# Patient Record
Sex: Female | Born: 1950 | Race: Black or African American | Hispanic: No | Marital: Single | Smoking: Never smoker
Health system: Southern US, Community
[De-identification: ages and names within clinical notes are randomized; demographics above are authoritative.]

## PROBLEM LIST (undated history)

## (undated) DIAGNOSIS — R0989 Other specified symptoms and signs involving the circulatory and respiratory systems: Secondary | ICD-10-CM

## (undated) DIAGNOSIS — E559 Vitamin D deficiency, unspecified: Secondary | ICD-10-CM

## (undated) DIAGNOSIS — I4819 Other persistent atrial fibrillation: Secondary | ICD-10-CM

## (undated) DIAGNOSIS — E0590 Thyrotoxicosis, unspecified without thyrotoxic crisis or storm: Secondary | ICD-10-CM

## (undated) DIAGNOSIS — E079 Disorder of thyroid, unspecified: Secondary | ICD-10-CM

## (undated) DIAGNOSIS — I4891 Unspecified atrial fibrillation: Secondary | ICD-10-CM

## (undated) DIAGNOSIS — I509 Heart failure, unspecified: Secondary | ICD-10-CM

## (undated) DIAGNOSIS — D649 Anemia, unspecified: Secondary | ICD-10-CM

## (undated) DIAGNOSIS — N900 Mild vulvar dysplasia: Secondary | ICD-10-CM

## (undated) HISTORY — DX: Other specified symptoms and signs involving the circulatory and respiratory systems: R09.89

## (undated) HISTORY — DX: Vitamin D deficiency, unspecified: E55.9

## (undated) HISTORY — PX: CATARACT EXTRACTION, BILATERAL: SHX1313

## (undated) HISTORY — PX: TONSILLECTOMY: SUR1361

## (undated) HISTORY — DX: Disorder of thyroid, unspecified: E07.9

## (undated) HISTORY — PX: BREAST SURGERY: SHX581

## (undated) HISTORY — DX: Mild vulvar dysplasia: N90.0

## (undated) HISTORY — PX: BIOPSY THYROID: PRO38

---

## 1995-11-05 HISTORY — PX: ABDOMINAL HYSTERECTOMY: SHX81

## 1999-04-05 DIAGNOSIS — N900 Mild vulvar dysplasia: Secondary | ICD-10-CM

## 1999-04-05 HISTORY — DX: Mild vulvar dysplasia: N90.0

## 1999-04-24 ENCOUNTER — Encounter (INDEPENDENT_AMBULATORY_CARE_PROVIDER_SITE_OTHER): Payer: Self-pay | Admitting: Specialist

## 1999-04-24 ENCOUNTER — Other Ambulatory Visit: Admission: RE | Admit: 1999-04-24 | Discharge: 1999-04-24 | Payer: Self-pay | Admitting: Obstetrics and Gynecology

## 2000-07-31 ENCOUNTER — Other Ambulatory Visit: Admission: RE | Admit: 2000-07-31 | Discharge: 2000-07-31 | Payer: Self-pay | Admitting: Gynecology

## 2001-08-03 ENCOUNTER — Other Ambulatory Visit: Admission: RE | Admit: 2001-08-03 | Discharge: 2001-08-03 | Payer: Self-pay | Admitting: Gynecology

## 2002-08-03 ENCOUNTER — Other Ambulatory Visit: Admission: RE | Admit: 2002-08-03 | Discharge: 2002-08-03 | Payer: Self-pay | Admitting: Gynecology

## 2002-10-06 ENCOUNTER — Ambulatory Visit (HOSPITAL_COMMUNITY): Admission: RE | Admit: 2002-10-06 | Discharge: 2002-10-06 | Payer: Self-pay | Admitting: Internal Medicine

## 2002-10-06 ENCOUNTER — Encounter: Payer: Self-pay | Admitting: Internal Medicine

## 2002-10-22 ENCOUNTER — Encounter: Payer: Self-pay | Admitting: Internal Medicine

## 2002-12-23 ENCOUNTER — Encounter: Payer: Self-pay | Admitting: Endocrinology

## 2002-12-23 ENCOUNTER — Ambulatory Visit (HOSPITAL_COMMUNITY): Admission: RE | Admit: 2002-12-23 | Discharge: 2002-12-23 | Payer: Self-pay | Admitting: Endocrinology

## 2003-03-01 ENCOUNTER — Encounter: Payer: Self-pay | Admitting: Emergency Medicine

## 2003-03-01 ENCOUNTER — Emergency Department (HOSPITAL_COMMUNITY): Admission: EM | Admit: 2003-03-01 | Discharge: 2003-03-01 | Payer: Self-pay | Admitting: Emergency Medicine

## 2003-03-05 ENCOUNTER — Emergency Department (HOSPITAL_COMMUNITY): Admission: EM | Admit: 2003-03-05 | Discharge: 2003-03-05 | Payer: Self-pay | Admitting: Emergency Medicine

## 2003-08-18 ENCOUNTER — Other Ambulatory Visit: Admission: RE | Admit: 2003-08-18 | Discharge: 2003-08-18 | Payer: Self-pay | Admitting: Gynecology

## 2004-04-26 ENCOUNTER — Ambulatory Visit (HOSPITAL_COMMUNITY): Admission: RE | Admit: 2004-04-26 | Discharge: 2004-04-26 | Payer: Self-pay | Admitting: Endocrinology

## 2004-05-10 ENCOUNTER — Ambulatory Visit (HOSPITAL_COMMUNITY): Admission: RE | Admit: 2004-05-10 | Discharge: 2004-05-10 | Payer: Self-pay | Admitting: Endocrinology

## 2004-05-10 ENCOUNTER — Encounter (INDEPENDENT_AMBULATORY_CARE_PROVIDER_SITE_OTHER): Payer: Self-pay | Admitting: *Deleted

## 2004-08-30 ENCOUNTER — Other Ambulatory Visit: Admission: RE | Admit: 2004-08-30 | Discharge: 2004-08-30 | Payer: Self-pay | Admitting: Gynecology

## 2005-05-24 ENCOUNTER — Ambulatory Visit: Payer: Self-pay | Admitting: Endocrinology

## 2005-05-28 ENCOUNTER — Ambulatory Visit (HOSPITAL_COMMUNITY): Admission: RE | Admit: 2005-05-28 | Discharge: 2005-05-28 | Payer: Self-pay | Admitting: Endocrinology

## 2005-05-28 ENCOUNTER — Ambulatory Visit: Payer: Self-pay | Admitting: Internal Medicine

## 2005-06-06 ENCOUNTER — Ambulatory Visit: Payer: Self-pay | Admitting: Internal Medicine

## 2005-06-13 ENCOUNTER — Ambulatory Visit: Payer: Self-pay | Admitting: Internal Medicine

## 2005-06-27 ENCOUNTER — Ambulatory Visit: Payer: Self-pay | Admitting: Internal Medicine

## 2005-09-03 ENCOUNTER — Other Ambulatory Visit: Admission: RE | Admit: 2005-09-03 | Discharge: 2005-09-03 | Payer: Self-pay | Admitting: Gynecology

## 2006-09-29 ENCOUNTER — Other Ambulatory Visit: Admission: RE | Admit: 2006-09-29 | Discharge: 2006-09-29 | Payer: Self-pay | Admitting: Gynecology

## 2007-06-16 ENCOUNTER — Telehealth (INDEPENDENT_AMBULATORY_CARE_PROVIDER_SITE_OTHER): Payer: Self-pay | Admitting: *Deleted

## 2007-06-16 ENCOUNTER — Emergency Department (HOSPITAL_COMMUNITY): Admission: EM | Admit: 2007-06-16 | Discharge: 2007-06-16 | Payer: Self-pay | Admitting: Emergency Medicine

## 2007-06-17 ENCOUNTER — Ambulatory Visit: Payer: Self-pay | Admitting: Internal Medicine

## 2007-06-17 ENCOUNTER — Telehealth (INDEPENDENT_AMBULATORY_CARE_PROVIDER_SITE_OTHER): Payer: Self-pay | Admitting: *Deleted

## 2007-06-17 DIAGNOSIS — D1801 Hemangioma of skin and subcutaneous tissue: Secondary | ICD-10-CM

## 2007-06-17 DIAGNOSIS — R5381 Other malaise: Secondary | ICD-10-CM

## 2007-06-17 DIAGNOSIS — R5383 Other fatigue: Secondary | ICD-10-CM

## 2007-06-24 ENCOUNTER — Ambulatory Visit: Payer: Self-pay | Admitting: Internal Medicine

## 2007-07-01 LAB — CONVERTED CEMR LAB
Basophils Relative: 0.3 % (ref 0.0–1.0)
Eosinophils Absolute: 0.1 10*3/uL (ref 0.0–0.6)
Eosinophils Relative: 1.6 % (ref 0.0–5.0)
Free T4: 0.8 ng/dL (ref 0.6–1.6)
Hemoglobin: 11.5 g/dL — ABNORMAL LOW (ref 12.0–15.0)
Lymphocytes Relative: 26.4 % (ref 12.0–46.0)
MCV: 87.7 fL (ref 78.0–100.0)
Monocytes Absolute: 0.6 10*3/uL (ref 0.2–0.7)
Monocytes Relative: 8.9 % (ref 3.0–11.0)
Neutro Abs: 4.2 10*3/uL (ref 1.4–7.7)
Platelets: 183 10*3/uL (ref 150–400)
TSH: 1.7 microintl units/mL (ref 0.35–5.50)

## 2007-07-02 ENCOUNTER — Encounter (INDEPENDENT_AMBULATORY_CARE_PROVIDER_SITE_OTHER): Payer: Self-pay | Admitting: *Deleted

## 2007-09-15 ENCOUNTER — Encounter: Payer: Self-pay | Admitting: Internal Medicine

## 2007-11-02 ENCOUNTER — Other Ambulatory Visit: Admission: RE | Admit: 2007-11-02 | Discharge: 2007-11-02 | Payer: Self-pay | Admitting: Gynecology

## 2008-02-10 ENCOUNTER — Emergency Department (HOSPITAL_COMMUNITY): Admission: EM | Admit: 2008-02-10 | Discharge: 2008-02-10 | Payer: Self-pay | Admitting: Emergency Medicine

## 2008-03-18 ENCOUNTER — Emergency Department (HOSPITAL_COMMUNITY): Admission: EM | Admit: 2008-03-18 | Discharge: 2008-03-18 | Payer: Self-pay | Admitting: Emergency Medicine

## 2008-03-18 ENCOUNTER — Telehealth (INDEPENDENT_AMBULATORY_CARE_PROVIDER_SITE_OTHER): Payer: Self-pay | Admitting: *Deleted

## 2008-03-21 ENCOUNTER — Telehealth (INDEPENDENT_AMBULATORY_CARE_PROVIDER_SITE_OTHER): Payer: Self-pay | Admitting: *Deleted

## 2008-03-22 ENCOUNTER — Telehealth (INDEPENDENT_AMBULATORY_CARE_PROVIDER_SITE_OTHER): Payer: Self-pay | Admitting: *Deleted

## 2008-03-23 DIAGNOSIS — R209 Unspecified disturbances of skin sensation: Secondary | ICD-10-CM

## 2008-04-29 ENCOUNTER — Telehealth: Payer: Self-pay | Admitting: Internal Medicine

## 2008-05-09 ENCOUNTER — Encounter (INDEPENDENT_AMBULATORY_CARE_PROVIDER_SITE_OTHER): Payer: Self-pay | Admitting: *Deleted

## 2008-08-22 ENCOUNTER — Telehealth (INDEPENDENT_AMBULATORY_CARE_PROVIDER_SITE_OTHER): Payer: Self-pay | Admitting: *Deleted

## 2008-09-19 ENCOUNTER — Encounter: Payer: Self-pay | Admitting: Internal Medicine

## 2008-10-05 ENCOUNTER — Encounter: Payer: Self-pay | Admitting: Internal Medicine

## 2009-04-25 ENCOUNTER — Ambulatory Visit: Payer: Self-pay | Admitting: Internal Medicine

## 2009-04-26 ENCOUNTER — Encounter (INDEPENDENT_AMBULATORY_CARE_PROVIDER_SITE_OTHER): Payer: Self-pay | Admitting: *Deleted

## 2009-05-01 ENCOUNTER — Ambulatory Visit: Payer: Self-pay | Admitting: Internal Medicine

## 2009-05-05 ENCOUNTER — Encounter: Admission: RE | Admit: 2009-05-05 | Discharge: 2009-05-05 | Payer: Self-pay | Admitting: Internal Medicine

## 2009-05-11 LAB — CONVERTED CEMR LAB
ALT: 18 units/L (ref 0–35)
AST: 20 units/L (ref 0–37)
Albumin: 4 g/dL (ref 3.5–5.2)
Alkaline Phosphatase: 50 units/L (ref 39–117)
BUN: 9 mg/dL (ref 6–23)
Basophils Relative: 0.1 % (ref 0.0–3.0)
CO2: 31 meq/L (ref 19–32)
Chloride: 107 meq/L (ref 96–112)
Eosinophils Relative: 1.8 % (ref 0.0–5.0)
GFR calc non Af Amer: 82.32 mL/min (ref >60)
Glucose, Bld: 90 mg/dL (ref 70–99)
HDL: 48.2 mg/dL (ref >39.00)
MCV: 88.8 fL (ref 78.0–100.0)
Monocytes Absolute: 0.5 10*3/uL (ref 0.1–1.0)
Monocytes Relative: 8 % (ref 3.0–12.0)
Neutrophils Relative %: 62 % (ref 43.0–77.0)
Platelets: 171 10*3/uL (ref 150.0–400.0)
Potassium: 3.9 meq/L (ref 3.5–5.1)
RBC: 4.23 M/uL (ref 3.87–5.11)
Sodium: 145 meq/L (ref 135–145)
TSH: 1.98 microintl units/mL (ref 0.35–5.50)
Total CHOL/HDL Ratio: 4
Total Protein: 8.2 g/dL (ref 6.0–8.3)
Triglycerides: 73 mg/dL (ref 0.0–149.0)
WBC: 5.8 10*3/uL (ref 4.5–10.5)

## 2009-05-18 ENCOUNTER — Telehealth (INDEPENDENT_AMBULATORY_CARE_PROVIDER_SITE_OTHER): Payer: Self-pay | Admitting: *Deleted

## 2009-05-30 ENCOUNTER — Encounter (INDEPENDENT_AMBULATORY_CARE_PROVIDER_SITE_OTHER): Payer: Self-pay | Admitting: *Deleted

## 2009-08-24 ENCOUNTER — Ambulatory Visit: Payer: Self-pay | Admitting: Endocrinology

## 2009-08-24 DIAGNOSIS — E039 Hypothyroidism, unspecified: Secondary | ICD-10-CM | POA: Insufficient documentation

## 2009-08-24 DIAGNOSIS — E042 Nontoxic multinodular goiter: Secondary | ICD-10-CM | POA: Insufficient documentation

## 2009-10-12 ENCOUNTER — Encounter: Payer: Self-pay | Admitting: Internal Medicine

## 2009-10-12 ENCOUNTER — Other Ambulatory Visit: Admission: RE | Admit: 2009-10-12 | Discharge: 2009-10-12 | Payer: Self-pay | Admitting: Gynecology

## 2009-10-12 ENCOUNTER — Ambulatory Visit: Payer: Self-pay | Admitting: Gynecology

## 2009-10-16 ENCOUNTER — Encounter: Payer: Self-pay | Admitting: Internal Medicine

## 2009-10-26 ENCOUNTER — Telehealth (INDEPENDENT_AMBULATORY_CARE_PROVIDER_SITE_OTHER): Payer: Self-pay | Admitting: *Deleted

## 2010-03-29 ENCOUNTER — Encounter: Payer: Self-pay | Admitting: Internal Medicine

## 2010-10-15 ENCOUNTER — Ambulatory Visit: Payer: Self-pay | Admitting: Gynecology

## 2010-10-15 ENCOUNTER — Other Ambulatory Visit
Admission: RE | Admit: 2010-10-15 | Discharge: 2010-10-15 | Payer: Self-pay | Source: Home / Self Care | Admitting: Gynecology

## 2010-10-16 ENCOUNTER — Ambulatory Visit: Payer: Self-pay | Admitting: Gynecology

## 2010-10-16 ENCOUNTER — Encounter: Payer: Self-pay | Admitting: Internal Medicine

## 2010-12-13 ENCOUNTER — Encounter: Payer: Self-pay | Admitting: Internal Medicine

## 2011-01-08 ENCOUNTER — Other Ambulatory Visit (INDEPENDENT_AMBULATORY_CARE_PROVIDER_SITE_OTHER): Payer: BC Managed Care – PPO | Admitting: Gynecology

## 2011-01-08 DIAGNOSIS — E559 Vitamin D deficiency, unspecified: Secondary | ICD-10-CM

## 2011-02-04 ENCOUNTER — Encounter: Payer: Self-pay | Admitting: Internal Medicine

## 2011-02-06 ENCOUNTER — Encounter: Payer: Self-pay | Admitting: Internal Medicine

## 2011-07-26 ENCOUNTER — Ambulatory Visit (INDEPENDENT_AMBULATORY_CARE_PROVIDER_SITE_OTHER): Payer: BC Managed Care – PPO | Admitting: Family Medicine

## 2011-07-26 ENCOUNTER — Encounter: Payer: Self-pay | Admitting: Family Medicine

## 2011-07-26 VITALS — BP 120/80 | HR 59 | Temp 98.7°F | Wt 269.4 lb

## 2011-07-26 DIAGNOSIS — J209 Acute bronchitis, unspecified: Secondary | ICD-10-CM

## 2011-07-26 DIAGNOSIS — J40 Bronchitis, not specified as acute or chronic: Secondary | ICD-10-CM

## 2011-07-26 DIAGNOSIS — R062 Wheezing: Secondary | ICD-10-CM

## 2011-07-26 MED ORDER — ALBUTEROL SULFATE (5 MG/ML) 0.5% IN NEBU
2.5000 mg | INHALATION_SOLUTION | Freq: Once | RESPIRATORY_TRACT | Status: AC
  Administered 2011-07-26: 2.5 mg via RESPIRATORY_TRACT

## 2011-07-26 MED ORDER — PREDNISONE 10 MG PO TABS: 10.0000 mg | ORAL_TABLET | Freq: Every day | ORAL | Status: AC

## 2011-07-26 MED ORDER — METHYLPREDNISOLONE ACETATE 80 MG/ML IJ SUSP
80.0000 mg | Freq: Once | INTRAMUSCULAR | Status: AC
  Administered 2011-07-26: 80 mg via INTRAMUSCULAR

## 2011-07-26 MED ORDER — GUAIFENESIN-CODEINE 100-10 MG/5ML PO SYRP: ORAL_SOLUTION | ORAL | Status: DC

## 2011-07-26 MED ORDER — AZITHROMYCIN 250 MG PO TABS: ORAL_TABLET | ORAL | Status: AC

## 2011-07-26 NOTE — Patient Instructions (Signed)
Bronchitis Bronchitis is the body's way of reacting to injury and/or infection (inflammation) of the bronchi. Bronchi are the air tubes that extend from the windpipe into the lungs. If the inflammation becomes severe, it may cause shortness of breath.  CAUSES Inflammation may be caused by:  A virus.   Germs (bacteria).   Dust.   Allergens.   Pollutants and many other irritants.  The cells lining the bronchial tree are covered with tiny hairs (cilia). These constantly beat upward, away from the lungs, toward the mouth. This keeps the lungs free of pollutants. When these cells become too irritated and are unable to do their job, mucus begins to develop. This causes the characteristic cough of bronchitis. The cough clears the lungs when the cilia are unable to do their job. Without either of these protective mechanisms, the mucus would settle in the lungs. Then you would develop pneumonia. Smoking is a common cause of bronchitis and can contribute to pneumonia. Stopping this habit is the single most important thing you can do to help yourself. TREATMENT  Your caregiver may prescribe an antibiotic if the cough is caused by bacteria. Also, medicines that open up your airways make it easier to breathe. Your caregiver may also recommend or prescribe an expectorant. It will loosen the mucus to be coughed up. Only take over-the-counter or prescription medicines for pain, discomfort, or fever as directed by your caregiver.   Removing whatever causes the problem (smoking, for example) is critical to preventing the problem from getting worse.   Cough suppressants may be prescribed for relief of cough symptoms.   Inhaled medicines may be prescribed to help with symptoms now and to help prevent problems from returning.   For those with recurrent (chronic) bronchitis, there may be a need for steroid medicines.  SEEK IMMEDIATE MEDICAL CARE IF:  During treatment, you develop more pus-like mucus  (purulent sputum).   You or your child has an oral temperature above 100.4, not controlled by medicine.   Your baby is older than 3 months with a rectal temperature of 102 F (38.9 C) or higher.   Your baby is 3 months old or younger with a rectal temperature of 100.4 F (38 C) or higher.   You become progressively more ill.   You have increased difficulty breathing, wheezing, or shortness of breath.  It is necessary to seek immediate medical care if you are elderly or sick from any other disease. MAKE SURE YOU:  Understand these instructions.   Will watch your condition.   Will get help right away if you are not doing well or get worse.  Document Released: 10/21/2005 Document Re-Released: 01/15/2010 [employer] Patient Information 2011 [employer]. 

## 2011-07-26 NOTE — Progress Notes (Signed)
  Subjective:     Brittney Walker is a 60 y.o. female here for evaluation of a cough. Onset of symptoms was 1 week ago. Symptoms have been gradually worsening since that time. The cough is productive and is aggravated by reclining position. Associated symptoms include: sputum production and wheezing. Patient does not have a history of asthma. Patient does not have a history of environmental allergens. Patient has not traveled recently. Patient does not have a history of smoking. Patient has not had a previous chest x-ray. Patient has not had a PPD done.  The following portions of the patient's history were reviewed and updated as appropriate: allergies, current medications, past family history, past medical history, past social history, past surgical history and problem list.  Review of Systems Pertinent items are noted in HPI.    Objective:    Oxygen saturation 97% on room air BP 120/80  Pulse 59  Temp(Src) 98.7 F (37.1 C) (Oral)  Wt 269 lb 6.4 oz (122.199 kg)  SpO2 97% General appearance: alert, cooperative, appears stated age and no distress Ears: normal TM's and external ear canals both ears Nose: Nares normal. Septum midline. Mucosa normal. No drainage or sinus tenderness. Throat: lips, mucosa, and tongue normal; teeth and gums normal Neck: no adenopathy and thyroid not enlarged, symmetric, no tenderness/mass/nodules Lungs: diminished breath sounds bilaterally and wheezes bilaterally    Assessment:    Acute Bronchitis    Plan:    Antibiotics per medication orders. Antitussives per medication orders. Avoid exposure to tobacco smoke and fumes. Call if shortness of breath worsens, blood in sputum, change in character of cough, development of fever or chills, inability to maintain nutrition and hydration. Avoid exposure to tobacco smoke and fumes. prednisone taper and depo medrol

## 2011-07-31 LAB — COMPREHENSIVE METABOLIC PANEL
AST: 18
CO2: 31
Calcium: 9.4
Creatinine, Ser: 0.82
GFR calc Af Amer: >60
GFR calc non Af Amer: >60
Sodium: 141
Total Protein: 7.6

## 2011-07-31 LAB — DIFFERENTIAL
Eosinophils Relative: 2
Lymphocytes Relative: 26
Lymphs Abs: 1.8
Monocytes Relative: 8
Neutrophils Relative %: 64

## 2011-07-31 LAB — B-NATRIURETIC PEPTIDE (CONVERTED LAB): Pro B Natriuretic peptide (BNP): 30.6

## 2011-07-31 LAB — CBC
MCHC: 33.7
MCV: 86.7
RBC: 4.21
RDW: 13.8

## 2011-07-31 LAB — POCT CARDIAC MARKERS
CKMB, poc: 1.9
Operator id: 261601
Operator id: 261601
Troponin i, poc: <0.05

## 2011-10-18 ENCOUNTER — Ambulatory Visit (INDEPENDENT_AMBULATORY_CARE_PROVIDER_SITE_OTHER): Payer: BC Managed Care – PPO | Admitting: Gynecology

## 2011-10-18 ENCOUNTER — Encounter: Payer: Self-pay | Admitting: Gynecology

## 2011-10-18 VITALS — BP 138/68 | Ht 67.0 in | Wt 286.0 lb

## 2011-10-18 DIAGNOSIS — Z01419 Encounter for gynecological examination (general) (routine) without abnormal findings: Secondary | ICD-10-CM

## 2011-10-18 NOTE — Progress Notes (Signed)
Brittney Walker 1951-09-03 [id number]        60 y.o.  for annual exam.  Doing well status post TAH in the past.  Past medical history,surgical history, medications, allergies, family history and social history were all reviewed and documented in the EPIC chart. ROS:  Was performed and pertinent positives and negatives are included in the history.  Exam: chaperone present Filed Vitals:   10/18/11 1613  BP: 138/68   General appearance  Normal Skin grossly normal Head/Neck normal with no cervical or supraclavicular adenopathy thyroid normal Lungs  clear Cardiac RR, without RMG Abdominal  soft, nontender, without masses, organomegaly or hernia Breasts  examined lying and sitting without masses, retractions, discharge or axillary adenopathy. Pelvic  Ext/BUS/vagina  normal   Adnexa  Without masses or tenderness    Anus and perineum  normal   Rectovaginal  normal sphincter tone without palpated masses or tenderness.    Assessment/Plan:  60 y.o. female for annual exam.   1. GYN care. Patient is status post TAH in the past. She's doing well without menopausal symptoms and we'll continue to observe. 2. Breast health. Patient has appointment for mammogram 18 December will follow up for this. SBE monthly reviewed. 3. Pap smear. Patient has no history of abnormal Pap smears before with multiple normal records in her chart. Her last Pap was December 2011. Current screening guidelines were reviewed. She is status post hysterectomy and the options for discontinuing altogether or screening at a less frequent interval reviewed. We'll readdress this next year. Pap was not done today. 4. Bone health. DEXA May 2009 is normal. We'll plan on repeating at a five-year interval. She is noted to be low on vitamin D and is taking supplementation. I did not check her vitamin D level today but recommended she have this drawn when she sees her primary for a checkup and she agrees to do so. 5. Colonoscopy. Patient  had colonoscopy 2003.  I recommend a repeat this coming year at a 10 year interval and she will call [facility] to arrange and agrees to do so. 6. Health maintenance. The blood work was done today and she plans to make an appointment to see her primary for a general health maintenance and will have her blood work done there. She definitely needs thyroid follow up and she knows the importance of following up with her primary and agrees to do so. Otherwise from a gynecologic standpoint, assuming she continues well she will see me in a year.    Dara Lords MD, 4:46 PM 10/18/2011

## 2011-10-18 NOTE — Patient Instructions (Signed)
Follow up for your mammogram as scheduled. Make an appointment to see her primary physician for routine exam and blood work. Schedule colonoscopy next year is recommended by gastroenterology at [facility].

## 2011-10-21 ENCOUNTER — Encounter: Payer: Self-pay | Admitting: Gynecology

## 2011-10-24 ENCOUNTER — Other Ambulatory Visit: Payer: Self-pay | Admitting: *Deleted

## 2011-10-24 DIAGNOSIS — R921 Mammographic calcification found on diagnostic imaging of breast: Secondary | ICD-10-CM

## 2011-10-28 ENCOUNTER — Encounter: Payer: Self-pay | Admitting: Internal Medicine

## 2011-11-08 ENCOUNTER — Encounter: Payer: BC Managed Care – PPO | Admitting: Internal Medicine

## 2011-12-20 ENCOUNTER — Encounter: Payer: BC Managed Care – PPO | Admitting: Internal Medicine

## 2012-01-16 ENCOUNTER — Other Ambulatory Visit: Payer: Self-pay | Admitting: *Deleted

## 2012-01-16 DIAGNOSIS — R921 Mammographic calcification found on diagnostic imaging of breast: Secondary | ICD-10-CM

## 2012-01-17 ENCOUNTER — Encounter: Payer: BC Managed Care – PPO | Admitting: Internal Medicine

## 2012-02-07 ENCOUNTER — Other Ambulatory Visit: Payer: Self-pay | Admitting: Gynecology

## 2012-02-07 DIAGNOSIS — R921 Mammographic calcification found on diagnostic imaging of breast: Secondary | ICD-10-CM

## 2012-02-14 ENCOUNTER — Ambulatory Visit (INDEPENDENT_AMBULATORY_CARE_PROVIDER_SITE_OTHER): Payer: BC Managed Care – PPO | Admitting: Internal Medicine

## 2012-02-14 DIAGNOSIS — E559 Vitamin D deficiency, unspecified: Secondary | ICD-10-CM | POA: Insufficient documentation

## 2012-02-14 DIAGNOSIS — Z0000 Encounter for general adult medical examination without abnormal findings: Secondary | ICD-10-CM

## 2012-02-14 DIAGNOSIS — E890 Postprocedural hypothyroidism: Secondary | ICD-10-CM

## 2012-02-14 DIAGNOSIS — E042 Nontoxic multinodular goiter: Secondary | ICD-10-CM

## 2012-02-14 NOTE — Progress Notes (Signed)
  Subjective:    Patient ID: Brittney Walker, female    DOB: 26-Apr-1951, 61 y.o.   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 Note nausea, vomiting, diarrhea or blood in the stools. No dysuria or gross hematuria. No anxiety or depression.     Objective:   Physical Exam General:  alert , overweight appearing and well-developed.   Neck:  Unable to palpate the thyroid today.   Lungs:  normal respiratory effort, no intercostal retractions, no accessory muscle use, and normal breath sounds.   Heart:  normal rate, regular rhythm, and no murmur.   Abdomen:  soft, non-tender, no distention, and no masses.   Extremities:  Trace symmetric pretibial edema. + edema at dorsum of L foot, chronic per patient  Psych:  Cognition and judgment appear intact. Alert and cooperative with normal attention span and concentration.      Assessment & Plan:

## 2012-02-14 NOTE — Assessment & Plan Note (Addendum)
prescribed i-131 rx for hyperthyroidism in 2004.  tsh has been normal since then. pt had bx of the largest nodule in 2005, which was benign. Last thyroid u/s 05-2009-- stable

## 2012-02-14 NOTE — Assessment & Plan Note (Addendum)
See "hypothyroidism."

## 2012-02-14 NOTE — Assessment & Plan Note (Addendum)
Td 2006 zostavax discussed per idx next colon is not due with Dr. Arlyce Dice until 2014 female care per Gyn, up to date on MMG, had DEXA @  gyn per pt (2009)  labs  EKG, nsr, poor progression likely d/t pt's habitus, she is asx diet- exercise encouraged

## 2012-02-14 NOTE — Patient Instructions (Signed)
Exercise 30 minutes a day [employer]? [employer]?

## 2012-02-15 ENCOUNTER — Encounter: Payer: Self-pay | Admitting: Internal Medicine

## 2012-02-15 LAB — CBC WITH DIFFERENTIAL/PLATELET
Basophils Relative: 0 % (ref 0–1)
Eosinophils Absolute: 0.2 10*3/uL (ref 0.0–0.7)
Lymphs Abs: 1.7 10*3/uL (ref 0.7–4.0)
MCH: 29.1 pg (ref 26.0–34.0)
Neutrophils Relative %: 62 % (ref 43–77)
Platelets: 217 10*3/uL (ref 150–400)
RBC: 4.36 MIL/uL (ref 3.87–5.11)

## 2012-02-15 LAB — COMPREHENSIVE METABOLIC PANEL
ALT: 16 U/L (ref 0–35)
CO2: 29 mEq/L (ref 19–32)
Calcium: 9.2 mg/dL (ref 8.4–10.5)
Chloride: 104 mEq/L (ref 96–112)
Creat: 0.82 mg/dL (ref 0.50–1.10)

## 2012-02-15 LAB — TSH: TSH: 1.875 u[IU]/mL (ref 0.350–4.500)

## 2012-02-15 LAB — VITAMIN D 25 HYDROXY (VIT D DEFICIENCY, FRACTURES): Vit D, 25-Hydroxy: 62 ng/mL (ref 30–89)

## 2012-02-15 LAB — LIPID PANEL
LDL Cholesterol: 132 mg/dL — ABNORMAL HIGH (ref 0–99)
Triglycerides: 67 mg/dL (ref <150)
VLDL: 13 mg/dL (ref 0–40)

## 2012-02-20 ENCOUNTER — Encounter: Payer: Self-pay | Admitting: Internal Medicine

## 2012-02-24 ENCOUNTER — Telehealth: Payer: Self-pay | Admitting: Internal Medicine

## 2012-02-24 DIAGNOSIS — D229 Melanocytic nevi, unspecified: Secondary | ICD-10-CM

## 2012-02-24 NOTE — Telephone Encounter (Signed)
Discuss LDL result with Pt. .Please advise on referral.

## 2012-02-24 NOTE — Telephone Encounter (Signed)
Pt also has questions about her LDL results and what she needs to do to lower that.

## 2012-02-24 NOTE — Telephone Encounter (Signed)
Pt would like referral to dermatology for some moles on her face.

## 2012-02-25 NOTE — Telephone Encounter (Signed)
I sent her a letter last week about her results, LDL was good. If she likes a referred to dermatology, please range---> Dx moles

## 2012-02-26 NOTE — Telephone Encounter (Signed)
Discussed with pt, set up referral.

## 2012-05-21 ENCOUNTER — Encounter: Payer: Self-pay | Admitting: Gynecology

## 2012-05-21 ENCOUNTER — Ambulatory Visit (INDEPENDENT_AMBULATORY_CARE_PROVIDER_SITE_OTHER): Payer: BC Managed Care – PPO | Admitting: Gynecology

## 2012-05-21 DIAGNOSIS — R102 Pelvic and perineal pain: Secondary | ICD-10-CM

## 2012-05-21 DIAGNOSIS — N949 Unspecified condition associated with female genital organs and menstrual cycle: Secondary | ICD-10-CM

## 2012-05-21 DIAGNOSIS — N390 Urinary tract infection, site not specified: Secondary | ICD-10-CM

## 2012-05-21 DIAGNOSIS — Z1211 Encounter for screening for malignant neoplasm of colon: Secondary | ICD-10-CM

## 2012-05-21 LAB — URINALYSIS W MICROSCOPIC + REFLEX CULTURE
Hgb urine dipstick: NEGATIVE
Leukocytes, UA: NEGATIVE
Nitrite: NEGATIVE
Protein, ur: NEGATIVE mg/dL
pH: 5 (ref 5.0–8.0)

## 2012-05-21 MED ORDER — NITROFURANTOIN MONOHYD MACRO 100 MG PO CAPS: 100.0000 mg | ORAL_CAPSULE | Freq: Two times a day (BID) | ORAL | Status: AC

## 2012-05-21 NOTE — Progress Notes (Signed)
Patient presents complaining of suprapubic lower, discomfort over the last several months. Comes and goes. No nausea vomiting diarrhea constipation. Good appetite with regular bowel movements. No dysuria frequency urgency. No low back pain or referred pain. Status post abdominal hysterectomy in the past for leiomyoma.  No vaginal bleeding, rectal bleeding or other symptoms.  Exam was can assist Spine straight no CVA tenderness. Abdomen with active bowel sounds soft nontender without masses guarding rebound organomegaly. She's pointing to the suprapubic region his first her discomfort. Pelvic external BUS vagina normal. Bimanual without masses or tenderness. Rectovaginal exam is normal. Stool negative for blood.  Assessment and plan. Suprapubic discomfort. Urinalysis suggestive of low-grade UTI with 3-6 WBC 0-2 RBC few bacteria. Will check culture. We'll treat with Macrobid 100 twice a day x7 days. Recommend baseline ultrasound as pelvic exam is somewhat hindered by abdominal girth to clear ovaries. Differential to include GI such as diverticulitis or other GI pathology was also reviewed with her. If pain resolves/ultrasound negative will follow. If pain persists may consider referral to gastroenterology. If ultrasound is normal then we'll address.

## 2012-05-21 NOTE — Patient Instructions (Signed)
Take antibiotics twice daily for 7 days. Follow for GYN ultrasound as scheduled in several weeks.

## 2012-05-23 LAB — URINE CULTURE: Colony Count: >=100000

## 2012-06-03 ENCOUNTER — Other Ambulatory Visit: Payer: BC Managed Care – PPO

## 2012-06-03 ENCOUNTER — Ambulatory Visit: Payer: BC Managed Care – PPO | Admitting: Gynecology

## 2012-06-04 ENCOUNTER — Ambulatory Visit (INDEPENDENT_AMBULATORY_CARE_PROVIDER_SITE_OTHER): Payer: BC Managed Care – PPO

## 2012-06-04 ENCOUNTER — Encounter: Payer: Self-pay | Admitting: Gynecology

## 2012-06-04 ENCOUNTER — Ambulatory Visit (INDEPENDENT_AMBULATORY_CARE_PROVIDER_SITE_OTHER): Payer: BC Managed Care – PPO | Admitting: Gynecology

## 2012-06-04 DIAGNOSIS — R102 Pelvic and perineal pain: Secondary | ICD-10-CM

## 2012-06-04 DIAGNOSIS — N949 Unspecified condition associated with female genital organs and menstrual cycle: Secondary | ICD-10-CM

## 2012-06-04 NOTE — Progress Notes (Signed)
Patient presents for ultrasound. She had a history of suprapubic/pelvic pain. Was treated with antibiotics for UTI and her pain has resolved.  Ultrasound shows vaginal cuff normal with bilateral postmenopausal ovaries. No free fluid.  Assessment and plan: Lower pelvic/suprapubic discomfort now resolved. We'll continue to monitor. Follow up if recurs. Ultrasound is normal. Follow up at the end of year when she is due for her annual exam.

## 2012-06-04 NOTE — Patient Instructions (Signed)
Follow up for annual exam at the end of the year.

## 2012-06-09 ENCOUNTER — Encounter: Payer: Self-pay | Admitting: Gynecology

## 2012-10-20 ENCOUNTER — Ambulatory Visit (INDEPENDENT_AMBULATORY_CARE_PROVIDER_SITE_OTHER): Payer: BC Managed Care – PPO | Admitting: Gynecology

## 2012-10-20 ENCOUNTER — Encounter: Payer: Self-pay | Admitting: Gynecology

## 2012-10-20 VITALS — BP 108/76 | Ht 64.0 in | Wt 275.0 lb

## 2012-10-20 DIAGNOSIS — Z01419 Encounter for gynecological examination (general) (routine) without abnormal findings: Secondary | ICD-10-CM

## 2012-10-20 NOTE — Progress Notes (Signed)
Brittney Walker 1951-08-11 [id number]        61 y.o.  G0P0 for annual exam.  Doing well without complaints.  Past medical history,surgical history, medications, allergies, family history and social history were all reviewed and documented in the EPIC chart. ROS:  Was performed and pertinent positives and negatives are included in the history.  Exam: Brittney Walker assistant Filed Vitals:   10/20/12 1206  BP: 108/76  Height: 5\' 4"  (1.626 m)  Weight: 275 lb (124.739 kg)   General appearance  Normal Skin grossly normal Head/Neck normal with no cervical or supraclavicular adenopathy thyroid normal Lungs  clear Cardiac RR, without RMG Abdominal  soft, nontender, without masses, organomegaly or hernia Breasts  examined lying and sitting without masses, retractions, discharge or axillary adenopathy. Pelvic  Ext/BUS/vagina  normal with mild atrophic changes  Adnexa  Without masses or tenderness    Anus and perineum  normal   Rectovaginal  normal sphincter tone without palpated masses or tenderness.    Assessment/Plan:  61 y.o. G0P0 female for annual exam.   1. Postmenopausal. Doing well without significant symptoms. We'll continue to monitor. 2. History VIN-I 2000. Exam is normal and we'll continue expected management with annual exams. 3. Pap smear 10/2010. No Pap smear done today. No history of significant abnormal Pap smears previously and is status post TAH for leiomyoma.  Will readdress less frequent screening versus stopping altogether on an annual basis. 4. Mammography 02/2012. We'll continue with annual mammography. SBE monthly reviewed. 5. Colonoscopy due this coming year and she knows to schedule. 6. DEXA 03/2008 normal. Plan repeat next year at the 5 year interval. 7. Health maintenance. The blood work done as it was all done through Dr. Leta Walker office. Follow up one year, sooner as needed.    Brittney Lords MD, 12:55 PM 10/20/2012

## 2012-10-20 NOTE — Patient Instructions (Signed)
Followup in one year for annual exam, sooner if any issues 

## 2012-10-21 LAB — URINALYSIS W MICROSCOPIC + REFLEX CULTURE
Bilirubin Urine: NEGATIVE
Casts: NONE SEEN
Hgb urine dipstick: NEGATIVE
Ketones, ur: NEGATIVE mg/dL
Specific Gravity, Urine: 1.017 (ref 1.005–1.030)
Urobilinogen, UA: 0.2 mg/dL (ref 0.0–1.0)

## 2013-08-11 ENCOUNTER — Encounter: Payer: Self-pay | Admitting: Gastroenterology

## 2013-09-20 ENCOUNTER — Ambulatory Visit (AMBULATORY_SURGERY_CENTER): Payer: Self-pay

## 2013-09-20 VITALS — Ht 66.5 in | Wt 260.0 lb

## 2013-09-20 DIAGNOSIS — Z860100 Personal history of colon polyps, unspecified: Secondary | ICD-10-CM

## 2013-09-20 MED ORDER — SUPREP BOWEL PREP KIT 17.5-3.13-1.6 GM/177ML PO SOLN: 1.0000 | Freq: Once | ORAL | Status: DC

## 2013-09-22 ENCOUNTER — Encounter: Payer: Self-pay | Admitting: Gastroenterology

## 2013-10-06 ENCOUNTER — Encounter: Payer: BC Managed Care – PPO | Admitting: Gastroenterology

## 2013-11-30 ENCOUNTER — Telehealth: Payer: Self-pay | Admitting: Gastroenterology

## 2013-11-30 NOTE — Telephone Encounter (Signed)
no

## 2013-12-01 ENCOUNTER — Encounter: Payer: BC Managed Care – PPO | Admitting: Gastroenterology

## 2013-12-27 ENCOUNTER — Encounter: Payer: Self-pay | Admitting: Gastroenterology

## 2013-12-27 NOTE — Telephone Encounter (Signed)
Prep out front for pt/ But its Prep o pik

## 2013-12-29 ENCOUNTER — Encounter: Payer: Self-pay | Admitting: Gastroenterology

## 2013-12-29 ENCOUNTER — Ambulatory Visit (AMBULATORY_SURGERY_CENTER): Payer: BC Managed Care – PPO | Admitting: Gastroenterology

## 2013-12-29 VITALS — BP 117/61 | HR 52 | Temp 97.5°F | Resp 16 | Ht 66.5 in | Wt 260.0 lb

## 2013-12-29 DIAGNOSIS — K648 Other hemorrhoids: Secondary | ICD-10-CM

## 2013-12-29 DIAGNOSIS — Z86010 Personal history of colonic polyps: Secondary | ICD-10-CM

## 2013-12-29 DIAGNOSIS — K5730 Diverticulosis of large intestine without perforation or abscess without bleeding: Secondary | ICD-10-CM

## 2013-12-29 MED ORDER — SODIUM CHLORIDE 0.9 % IV SOLN: 500.0000 mL | INTRAVENOUS | Status: DC

## 2013-12-29 NOTE — Patient Instructions (Signed)
YOU HAD AN ENDOSCOPIC PROCEDURE TODAY AT THE  ENDOSCOPY CENTER: Refer to the procedure report that was given to you for any specific questions about what was found during the examination.  If the procedure report does not answer your questions, please call your gastroenterologist to clarify.  If you requested that your care partner not be given the details of your procedure findings, then the procedure report has been included in a sealed envelope for you to review at your convenience later.  YOU SHOULD EXPECT: Some feelings of bloating in the abdomen. Passage of more gas than usual.  Walking can help get rid of the air that was put into your GI tract during the procedure and reduce the bloating. If you had a lower endoscopy (such as a colonoscopy or flexible sigmoidoscopy) you may notice spotting of blood in your stool or on the toilet paper. If you underwent a bowel prep for your procedure, then you may not have a normal bowel movement for a few days.  DIET: Your first meal following the procedure should be a light meal and then it is ok to progress to your normal diet.  A half-sandwich or bowl of soup is an example of a good first meal.  Heavy or fried foods are harder to digest and may make you feel nauseous or bloated.  Likewise meals heavy in dairy and vegetables can cause extra gas to form and this can also increase the bloating.  Drink plenty of fluids but you should avoid alcoholic beverages for 24 hours.  ACTIVITY: Your care partner should take you home directly after the procedure.  You should plan to take it easy, moving slowly for the rest of the day.  You can resume normal activity the day after the procedure however you should NOT DRIVE or use heavy machinery for 24 hours (because of the sedation medicines used during the test).    SYMPTOMS TO REPORT IMMEDIATELY: A gastroenterologist can be reached at any hour.  During normal business hours, 8:30 AM to 5:00 PM Monday through Friday,  call [phone].  After hours and on weekends, please call the GI answering service at [phone] who will take a message and have the physician on call contact you.   Following lower endoscopy (colonoscopy or flexible sigmoidoscopy):  Excessive amounts of blood in the stool  Significant tenderness or worsening of abdominal pains  Swelling of the abdomen that is new, acute  Fever of 100F or higher  FOLLOW UP: If any biopsies were taken you will be contacted by phone or by letter within the next 1-3 weeks.  Call your gastroenterologist if you have not heard about the biopsies in 3 weeks.  Our staff will call the home number listed on your records the next business day following your procedure to check on you and address any questions or concerns that you may have at that time regarding the information given to you following your procedure. This is a courtesy call and so if there is no answer at the home number and we have not heard from you through the emergency physician on call, we will assume that you have returned to your regular daily activities without incident.  SIGNATURES/CONFIDENTIALITY: You and/or your care partner have signed paperwork which will be entered into your electronic medical record.  These signatures attest to the fact that that the information above on your After Visit Summary has been reviewed and is understood.  Full responsibility of the confidentiality of this   discharge information lies with you and/or your care-partner.  Resume medications. Information given on diverticulosis, hemorrhoids and high fiber diet with discharge instructions sealed.

## 2013-12-29 NOTE — Progress Notes (Signed)
Pt ate 4 oz of steak with grilled onions and 1/2 of a baked potatoe yesterday at 12:00.  She also drank a sip of water at 12:00 today.  Tilman Neat, CRNA and Dr. Erskine Emery was made aware of this.  Dr. Deatra Ina said they would proceed with colonoscopy. Maw

## 2013-12-29 NOTE — Progress Notes (Signed)
Lidocaine-40mg IV prior to Propofol InductionPropofol given over incremental dosages 

## 2013-12-29 NOTE — Op Note (Signed)
[facility]  [address]. [place], [address]   COLONOSCOPY PROCEDURE REPORT  PATIENT: Brittney Walker, Brittney Walker.  MR#: [id number] BIRTHDATE: 15-Dec-1950 , 63  yrs. old GENDER: Female ENDOSCOPIST: Inda Castle, MD REFERRED BY: PROCEDURE DATE:  12/29/2013 PROCEDURE:   Colonoscopy, diagnostic First Screening Colonoscopy - Avg.  risk and is 50 yrs.  old or older - No.  Prior Negative Screening - Now for repeat screening. 10 or more years since last screening  History of Adenoma - Now for follow-up colonoscopy & has been > or = to 3 yrs.  N/A  Polyps Removed Today? No.  Recommend repeat exam, <10 yrs? No. ASA CLASS:   Class II INDICATIONS:Average risk patient for colon cancer. MEDICATIONS: MAC sedation, administered by CRNA and propofol (Diprivan) 200mg  IV  DESCRIPTION OF PROCEDURE:   After the risks benefits and alternatives of the procedure were thoroughly explained, informed consent was obtained.  A digital rectal exam revealed no abnormalities of the rectum.   The LB [id number]  endoscope was introduced through the anus and advanced to the cecum, which was identified by both the appendix and ileocecal valve. No adverse events experienced.   The quality of the prep was excellent using Suprep  The instrument was then slowly withdrawn as the colon was fully examined.      COLON FINDINGS: Mild diverticulosis was noted in the sigmoid colon. Internal hemorrhoids were found.   The colon was otherwise normal. There was no diverticulosis, inflammation, polyps or cancers unless previously stated.  Retroflexed views revealed internal hemorrhoids. The time to cecum=2 minutes 58 seconds.  Withdrawal time=7 minutes 15 seconds.  The scope was withdrawn and the procedure completed. COMPLICATIONS: There were no complications.  ENDOSCOPIC IMPRESSION: 1.   Mild diverticulosis was noted in the sigmoid colon 2.   Internal hemorrhoids 3.   The colon was otherwise  normal  RECOMMENDATIONS: Continue current colorectal screening recommendations for "routine risk" patients with a repeat colonoscopy in 10 years.   eSigned:  Inda Castle, MD 12/29/2013 2:25 PM   cc: Kathlene November, MD and Marylynn Pearson MD   PATIENT NAME:  Brittney Walker, Brittney Walker. MR#: [id number]

## 2013-12-31 ENCOUNTER — Telehealth: Payer: Self-pay | Admitting: *Deleted

## 2013-12-31 NOTE — Telephone Encounter (Signed)
  Follow up Call-  Call back number 12/29/2013  Post procedure Call Back phone  # [phone] hm  Permission to leave phone message Yes      No answer at #given.  Message left on VM.

## 2014-05-21 ENCOUNTER — Emergency Department (HOSPITAL_COMMUNITY)
Admission: EM | Admit: 2014-05-21 | Discharge: 2014-05-21 | Disposition: A | Discharge status: Home / Self Care | Payer: BC Managed Care – PPO | Attending: Emergency Medicine | Admitting: Emergency Medicine

## 2014-05-21 ENCOUNTER — Emergency Department (HOSPITAL_COMMUNITY): Payer: BC Managed Care – PPO

## 2014-05-21 ENCOUNTER — Encounter (HOSPITAL_COMMUNITY): Payer: Self-pay | Admitting: Emergency Medicine

## 2014-05-21 DIAGNOSIS — S0990XA Unspecified injury of head, initial encounter: Secondary | ICD-10-CM | POA: Insufficient documentation

## 2014-05-21 DIAGNOSIS — Z8639 Personal history of other endocrine, nutritional and metabolic disease: Secondary | ICD-10-CM | POA: Insufficient documentation

## 2014-05-21 DIAGNOSIS — S1093XA Contusion of unspecified part of neck, initial encounter: Principal | ICD-10-CM

## 2014-05-21 DIAGNOSIS — R51 Headache: Secondary | ICD-10-CM

## 2014-05-21 DIAGNOSIS — S0003XA Contusion of scalp, initial encounter: Secondary | ICD-10-CM | POA: Insufficient documentation

## 2014-05-21 DIAGNOSIS — IMO0002 Reserved for concepts with insufficient information to code with codable children: Secondary | ICD-10-CM | POA: Insufficient documentation

## 2014-05-21 DIAGNOSIS — S0033XA Contusion of nose, initial encounter: Secondary | ICD-10-CM

## 2014-05-21 DIAGNOSIS — Z862 Personal history of diseases of the blood and blood-forming organs and certain disorders involving the immune mechanism: Secondary | ICD-10-CM | POA: Insufficient documentation

## 2014-05-21 DIAGNOSIS — Y9389 Activity, other specified: Secondary | ICD-10-CM | POA: Insufficient documentation

## 2014-05-21 DIAGNOSIS — Z79899 Other long term (current) drug therapy: Secondary | ICD-10-CM | POA: Insufficient documentation

## 2014-05-21 DIAGNOSIS — S0083XA Contusion of other part of head, initial encounter: Principal | ICD-10-CM | POA: Insufficient documentation

## 2014-05-21 DIAGNOSIS — R519 Headache, unspecified: Secondary | ICD-10-CM

## 2014-05-21 DIAGNOSIS — Y9229 Other specified public building as the place of occurrence of the external cause: Secondary | ICD-10-CM | POA: Insufficient documentation

## 2014-05-21 DIAGNOSIS — Z8742 Personal history of other diseases of the female genital tract: Secondary | ICD-10-CM | POA: Insufficient documentation

## 2014-05-21 MED ORDER — IBUPROFEN 200 MG PO TABS
600.0000 mg | ORAL_TABLET | Freq: Once | ORAL | Status: AC
  Administered 2014-05-21: 600 mg via ORAL
  Filled 2014-05-21: qty 3
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
## 2014-05-21 NOTE — ED Notes (Signed)
Patient transported to X-ray 
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
## 2014-05-22 NOTE — ED Provider Notes (Signed)
Medical screening examination/treatment/procedure(s) were performed by non-physician practitioner and as supervising physician I was immediately available for consultation/collaboration.    Inika Bellanger L Brandonn Capelli, MD 05/22/14 0706 

## 2014-10-13 ENCOUNTER — Encounter: Payer: BC Managed Care – PPO | Admitting: Internal Medicine

## 2014-10-14 ENCOUNTER — Encounter: Payer: Self-pay | Admitting: Internal Medicine

## 2014-10-14 ENCOUNTER — Ambulatory Visit (INDEPENDENT_AMBULATORY_CARE_PROVIDER_SITE_OTHER): Payer: BC Managed Care – PPO | Admitting: Internal Medicine

## 2014-10-14 ENCOUNTER — Ambulatory Visit (INDEPENDENT_AMBULATORY_CARE_PROVIDER_SITE_OTHER): Payer: BC Managed Care – PPO

## 2014-10-14 VITALS — BP 139/79 | HR 82 | Temp 98.2°F | Wt 274.2 lb

## 2014-10-14 DIAGNOSIS — E039 Hypothyroidism, unspecified: Secondary | ICD-10-CM

## 2014-10-14 DIAGNOSIS — E042 Nontoxic multinodular goiter: Secondary | ICD-10-CM

## 2014-10-14 DIAGNOSIS — Z0000 Encounter for general adult medical examination without abnormal findings: Secondary | ICD-10-CM

## 2014-10-14 DIAGNOSIS — Z23 Encounter for immunization: Secondary | ICD-10-CM

## 2014-10-14 NOTE — Patient Instructions (Signed)
Stop by the front desk and schedule labs to be done within few days (fasting)    Please come back to the office  In 1 year for a physical exam. Come back fasting

## 2014-10-14 NOTE — Progress Notes (Signed)
Pre visit review using our clinic review tool, if applicable. No additional management support is needed unless otherwise documented below in the visit note. 

## 2014-10-14 NOTE — Progress Notes (Signed)
Subjective:    Patient ID: Brittney Walker, female    DOB: 1951-07-21, 63 y.o.   MRN: [id number]  DOS:  10/14/2014 Type of visit - description : cpx Interval history: Feels well    ROS Denies chest pain or difficulty breathing No nausea, vomiting, diarrhea or blood in the stools No cough, sputum production or wheezing. No anxiety or depression  Past Medical History  Diagnosis Date  . Thyroid disease     Hyperthyroidism s/p RAI  . Carotid bruit   . 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 Breast surgery      x's 2--fibrocystic dz  . Tonsillectomy    . Abdominal hysterectomy  1997    TAH-LEIOMYOMATA-- no BSO  . Biopsy thyroid       bx of largest nodule 2005, benign    History   Social History  . Marital Status: Single    Spouse Name: N/A    Number of Children: 0  . Years of Education: N/A   Occupational History  . collections     Social History Main Topics  . Smoking status: Never Smoker   . Smokeless tobacco: Never Used  . Alcohol Use: No  . Drug Use: No  . Sexual Activity: No   Other Topics Concern  . Not on file   Social History Narrative   Lives by herself    Family History  Problem Relation Age of Onset  . Seizures Father     Epilepsy  . Hypertension Father   . Heart attack Father     ?  Marland Kitchen Thyroid disease Other     uncle  . Breast cancer Sister   . Colon cancer Neg Hx   . Pancreatic cancer Neg Hx   . Esophageal cancer Neg Hx   . Prostate cancer Neg Hx   . Rectal cancer Neg Hx         Medication List       This list is accurate as of: 10/14/14 11:59 PM.  Always use your most recent med list.               calcium carbonate 1250 MG tablet  Commonly known as:  OS-CAL - dosed in mg of elemental calcium  Take 1 tablet by mouth daily with breakfast.     cholecalciferol 1000 UNITS tablet  Commonly known as:  VITAMIN D  Take 1,000 Units by mouth daily.     multivitamin capsule  Take 1 capsule by mouth daily.     vitamin A 10000 UNIT capsule  Take 10,000 Units by mouth daily.     vitamin B-12 1000 MCG tablet  Commonly known as:  CYANOCOBALAMIN  Take 1,000 mcg by mouth daily.     vitamin C 500 MG tablet  Commonly known as:  ASCORBIC ACID  Take 1,000 mg by mouth daily.     vitamin E 400 UNIT capsule  Take 400 Units by mouth daily.           Objective:   Physical Exam BP 139/79 mmHg  Pulse 82  Temp(Src) 98.2 F (36.8 C) (Oral)  Wt 274 lb 4 oz (124.399 kg)  SpO2 100%  LMP 11/05/1995 General -- alert, well-developed, NAD.  Neck --+ thyromegaly, R sided, no TTP , no LAD HEENT-- Not pale.  Lungs -- normal respiratory effort, no intercostal retractions, no accessory muscle use, and normal breath sounds.  Heart-- normal  rate, regular rhythm, no murmur.  Abdomen-- Not distended, good bowel sounds,soft, non-tender. Extremities-- trace  pretibial edema bilaterally  Neurologic--  alert & oriented X3. Speech normal, gait appropriate for age, strength symmetric and appropriate for age.  Psych-- Cognition and judgment appear intact. Cooperative with normal attention span and concentration. No anxious or depressed appearing.        Assessment & Plan:

## 2014-10-15 ENCOUNTER — Encounter: Payer: Self-pay | Admitting: Internal Medicine

## 2014-10-15 NOTE — Assessment & Plan Note (Signed)
History of hyperthyroidism and thyroid nodule, s/p ablation  No recent evaluation, check labs and ultrasound

## 2014-10-15 NOTE — Assessment & Plan Note (Signed)
See hupothyroidism

## 2014-10-15 NOTE — Assessment & Plan Note (Signed)
Td 2006 Flu shot today Had a colonoscopy 10-2014, negative, next 63 years Female care per Dr.Fontaine, recommend to call and make an appointment Last mammogram on record 2013 negative. Referral for a mammogram.

## 2014-10-18 ENCOUNTER — Other Ambulatory Visit: Payer: BC Managed Care – PPO

## 2014-10-19 ENCOUNTER — Ambulatory Visit (HOSPITAL_BASED_OUTPATIENT_CLINIC_OR_DEPARTMENT_OTHER): Payer: BC Managed Care – PPO

## 2014-10-19 ENCOUNTER — Ambulatory Visit (HOSPITAL_BASED_OUTPATIENT_CLINIC_OR_DEPARTMENT_OTHER)
Admission: RE | Admit: 2014-10-19 | Discharge: 2014-10-19 | Disposition: A | Discharge status: Home / Self Care | Payer: BC Managed Care – PPO | Source: Ambulatory Visit | Attending: Internal Medicine | Admitting: Internal Medicine

## 2014-10-19 DIAGNOSIS — E041 Nontoxic single thyroid nodule: Secondary | ICD-10-CM | POA: Insufficient documentation

## 2014-10-19 DIAGNOSIS — E042 Nontoxic multinodular goiter: Secondary | ICD-10-CM

## 2014-10-20 ENCOUNTER — Other Ambulatory Visit: Payer: BC Managed Care – PPO

## 2014-10-21 ENCOUNTER — Other Ambulatory Visit: Payer: Self-pay | Admitting: Internal Medicine

## 2014-10-21 DIAGNOSIS — E042 Nontoxic multinodular goiter: Secondary | ICD-10-CM

## 2014-10-24 ENCOUNTER — Telehealth: Payer: Self-pay

## 2014-10-24 NOTE — Telephone Encounter (Signed)
-----   Message from Brittney Walker, [place] sent at 10/21/2014 11:23 AM EST ----- Regarding: Thyroid Bx Sent letter on 10/21/2014, informing Pt to contact office regarding thyroid US. GET ACTIVE NUMBER!

## 2014-10-24 NOTE — Telephone Encounter (Signed)
Pt has appt scheduled for Thyroid Bx on 11/15/2014 at 11:30. Active phone number has been placed in chart.

## 2014-10-25 ENCOUNTER — Other Ambulatory Visit: Payer: BC Managed Care – PPO

## 2014-10-27 ENCOUNTER — Other Ambulatory Visit (INDEPENDENT_AMBULATORY_CARE_PROVIDER_SITE_OTHER): Payer: BC Managed Care – PPO

## 2014-10-27 DIAGNOSIS — Z0000 Encounter for general adult medical examination without abnormal findings: Secondary | ICD-10-CM

## 2014-10-27 LAB — LIPID PANEL
CHOLESTEROL: 208 mg/dL — AB (ref 0–200)
HDL: 46.3 mg/dL (ref >39.00)
LDL Cholesterol: 145 mg/dL — ABNORMAL HIGH (ref 0–99)
NonHDL: 161.7
Total CHOL/HDL Ratio: 4
Triglycerides: 86 mg/dL (ref 0.0–149.0)
VLDL: 17.2 mg/dL (ref 0.0–40.0)

## 2014-10-27 LAB — VITAMIN D 25 HYDROXY (VIT D DEFICIENCY, FRACTURES): VITD: 23.35 ng/mL — ABNORMAL LOW (ref 30.00–100.00)

## 2014-10-27 LAB — COMPREHENSIVE METABOLIC PANEL
ALT: 26 U/L (ref 0–35)
AST: 25 U/L (ref 0–37)
Albumin: 3.7 g/dL (ref 3.5–5.2)
Alkaline Phosphatase: 51 U/L (ref 39–117)
BUN: 10 mg/dL (ref 6–23)
CO2: 27 meq/L (ref 19–32)
Calcium: 9.1 mg/dL (ref 8.4–10.5)
Chloride: 107 mEq/L (ref 96–112)
Creatinine, Ser: 0.8 mg/dL (ref 0.4–1.2)
GFR: 90.02 mL/min (ref >60.00)
Glucose, Bld: 111 mg/dL — ABNORMAL HIGH (ref 70–99)
Potassium: 3.9 mEq/L (ref 3.5–5.1)
SODIUM: 140 meq/L (ref 135–145)
TOTAL PROTEIN: 7.4 g/dL (ref 6.0–8.3)
Total Bilirubin: 0.8 mg/dL (ref 0.2–1.2)

## 2014-10-27 LAB — CBC WITH DIFFERENTIAL/PLATELET
Basophils Absolute: 0 10*3/uL (ref 0.0–0.1)
Basophils Relative: 0.4 % (ref 0.0–3.0)
EOS PCT: 2.3 % (ref 0.0–5.0)
Eosinophils Absolute: 0.1 10*3/uL (ref 0.0–0.7)
HEMATOCRIT: 37.8 % (ref 36.0–46.0)
Hemoglobin: 12.5 g/dL (ref 12.0–15.0)
LYMPHS ABS: 1.9 10*3/uL (ref 0.7–4.0)
Lymphocytes Relative: 30.7 % (ref 12.0–46.0)
MCHC: 33 g/dL (ref 30.0–36.0)
MCV: 89.3 fl (ref 78.0–100.0)
MONOS PCT: 7.6 % (ref 3.0–12.0)
Monocytes Absolute: 0.5 10*3/uL (ref 0.1–1.0)
NEUTROS PCT: 59 % (ref 43.0–77.0)
Neutro Abs: 3.6 10*3/uL (ref 1.4–7.7)
PLATELETS: 151 10*3/uL (ref 150.0–400.0)
RBC: 4.24 Mil/uL (ref 3.87–5.11)
RDW: 13.9 % (ref 11.5–15.5)
WBC: 6 10*3/uL (ref 4.0–10.5)

## 2014-10-27 LAB — PSA: PSA: 0 ng/mL — ABNORMAL LOW (ref 0.10–4.00)

## 2014-10-27 LAB — TSH: TSH: 2.11 u[IU]/mL (ref 0.35–4.50)

## 2014-11-01 MED ORDER — VITAMIN D (ERGOCALCIFEROL) 1.25 MG (50000 UNIT) PO CAPS: 50000.0000 [IU] | ORAL_CAPSULE | ORAL | Status: DC

## 2014-11-15 ENCOUNTER — Telehealth: Payer: Self-pay | Admitting: Internal Medicine

## 2014-11-15 ENCOUNTER — Inpatient Hospital Stay: Admission: RE | Admit: 2014-11-15 | Payer: BC Managed Care – PPO | Source: Ambulatory Visit

## 2014-11-15 NOTE — Telephone Encounter (Signed)
Caller name: [place], [place] Relation to pt: self  Call back number: [phone] Pharmacy:  Reason for call:   Pt states she was sent a letter regarding her blood work and in need of clarification.

## 2014-11-16 NOTE — Telephone Encounter (Signed)
Spoke with Pt informed her of lab results. Pt informed me she picked up Ergocalciferol at pharmacy for vit D. Pt questioned me about how to lower glucose and cholesterol levels, informed her to watch sugar intake and fried foods and saturated fat intake. Pt verbalized understanding.

## 2014-11-29 ENCOUNTER — Encounter: Payer: Self-pay | Admitting: Gynecology

## 2014-12-01 ENCOUNTER — Inpatient Hospital Stay: Admission: RE | Admit: 2014-12-01 | Payer: Self-pay | Source: Ambulatory Visit

## 2014-12-07 ENCOUNTER — Encounter: Payer: Self-pay | Admitting: Gynecology

## 2014-12-13 ENCOUNTER — Other Ambulatory Visit (HOSPITAL_COMMUNITY)
Admission: RE | Admit: 2014-12-13 | Discharge: 2014-12-13 | Disposition: A | Discharge status: Home / Self Care | Payer: BLUE CROSS/BLUE SHIELD | Source: Ambulatory Visit | Attending: Interventional Radiology | Admitting: Interventional Radiology

## 2014-12-13 ENCOUNTER — Ambulatory Visit
Admission: RE | Admit: 2014-12-13 | Discharge: 2014-12-13 | Disposition: A | Discharge status: Home / Self Care | Payer: BLUE CROSS/BLUE SHIELD | Source: Ambulatory Visit | Attending: Internal Medicine | Admitting: Internal Medicine

## 2014-12-13 DIAGNOSIS — E042 Nontoxic multinodular goiter: Secondary | ICD-10-CM

## 2014-12-13 DIAGNOSIS — E041 Nontoxic single thyroid nodule: Secondary | ICD-10-CM | POA: Diagnosis not present

## 2014-12-15 ENCOUNTER — Other Ambulatory Visit: Payer: Self-pay

## 2014-12-15 ENCOUNTER — Telehealth: Payer: Self-pay | Admitting: Internal Medicine

## 2014-12-15 DIAGNOSIS — E049 Nontoxic goiter, unspecified: Secondary | ICD-10-CM

## 2014-12-15 NOTE — Telephone Encounter (Signed)
Spoke with Pt she informed me she had thyroid biopsy repeated on 12/13/2014 from December. Does thyroid bx need to be repeated for a third time? Please advise.

## 2014-12-15 NOTE — Telephone Encounter (Signed)
Caller name: Brittney Walker, [place] Relation to pt: self  Call back number: [phone]   Reason for call:  Imaging contacted pt today to schedule another appointment regarding pt thryoid pt states she just had one Tuesday 12/13/14. Pt would like to know if another imaging appointment is necessary. Please advise

## 2014-12-16 ENCOUNTER — Ambulatory Visit: Payer: BLUE CROSS/BLUE SHIELD | Admitting: Internal Medicine

## 2014-12-16 ENCOUNTER — Emergency Department (HOSPITAL_COMMUNITY)
Admission: EM | Admit: 2014-12-16 | Discharge: 2014-12-16 | Disposition: A | Discharge status: Home / Self Care | Payer: BLUE CROSS/BLUE SHIELD | Attending: Emergency Medicine | Admitting: Emergency Medicine

## 2014-12-16 ENCOUNTER — Encounter (HOSPITAL_COMMUNITY): Payer: Self-pay | Admitting: Emergency Medicine

## 2014-12-16 ENCOUNTER — Other Ambulatory Visit: Payer: Self-pay

## 2014-12-16 ENCOUNTER — Telehealth: Payer: Self-pay | Admitting: Internal Medicine

## 2014-12-16 DIAGNOSIS — Z79899 Other long term (current) drug therapy: Secondary | ICD-10-CM | POA: Diagnosis not present

## 2014-12-16 DIAGNOSIS — E559 Vitamin D deficiency, unspecified: Secondary | ICD-10-CM | POA: Diagnosis not present

## 2014-12-16 DIAGNOSIS — Z87448 Personal history of other diseases of urinary system: Secondary | ICD-10-CM | POA: Insufficient documentation

## 2014-12-16 DIAGNOSIS — M79672 Pain in left foot: Secondary | ICD-10-CM | POA: Diagnosis present

## 2014-12-16 DIAGNOSIS — R609 Edema, unspecified: Secondary | ICD-10-CM | POA: Diagnosis not present

## 2014-12-16 MED ORDER — FUROSEMIDE 20 MG PO TABS: 20.0000 mg | ORAL_TABLET | Freq: Every day | ORAL | Status: DC

## 2014-12-16 NOTE — Discharge Instructions (Signed)
Peripheral Edema You have swelling in your legs (peripheral edema). This swelling is due to excess accumulation of salt and water in your body. Edema may be a sign of heart, kidney or liver disease, or a side effect of a medication. It may also be due to problems in the leg veins. Elevating your legs and using special support stockings may be very helpful, if the cause of the swelling is due to poor venous circulation. Avoid long periods of standing, whatever the cause. Treatment of edema depends on identifying the cause. Chips, pretzels, pickles and other salty foods should be avoided. Restricting salt in your diet is almost always needed. Water pills (diuretics) are often used to remove the excess salt and water from your body via urine. These medicines prevent the kidney from reabsorbing sodium. This increases urine flow. Diuretic treatment may also result in lowering of potassium levels in your body. Potassium supplements may be needed if you have to use diuretics daily. Daily weights can help you keep track of your progress in clearing your edema. You should call your caregiver for follow up care as recommended. SEEK IMMEDIATE MEDICAL CARE IF:   You have increased swelling, pain, redness, or heat in your legs.  You develop shortness of breath, especially when lying down.  You develop chest or abdominal pain, weakness, or fainting.  You have a fever. Document Released: 11/28/2004 Document Revised: 01/13/2012 Document Reviewed: 11/08/2009 Exit Patient Information 2015 [place], [place]. This information is not intended to replace advice given to you by your health care provider. Make sure you discuss any questions you have with your health care provider. You have been placed in TED hose to help decrease the edema in your feet/legs  Wear these tonight and tomorrow You can take them off tomorrow night for sleep but put them back on first thing on Sunday morning You have also been give a prescription  for Lasix 2 tablets take one on Saturday morning and one on Sunday morning  Make sure to keep your appointment on Monday with your PCP

## 2014-12-16 NOTE — ED Notes (Signed)
Pt c/o L foot pain for several weeks, pt states this is a flare from old injury.

## 2014-12-16 NOTE — Telephone Encounter (Signed)
Pt had an acute appt at 2:45 for swollen foot and came in late at 3:36. Pt rescheduled on Monday. Do we charge no show?

## 2014-12-16 NOTE — Telephone Encounter (Signed)
Pt has appt today regarding foot swelling.

## 2014-12-16 NOTE — ED Provider Notes (Signed)
CSN: [id number]     Arrival date & time 12/16/14  2240 History  This chart was scribed for non-physician practitioner,Markeria Goetsch Olean Ree, NP working with Ephraim Hamburger, MD by Judithann Sauger, ED Scribe. The patient was seen in room WTR5/WTR5 and the patient's care was started at 10:51 PM    Chief Complaint  Patient presents with  . Foot Pain   The history is provided by the patient. No language interpreter was used.   HPI Comments: Brittney Walker is a 64 y.o. female who presents to the Emergency Department complaining of a gradually worsening severe left foot pain onset several weeks ago. She explains that the pain is a flare up of an old injury. She reports associated redness that started at the ankle down to her toes. She reports that she has been soaking her foot. She states that she fell a couple of years ago and injured her left heel. She denies taking a fluid pill. She states that her BP is normally not this high and she has gained weight. She reports that she works from home at [employer] and she sits for a long period of time. She missed her appointment with her PCP today because she got out of work at home late.  Past Medical History  Diagnosis Date  . Thyroid disease      s/p RAI 2004  . Carotid bruit     w/ neg dopplers   . 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 Vitamin D deficiency    Past Surgical History  Procedure Laterality Date  . Breast surgery      x's 2--fibrocystic dz  . Tonsillectomy    . Abdominal hysterectomy  1997    TAH-LEIOMYOMATA-- no BSO  . Biopsy thyroid       bx of largest nodule 2005, benign   Family History  Problem Relation Age of Onset  . Seizures Father     Epilepsy  . Hypertension Father   . Heart attack Father     ?  Marland Kitchen Thyroid disease Other     uncle  . Breast cancer Sister   . Colon cancer Neg Hx   . Pancreatic cancer Neg Hx   . Esophageal cancer Neg Hx   . Prostate cancer Neg Hx   . Rectal cancer Neg Hx    History   Substance Use Topics  . Smoking status: Never Smoker   . Smokeless tobacco: Never Used  . Alcohol Use: No   OB History    Gravida Para Term Preterm AB TAB SAB Ectopic Multiple Living   0              Review of Systems  Constitutional: Negative for fever.  Respiratory: Negative for shortness of breath.   Cardiovascular: Positive for leg swelling.  Musculoskeletal: Positive for arthralgias (left foot).      Allergies  Review of patient's allergies indicates no known allergies.  Home Medications   Prior to Admission medications   Medication Sig Start Date End Date Taking? Authorizing Provider  calcium carbonate (OS-CAL - DOSED IN MG OF ELEMENTAL CALCIUM) 1250 MG tablet Take 1 tablet by mouth daily with breakfast.    Historical Provider, MD  cholecalciferol (VITAMIN D) 1000 UNITS tablet Take 1,000 Units by mouth daily.    Historical Provider, MD  furosemide (LASIX) 20 MG tablet Take 1 tablet (20 mg total) by mouth daily. 12/16/14   Garald Balding, NP  Multiple Vitamin (MULTIVITAMIN) capsule Take 1  capsule by mouth daily.    Historical Provider, MD  vitamin A 10000 UNIT capsule Take 10,000 Units by mouth daily.    Historical Provider, MD  vitamin B-12 (CYANOCOBALAMIN) 1000 MCG tablet Take 1,000 mcg by mouth daily.    Historical Provider, MD  vitamin C (ASCORBIC ACID) 500 MG tablet Take 1,000 mg by mouth daily.    Historical Provider, MD  Vitamin D, Ergocalciferol, (DRISDOL) 50000 UNITS CAPS capsule Take 1 capsule (50,000 Units total) by mouth every 7 (seven) days. 11/01/14   Colon Branch, MD  vitamin E 400 UNIT capsule Take 400 Units by mouth daily.    Historical Provider, MD   BP 152/63 mmHg  Pulse 70  Temp(Src) 98 F (36.7 C) (Oral)  Resp 18  SpO2 100%  LMP 11/05/1995 Physical Exam  Constitutional: She is oriented to person, place, and time. She appears well-developed and well-nourished. No distress.  HENT:  Head: Normocephalic and atraumatic.  Eyes: Conjunctivae and EOM are  normal.  Neck: Normal range of motion. Neck supple. No tracheal deviation present.  Cardiovascular: Normal rate.   Pulmonary/Chest: Effort normal. No respiratory distress.  Musculoskeletal: Normal range of motion. She exhibits edema and tenderness.       Legs: Neurological: She is alert and oriented to person, place, and time.  Skin: Skin is warm and dry.  Psychiatric: She has a normal mood and affect. Her behavior is normal.  Nursing note and vitals reviewed.   ED Course  Procedures (including critical care time) DIAGNOSTIC STUDIES: Oxygen Saturation is 100% on RA, normal by my interpretation.    COORDINATION OF CARE: 10:57 PM- Pt advised of plan for treatment and pt agrees.    Labs Review Labs Reviewed - No data to display  Imaging Review No results found.   EKG Interpretation None     BP slightly elevated, will give 2 days of lasix and place TED hose  Patient to keep appointment with PCP on Monday  MDM   Final diagnoses:  Peripheral edema   I personally performed the services described in this documentation, which was scribed in my presence. The recorded information has been reviewed and is accurate.  Garald Balding, NP 12/16/14 8916  Ephraim Hamburger, MD 12/19/14 [phone]

## 2014-12-18 NOTE — Telephone Encounter (Signed)
No, thx 

## 2014-12-19 ENCOUNTER — Ambulatory Visit: Payer: BLUE CROSS/BLUE SHIELD | Admitting: Internal Medicine

## 2014-12-19 NOTE — Telephone Encounter (Signed)
Discuss the case with endocrinology, instead of pursuing a   repeat the biopsy will refer to endocrinology, she may need further workup to determine if this is a cold or hot nodule. Please let the patient known. Arrange a endocrinology referral, DX goiter

## 2014-12-19 NOTE — Telephone Encounter (Signed)
Spoke with Pt, informed her of recommendations and informed her that referral to endocrinology has been placed. Pt verbalized understanding. Informed her that due to weather it may take several days. Pt verbalized understanding.

## 2014-12-19 NOTE — Addendum Note (Signed)
Addended by: Wilfrid Lund on: 12/19/2014 03:08 PM   Modules accepted: Orders

## 2014-12-21 ENCOUNTER — Encounter: Payer: Self-pay | Admitting: Internal Medicine

## 2014-12-21 ENCOUNTER — Ambulatory Visit (INDEPENDENT_AMBULATORY_CARE_PROVIDER_SITE_OTHER): Payer: BLUE CROSS/BLUE SHIELD | Admitting: Internal Medicine

## 2014-12-21 VITALS — BP 127/82 | HR 82 | Temp 98.2°F | Ht 64.0 in | Wt 273.0 lb

## 2014-12-21 DIAGNOSIS — M25579 Pain in unspecified ankle and joints of unspecified foot: Secondary | ICD-10-CM | POA: Insufficient documentation

## 2014-12-21 DIAGNOSIS — R739 Hyperglycemia, unspecified: Secondary | ICD-10-CM

## 2014-12-21 DIAGNOSIS — M25572 Pain in left ankle and joints of left foot: Secondary | ICD-10-CM

## 2014-12-21 MED ORDER — COLCHICINE 0.6 MG PO CAPS: ORAL_CAPSULE | ORAL | Status: DC

## 2014-12-21 MED ORDER — PREDNISONE 10 MG PO TABS: 10.0000 mg | ORAL_TABLET | Freq: Every day | ORAL | Status: DC

## 2014-12-21 NOTE — Progress Notes (Signed)
Subjective:    Patient ID: Brittney Walker, female    DOB: 06-12-1951, 64 y.o.   MRN: [id number]  DOS:  12/21/2014 Type of visit - description : acute Interval history: The patient reports that approximately 2 weeks ago developed pain and swelling at the left lower extremity, most of the pain and swelling was concentrated at the base of the great toe but she also noted some swelling proximal from the ankle. Denies any injury. Went to the ER, was prescribed Lasix, she has not taken it yet. Since the ER visit 5 days ago she is already improving spontaneously. She did have some swelling at the right leg as well but that is largely gone On looking back, she had similar episodes before, never diagnosed with gout before  Review of Systems  Denies fever chills No nausea, vomiting, diarrhea  Past Medical History  Diagnosis Date  . Thyroid disease      s/p RAI 2004  . Carotid bruit     w/ neg dopplers   . 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 Vitamin D deficiency     Past Surgical History  Procedure Laterality Date  . Breast surgery      x's 2--fibrocystic dz  . Tonsillectomy    . Abdominal hysterectomy  1997    TAH-LEIOMYOMATA-- no BSO  . Biopsy thyroid       bx of largest nodule 2005, benign    History   Social History  . Marital Status: Single    Spouse Name: N/A  . Number of Children: 0  . Years of Education: N/A   Occupational History  . collections     Social History Main Topics  . Smoking status: Never Smoker   . Smokeless tobacco: Never Used  . Alcohol Use: No  . Drug Use: No  . Sexual Activity: No   Other Topics Concern  . Not on file   Social History Narrative   Lives by herself         Medication List       This list is accurate as of: 12/21/14 11:59 PM.  Always use your most recent med list.               calcium carbonate 1250 (500 CA) MG tablet  Commonly known as:  OS-CAL - dosed in mg of elemental calcium  Take 1  tablet by mouth daily with breakfast.     cholecalciferol 1000 UNITS tablet  Commonly known as:  VITAMIN D  Take 1,000 Units by mouth daily.     Colchicine 0.6 MG Caps  1 tablet twice a day as needed for gout     multivitamin capsule  Take 1 capsule by mouth daily.     predniSONE 10 MG tablet  Commonly known as:  DELTASONE  Take 1 tablet (10 mg total) by mouth daily. 2 tabs a day x 5 days     vitamin A 10000 UNIT capsule  Take 10,000 Units by mouth daily.     vitamin B-12 1000 MCG tablet  Commonly known as:  CYANOCOBALAMIN  Take 1,000 mcg by mouth daily.     vitamin C 500 MG tablet  Commonly known as:  ASCORBIC ACID  Take 1,000 mg by mouth daily.     Vitamin D (Ergocalciferol) 50000 UNITS Caps capsule  Commonly known as:  DRISDOL  Take 1 capsule (50,000 Units total) by mouth every 7 (seven) days.     vitamin E 400  UNIT capsule  Take 400 Units by mouth daily.           Objective:   Physical Exam BP 127/82 mmHg  Pulse 82  Temp(Src) 98.2 F (36.8 C) (Oral)  Ht 5\' 4"  (1.626 m)  Wt 273 lb (123.832 kg)  BMI 46.84 kg/m2  SpO2 95%  LMP 11/05/1995  General:   Well developed, well nourished . NAD.  HEENT:  Normocephalic . Face symmetric, atraumatic Skin: Not pale. Not jaundice Lower extremities: Right leg normal Left leg: Mild to moderate pedal swelling, area slightly warm, at the base of the great toe she is TTP. All toes with good capillary refill. Minimal pretibial edema. Neurologic:  alert & oriented X3.  Speech normal, gait slightly limited by foot pain and unassisted Psych--  Cognition and judgment appear intact.  Cooperative with normal attention span and concentration.  Behavior appropriate. No anxious or depressed appearing.       Assessment & Plan:   Recently, CBG was slt  high, will check an A1c.  Today , I spent more than 25   min with the patient: >50% of the time counseling regards to what is gout, gout diet.

## 2014-12-21 NOTE — Patient Instructions (Signed)
Get your blood work before you leave   Don't take Lasix  Elevated the leg, ice twice a day Take prednisone as prescribed Take colchicine twice a day as needed   Call if not gradually improving  Please repeat the  gout diet    Gout Gout is an inflammatory arthritis caused by a buildup of uric acid crystals in the joints. Uric acid is a chemical that is normally present in the blood. When the level of uric acid in the blood is too high it can form crystals that deposit in your joints and tissues. This causes joint redness, soreness, and swelling (inflammation). Repeat attacks are common. Over time, uric acid crystals can form into masses (tophi) near a joint, destroying bone and causing disfigurement. Gout is treatable and often preventable. CAUSES  The disease begins with elevated levels of uric acid in the blood. Uric acid is produced by your body when it breaks down a naturally found substance called purines. Certain foods you eat, such as meats and fish, contain high amounts of purines. Causes of an elevated uric acid level include:  Being passed down from parent to child (heredity).  Diseases that cause increased uric acid production (such as obesity, psoriasis, and certain cancers).  Excessive alcohol use.  Diet, especially diets rich in meat and seafood.  Medicines, including certain cancer-fighting medicines (chemotherapy), water pills (diuretics), and aspirin.  Chronic kidney disease. The kidneys are no longer able to remove uric acid well.  Problems with metabolism. Conditions strongly associated with gout include:  Obesity.  High blood pressure.  High cholesterol.  Diabetes. Not everyone with elevated uric acid levels gets gout. It is not understood why some people get gout and others do not. Surgery, joint injury, and eating too much of certain foods are some of the factors that can lead to gout attacks. SYMPTOMS   An attack of gout comes on quickly. It causes  intense pain with redness, swelling, and warmth in a joint.  Fever can occur.  Often, only one joint is involved. Certain joints are more commonly involved:  Base of the big toe.  Knee.  Ankle.  Wrist.  Finger. Without treatment, an attack usually goes away in a few days to weeks. Between attacks, you usually will not have symptoms, which is different from many other forms of arthritis. DIAGNOSIS  Your caregiver will suspect gout based on your symptoms and exam. In some cases, tests may be recommended. The tests may include:  Blood tests.  Urine tests.  X-rays.  Joint fluid exam. This exam requires a needle to remove fluid from the joint (arthrocentesis). Using a microscope, gout is confirmed when uric acid crystals are seen in the joint fluid. TREATMENT  There are two phases to gout treatment: treating the sudden onset (acute) attack and preventing attacks (prophylaxis).  Treatment of an Acute Attack.  Medicines are used. These include anti-inflammatory medicines or steroid medicines.  An injection of steroid medicine into the affected joint is sometimes necessary.  The painful joint is rested. Movement can worsen the arthritis.  You may use warm or cold treatments on painful joints, depending which works best for you.  Treatment to Prevent Attacks.  If you suffer from frequent gout attacks, your caregiver may advise preventive medicine. These medicines are started after the acute attack subsides. These medicines either help your kidneys eliminate uric acid from your body or decrease your uric acid production. You may need to stay on these medicines for a very long  time.  The early phase of treatment with preventive medicine can be associated with an increase in acute gout attacks. For this reason, during the first few months of treatment, your caregiver may also advise you to take medicines usually used for acute gout treatment. Be sure you understand your caregiver's  directions. Your caregiver may make several adjustments to your medicine dose before these medicines are effective.  Discuss dietary treatment with your caregiver or dietitian. Alcohol and drinks high in sugar and fructose and foods such as meat, poultry, and seafood can increase uric acid levels. Your caregiver or dietitian can advise you on drinks and foods that should be limited. HOME CARE INSTRUCTIONS   Do not take aspirin to relieve pain. This raises uric acid levels.  Only take over-the-counter or prescription medicines for pain, discomfort, or fever as directed by your caregiver.  Rest the joint as much as possible. When in bed, keep sheets and blankets off painful areas.  Keep the affected joint raised (elevated).  Apply warm or cold treatments to painful joints. Use of warm or cold treatments depends on which works best for you.  Use crutches if the painful joint is in your leg.  Drink enough fluids to keep your urine clear or pale yellow. This helps your body get rid of uric acid. Limit alcohol, sugary drinks, and fructose drinks.  Follow your dietary instructions. Pay careful attention to the amount of protein you eat. Your daily diet should emphasize fruits, vegetables, whole grains, and fat-free or low-fat milk products. Discuss the use of coffee, vitamin C, and cherries with your caregiver or dietitian. These may be helpful in lowering uric acid levels.  Maintain a healthy body weight. SEEK MEDICAL CARE IF:   You develop diarrhea, vomiting, or any side effects from medicines.  You do not feel better in 24 hours, or you are getting worse. SEEK IMMEDIATE MEDICAL CARE IF:   Your joint becomes suddenly more tender, and you have chills or a fever. MAKE SURE YOU:   Understand these instructions.  Will watch your condition.  Will get help right away if you are not doing well or get worse. Document Released: 10/18/2000 Document Revised: 03/07/2014 Document Reviewed:  06/03/2012 [facility] Patient Information 2015 [place], [place]. This information is not intended to replace advice given to you by your health care provider. Make sure you discuss any questions you have with your health care provider.

## 2014-12-21 NOTE — Progress Notes (Signed)
Pre visit review using our clinic review tool, if applicable. No additional management support is needed unless otherwise documented below in the visit note. 

## 2014-12-22 ENCOUNTER — Telehealth: Payer: Self-pay | Admitting: Internal Medicine

## 2014-12-22 ENCOUNTER — Other Ambulatory Visit: Payer: Self-pay

## 2014-12-22 MED ORDER — PREDNISONE 10 MG PO TABS: ORAL_TABLET | ORAL | Status: DC

## 2014-12-22 MED ORDER — COLCHICINE 0.6 MG PO CAPS: ORAL_CAPSULE | ORAL | Status: DC

## 2014-12-22 NOTE — Telephone Encounter (Signed)
Caller name: Estreya Relation to pt: self Call back number: [phone] Pharmacy:  Reason for call:   Patient states that she no longer uses [facility]. Please send prescriptions from yesterday visit to [facility] on [address]

## 2014-12-22 NOTE — Telephone Encounter (Signed)
Prednisone and Colchicine resent to [facility] as requested.

## 2014-12-22 NOTE — Assessment & Plan Note (Signed)
Suspect gout, Will treat as such with a low dose of prednisone, colchicine, elevation and rest. Information about the diagnosis of gout and appropriate diet d/w patient. Check a uric acid . Avoid Lasix

## 2014-12-28 ENCOUNTER — Other Ambulatory Visit: Payer: BLUE CROSS/BLUE SHIELD

## 2014-12-29 ENCOUNTER — Ambulatory Visit: Payer: BLUE CROSS/BLUE SHIELD | Admitting: Internal Medicine

## 2014-12-30 ENCOUNTER — Encounter: Payer: BC Managed Care – PPO | Admitting: Internal Medicine

## 2015-01-03 ENCOUNTER — Other Ambulatory Visit: Payer: BLUE CROSS/BLUE SHIELD

## 2015-01-06 ENCOUNTER — Ambulatory Visit (INDEPENDENT_AMBULATORY_CARE_PROVIDER_SITE_OTHER): Payer: BLUE CROSS/BLUE SHIELD | Admitting: Internal Medicine

## 2015-01-06 ENCOUNTER — Encounter: Payer: Self-pay | Admitting: Internal Medicine

## 2015-01-06 VITALS — BP 118/62 | HR 67 | Temp 97.3°F | Resp 12 | Ht 67.0 in | Wt 271.0 lb

## 2015-01-06 DIAGNOSIS — E042 Nontoxic multinodular goiter: Secondary | ICD-10-CM

## 2015-01-06 NOTE — Progress Notes (Signed)
Patient ID: Brittney Walker, female   DOB: 05-17-1951, 64 y.o.   MRN: [id number]   HPI  Brittney Walker is a 64 y.o.-year-old female, referred by her PCP, Dr. Larose Kells, for evaluation for MNG. Previously seen by Dr. Loanne Drilling in 2004.   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  She has a h/o thyroid nodules, s/p L dominant nodule Bx in 2005 (unsatisfactory sample).   Latest Thyroid U/S (10/20/2014) - 4 nodules: Right thyroid lobe: 4.4 x 2.9 x 2.2 cm.  - 2.0 x 1.4 x 1.7 cm cyst with mural nodularity. Previously, this measured 2.1 x 1.8 x 2.1 cm.  - 10 mm ill-defined hyperechoic upper pole nodule.  Left thyroid lobe: 5.4 x 2.8 x 1.7 cm.  - 1.4 x 1.0 x 1.1 cm upper pole complex but predominantly solid nodule. Previously, this measured 1.5 x 0.9 x 1.1 cm.  - 2.2 x 1.5 x 1.4 cm complex lower pole nodule which is predominately cystic. There are some internal calcifications. Previously, this measured 3.0 x 2.7 x 1.8 cm.  Isthmus Thickness: 10 mm. - 2.0 x 1.5 x 1.7 cm solid hypoechoic nodule.  Lymphadenopathy: None visualized.  IMPRESSION: New nodule in the isthmus measures 2.0 cm. Findings meet consensus criteria for biopsy.   She had an attempted isthmic nodule Bx on 12/13/2014  (unsatisfactory sample).  I reviewed pt's thyroid tests: Lab Results  Component Value Date   TSH 2.11 10/27/2014   TSH 1.875 02/14/2012   TSH 1.98 05/01/2009   TSH 1.70 06/24/2007   FREET4 0.8 06/24/2007    Pt denies feeling nodules in neck, hoarseness, dysphagia/odynophagia, SOB with lying down.  Pt c/o: - + weight gain - + fatigue - no heat intolerance/cold intolerance - no tremors - no palpitations - no anxiety/depression - no hyperdefecation/constipation - no dry skin - no hair falling  Pt does have a FH of thyroid ds.: mother. No FH of thyroid cancer. No h/o radiation tx to head or neck (other than RAI tx).  No seaweed or kelp, no recent contrast studies. No steroid use. No  herbal supplements.   I reviewed her chart and she also has a history of gout; she had partial hysterectomy for fibroids.  ROS: Constitutional: see HPI Eyes: no blurry vision, no xerophthalmia ENT: no sore throat, no nodules palpated in throat, no dysphagia/odynophagia, no hoarseness Cardiovascular: no CP/SOB/palpitations/+ leg swelling Respiratory: no cough/SOB Gastrointestinal: no N/V/D/C Musculoskeletal: no muscle/+ joint swelling (toe) - gout Skin: no rashes Neurological: no tremors/numbness/tingling/dizziness Psychiatric: no depression/anxiety  Past Medical History  Diagnosis Date  . Thyroid disease      s/p RAI 2004  . Carotid bruit     w/ neg dopplers   . 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 Vitamin D deficiency    Past Surgical History  Procedure Laterality Date  . Breast surgery      x's 2--fibrocystic dz  . Tonsillectomy    . Abdominal hysterectomy  1997    TAH-LEIOMYOMATA-- no BSO  . Biopsy thyroid       bx of largest nodule 2005, benign   History   Social History  . Marital Status: Single    Spouse Name: N/A  . 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 Smoking status: Never Smoker   . Smokeless tobacco: Never Used  . Alcohol Use: No  . Drug Use: No  Social History Narrative   Lives by herself    Current Outpatient Prescriptions on File Prior to Visit  Medication Sig Dispense Refill  . calcium carbonate (OS-CAL - DOSED IN MG OF ELEMENTAL CALCIUM) 1250 MG tablet Take 1 tablet by mouth daily with breakfast.    . cholecalciferol (VITAMIN D) 1000 UNITS tablet Take 1,000 Units by mouth daily.    . Colchicine 0.6 MG CAPS 1 tablet twice a day as needed for gout 60 capsule 0  . Multiple Vitamin (MULTIVITAMIN) capsule Take 1 capsule by mouth daily.    . predniSONE (DELTASONE) 10 MG tablet Take 2 tablets daily for 5 days 10 tablet 0  . vitamin A 10000 UNIT capsule Take 10,000 Units by  mouth daily.    . vitamin B-12 (CYANOCOBALAMIN) 1000 MCG tablet Take 1,000 mcg by mouth daily.    . vitamin C (ASCORBIC ACID) 500 MG tablet Take 1,000 mg by mouth daily.    . Vitamin D, Ergocalciferol, (DRISDOL) 50000 UNITS CAPS capsule Take 1 capsule (50,000 Units total) by mouth every 7 (seven) days. 12 capsule 0  . vitamin E 400 UNIT capsule Take 400 Units by mouth daily.     No current facility-administered medications on file prior to visit.   No Known Allergies Family History  Problem Relation Age of Onset  . Seizures Father     Epilepsy  . Hypertension Father   . Heart attack Father     ?  Marland Kitchen Thyroid disease Other     uncle  . Breast cancer Sister   . Colon cancer Neg Hx   . Pancreatic cancer Neg Hx   . Esophageal cancer Neg Hx   . Prostate cancer Neg Hx   . Rectal cancer Neg Hx    PE: BP 118/62 mmHg  Pulse 67  Temp(Src) 97.3 F (36.3 C) (Oral)  Resp 12  Ht '5\' 7"'  (1.702 m)  Wt 271 lb (122.925 kg)  BMI 42.43 kg/m2  SpO2 97%  LMP 11/05/1995 Wt Readings from Last 3 Encounters:  01/06/15 271 lb (122.925 kg)  12/21/14 273 lb (123.832 kg)  10/14/14 274 lb 4 oz (124.399 kg)   Constitutional: obese, in NAD Eyes: PERRLA, EOMI, no exophthalmos ENT: moist mucous membranes, no thyromegaly, no palpable nodulesno cervical lymphadenopathy Cardiovascular: RRR, No MRG Respiratory: CTA B Gastrointestinal: abdomen soft, NT, ND, BS+ Musculoskeletal: no deformities, strength intact in all 4;  Skin: moist, warm, no rashes Neurological: no tremor with outstretched hands, DTR normal in all 4  ASSESSMENT: 1. MNG - thyroid U/S:   PLAN: 1. MNG  - I reviewed the images of her thyroid ultrasound along with the patient. I pointed out that the dominant nodules are large, this being a risk factor for cancer. 2 of the nodules are hyperechoic, which confers a lower risk for cancer. One nodule (L dominant) has possible calcifications, which can be seen in PTC. None of the nodules has  internal blood flow or are more wide than tall, which is great. Pt does not have a thyroid cancer family history or a personal history of RxTx to head/neck. All these would favor benignity.  -She recently had an attempted Bx of the isthmic nodule >> unsatisfactory sample. - I d/w the pt to repeat the U/S at a year from now and then attempt to re- Bx the isthmic and L dominant nodule at that time. She agrees with the plan. - she should let me know if she develops neck compression symptoms, in that case,  we might need to do either lobectomy or thyroidectomy - she will call me before next visit so that I can order the U/S and we can look at the images together at next visit. - I advised pt to join my chart and I will send her the results through there

## 2015-01-06 NOTE — Patient Instructions (Signed)
Please call our office in January 2017 to schedule another appt for 01/2016 and to let me know to order the new ultrasound of the thyroid.

## 2015-01-10 ENCOUNTER — Encounter: Payer: Self-pay | Admitting: Gynecology

## 2015-01-10 ENCOUNTER — Other Ambulatory Visit (INDEPENDENT_AMBULATORY_CARE_PROVIDER_SITE_OTHER): Payer: BLUE CROSS/BLUE SHIELD

## 2015-01-10 ENCOUNTER — Other Ambulatory Visit (HOSPITAL_COMMUNITY)
Admission: RE | Admit: 2015-01-10 | Discharge: 2015-01-10 | Disposition: A | Discharge status: Home / Self Care | Payer: BLUE CROSS/BLUE SHIELD | Source: Ambulatory Visit | Attending: Gynecology | Admitting: Gynecology

## 2015-01-10 ENCOUNTER — Other Ambulatory Visit: Payer: Self-pay

## 2015-01-10 ENCOUNTER — Ambulatory Visit (INDEPENDENT_AMBULATORY_CARE_PROVIDER_SITE_OTHER): Payer: BLUE CROSS/BLUE SHIELD | Admitting: Gynecology

## 2015-01-10 VITALS — BP 126/84 | Ht 64.0 in | Wt 275.0 lb

## 2015-01-10 DIAGNOSIS — Z01419 Encounter for gynecological examination (general) (routine) without abnormal findings: Secondary | ICD-10-CM

## 2015-01-10 DIAGNOSIS — M109 Gout, unspecified: Secondary | ICD-10-CM

## 2015-01-10 LAB — URIC ACID: URIC ACID, SERUM: 7.5 mg/dL — AB (ref 2.4–7.0)

## 2015-01-10 NOTE — Patient Instructions (Signed)
Follow up for bone density as scheduled.  You may obtain a copy of any labs that were done today by logging onto MyChart as outlined in the instructions provided with your AVS (after visit summary). The office will not call with normal lab results but certainly if there are any significant abnormalities then we will contact you.   Health Maintenance, Female A healthy lifestyle and preventative care can promote health and wellness.  Maintain regular health, dental, and eye exams.  Eat a healthy diet. Foods like vegetables, fruits, whole grains, low-fat dairy products, and lean protein foods contain the nutrients you need without too many calories. Decrease your intake of foods high in solid fats, added sugars, and salt. Get information about a proper diet from your caregiver, if necessary.  Regular physical exercise is one of the most important things you can do for your health. Most adults should get at least 150 minutes of moderate-intensity exercise (any activity that increases your heart rate and causes you to sweat) each week. In addition, most adults need muscle-strengthening exercises on 2 or more days a week.   Maintain a healthy weight. The body mass index (BMI) is a screening tool to identify possible weight problems. It provides an estimate of body fat based on height and weight. Your caregiver can help determine your BMI, and can help you achieve or maintain a healthy weight. For adults 20 years and older:  A BMI below 18.5 is considered underweight.  A BMI of 18.5 to 24.9 is normal.  A BMI of 25 to 29.9 is considered overweight.  A BMI of 30 and above is considered obese.  Maintain normal blood lipids and cholesterol by exercising and minimizing your intake of saturated fat. Eat a balanced diet with plenty of fruits and vegetables. Blood tests for lipids and cholesterol should begin at age 20 and be repeated every 5 years. If your lipid or cholesterol levels are high, you are over  50, or you are a high risk for heart disease, you may need your cholesterol levels checked more frequently.Ongoing high lipid and cholesterol levels should be treated with medicines if diet and exercise are not effective.  If you smoke, find out from your caregiver how to quit. If you do not use tobacco, do not start.  Lung cancer screening is recommended for adults aged 55 80 years who are at high risk for developing lung cancer because of a history of smoking. Yearly low-dose computed tomography (CT) is recommended for people who have at least a 30-pack-year history of smoking and are a current smoker or have quit within the past 15 years. A pack year of smoking is smoking an average of 1 pack of cigarettes a day for 1 year (for example: 1 pack a day for 30 years or 2 packs a day for 15 years). Yearly screening should continue until the smoker has stopped smoking for at least 15 years. Yearly screening should also be stopped for people who develop a health problem that would prevent them from having lung cancer treatment.  If you are pregnant, do not drink alcohol. If you are breastfeeding, be very cautious about drinking alcohol. If you are not pregnant and choose to drink alcohol, do not exceed 1 drink per day. One drink is considered to be 12 ounces (355 mL) of beer, 5 ounces (148 mL) of wine, or 1.5 ounces (44 mL) of liquor.  Avoid use of street drugs. Do not share needles with anyone. Ask for help   if you need support or instructions about stopping the use of drugs.  High blood pressure causes heart disease and increases the risk of stroke. Blood pressure should be checked at least every 1 to 2 years. Ongoing high blood pressure should be treated with medicines, if weight loss and exercise are not effective.  If you are 55 to 65 years old, ask your caregiver if you should take aspirin to prevent strokes.  Diabetes screening involves taking a blood sample to check your fasting blood sugar level.  This should be done once every 3 years, after age 45, if you are within normal weight and without risk factors for diabetes. Testing should be considered at a younger age or be carried out more frequently if you are overweight and have at least 1 risk factor for diabetes.  Breast cancer screening is essential preventative care for women. You should practice "breast self-awareness." This means understanding the normal appearance and feel of your breasts and may include breast self-examination. Any changes detected, no matter how small, should be reported to a caregiver. Women in their 20s and 30s should have a clinical breast exam (CBE) by a caregiver as part of a regular health exam every 1 to 3 years. After age 40, women should have a CBE every year. Starting at age 40, women should consider having a mammogram (breast X-ray) every year. Women who have a family history of breast cancer should talk to their caregiver about genetic screening. Women at a high risk of breast cancer should talk to their caregiver about having an MRI and a mammogram every year.  Breast cancer gene (BRCA)-related cancer risk assessment is recommended for women who have family members with BRCA-related cancers. BRCA-related cancers include breast, ovarian, tubal, and peritoneal cancers. Having family members with these cancers may be associated with an increased risk for harmful changes (mutations) in the breast cancer genes BRCA1 and BRCA2. Results of the assessment will determine the need for genetic counseling and BRCA1 and BRCA2 testing.  The Pap test is a screening test for cervical cancer. Women should have a Pap test starting at age 21. Between ages 21 and 29, Pap tests should be repeated every 2 years. Beginning at age 30, you should have a Pap test every 3 years as long as the past 3 Pap tests have been normal. If you had a hysterectomy for a problem that was not cancer or a condition that could lead to cancer, then you no  longer need Pap tests. If you are between ages 65 and 70, and you have had normal Pap tests going back 10 years, you no longer need Pap tests. If you have had past treatment for cervical cancer or a condition that could lead to cancer, you need Pap tests and screening for cancer for at least 20 years after your treatment. If Pap tests have been discontinued, risk factors (such as a new sexual partner) need to be reassessed to determine if screening should be resumed. Some women have medical problems that increase the chance of getting cervical cancer. In these cases, your caregiver may recommend more frequent screening and Pap tests.  The human papillomavirus (HPV) test is an additional test that may be used for cervical cancer screening. The HPV test looks for the virus that can cause the cell changes on the cervix. The cells collected during the Pap test can be tested for HPV. The HPV test could be used to screen women aged 30 years and older, and   be used in women of any age who have unclear Pap test results. After the age of 63, women should have HPV testing at the same frequency as a Pap test.  Colorectal cancer can be detected and often prevented. Most routine colorectal cancer screening begins at the age of 67 and continues through age 18. However, your caregiver Emel recommend screening at an earlier age if you have risk factors for colon cancer. On a yearly basis, your caregiver Vila provide home test kits to check for hidden blood in the stool. Use of a small camera at the end of a tube, to directly examine the colon (sigmoidoscopy or colonoscopy), can detect the earliest forms of colorectal cancer. Talk to your caregiver about this at age 65, when routine screening begins. Direct examination of the colon should be repeated every 5 to 10 years through age 70, unless early forms of pre-cancerous polyps or small growths are found.  Hepatitis C blood testing is recommended for all people born from  [birth year] through 1966 and any individual with known risks for hepatitis C.  Practice safe sex. Use condoms and avoid high-risk sexual practices to reduce the spread of sexually transmitted infections (STIs). Sexually active women aged 26 and younger should be checked for Chlamydia, which is a common sexually transmitted infection. Older women with new or multiple partners should also be tested for Chlamydia. Testing for other STIs is recommended if you are sexually active and at increased risk.  Osteoporosis is a disease in which the bones lose minerals and strength with aging. This can result in serious bone fractures. The risk of osteoporosis can be identified using a bone density scan. Women ages 9 and over and women at risk for fractures or osteoporosis should discuss screening with their caregivers. Ask your caregiver whether you should be taking a calcium supplement or vitamin D to reduce the rate of osteoporosis.  Menopause can be associated with physical symptoms and risks. Hormone replacement therapy is available to decrease symptoms and risks. You should talk to your caregiver about whether hormone replacement therapy is right for you.  Use sunscreen. Apply sunscreen liberally and repeatedly throughout the day. You should seek shade when your shadow is shorter than you. Protect yourself by wearing long sleeves, pants, a wide-brimmed hat, and sunglasses year round, whenever you are outdoors.  Notify your caregiver of new moles or changes in moles, especially if there is a change in shape or color. Also notify your caregiver if a mole is larger than the size of a pencil eraser.  Stay current with your immunizations. Document Released: 05/06/2011 Document Revised: 02/15/2013 Document Reviewed: 05/06/2011 [facility] Patient Information 2014 [employer].

## 2015-01-10 NOTE — Progress Notes (Signed)
Brittney Walker 03/07/1951 [id number]        64 y.o.  G0P0 for annual exam.  Doing well.  Past medical history,surgical history, problem list, medications, allergies, family history and social history were all reviewed and documented as reviewed in the EPIC chart.  ROS:  Performed with pertinent positives and negatives included in the history, assessment and plan.   Additional significant findings :  none   Exam: Kim Counsellor Vitals:   01/10/15 1554  BP: 126/84  Height: 5\' 4"  (1.626 m)  Weight: 275 lb (124.739 kg)   General appearance:  Normal affect, orientation and appearance. Skin: Grossly normal HEENT: Without gross lesions.  No cervical or supraclavicular adenopathy. Thyroid normal.  Lungs:  Clear without wheezing, rales or rhonchi Cardiac: RR, without RMG Abdominal:  Soft, nontender, without masses, guarding, rebound, organomegaly or hernia Breasts:  Examined lying and sitting without masses, retractions, discharge or axillary adenopathy. Pelvic:  Ext/BUS/vagina with atrophic changes. Pap smear of cuff done  Adnexa  Without masses or tenderness    Anus and perineum  Normal   Rectovaginal  Normal sphincter tone without palpated masses or tenderness.    Assessment/Plan:  64 y.o. G0P0 female for annual exam.   1. 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 Doing well without significant hot flashes, night sweats or vaginal dryness. Continue to monitor. 2. History of VIN 1 2000. Vulvar exam is normal. Continue with self vulvar exams report any abnormalities. 3. Pap smear 2011. Pap smear of vaginal cuff today. Status post hysterectomy for benign indications. Options to stop screening altogether reviewed per current screening guidelines and we will readdress this on an annual basis. 4. Mammography 12/2014. Patient has a recall tomorrow for questionable right oval mass on screening views. Patient is importance of follow up. Assuming negative then will follow  routinely. SBE monthly reviewed. 5. Colonoscopy 2015. Repeat at their recommended interval. 6. DEXA 2009 normal. Repeat DEXA now at 7 year interval. Increased calcium vitamin D reviewed. 7. Health maintenance. No routine blood work done as she has this done at her primary physician's office. Follow up for bone density otherwise 1 year, sooner if issues.     Anastasio Auerbach MD, 4:26 PM 01/10/2015

## 2015-01-11 LAB — HM MAMMOGRAPHY: HM MAMMO: NORMAL

## 2015-01-11 LAB — URINALYSIS W MICROSCOPIC + REFLEX CULTURE
Bacteria, UA: NONE SEEN
Bilirubin Urine: NEGATIVE
CASTS: NONE SEEN
CRYSTALS: NONE SEEN
GLUCOSE, UA: NEGATIVE mg/dL
HGB URINE DIPSTICK: NEGATIVE
Ketones, ur: NEGATIVE mg/dL
LEUKOCYTES UA: NEGATIVE
Nitrite: NEGATIVE
PH: 5 (ref 5.0–8.0)
PROTEIN: NEGATIVE mg/dL
SPECIFIC GRAVITY, URINE: 1.019 (ref 1.005–1.030)
SQUAMOUS EPITHELIAL / LPF: NONE SEEN
Urobilinogen, UA: 0.2 mg/dL (ref 0.0–1.0)

## 2015-01-12 ENCOUNTER — Encounter: Payer: Self-pay | Admitting: Gynecology

## 2015-01-20 LAB — CYTOLOGY - PAP

## 2015-01-30 ENCOUNTER — Telehealth: Payer: Self-pay | Admitting: Internal Medicine

## 2015-01-30 NOTE — Telephone Encounter (Signed)
Please print a letter  To whom it may concern, Brittney Walker is a patient of mine, she has a medical condition called gout, she is treated with as needed medication. This condition may cause pain but is definitely treatable.

## 2015-01-30 NOTE — Telephone Encounter (Signed)
FYI. Please advise.

## 2015-01-30 NOTE — Telephone Encounter (Signed)
Caller name: M[place], Mcquinn Relation to pt: self  Call back number: [phone]   Reason for call:  Pt requesting a letter stating that she has gout and that medication was prescribed and it's curable for school purpose. 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 Please advise pt would like to pick up

## 2015-01-31 NOTE — Telephone Encounter (Signed)
Letter printed, awaiting signature by Dr. Larose Kells.

## 2015-01-31 NOTE — Telephone Encounter (Signed)
Tried calling Pt to inform her that school letter is ready for pick up at front desk. No voicemail set up to leave message.

## 2015-02-08 ENCOUNTER — Other Ambulatory Visit: Payer: Self-pay

## 2015-03-06 ENCOUNTER — Ambulatory Visit (INDEPENDENT_AMBULATORY_CARE_PROVIDER_SITE_OTHER): Payer: BLUE CROSS/BLUE SHIELD

## 2015-03-06 ENCOUNTER — Other Ambulatory Visit: Payer: Self-pay | Admitting: Gynecology

## 2015-03-06 DIAGNOSIS — Z13820 Encounter for screening for osteoporosis: Secondary | ICD-10-CM

## 2015-03-06 DIAGNOSIS — Z01419 Encounter for gynecological examination (general) (routine) without abnormal findings: Secondary | ICD-10-CM

## 2015-04-18 ENCOUNTER — Ambulatory Visit: Payer: Self-pay | Admitting: Internal Medicine

## 2015-05-16 ENCOUNTER — Ambulatory Visit: Payer: Self-pay | Admitting: Internal Medicine

## 2015-05-18 ENCOUNTER — Telehealth: Payer: Self-pay | Admitting: Internal Medicine

## 2015-05-18 NOTE — Telephone Encounter (Signed)
No, thx 

## 2015-05-18 NOTE — Telephone Encounter (Signed)
Pt was no show 05/16/15 4:00pm, patient left message on VM stating that she is not feeling good and wants to reschedule 7/12 3:30pm, left message for patient to reschedule but pt has not called back, do you want to charge no show?

## 2015-10-11 ENCOUNTER — Telehealth: Payer: Self-pay | Admitting: Internal Medicine

## 2015-10-11 ENCOUNTER — Ambulatory Visit: Payer: BLUE CROSS/BLUE SHIELD | Admitting: Internal Medicine

## 2015-10-11 DIAGNOSIS — Z0289 Encounter for other administrative examinations: Secondary | ICD-10-CM

## 2015-10-11 NOTE — Telephone Encounter (Signed)
Pt Brittney Walker at 12:39 informing PCP that she will not be able to make her appt. Pt was rescheduled for tomorrow with [name]. Pt says that she wasn't feeling well enough to come in.    Should pt be charged a no show fee?

## 2015-10-11 NOTE — Telephone Encounter (Signed)
Will defer to Dr. Larose Kells. Charge or no?

## 2015-10-11 NOTE — Telephone Encounter (Signed)
no

## 2015-10-12 ENCOUNTER — Telehealth: Payer: Self-pay | Admitting: *Deleted

## 2015-10-12 ENCOUNTER — Encounter: Payer: Self-pay | Admitting: Medical

## 2015-10-12 ENCOUNTER — Ambulatory Visit (INDEPENDENT_AMBULATORY_CARE_PROVIDER_SITE_OTHER): Payer: BLUE CROSS/BLUE SHIELD | Admitting: Medical

## 2015-10-12 VITALS — BP 120/80 | HR 78 | Temp 97.8°F | Ht 64.0 in | Wt 271.0 lb

## 2015-10-12 DIAGNOSIS — M10072 Idiopathic gout, left ankle and foot: Secondary | ICD-10-CM | POA: Diagnosis not present

## 2015-10-12 DIAGNOSIS — M79675 Pain in left toe(s): Secondary | ICD-10-CM | POA: Diagnosis not present

## 2015-10-12 DIAGNOSIS — Z23 Encounter for immunization: Secondary | ICD-10-CM | POA: Diagnosis not present

## 2015-10-12 LAB — CBC WITH DIFFERENTIAL/PLATELET
BASOS PCT: 0.3 % (ref 0.0–3.0)
Basophils Absolute: 0 10*3/uL (ref 0.0–0.1)
EOS ABS: 0.1 10*3/uL (ref 0.0–0.7)
Eosinophils Relative: 2 % (ref 0.0–5.0)
HCT: 39 % (ref 36.0–46.0)
HEMOGLOBIN: 12.8 g/dL (ref 12.0–15.0)
Lymphocytes Relative: 28.6 % (ref 12.0–46.0)
Lymphs Abs: 1.9 10*3/uL (ref 0.7–4.0)
MCHC: 32.9 g/dL (ref 30.0–36.0)
MCV: 89 fl (ref 78.0–100.0)
Monocytes Absolute: 0.8 10*3/uL (ref 0.1–1.0)
Monocytes Relative: 12 % (ref 3.0–12.0)
NEUTROS PCT: 57.1 % (ref 43.0–77.0)
Neutro Abs: 3.7 10*3/uL (ref 1.4–7.7)
Platelets: 175 10*3/uL (ref 150.0–400.0)
RBC: 4.38 Mil/uL (ref 3.87–5.11)
RDW: 14.8 % (ref 11.5–15.5)
WBC: 6.6 10*3/uL (ref 4.0–10.5)

## 2015-10-12 LAB — COMPREHENSIVE METABOLIC PANEL
ALT: 14 U/L (ref 0–35)
AST: 14 U/L (ref 0–37)
Albumin: 4 g/dL (ref 3.5–5.2)
Alkaline Phosphatase: 47 U/L (ref 39–117)
BUN: 15 mg/dL (ref 6–23)
CO2: 29 meq/L (ref 19–32)
CREATININE: 0.79 mg/dL (ref 0.40–1.20)
Calcium: 9.4 mg/dL (ref 8.4–10.5)
Chloride: 105 mEq/L (ref 96–112)
GFR: 93.7 mL/min (ref >60.00)
GLUCOSE: 88 mg/dL (ref 70–99)
Potassium: 3.9 mEq/L (ref 3.5–5.1)
SODIUM: 141 meq/L (ref 135–145)
Total Bilirubin: 0.4 mg/dL (ref 0.2–1.2)
Total Protein: 7.8 g/dL (ref 6.0–8.3)

## 2015-10-12 LAB — URIC ACID: Uric Acid, Serum: 7.8 mg/dL — ABNORMAL HIGH (ref 2.4–7.0)

## 2015-10-12 MED ORDER — COLCHICINE 0.6 MG PO CAPS: ORAL_CAPSULE | ORAL | Status: DC

## 2015-10-12 MED ORDER — PREDNISONE 10 MG PO TABS: ORAL_TABLET | ORAL | Status: DC

## 2015-10-12 NOTE — Patient Instructions (Signed)
Likely gout.   Rx colchicine and prednisone.  Get uric acid, cbc and cmp today.  Will notify of results of labs when in. If having recurrent gout attacks consider preventative med  Follow up 7-10 days any residual symptom or as needed any worsening symptoms.  Gout Gout is an inflammatory arthritis caused by a buildup of uric acid crystals in the joints. Uric acid is a chemical that is normally present in the blood. When the level of uric acid in the blood is too high it can form crystals that deposit in your joints and tissues. This causes joint redness, soreness, and swelling (inflammation). Repeat attacks are common. Over time, uric acid crystals can form into masses (tophi) near a joint, destroying bone and causing disfigurement. Gout is treatable and often preventable. CAUSES  The disease begins with elevated levels of uric acid in the blood. Uric acid is produced by your body when it breaks down a naturally found substance called purines. Certain foods you eat, such as meats and fish, contain high amounts of purines. Causes of an elevated uric acid level include:  Being passed down from parent to child (heredity).  Diseases that cause increased uric acid production (such as obesity, psoriasis, and certain cancers).  Excessive alcohol use.  Diet, especially diets rich in meat and seafood.  Medicines, including certain cancer-fighting medicines (chemotherapy), water pills (diuretics), and aspirin.  Chronic kidney disease. The kidneys are no longer able to remove uric acid well.  Problems with metabolism. Conditions strongly associated with gout include:  Obesity.  High blood pressure.  High cholesterol.  Diabetes. Not everyone with elevated uric acid levels gets gout. It is not understood why some people get gout and others do not. Surgery, joint injury, and eating too much of certain foods are some of the factors that can lead to gout attacks. SYMPTOMS   An attack of gout  comes on quickly. It causes intense pain with redness, swelling, and warmth in a joint.  Fever can occur.  Often, only one joint is involved. Certain joints are more commonly involved:  Base of the big toe.  Knee.  Ankle.  Wrist.  Finger. Without treatment, an attack usually goes away in a few days to weeks. Between attacks, you usually will not have symptoms, which is different from many other forms of arthritis. DIAGNOSIS  Your caregiver will suspect gout based on your symptoms and exam. In some cases, tests may be recommended. The tests may include:  Blood tests.  Urine tests.  X-rays.  Joint fluid exam. This exam requires a needle to remove fluid from the joint (arthrocentesis). Using a microscope, gout is confirmed when uric acid crystals are seen in the joint fluid. TREATMENT  There are two phases to gout treatment: treating the sudden onset (acute) attack and preventing attacks (prophylaxis).  Treatment of an Acute Attack.  Medicines are used. These include anti-inflammatory medicines or steroid medicines.  An injection of steroid medicine into the affected joint is sometimes necessary.  The painful joint is rested. Movement can worsen the arthritis.  You may use warm or cold treatments on painful joints, depending which works best for you.  Treatment to Prevent Attacks.  If you suffer from frequent gout attacks, your caregiver may advise preventive medicine. These medicines are started after the acute attack subsides. These medicines either help your kidneys eliminate uric acid from your body or decrease your uric acid production. You may need to stay on these medicines for a very long  time.  The early phase of treatment with preventive medicine can be associated with an increase in acute gout attacks. For this reason, during the first few months of treatment, your caregiver may also advise you to take medicines usually used for acute gout treatment. Be sure you  understand your caregiver's directions. Your caregiver may make several adjustments to your medicine dose before these medicines are effective.  Discuss dietary treatment with your caregiver or dietitian. Alcohol and drinks high in sugar and fructose and foods such as meat, poultry, and seafood can increase uric acid levels. Your caregiver or dietitian can advise you on drinks and foods that should be limited. HOME CARE INSTRUCTIONS   Do not take aspirin to relieve pain. This raises uric acid levels.  Only take over-the-counter or prescription medicines for pain, discomfort, or fever as directed by your caregiver.  Rest the joint as much as possible. When in bed, keep sheets and blankets off painful areas.  Keep the affected joint raised (elevated).  Apply warm or cold treatments to painful joints. Use of warm or cold treatments depends on which works best for you.  Use crutches if the painful joint is in your leg.  Drink enough fluids to keep your urine clear or pale yellow. This helps your body get rid of uric acid. Limit alcohol, sugary drinks, and fructose drinks.  Follow your dietary instructions. Pay careful attention to the amount of protein you eat. Your daily diet should emphasize fruits, vegetables, whole grains, and fat-free or low-fat milk products. Discuss the use of coffee, vitamin C, and cherries with your caregiver or dietitian. These may be helpful in lowering uric acid levels.  Maintain a healthy body weight. SEEK MEDICAL CARE IF:   You develop diarrhea, vomiting, or any side effects from medicines.  You do not feel better in 24 hours, or you are getting worse. SEEK IMMEDIATE MEDICAL CARE IF:   Your joint becomes suddenly more tender, and you have chills or a fever. MAKE SURE YOU:   Understand these instructions.  Will watch your condition.  Will get help right away if you are not doing well or get worse.   This information is not intended to replace advice  given to you by your health care provider. Make sure you discuss any questions you have with your health care provider.   Document Released: 10/18/2000 Document Revised: 11/11/2014 Document Reviewed: 06/03/2012 Elsevier Interactive Patient Education [employer].

## 2015-10-12 NOTE — Progress Notes (Signed)
Subjective:    Patient ID: Brittney Walker, female    DOB: 08-15-1951, 64 y.o.   MRN: [id number]  HPI  Pt in with left great toe pain. Pain for about 2 days. Pt thinks something she ate. Pt states last flare was February. But not chronically. No trauma to toe.  Review of Systems  Constitutional: Negative for fever, chills and fatigue.  Respiratory: Negative for cough, chest tightness and wheezing.   Cardiovascular: Negative for chest pain and palpitations.  Musculoskeletal: Negative for back pain.       Lt great toe pain.  Hematological: Negative for adenopathy. Does not bruise/bleed easily.    Past Medical History  Diagnosis Date  . Thyroid disease      s/p RAI 2004  . Carotid bruit     w/ neg dopplers   . 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 Vitamin D deficiency     Social History   Social History  . Marital Status: Single    Spouse Name: N/A  . Number of Children: 0  . Years of Education: N/A   Occupational History  . collections     Social History Main Topics  . Smoking status: Never Smoker   . Smokeless tobacco: Never Used  . Alcohol Use: No  . Drug Use: No  . Sexual Activity: No     Comment: Virgin   Other Topics Concern  . Not on file   Social History Narrative   Lives by herself     Past Surgical History  Procedure Laterality Date  . Breast surgery      x's 2--fibrocystic dz  . Tonsillectomy    . Abdominal hysterectomy  1997    TAH-LEIOMYOMATA-- no BSO  . Biopsy thyroid       bx of largest nodule 2005, benign    Family History  Problem Relation Age of Onset  . Seizures Father     Epilepsy  . Hypertension Father   . Heart attack Father     ?  Marland Kitchen Breast cancer Sister 39  . Colon cancer Neg Hx   . Pancreatic cancer Neg Hx   . Esophageal cancer Neg Hx   . Prostate cancer Neg Hx   . Rectal cancer Neg Hx     No Known Allergies  Current Outpatient Prescriptions on File Prior to Visit  Medication Sig Dispense Refill    . calcium carbonate (OS-CAL - DOSED IN MG OF ELEMENTAL CALCIUM) 1250 MG tablet Take 1 tablet by mouth daily with breakfast.    . cholecalciferol (VITAMIN D) 1000 UNITS tablet Take 1,000 Units by mouth daily.    . Colchicine 0.6 MG CAPS 1 tablet twice a day as needed for gout 60 capsule 0  . Multiple Vitamin (MULTIVITAMIN) capsule Take 1 capsule by mouth daily.    . vitamin A 10000 UNIT capsule Take 10,000 Units by mouth daily.    . vitamin B-12 (CYANOCOBALAMIN) 1000 MCG tablet Take 1,000 mcg by mouth daily.    . vitamin C (ASCORBIC ACID) 500 MG tablet Take 1,000 mg by mouth daily.    . vitamin E 400 UNIT capsule Take 400 Units by mouth daily.     No current facility-administered medications on file prior to visit.    BP 120/80 mmHg  Pulse 78  Temp(Src) 97.8 F (36.6 C) (Oral)  Ht 5\' 4"  (1.626 m)  Wt 271 lb (122.925 kg)  BMI 46.49 kg/m2  SpO2 98%  LMP 11/05/1995  Objective:   Physical Exam  General- No acute distress. Pleasant patient. Lungs- Clear, even and unlabored. Heart- regular rate and rhythm. Neurologic- CNII- XII grossly intact.  Lt foot- left great toe mild red and swollen. Faint warmth and tender.      Assessment & Plan:  Likely gout.   Rx colchicine and prednisone.  Get uric acid, cbc and cmp today.  Will notify of results of labs when in. If having recurrent gout attacks consider preventative med  Follow up 7-10 days any residual symptom or as needed any worsening symptoms.

## 2015-10-12 NOTE — Telephone Encounter (Signed)
Pt requesting a copy of lab results once resulted. FYI

## 2015-10-12 NOTE — Progress Notes (Signed)
Pre visit review using our clinic review tool, if applicable. No additional management support is needed unless otherwise documented below in the visit note. 

## 2015-10-13 ENCOUNTER — Telehealth: Payer: Self-pay | Admitting: Internal Medicine

## 2015-10-13 NOTE — Telephone Encounter (Signed)
Noted.  Message routed to Dr. Larose Kells for [facility].

## 2015-10-13 NOTE — Telephone Encounter (Signed)
[facility] - Client [facility]  Patient Name: Brittney Walker  DOB: [birth year]    Initial Comment Caller states had a pna shot yesterday, took blood from same arm, woke up with it hurting and swollen, knot under skin   Nurse Assessment  Nurse: Wayne Sever, RN, Tillie Rung Date/Time (Eastern Time): 10/13/2015 10:22:40 AM  Confirm and document reason for call. If symptomatic, describe symptoms. ---Caller had 2 shots yesterday, pneumonia and flu. She has knot on the upper arm where the pneumonia shot was given. Denies any fever  Has the patient traveled out of the country within the last 30 days? ---Not Applicable  Does the patient have any new or worsening symptoms? ---Yes  Will a triage be completed? ---Yes  Related visit to physician within the last 2 weeks? ---No  Does the PT have any chronic conditions? (i.e. diabetes, asthma, etc.) ---Yes  List chronic conditions. ---Gout  Is this a behavioral health or substance abuse call? ---No     Guidelines    Guideline Title Affirmed Question Affirmed Notes  Immunization Reactions Pneumococcal vaccine reactions (all triage questions negative)    Final Disposition User   [place], RN, Tillie Rung    Disagree/Comply: Comply

## 2015-10-13 NOTE — Telephone Encounter (Signed)
noted 

## 2015-10-13 NOTE — Telephone Encounter (Signed)
Pt called in because she says that she got her flu shot and pneumonia vac yesterday 12/8. Pt says that her arm that she got her pneumonia vac in is hurting with a red knot on it.  Pt didn't feel that she needed an appt so I transferred pt to [facility] as a CMA in the office wasn't available.   Pt says that she will speak with them and if an appt is needed she will schedule.    Thanks.

## 2015-12-01 NOTE — Telephone Encounter (Signed)
Not our patient

## 2016-01-16 ENCOUNTER — Encounter: Payer: BLUE CROSS/BLUE SHIELD | Admitting: Gynecology

## 2016-03-05 LAB — HM MAMMOGRAPHY

## 2016-03-11 ENCOUNTER — Encounter: Payer: Self-pay | Admitting: Internal Medicine

## 2016-03-19 ENCOUNTER — Ambulatory Visit (INDEPENDENT_AMBULATORY_CARE_PROVIDER_SITE_OTHER): Payer: BLUE CROSS/BLUE SHIELD | Admitting: Gynecology

## 2016-03-19 ENCOUNTER — Encounter: Payer: Self-pay | Admitting: Gynecology

## 2016-03-19 VITALS — BP 124/76 | Ht 67.0 in | Wt 271.0 lb

## 2016-03-19 DIAGNOSIS — N952 Postmenopausal atrophic vaginitis: Secondary | ICD-10-CM

## 2016-03-19 DIAGNOSIS — Z01419 Encounter for gynecological examination (general) (routine) without abnormal findings: Secondary | ICD-10-CM | POA: Diagnosis not present

## 2016-03-19 NOTE — Progress Notes (Signed)
    Brittney Walker May 07, 1951 [id number]        65 y.o.  G0P0  for annual exam.  Several issues noted below.  Past medical history,surgical history, problem list, medications, allergies, family history and social history were all reviewed and documented as reviewed in the EPIC chart.  ROS:  Performed with pertinent positives and negatives included in the history, assessment and plan.   Additional significant findings :  none   Exam: Caryn Bee assistant Filed Vitals:   03/19/16 1627  BP: 124/76  Height: 5\' 7"  (1.702 m)  Weight: 271 lb (122.925 kg)   General appearance:  Normal affect, orientation and appearance. Skin: Grossly normal HEENT: Without gross lesions.  No cervical or supraclavicular adenopathy. Thyroid normal.  Lungs:  Clear without wheezing, rales or rhonchi Cardiac: RR, without RMG Abdominal:  Soft, nontender, without masses, guarding, rebound, organomegaly or hernia Breasts:  Examined lying and sitting without masses, retractions, discharge or axillary adenopathy. Pelvic:  Ext/BUS/vagina with atrophic changes  Adnexa without masses or tenderness    Anus and perineum normal   Rectovaginal normal sphincter tone without palpated masses or tenderness.   Extremities with some swelling at the base of her left great toe. Has been on present on and off for a long time. Not overly painful to the patient but can be intermittently.     Assessment/Plan:  65 y.o. G0P0 female for annual exam.   1. Postmenopausal/atrophic genital changes. Status post [id number] for leiomyoma area doing well without significant hot flushes or night sweats. Continue to monitor report any issues. 2. History of VIN 1 2000. Vulvar exam is normal. Continue with self vulvar exams report any abnormalities. 3. Pap smear 2016. No Pap smear done today. No history of significant abnormal Pap smears. Options to stop screening altogether less frequent screening intervals reviewed. Will readdress on an annual  basis. 4. Mammography 03/2016. Continue with annual mammography when due. SBE monthly reviewed. 5. DEXA 2016 normal. Plan repeat DEXA at 5 year interval. Increased calcium vitamin D reviewed. 6. Colonoscopy 2015. Repeat at their recommended interval. 7. Swelling at the base of the left great toe. Comes and goes. Intermittently painful. Recommend she follow up with podiatry and she is going to call make an appointment as she has been seen before in their office. 8. Health maintenance. No routine blood work done. Patient needs to call and make an appointment to see Dr. Larose Kells and she agrees to do so and will have her blood work done through his office.   Anastasio Auerbach MD, 4:54 PM 03/19/2016

## 2016-03-19 NOTE — Patient Instructions (Signed)

## 2016-11-29 ENCOUNTER — Telehealth: Payer: Self-pay | Admitting: Internal Medicine

## 2016-11-29 MED ORDER — COLCHICINE 0.6 MG PO CAPS: 0.6000 mg | ORAL_CAPSULE | Freq: Two times a day (BID) | ORAL | 0 refills | Status: DC | PRN

## 2016-11-29 NOTE — Telephone Encounter (Signed)
Caller name: Relationship to patient: Self Can be reached: [phone]  Pharmacy:  [facility], [place] [phone] (Phone) [phone] (Fax)     Reason for call:Request refill on Colchicine 0.6 MG CAPS

## 2016-11-29 NOTE — Telephone Encounter (Signed)
Last OV 10/2015, CPE scheduled 12/03/2016, will refill Rx.

## 2016-12-03 ENCOUNTER — Ambulatory Visit (INDEPENDENT_AMBULATORY_CARE_PROVIDER_SITE_OTHER): Payer: BLUE CROSS/BLUE SHIELD | Admitting: Internal Medicine

## 2016-12-03 ENCOUNTER — Encounter: Payer: Self-pay | Admitting: Internal Medicine

## 2016-12-03 VITALS — BP 130/78 | HR 78 | Temp 98.1°F | Resp 14 | Ht 67.0 in | Wt 280.1 lb

## 2016-12-03 DIAGNOSIS — E559 Vitamin D deficiency, unspecified: Secondary | ICD-10-CM

## 2016-12-03 DIAGNOSIS — Z0000 Encounter for general adult medical examination without abnormal findings: Secondary | ICD-10-CM | POA: Diagnosis not present

## 2016-12-03 DIAGNOSIS — Z1159 Encounter for screening for other viral diseases: Secondary | ICD-10-CM

## 2016-12-03 DIAGNOSIS — Z23 Encounter for immunization: Secondary | ICD-10-CM | POA: Diagnosis not present

## 2016-12-03 DIAGNOSIS — E042 Nontoxic multinodular goiter: Secondary | ICD-10-CM

## 2016-12-03 DIAGNOSIS — M109 Gout, unspecified: Secondary | ICD-10-CM

## 2016-12-03 MED ORDER — INDOMETHACIN 50 MG PO CAPS: 50.0000 mg | ORAL_CAPSULE | Freq: Three times a day (TID) | ORAL | 0 refills | Status: DC | PRN

## 2016-12-03 MED ORDER — COLCHICINE 0.6 MG PO CAPS: 0.6000 mg | ORAL_CAPSULE | Freq: Two times a day (BID) | ORAL | 3 refills | Status: DC | PRN

## 2016-12-03 NOTE — Progress Notes (Signed)
Subjective:    Patient ID: Brittney Walker, female    DOB: 06/04/1951, 66 y.o.   MRN: [id number]  DOS:  12/03/2016 Type of visit - description : cpx Interval history: Last office visit 2016.  States she is doing okay. Reports episode of gout on and off, randomly,  last one was at the right foot, started about 2 weeks ago, is already getting better. Needs a refill on colchicine.  Review of Systems Constitutional: No fever. No chills. No unexplained wt changes. No unusual sweats  HEENT: No dental problems, no ear discharge, no facial swelling, no voice changes. No eye discharge, no eye  redness , no  intolerance to light   Respiratory: No wheezing , no  difficulty breathing. No cough , no mucus production  Cardiovascular: No CP, no leg swelling , no  Palpitations  GI: no nausea, no vomiting, no diarrhea , no  abdominal pain.  No blood in the stools. No dysphagia, no odynophagia    Endocrine: No polyphagia, no polyuria , no polydipsia  GU: No dysuria, gross hematuria, difficulty urinating. No urinary urgency, no frequency.  Musculoskeletal: see above   Skin: No change in the color of the skin, palor , no  Rash  Allergic, immunologic: No environmental allergies , no  food allergies  Neurological: No dizziness no  syncope. No headaches. No diplopia, no slurred, no slurred speech, no motor deficits, no facial  Numbness  Hematological: No enlarged lymph nodes, no easy bruising , no unusual bleedings  Psychiatry: No suicidal ideas, no hallucinations, no beavior problems, no confusion.  No unusual/severe anxiety, no depression    Past Medical History:  Diagnosis Date  . Carotid bruit    w/ neg dopplers   . Thyroid disease     s/p RAI 2004  . 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 Vitamin D deficiency     Past Surgical History:  Procedure Laterality Date  . ABDOMINAL HYSTERECTOMY  1997   TAH-LEIOMYOMATA-- no BSO  . BIOPSY THYROID      bx of largest  nodule 2005, benign  . BREAST SURGERY     x's 2--fibrocystic dz  . TONSILLECTOMY      Social History   Social History  . Marital status: Single    Spouse name: N/A  . Number of children: 0  . Years of education: N/A   Occupational History  . collections     Social History Main Topics  . Smoking status: Never Smoker  . Smokeless tobacco: Never Used  . Alcohol use No  . Drug use: No  . Sexual activity: No     Comment: Virgin   Other Topics Concern  . Not on file   Social History Narrative   Lives by herself      Family History  Problem Relation Age of Onset  . Seizures Father     Epilepsy  . Hypertension Father   . Heart attack Father     ?  Marland Kitchen Breast cancer Sister 9  . Heart disease Mother   . Hypertension Mother   . Colon cancer Neg Hx   . Pancreatic cancer Neg Hx   . Esophageal cancer Neg Hx   . Prostate cancer Neg Hx   . Rectal cancer Neg Hx      Allergies as of 12/03/2016   No Known Allergies     Medication List       Accurate as of 12/03/16 11:59 PM. Always use your most  recent med list.          calcium carbonate 1250 (500 Ca) MG tablet Commonly known as:  OS-CAL - dosed in mg of elemental calcium Take 1 tablet by mouth daily with breakfast.   cholecalciferol 1000 units tablet Commonly known as:  VITAMIN D Take 1,000 Units by mouth daily.   Colchicine 0.6 MG Caps Take 0.6 mg by mouth 2 (two) times daily as needed (GOUT).   indomethacin 50 MG capsule Commonly known as:  INDOCIN Take 1 capsule (50 mg total) by mouth 3 (three) times daily as needed.   multivitamin capsule Take 1 capsule by mouth daily.   vitamin A 10000 UNIT capsule Take 10,000 Units by mouth daily.   vitamin B-12 1000 MCG tablet Commonly known as:  CYANOCOBALAMIN Take 1,000 mcg by mouth daily.   vitamin C 500 MG tablet Commonly known as:  ASCORBIC ACID Take 1,000 mg by mouth daily.   vitamin E 400 UNIT capsule Take 400 Units by mouth daily.            Objective:   Physical Exam BP 130/78 (BP Location: Left Arm, Patient Position: Sitting, Cuff Size: Normal)   Pulse 78   Temp 98.1 F (36.7 C) (Oral)   Resp 14   Ht 5\' 7"  (1.702 m)   Wt 280 lb 2 oz (127.1 kg)   LMP 11/05/1995   SpO2 95%   BMI 43.87 kg/m   General:   Well developed, well nourished . NAD.  Neck: Slightly palpable thyroid when she swallows. Not tender  HEENT:  Normocephalic . Face symmetric, atraumatic Lungs:  CTA B Normal respiratory effort, no intercostal retractions, no accessory muscle use. Heart: RRR,  no murmur.  No pretibial edema but she does have bilateral pedal edema is slightly worse on the left. Good pedal pulses. Abdomen:  Not distended, soft, non-tender. No rebound or rigidity.   Skin: Exposed areas without rash. Not pale. Not jaundice MSK: Base of the right great toe slightly warm, slightly TTP. No major swelling. Neurologic:  alert & oriented X3.  Speech normal, gait appropriate for age and unassisted Strength symmetric and appropriate for age.  Psych: Cognition and judgment appear intact.  Cooperative with normal attention span and concentration.  Behavior appropriate. No anxious or depressed appearing.    Assessment & Plan:   Assessment Gout Thyroid disease,  -RAI 2004 -[place] 10-2014-- new nodule, Bx unsuccessful 12-2014 H/o  VIN I,  2000 Vitamin D deficiency Carotid bruit w/ negative Dopplers  PLAN Gout: Has a sporadic episodes, last started 2 weeks ago, right podagra, getting better. Recommend to take colchicine PRN. Cost may be an issue, Indocin is an alternative. See prescription. GI precautions discussed. Check a uric acid Thyroid disease: Unsuccessful biopsy 2016, check a ultrasound. Consider biopsy. Pedal  edema: Recommend low salt diet and leg elevation. rec to stay active. Vitamin D def: Recommend vitamin D supplements, check labs RTC 6 months

## 2016-12-03 NOTE — Progress Notes (Signed)
Pre visit review using our clinic review tool, if applicable. No additional management support is needed unless otherwise documented below in the visit note. 

## 2016-12-03 NOTE — Patient Instructions (Addendum)
  GO TO THE FRONT DESK Schedule your next appointment for a  routine checkup in 6 months  Schedule labs to be done this week, fasting  ======  Gout: -Follow a diet -If you have an episode: Use colchicine as needed. -If you don't  have colchicine, try indometacin. Take it with food to prevent stomach irritation. If you have a stomach problem - stop  the medication and let me know  Leg swelling: Leg elevation, low-salt diet  Vitamin D deficiency: Taking vitamin D supplements 1000 units daily

## 2016-12-03 NOTE — Assessment & Plan Note (Addendum)
Td 11-2016, prevnar 11-2016; Flu shot today Had a colonoscopy 10-2014, negative, next 67 years Female care per gyn Last mammogram  03-2016 Diet and exercise discussed Labs: CMP, CBC, FLP, vitamin D, TSH.hep C

## 2016-12-04 DIAGNOSIS — Z09 Encounter for follow-up examination after completed treatment for conditions other than malignant neoplasm: Secondary | ICD-10-CM | POA: Insufficient documentation

## 2016-12-04 NOTE — Assessment & Plan Note (Signed)
Gout: Has a sporadic episodes, last started 2 weeks ago, right podagra, getting better. Recommend to take colchicine PRN. Cost may be an issue, Indocin is an alternative. See prescription. GI precautions discussed. Check a uric acid Thyroid disease: Unsuccessful biopsy 2016, check a ultrasound. Consider biopsy. Pedal  edema: Recommend low salt diet and leg elevation. rec to stay active. Vitamin D def: Recommend vitamin D supplements, check labs RTC 6 months

## 2016-12-10 ENCOUNTER — Other Ambulatory Visit: Payer: BLUE CROSS/BLUE SHIELD

## 2016-12-17 ENCOUNTER — Other Ambulatory Visit: Payer: BLUE CROSS/BLUE SHIELD

## 2017-01-21 ENCOUNTER — Ambulatory Visit (HOSPITAL_BASED_OUTPATIENT_CLINIC_OR_DEPARTMENT_OTHER): Payer: BLUE CROSS/BLUE SHIELD

## 2017-06-03 ENCOUNTER — Telehealth: Payer: Self-pay | Admitting: Internal Medicine

## 2017-06-03 ENCOUNTER — Ambulatory Visit: Payer: BLUE CROSS/BLUE SHIELD | Admitting: Internal Medicine

## 2017-06-03 NOTE — Telephone Encounter (Addendum)
Patient cancelled 4pm appointment for today due to patient experiencing diarrhea, patient Brittney Walker to 06/24/2017, charge or no charge

## 2017-06-03 NOTE — Telephone Encounter (Signed)
No charge. 

## 2017-06-24 ENCOUNTER — Encounter: Payer: Self-pay | Admitting: Internal Medicine

## 2017-06-24 ENCOUNTER — Ambulatory Visit (INDEPENDENT_AMBULATORY_CARE_PROVIDER_SITE_OTHER): Payer: BLUE CROSS/BLUE SHIELD | Admitting: Internal Medicine

## 2017-06-24 VITALS — BP 126/74 | HR 69 | Temp 98.0°F | Resp 14 | Ht 67.0 in | Wt 277.2 lb

## 2017-06-24 DIAGNOSIS — E039 Hypothyroidism, unspecified: Secondary | ICD-10-CM

## 2017-06-24 DIAGNOSIS — Z1159 Encounter for screening for other viral diseases: Secondary | ICD-10-CM

## 2017-06-24 DIAGNOSIS — E042 Nontoxic multinodular goiter: Secondary | ICD-10-CM | POA: Diagnosis not present

## 2017-06-24 DIAGNOSIS — E559 Vitamin D deficiency, unspecified: Secondary | ICD-10-CM | POA: Diagnosis not present

## 2017-06-24 DIAGNOSIS — M109 Gout, unspecified: Secondary | ICD-10-CM | POA: Diagnosis not present

## 2017-06-24 DIAGNOSIS — Z0000 Encounter for general adult medical examination without abnormal findings: Secondary | ICD-10-CM

## 2017-06-24 NOTE — Progress Notes (Signed)
Pre visit review using our clinic review tool, if applicable. No additional management support is needed unless otherwise documented below in the visit note. 

## 2017-06-24 NOTE — Patient Instructions (Signed)
GO TO THE LAB : Get the blood work     GO TO THE FRONT DESK Schedule your next appointment for a  physical exam by 11-2017  We are arranging for ultrasound of the thyroid. If you don't hear from them within the next few days please call this office.

## 2017-06-24 NOTE — Progress Notes (Signed)
Subjective:    Patient ID: Brittney Walker, female    DOB: 07-Oct-1951, 66 y.o.   MRN: [id number]  DOS:  06/24/2017 Type of visit - description : rov Interval history: gout: No recent episodes, has medications on standby to use prn Lower extremity edema: Still there but not as severe, only late in the afternoons. Thyroid nodule? Patient had to cancel her ultrasound. Would like to reschedule.   Review of Systems Denies chest pain or difficulty breathing.  Past Medical History:  Diagnosis Date  . Carotid bruit    w/ neg dopplers   . Thyroid disease     s/p RAI 2004  . 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 Vitamin D deficiency     Past Surgical History:  Procedure Laterality Date  . ABDOMINAL HYSTERECTOMY  1997   TAH-LEIOMYOMATA-- no BSO  . BIOPSY THYROID      bx of largest nodule 2005, benign  . BREAST SURGERY     x's 2--fibrocystic dz  . TONSILLECTOMY      Social History   Social History  . Marital status: Single    Spouse name: N/A  . Number of children: 0  . Years of education: N/A   Occupational History  . collections     Social History Main Topics  . Smoking status: Never Smoker  . Smokeless tobacco: Never Used  . Alcohol use No  . Drug use: No  . Sexual activity: No     Comment: Virgin   Other Topics Concern  . Not on file   Social History Narrative   Lives by herself       Allergies as of 06/24/2017   No Known Allergies     Medication List       Accurate as of 06/24/17 11:59 PM. Always use your most recent med list.          calcium carbonate 1250 (500 Ca) MG tablet Commonly known as:  OS-CAL - dosed in mg of elemental calcium Take 1 tablet by mouth daily with breakfast.   cholecalciferol 1000 units tablet Commonly known as:  VITAMIN D Take 1,000 Units by mouth daily.   Colchicine 0.6 MG Caps Take 0.6 mg by mouth 2 (two) times daily as needed (GOUT).   indomethacin 50 MG capsule Commonly known as:   INDOCIN Take 1 capsule (50 mg total) by mouth 3 (three) times daily as needed.   multivitamin capsule Take 1 capsule by mouth daily.   vitamin A 10000 UNIT capsule Take 10,000 Units by mouth daily.   vitamin B-12 1000 MCG tablet Commonly known as:  CYANOCOBALAMIN Take 1,000 mcg by mouth daily.   vitamin C 500 MG tablet Commonly known as:  ASCORBIC ACID Take 1,000 mg by mouth daily.   vitamin E 400 UNIT capsule Take 400 Units by mouth daily.            Discharge Care Instructions        Start     Ordered   06/24/17 0000  Comp Met (CMET)     06/24/17 1638   06/24/17 0000  Lipid panel     06/24/17 1638   06/24/17 0000  CBC w/Diff     06/24/17 1638   06/24/17 0000  TSH     06/24/17 1638   06/24/17 0000  Hepatitis C antibody     06/24/17 1638   06/24/17 0000  Uric acid     06/24/17 1638   06/24/17 0000  Vitamin D 1,25 dihydroxy     06/24/17 1638   06/24/17 0000  US THYROID    Question Answer Comment  Reason for Exam (SYMPTOM  OR DIAGNOSIS REQUIRED) goiter   Preferred imaging location? [facility]      06/24/17 1641         Objective:   Physical Exam BP 126/74 (BP Location: Left Arm, Patient Position: Sitting, Cuff Size: Normal)   Pulse 69   Temp 98 F (36.7 C) (Oral)   Resp 14   Ht '5\' 7"'  (1.702 m)   Wt 277 lb 4 oz (125.8 kg)   LMP 11/05/1995   SpO2 96%   BMI 43.42 kg/m  General:   Well developed, no jugular venous appearing. NAD.  HEENT:  Normocephalic . Face symmetric, atraumatic Neck: Barely palpable thyroid gland when she swallows, on the right. Not tender Lungs:  CTA B Normal respiratory effort, no intercostal retractions, no accessory muscle use. Heart: RRR,  no murmur.  No pretibial edema bilaterally . + Portion edema, worse on the left Skin: Not pale. Not jaundice Neurologic:  alert & oriented X3.  Speech normal, gait appropriate for age and unassisted Psych--  Cognition and judgment appear intact.  Cooperative with normal  attention span and concentration.  Behavior appropriate. No anxious or depressed appearing.      Assessment & Plan:   Assessment Gout Thyroid disease,  -RAI 2004 -[place] 10-2014-- new nodule, Bx unsuccessful 12-2014 Morbid obesity  H/o  VIN I,  2000 Vitamin D deficiency Carotid bruit w/ negative Dopplers  PLAN Will draw all the labs that were recommended at the beginning of the year for CPX. Gout: No recent events, checking a uric acid Thyroid disease: Today, the thyroid gland is barely palpable, failed the last referral for a [place]. We'll try again Morbid obesity: Recommend to consider to be seen at the bariatric clinic. Vitamin D deficiency: on  Supplements, checking labs. Edema: At baseline. RTC 11-2017, CPX

## 2017-06-25 LAB — COMPREHENSIVE METABOLIC PANEL
ALBUMIN: 4.2 g/dL (ref 3.5–5.2)
ALT: 21 U/L (ref 0–35)
AST: 22 U/L (ref 0–37)
Alkaline Phosphatase: 47 U/L (ref 39–117)
BUN: 13 mg/dL (ref 6–23)
CO2: 29 mEq/L (ref 19–32)
CREATININE: 0.83 mg/dL (ref 0.40–1.20)
Calcium: 9.7 mg/dL (ref 8.4–10.5)
Chloride: 105 mEq/L (ref 96–112)
GFR: 88.05 mL/min (ref >60.00)
GLUCOSE: 104 mg/dL — AB (ref 70–99)
Potassium: 3.8 mEq/L (ref 3.5–5.1)
SODIUM: 140 meq/L (ref 135–145)
Total Bilirubin: 0.5 mg/dL (ref 0.2–1.2)
Total Protein: 8.1 g/dL (ref 6.0–8.3)

## 2017-06-25 LAB — CBC WITH DIFFERENTIAL/PLATELET
BASOS PCT: 0.6 % (ref 0.0–3.0)
Basophils Absolute: 0 10*3/uL (ref 0.0–0.1)
EOS ABS: 0.1 10*3/uL (ref 0.0–0.7)
EOS PCT: 2.3 % (ref 0.0–5.0)
HEMATOCRIT: 39.4 % (ref 36.0–46.0)
HEMOGLOBIN: 12.9 g/dL (ref 12.0–15.0)
LYMPHS PCT: 22.3 % (ref 12.0–46.0)
Lymphs Abs: 1.4 10*3/uL (ref 0.7–4.0)
MCHC: 32.8 g/dL (ref 30.0–36.0)
MCV: 90.2 fl (ref 78.0–100.0)
MONO ABS: 0.5 10*3/uL (ref 0.1–1.0)
Monocytes Relative: 7.5 % (ref 3.0–12.0)
Neutro Abs: 4.1 10*3/uL (ref 1.4–7.7)
Neutrophils Relative %: 67.3 % (ref 43.0–77.0)
PLATELETS: 169 10*3/uL (ref 150.0–400.0)
RBC: 4.37 Mil/uL (ref 3.87–5.11)
RDW: 14.4 % (ref 11.5–15.5)
WBC: 6.1 10*3/uL (ref 4.0–10.5)

## 2017-06-25 LAB — LIPID PANEL
Cholesterol: 183 mg/dL (ref 0–200)
HDL: 51.3 mg/dL (ref >39.00)
LDL Cholesterol: 120 mg/dL — ABNORMAL HIGH (ref 0–99)
NONHDL: 131.79
Total CHOL/HDL Ratio: 4
Triglycerides: 59 mg/dL (ref 0.0–149.0)
VLDL: 11.8 mg/dL (ref 0.0–40.0)

## 2017-06-25 LAB — TSH: TSH: 1.99 u[IU]/mL (ref 0.35–4.50)

## 2017-06-25 LAB — URIC ACID: URIC ACID, SERUM: 8.5 mg/dL — AB (ref 2.4–7.0)

## 2017-06-25 LAB — HEPATITIS C ANTIBODY: HCV AB: NONREACTIVE

## 2017-06-25 NOTE — Assessment & Plan Note (Signed)
Will draw all the labs that were recommended at the beginning of the year for CPX. Gout: No recent events, checking a uric acid Thyroid disease: Today, the thyroid gland is barely palpable, failed the last referral for a [place]. We'll try again Morbid obesity: Recommend to consider to be seen at the bariatric clinic. Vitamin D deficiency: on  Supplements, checking labs. Edema: At baseline. RTC 11-2017, CPX

## 2017-06-27 LAB — VITAMIN D 1,25 DIHYDROXY
Vitamin D 1, 25 (OH)2 Total: 35 pg/mL (ref 18–72)
Vitamin D3 1, 25 (OH)2: 35 pg/mL

## 2017-12-09 ENCOUNTER — Ambulatory Visit (HOSPITAL_BASED_OUTPATIENT_CLINIC_OR_DEPARTMENT_OTHER)
Admission: RE | Admit: 2017-12-09 | Discharge: 2017-12-09 | Disposition: A | Discharge status: Home / Self Care | Payer: BLUE CROSS/BLUE SHIELD | Source: Ambulatory Visit | Attending: Internal Medicine | Admitting: Internal Medicine

## 2017-12-09 ENCOUNTER — Encounter: Payer: Self-pay | Admitting: Internal Medicine

## 2017-12-09 ENCOUNTER — Ambulatory Visit (INDEPENDENT_AMBULATORY_CARE_PROVIDER_SITE_OTHER): Payer: BLUE CROSS/BLUE SHIELD | Admitting: Internal Medicine

## 2017-12-09 VITALS — BP 118/68 | HR 68 | Temp 97.9°F | Resp 14 | Ht 67.0 in | Wt 271.0 lb

## 2017-12-09 DIAGNOSIS — E010 Iodine-deficiency related diffuse (endemic) goiter: Secondary | ICD-10-CM | POA: Insufficient documentation

## 2017-12-09 DIAGNOSIS — Z0000 Encounter for general adult medical examination without abnormal findings: Secondary | ICD-10-CM | POA: Diagnosis not present

## 2017-12-09 DIAGNOSIS — Z23 Encounter for immunization: Secondary | ICD-10-CM | POA: Diagnosis not present

## 2017-12-09 DIAGNOSIS — E042 Nontoxic multinodular goiter: Secondary | ICD-10-CM | POA: Diagnosis not present

## 2017-12-09 NOTE — Patient Instructions (Addendum)
  GO TO THE FRONT DESK Schedule your next appointment for a physical exam in 1 year  Please to schedule the thyroid ultrasound  Consider see one of the bariatric offices in town

## 2017-12-09 NOTE — Assessment & Plan Note (Addendum)
-  Td 11-2016, pnm 23: 2016; prevnar 11-2016; Flu shot today; shingrix not available  -CCS: Had a colonoscopy 10-2014, negative, next 10 years --Female care: due to see gyn, due for a MMG, plans to call -DEXA 2016 @ gyn: wnl -Diet and exercise discussed -Labs: Reviewed recent labs , all okay. -EKG: NSR.  Poor R wave progression, not far from baseline.

## 2017-12-09 NOTE — Progress Notes (Signed)
Pre visit review using our clinic review tool, if applicable. No additional management support is needed unless otherwise documented below in the visit note. 

## 2017-12-09 NOTE — Progress Notes (Signed)
Subjective:    Patient ID: Brittney Walker, female    DOB: Jun 18, 1951, 67 y.o.   MRN: [id number]  DOS:  12/09/2017 Type of visit - description : CPX Interval history: In general feeling well, has no major concerns   Review of Systems  Developed a URI 2 weeks ago, initially had fever chills, that is resolved, still has some cough and nasal discharge.  Sputum and discharge are white in color.  Other than above, a 14 point review of systems is negative     Past Medical History:  Diagnosis Date  . Carotid bruit    w/ neg dopplers   . Thyroid disease     s/p RAI 2004  . 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 Vitamin D deficiency     Past Surgical History:  Procedure Laterality Date  . ABDOMINAL HYSTERECTOMY  1997   TAH-LEIOMYOMATA-- no BSO  . BIOPSY THYROID      bx of largest nodule 2005, benign  . BREAST SURGERY     x's 2--fibrocystic dz  . TONSILLECTOMY      Social History   Socioeconomic History  . Marital status: Single    Spouse name: Not on file  . Number of children: 0  . Years of education: Not on file  . Highest education level: Not on file  Social Needs  . Financial resource strain: Not on file  . Food insecurity - worry: Not on file  . Food insecurity - inability: Not on file  . Transportation needs - medical: Not on file  . Transportation needs - non-medical: Not on file  Occupational History  . Occupation: collections   Tobacco Use  . Smoking status: Never Smoker  . Smokeless tobacco: Never Used  Substance and Sexual Activity  . Alcohol use: No    Alcohol/week: 0.0 oz  . Drug use: No  . Sexual activity: No    Comment: Virgin  Other Topics Concern  . Not on file  Social History Narrative   Lives by herself      Family History  Problem Relation Age of Onset  . Seizures Father        Epilepsy  . Hypertension Father   . Heart attack Father        ?  Marland Kitchen Breast cancer Sister 70  . Heart disease Mother   . Hypertension  Mother   . Colon cancer Neg Hx   . Pancreatic cancer Neg Hx   . Esophageal cancer Neg Hx   . Prostate cancer Neg Hx   . Rectal cancer Neg Hx      Allergies as of 12/09/2017   No Known Allergies     Medication List        Accurate as of 12/09/17 11:59 PM. Always use your most recent med list.          calcium carbonate 1250 (500 Ca) MG tablet Commonly known as:  OS-CAL - dosed in mg of elemental calcium Take 1 tablet by mouth daily with breakfast.   cholecalciferol 1000 units tablet Commonly known as:  VITAMIN D Take 1,000 Units by mouth daily.   Colchicine 0.6 MG Caps Take 0.6 mg by mouth 2 (two) times daily as needed (GOUT).   indomethacin 50 MG capsule Commonly known as:  INDOCIN Take 1 capsule (50 mg total) by mouth 3 (three) times daily as needed.   multivitamin capsule Take 1 capsule by mouth daily.   vitamin A 10000 UNIT capsule  Take 10,000 Units by mouth daily.   vitamin B-12 1000 MCG tablet Commonly known as:  CYANOCOBALAMIN Take 1,000 mcg by mouth daily.   vitamin C 500 MG tablet Commonly known as:  ASCORBIC ACID Take 1,000 mg by mouth daily.   vitamin E 400 UNIT capsule Take 400 Units by mouth daily.          Objective:   Physical Exam BP 118/68 (BP Location: Left Arm, Patient Position: Sitting, Cuff Size: Normal)   Pulse 68   Temp 97.9 F (36.6 C) (Oral)   Resp 14   Ht 5\' 7"  (1.702 m)   Wt 271 lb (122.9 kg)   LMP 11/05/1995   SpO2 98%   BMI 42.44 kg/m  General:   Well developed, slightly obese female. NAD.  Neck: Barely palpable thyroid when she swallows, slightly more noticeable on the right HEENT:  Normocephalic . Face symmetric, atraumatic Lungs:  CTA B Normal respiratory effort, no intercostal retractions, no accessory muscle use. Heart: RRR,  no murmur.  No pretibial edema bilaterally ; some pedal edema  Abdomen:  Not distended, soft, non-tender. No rebound or rigidity.   Skin: Exposed areas without rash. Not pale. Not  jaundice Neurologic:  alert & oriented X3.  Speech normal, gait appropriate for age and unassisted Strength symmetric and appropriate for age.  Psych: Cognition and judgment appear intact.  Cooperative with normal attention span and concentration.  Behavior appropriate. No anxious or depressed appearing.     Assessment & Plan:   Assessment Gout Thyroid disease,  -RAI 2004 - Bx 2005 neg -[place] 10-2014-- new nodule, Bx unsuccessful 12-2014 Morbid obesity  H/o  VIN I,  2000 Vitamin D deficiency Carotid bruit w/ negative Dopplers  PLAN Gout: No recent episodes.  Uric acid is slightly elevated. Thyroid disease: failed to pursue a [place], plans to call and set it up.  New order enter. Vitamin D deficiency: Last check was okay, continue supplements Morbid obesity: Consider a visit to the bariatric office.info provided  RTC 1 year

## 2017-12-10 NOTE — Assessment & Plan Note (Signed)
Gout: No recent episodes.  Uric acid is slightly elevated. Thyroid disease: failed to pursue a [place], plans to call and set it up.  New order enter. Vitamin D deficiency: Last check was okay, continue supplements Morbid obesity: Consider a visit to the bariatric office.info provided  RTC 1 year

## 2017-12-12 ENCOUNTER — Encounter: Payer: Self-pay | Admitting: Emergency Medicine

## 2018-04-21 DIAGNOSIS — H40023 Open angle with borderline findings, high risk, bilateral: Secondary | ICD-10-CM | POA: Diagnosis not present

## 2018-08-18 DIAGNOSIS — H00015 Hordeolum externum left lower eyelid: Secondary | ICD-10-CM | POA: Diagnosis not present

## 2018-09-01 DIAGNOSIS — Z1231 Encounter for screening mammogram for malignant neoplasm of breast: Secondary | ICD-10-CM | POA: Diagnosis not present

## 2018-09-01 DIAGNOSIS — Z803 Family history of malignant neoplasm of breast: Secondary | ICD-10-CM | POA: Diagnosis not present

## 2018-09-03 ENCOUNTER — Encounter: Payer: Self-pay | Admitting: Gynecology

## 2018-09-04 ENCOUNTER — Telehealth: Payer: Self-pay | Admitting: *Deleted

## 2018-09-04 NOTE — Telephone Encounter (Signed)
Received Mammogram results from [facility]; forwarded to provider/SLS 11/01

## 2018-09-18 ENCOUNTER — Telehealth: Payer: Self-pay | Admitting: *Deleted

## 2018-09-18 ENCOUNTER — Other Ambulatory Visit: Payer: Self-pay | Admitting: Internal Medicine

## 2018-09-18 NOTE — Telephone Encounter (Signed)
Patient and left message in triage voicemail asking for a return call at [phone]  I called this # and voicemail was full, I called home # and the phone rang with no voicemail.

## 2018-09-18 NOTE — Telephone Encounter (Signed)
Requested medication (s) are due for refill today: yes}  Requested medication (s) are on the active medication list: yes  Last refill:  12/03/16  expired  Future visit scheduled: no  Notes to clinic: expired RX    Requested Prescriptions  Pending Prescriptions Disp Refills   Colchicine 0.6 MG CAPS 60 capsule 3    Sig: Take 0.6 mg by mouth 2 (two) times daily as needed (GOUT).     Endocrinology:  Gout Agents Failed - 09/18/2018  3:37 PM      Failed - Uric Acid in normal range and within 360 days    Uric Acid, Serum  Date Value Ref Range Status  06/24/2017 8.5 (H) 2.4 - 7.0 mg/dL Final         Failed - Cr in normal range and within 360 days    Creat  Date Value Ref Range Status  02/14/2012 0.82 0.50 - 1.10 mg/dL Final   Creatinine, Ser  Date Value Ref Range Status  06/24/2017 0.83 0.40 - 1.20 mg/dL Final         Failed - Valid encounter within last 12 months    Recent Outpatient Visits          9 months ago Annual physical exam   [facility] at [facility], [place]   1 year ago GOITER, Psychologist, occupational at [place], [place]   1 year ago Annual physical exam   Archivist at [place], [place]   2 years ago Acute idiopathic gout of left foot   [facility] at [place], [place]   3 years ago Pain in joint, ankle and foot, left   [facility] at [place], [place]

## 2018-09-18 NOTE — Telephone Encounter (Signed)
Copied from [place] [phone]. Topic: Quick Communication - Rx Refill/Question >> Sep 18, 2018  2:37 PM Alfredia Ferguson R wrote: Medication: Colchicine 0.6 MG CAPS  Has the patient contacted their pharmacy? Yes , script is discontinued  Preferred Pharmacy (with phone number or street name):  [id number] [address], [place] [phone] (Phone) [phone] (Fax)      Agent: Please be advised that RX refills may take up to 3 business days. We ask that you follow-up with your pharmacy.

## 2018-09-21 MED ORDER — COLCHICINE 0.6 MG PO CAPS: 0.6000 mg | ORAL_CAPSULE | Freq: Two times a day (BID) | ORAL | 3 refills | Status: DC | PRN

## 2018-10-12 DIAGNOSIS — H2513 Age-related nuclear cataract, bilateral: Secondary | ICD-10-CM | POA: Diagnosis not present

## 2018-10-14 LAB — HM MAMMOGRAPHY

## 2018-10-16 ENCOUNTER — Encounter: Payer: BLUE CROSS/BLUE SHIELD | Admitting: Gynecology

## 2018-10-16 DIAGNOSIS — Z0289 Encounter for other administrative examinations: Secondary | ICD-10-CM

## 2018-10-22 ENCOUNTER — Encounter: Payer: Self-pay | Admitting: Internal Medicine

## 2018-12-03 ENCOUNTER — Encounter: Payer: BLUE CROSS/BLUE SHIELD | Admitting: Gynecology

## 2018-12-03 DIAGNOSIS — Z0289 Encounter for other administrative examinations: Secondary | ICD-10-CM

## 2018-12-07 ENCOUNTER — Ambulatory Visit (INDEPENDENT_AMBULATORY_CARE_PROVIDER_SITE_OTHER): Payer: BLUE CROSS/BLUE SHIELD | Admitting: Gynecology

## 2018-12-07 ENCOUNTER — Encounter: Payer: Self-pay | Admitting: Gynecology

## 2018-12-07 VITALS — BP 124/80 | Ht 66.0 in | Wt 241.0 lb

## 2018-12-07 DIAGNOSIS — N952 Postmenopausal atrophic vaginitis: Secondary | ICD-10-CM

## 2018-12-07 DIAGNOSIS — Z01419 Encounter for gynecological examination (general) (routine) without abnormal findings: Secondary | ICD-10-CM

## 2018-12-07 NOTE — Patient Instructions (Signed)
Follow-up in 1 year for annual exam, sooner as needed. 

## 2018-12-07 NOTE — Addendum Note (Signed)
Addended by: Nelva Nay on: 12/07/2018 04:40 PM   Modules accepted: Orders

## 2018-12-07 NOTE — Progress Notes (Signed)
    Brittney Walker Dec 14, 1950 [id number]        67 y.o.  G0P0 for annual gynecologic exam.  Without gynecologic complaints  Past medical history,surgical history, problem list, medications, allergies, family history and social history were all reviewed and documented as reviewed in the EPIC chart.  ROS:  Performed with pertinent positives and negatives included in the history, assessment and plan.   Additional significant findings : None   Exam: Caryn Bee assistant Vitals:   12/07/18 1420  BP: 124/80  Weight: 241 lb (109.3 kg)  Height: 5\' 6"  (1.676 m)   Body mass index is 38.9 kg/m.  General appearance:  Normal affect, orientation and appearance. Skin: Grossly normal HEENT: Without gross lesions.  No cervical or supraclavicular adenopathy. Thyroid normal.  Lungs:  Clear without wheezing, rales or rhonchi Cardiac: RR, without RMG Abdominal:  Soft, nontender, without masses, guarding, rebound, organomegaly or hernia Breasts:  Examined lying and sitting without masses, retractions, discharge or axillary adenopathy. Pelvic:  Ext, BUS, Vagina: Normal with atrophic changes  Adnexa: Without masses or tenderness    Anus and perineum: Normal   Rectovaginal: Normal sphincter tone without palpated masses or tenderness.    Assessment/Plan:  68 y.o. G0P0 female for annual gynecologic exam.   1. Postmenopausal.  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  Doing well without menopausal symptoms. 2. Pap smear 2016.  Pap smear of vaginal cuff done today.  No history of significant abnormal Pap smears.  Options to stop screening per current screening guidelines reviewed.  Will readdress on an annual basis. 3. Mammography 08/2018.  Continue with annual mammography when due.  Breast exam normal today. 4. DEXA 2016 normal.  Plan repeat DEXA at 5-year interval. 5. Colonoscopy 2015.  Repeat at their recommended interval. 6. Health maintenance.  No routine lab work done as patient does this  elsewhere.  Follow-up 1 year, sooner as needed.   Anastasio Auerbach MD, 3:22 PM 12/07/2018

## 2018-12-08 LAB — PAP IG W/ RFLX HPV ASCU

## 2018-12-29 ENCOUNTER — Telehealth: Payer: Self-pay

## 2018-12-29 ENCOUNTER — Other Ambulatory Visit: Payer: Self-pay

## 2018-12-29 DIAGNOSIS — E041 Nontoxic single thyroid nodule: Secondary | ICD-10-CM

## 2018-12-29 NOTE — Telephone Encounter (Signed)
Spoke w/ Pt- informed that she is due for ultrasound follow-up on her thyroid. Informed that order has been placed and she will receive call to schedule. Pt verbalized understanding.

## 2019-01-06 ENCOUNTER — Ambulatory Visit (HOSPITAL_BASED_OUTPATIENT_CLINIC_OR_DEPARTMENT_OTHER): Payer: BLUE CROSS/BLUE SHIELD

## 2019-01-12 ENCOUNTER — Ambulatory Visit (HOSPITAL_BASED_OUTPATIENT_CLINIC_OR_DEPARTMENT_OTHER)
Admission: RE | Admit: 2019-01-12 | Discharge: 2019-01-12 | Disposition: A | Discharge status: Home / Self Care | Payer: BLUE CROSS/BLUE SHIELD | Source: Ambulatory Visit | Attending: Internal Medicine | Admitting: Internal Medicine

## 2019-01-12 DIAGNOSIS — E042 Nontoxic multinodular goiter: Secondary | ICD-10-CM | POA: Diagnosis not present

## 2019-01-12 DIAGNOSIS — E041 Nontoxic single thyroid nodule: Secondary | ICD-10-CM | POA: Insufficient documentation

## 2019-02-26 ENCOUNTER — Telehealth: Payer: Self-pay

## 2019-02-26 NOTE — Telephone Encounter (Signed)
Tried calling Pt to offer cpe virtual visit (bcbs)- no answer, voicemail box is full.

## 2019-06-01 ENCOUNTER — Encounter: Payer: Self-pay | Admitting: Internal Medicine

## 2019-08-11 ENCOUNTER — Encounter: Payer: Self-pay | Admitting: Gynecology

## 2019-10-18 ENCOUNTER — Encounter: Payer: BLUE CROSS/BLUE SHIELD | Admitting: Gynecology

## 2019-12-09 ENCOUNTER — Encounter: Payer: BLUE CROSS/BLUE SHIELD | Admitting: Obstetrics and Gynecology

## 2019-12-09 ENCOUNTER — Encounter: Payer: Self-pay | Admitting: Obstetrics and Gynecology

## 2020-02-24 ENCOUNTER — Encounter: Payer: Self-pay | Admitting: Obstetrics and Gynecology

## 2020-03-30 ENCOUNTER — Encounter: Payer: Self-pay | Admitting: Obstetrics and Gynecology

## 2020-04-04 ENCOUNTER — Encounter: Payer: Self-pay | Admitting: Obstetrics and Gynecology

## 2020-04-04 DIAGNOSIS — Z0289 Encounter for other administrative examinations: Secondary | ICD-10-CM

## 2021-01-11 ENCOUNTER — Encounter: Payer: Self-pay | Admitting: Internal Medicine

## 2021-07-17 ENCOUNTER — Other Ambulatory Visit: Payer: Self-pay

## 2021-07-17 ENCOUNTER — Emergency Department (HOSPITAL_COMMUNITY)
Admission: EM | Admit: 2021-07-17 | Discharge: 2021-07-18 | Disposition: A | Discharge status: Home / Self Care | Payer: Self-pay | Attending: Emergency Medicine | Admitting: Emergency Medicine

## 2021-07-17 ENCOUNTER — Emergency Department (HOSPITAL_COMMUNITY): Payer: Self-pay

## 2021-07-17 ENCOUNTER — Encounter (HOSPITAL_COMMUNITY): Payer: Self-pay

## 2021-07-17 DIAGNOSIS — R0600 Dyspnea, unspecified: Secondary | ICD-10-CM

## 2021-07-17 DIAGNOSIS — I4891 Unspecified atrial fibrillation: Secondary | ICD-10-CM | POA: Insufficient documentation

## 2021-07-17 DIAGNOSIS — Z7901 Long term (current) use of anticoagulants: Secondary | ICD-10-CM | POA: Insufficient documentation

## 2021-07-17 DIAGNOSIS — I517 Cardiomegaly: Secondary | ICD-10-CM | POA: Insufficient documentation

## 2021-07-17 DIAGNOSIS — R2240 Localized swelling, mass and lump, unspecified lower limb: Secondary | ICD-10-CM | POA: Insufficient documentation

## 2021-07-17 DIAGNOSIS — R42 Dizziness and giddiness: Secondary | ICD-10-CM | POA: Insufficient documentation

## 2021-07-17 DIAGNOSIS — Z20822 Contact with and (suspected) exposure to covid-19: Secondary | ICD-10-CM | POA: Insufficient documentation

## 2021-07-17 DIAGNOSIS — R0789 Other chest pain: Secondary | ICD-10-CM | POA: Insufficient documentation

## 2021-07-17 DIAGNOSIS — R0609 Other forms of dyspnea: Secondary | ICD-10-CM | POA: Insufficient documentation

## 2021-07-17 DIAGNOSIS — J90 Pleural effusion, not elsewhere classified: Secondary | ICD-10-CM | POA: Insufficient documentation

## 2021-07-17 DIAGNOSIS — R062 Wheezing: Secondary | ICD-10-CM | POA: Insufficient documentation

## 2021-07-17 LAB — URINALYSIS, ROUTINE W REFLEX MICROSCOPIC
Bilirubin Urine: NEGATIVE
Glucose, UA: NEGATIVE mg/dL
Hgb urine dipstick: NEGATIVE
Ketones, ur: NEGATIVE mg/dL
Leukocytes,Ua: NEGATIVE
Nitrite: NEGATIVE
Protein, ur: 30 mg/dL — AB
Specific Gravity, Urine: 1.015 (ref 1.005–1.030)
pH: 6 (ref 5.0–8.0)

## 2021-07-17 LAB — COMPREHENSIVE METABOLIC PANEL
ALT: 32 U/L (ref 0–44)
AST: 40 U/L (ref 15–41)
Albumin: 3.7 g/dL (ref 3.5–5.0)
Alkaline Phosphatase: 123 U/L (ref 38–126)
Anion gap: 8 (ref 5–15)
BUN: 13 mg/dL (ref 8–23)
CO2: 27 mmol/L (ref 22–32)
Calcium: 8.8 mg/dL — ABNORMAL LOW (ref 8.9–10.3)
Chloride: 101 mmol/L (ref 98–111)
Creatinine, Ser: 0.84 mg/dL (ref 0.44–1.00)
GFR, Estimated: >60 mL/min (ref >60)
Glucose, Bld: 102 mg/dL — ABNORMAL HIGH (ref 70–99)
Potassium: 3.8 mmol/L (ref 3.5–5.1)
Sodium: 136 mmol/L (ref 135–145)
Total Bilirubin: 1.2 mg/dL (ref 0.3–1.2)
Total Protein: 7.7 g/dL (ref 6.5–8.1)

## 2021-07-17 LAB — BRAIN NATRIURETIC PEPTIDE: B Natriuretic Peptide: 304.5 pg/mL — ABNORMAL HIGH (ref 0.0–100.0)

## 2021-07-17 LAB — RESP PANEL BY RT-PCR (FLU A&B, COVID) ARPGX2
Influenza A by PCR: NEGATIVE
Influenza B by PCR: NEGATIVE
SARS Coronavirus 2 by RT PCR: NEGATIVE

## 2021-07-17 LAB — CBC WITH DIFFERENTIAL/PLATELET
Abs Immature Granulocytes: 0.01 10*3/uL (ref 0.00–0.07)
Basophils Absolute: 0 10*3/uL (ref 0.0–0.1)
Basophils Relative: 0 %
Eosinophils Absolute: 0.1 10*3/uL (ref 0.0–0.5)
Eosinophils Relative: 2 %
HCT: 34.8 % — ABNORMAL LOW (ref 36.0–46.0)
Hemoglobin: 11 g/dL — ABNORMAL LOW (ref 12.0–15.0)
Immature Granulocytes: 0 %
Lymphocytes Relative: 18 %
Lymphs Abs: 1.1 10*3/uL (ref 0.7–4.0)
MCH: 30.9 pg (ref 26.0–34.0)
MCHC: 31.6 g/dL (ref 30.0–36.0)
MCV: 97.8 fL (ref 80.0–100.0)
Monocytes Absolute: 0.9 10*3/uL (ref 0.1–1.0)
Monocytes Relative: 14 %
Neutro Abs: 4 10*3/uL (ref 1.7–7.7)
Neutrophils Relative %: 66 %
Platelets: 157 10*3/uL (ref 150–400)
RBC: 3.56 MIL/uL — ABNORMAL LOW (ref 3.87–5.11)
RDW: 14.9 % (ref 11.5–15.5)
WBC: 6.2 10*3/uL (ref 4.0–10.5)
nRBC: 0 % (ref 0.0–0.2)

## 2021-07-17 LAB — URINALYSIS, MICROSCOPIC (REFLEX)

## 2021-07-17 LAB — TSH: TSH: 3.383 u[IU]/mL (ref 0.350–4.500)

## 2021-07-17 LAB — D-DIMER, QUANTITATIVE: D-Dimer, Quant: 2.34 ug/mL-FEU — ABNORMAL HIGH (ref 0.00–0.50)

## 2021-07-17 LAB — TROPONIN I (HIGH SENSITIVITY)
Troponin I (High Sensitivity): 25 ng/L — ABNORMAL HIGH (ref <18)
Troponin I (High Sensitivity): 26 ng/L — ABNORMAL HIGH (ref <18)

## 2021-07-17 MED ORDER — IOHEXOL 350 MG/ML SOLN
100.0000 mL | Freq: Once | INTRAVENOUS | Status: AC | PRN
  Administered 2021-07-17: 100 mL via INTRAVENOUS

## 2021-07-17 NOTE — ED Notes (Signed)
Pt O2 sat > 98% while ambulating in room. Noted pt desat to 94% transitioning from sit to laying down. Pt needing limited assist w/ elevating BLE onto bed.

## 2021-07-17 NOTE — ED Triage Notes (Signed)
Pt complains of dizziness, leg swelling, and shortness of breath that has been going on for a while now per pt. Pt states that she has some insurance issues and could not afford a primary doctor visit.

## 2021-07-17 NOTE — ED Provider Notes (Signed)
Emergency Medicine Provider Triage Evaluation Note  Brittney Walker , a 70 y.o. female  was evaluated in triage.  Pt complains of palpitations, left chest pain that radiates to the left arm, SOB, near syncope. Hx of thyroid problems.  Review of Systems  Positive: SOB, palpitations, CP, near syncope, lower extremity swelling Negative: N/V/D/FEVERS/chills  Physical Exam  BP (!) 167/96 (BP Location: Left Arm)   Pulse 68   Temp 97.7 F (36.5 C) (Oral)   Resp 19   Ht 5' 6.5" (1.689 m)   Wt 118.8 kg   LMP 11/05/1995   SpO2 96%   BMI 41.65 kg/m  Gen:   Awake, no distress   Resp:  Normal effort  MSK:   Moves extremities without difficulty  Other:  Bilat LE edema 3+, pitting, rales in the lung bases. RRR no m/r/g  Medical Decision Making  Medically screening exam initiated at 8:23 PM.  Appropriate orders placed.  Brittney Walker was informed that the remainder of the evaluation will be completed by another provider, this initial triage assessment does not replace that evaluation, and the importance of remaining in the ED until their evaluation is complete.  This chart was dictated using voice recognition software, Dragon. Despite the best efforts of this provider to proofread and correct errors, errors may still occur which can change documentation meaning.     Brittney Walker 07/17/21 2026    Varney Biles, MD 07/20/21 1421

## 2021-07-18 MED ORDER — FUROSEMIDE 20 MG PO TABS: 20.0000 mg | ORAL_TABLET | Freq: Every day | ORAL | 0 refills | Status: DC

## 2021-07-18 MED ORDER — APIXABAN 5 MG PO TABS: 5.0000 mg | ORAL_TABLET | Freq: Two times a day (BID) | ORAL | 0 refills | Status: DC

## 2021-07-18 NOTE — ED Provider Notes (Addendum)
[facility] DEPT Provider Note   CSN: [id number] Arrival date & time: 07/17/21  1950     History Chief Complaint  Patient presents with   Shortness of Breath   Dizziness   Leg Swelling    Brittney Walker is a 70 y.o. female.  HPI     70 year old female comes in with chief complaint of shortness of breath, leg swelling and dizziness.  Patient has no significant medical history of, but has not seen a PCP in a while.  She reports that over the last several days she has had some shortness of breath.  Shortness of breath is typically present with exertion.  Prior to the summer, she was able to be more functional.  Now minimal exertion gets her short of breath.  With the exertion she is not having any chest pain, but today she did have some chest discomfort described as left-sided, tightness type pain that moved to her left arm.  That episode lasted for few minutes and resolved on its own.  There was no associated shortness of breath with the chest pain.  Chest pain was unprovoked.  Patient also denies any associated nausea.  Patient does indicate that she has had increased leg swelling over the last several days.  She also reports that she had an episode of dizziness earlier today, then she felt like she might faint.  She had no chest pain or palpitations with it.  Pt has no hx of PE, DVT and denies any exogenous hormone (testosterone / estrogen) use, long distance travels or surgery in the past 6 weeks, active cancer, recent immobilization.   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   Vitamin D deficiency     Patient Active Problem List   Diagnosis Date Noted   Morbid obesity ([place]) 12/10/2017   PCP NTES >>>>>>>>>>>>>>>>>>>>>>>> 12/04/2016   Pain in joint, ankle and foot 12/21/2014   Annual physical exam 10/14/2014    Vitamin D deficiency 02/14/2012   GOITER, MULTINODULAR 08/24/2009   Hypothyroidism 08/24/2009   HEMANGIOMA, SKIN 06/17/2007   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    Past Surgical History:  Procedure Laterality Date   ABDOMINAL HYSTERECTOMY  1997   TAH-LEIOMYOMATA-- no BSO   BIOPSY THYROID      bx of largest nodule 2005, benign   BREAST SURGERY     x's 2--fibrocystic dz   TONSILLECTOMY       OB History     Gravida  0   Para      Term      Preterm      AB      Living         SAB      IAB      Ectopic      Multiple      Live Births              Family History  Problem Relation Age of Onset   Seizures Father        Epilepsy   Hypertension Father    Heart attack Father        ?   Breast cancer Sister 34   Heart disease Mother    Hypertension Mother    Colon cancer Neg Hx    Pancreatic cancer Neg  Hx    Esophageal cancer Neg Hx    Prostate cancer Neg Hx    Rectal cancer Neg Hx     Social History   Tobacco Use   Smoking status: Never   Smokeless tobacco: Never  Vaping Use   Vaping Use: Never used  Substance Use Topics   Alcohol use: No    Alcohol/week: 0.0 standard drinks   Drug use: No    Home Medications Prior to Admission medications   Medication Sig Start Date End Date Taking? Authorizing Provider  apixaban (ELIQUIS) 5 MG TABS tablet Take 1 tablet (5 mg total) by mouth 2 (two) times daily. 07/18/21 08/17/21 Yes Jozelynn Danielson, MD  calcium carbonate (OS-CAL - DOSED IN MG OF ELEMENTAL CALCIUM) 1250 MG tablet Take 1 tablet by mouth daily with breakfast.    [provider]  cholecalciferol (VITAMIN D) 1000 UNITS tablet Take 1,000 Units by mouth daily.    [provider]  furosemide (LASIX) 20 MG tablet Take 1 tablet (20 mg total) by mouth daily. 07/18/21   Varney Biles, MD  Multiple Vitamin (MULTIVITAMIN) capsule Take 1 capsule by mouth daily.    [provider]  vitamin A 10000 UNIT capsule  Take 10,000 Units by mouth daily.    [provider]  vitamin B-12 (CYANOCOBALAMIN) 1000 MCG tablet Take 1,000 mcg by mouth daily.    [provider]  vitamin C (ASCORBIC ACID) 500 MG tablet Take 1,000 mg by mouth daily.    [provider]  vitamin E 400 UNIT capsule Take 400 Units by mouth daily.    [provider]    Allergies    Patient has no known allergies.  Review of Systems   Review of Systems  Constitutional:  Positive for activity change.  Respiratory:  Positive for shortness of breath.   Cardiovascular:  Positive for chest pain.  Neurological:  Positive for dizziness.  All other systems reviewed and are negative.  Physical Exam Updated Vital Signs BP 139/64   Pulse 72   Temp 98 F (36.7 C)   Resp 20   Ht 5' 6.5" (1.689 m)   Wt 118.8 kg   LMP 11/05/1995   SpO2 97%   BMI 41.65 kg/m   Physical Exam  ED Results / Procedures / Treatments   Labs (all labs ordered are listed, but only abnormal results are displayed) Labs Reviewed  CBC WITH DIFFERENTIAL/PLATELET - Abnormal; Notable for the following components:      Result Value   RBC 3.56 (*)    Hemoglobin 11.0 (*)    HCT 34.8 (*)    All other components within normal limits  COMPREHENSIVE METABOLIC PANEL - Abnormal; Notable for the following components:   Glucose, Bld 102 (*)    Calcium 8.8 (*)    All other components within normal limits  URINALYSIS, ROUTINE W REFLEX MICROSCOPIC - Abnormal; Notable for the following components:   Protein, ur 30 (*)    All other components within normal limits  BRAIN NATRIURETIC PEPTIDE - Abnormal; Notable for the following components:   B Natriuretic Peptide 304.5 (*)    All other components within normal limits  URINALYSIS, MICROSCOPIC (REFLEX) - Abnormal; Notable for the following components:   Bacteria, UA RARE (*)    All other components within normal limits  D-DIMER, QUANTITATIVE - Abnormal; Notable for the following components:    D-Dimer, Quant 2.34 (*)    All other components within normal limits  TROPONIN I (HIGH SENSITIVITY) -  Abnormal; Notable for the following components:   Troponin I (High Sensitivity) 25 (*)    All other components within normal limits  TROPONIN I (HIGH SENSITIVITY) - Abnormal; Notable for the following components:   Troponin I (High Sensitivity) 26 (*)    All other components within normal limits  RESP PANEL BY RT-PCR (FLU A&B, COVID) ARPGX2  TSH    EKG EKG Interpretation  Date/Time:  Wednesday July 18 2021 01:38:03 EDT Ventricular Rate:  70 PR Interval:    QRS Duration: 98 QT Interval:  416 QTC Calculation: 449 R Axis:   239 Text Interpretation: Atrial fibrillation Markedly posterior QRS axis Low voltage, precordial leads No significant change since last tracing afib is new today Confirmed by Varney Biles [phone]) on 07/18/2021 1:52:00 AM ED ECG REPORT   Date: 07/18/2021  Rate: 68  Rhythm: atrial fibrillation  QRS Axis: normal  Intervals: normal  ST/T Wave abnormalities: nonspecific ST/T changes  Conduction Disutrbances:none  Narrative Interpretation:   Old EKG Reviewed: changes noted  I have personally reviewed the EKG tracing and agree with the computerized printout as noted.    Radiology DG Chest 2 View  Result Date: 07/17/2021 CLINICAL DATA:  Sudden onset mid chest pain, shortness of breath EXAM: CHEST - 2 VIEW COMPARISON:  03/18/2008 FINDINGS: Frontal and lateral views of the chest demonstrate an enlarged cardiac silhouette. Stable ectasia of the thoracic aorta. There is interval elevation of the right hemidiaphragm. Patchy consolidation at the right lung base may reflect airspace disease or atelectasis. No effusion or pneumothorax. No acute bony abnormalities. IMPRESSION: 1. Patchy right lower lobe consolidation which may reflect airspace disease or atelectasis. 2. Elevated right hemidiaphragm, new since 2009. 3. Enlarged cardiac silhouette. Electronically  Signed   By: Randa Ngo M.D.   On: 07/17/2021 20:57   CT Angio Chest PE W and/or Wo Contrast  Result Date: 07/18/2021 CLINICAL DATA:  Shortness of breath. EXAM: CT ANGIOGRAPHY CHEST WITH CONTRAST TECHNIQUE: Multidetector CT imaging of the chest was performed using the standard protocol during bolus administration of intravenous contrast. Multiplanar CT image reconstructions and MIPs were obtained to evaluate the vascular anatomy. CONTRAST:  175m OMNIPAQUE IOHEXOL 350 MG/ML SOLN COMPARISON:  None. FINDINGS: Cardiovascular: There is marked severity calcification of the aortic arch without evidence of aortic aneurysm. The subsegmental pulmonary arteries are limited in evaluation secondary to suboptimal opacification with intravenous contrast and patient motion. No true intraluminal filling defects are identified. Mild to moderate severity cardiomegaly. No pericardial effusion. Mediastinum/Nodes: Mild AP window lymphadenopathy is seen. Thyroid gland, trachea, and esophagus demonstrate no significant findings. Lungs/Pleura: Mild areas of atelectasis and/or infiltrate are seen within the bilateral lower lobes. There is a moderate sized right pleural effusion. No pneumothorax is identified. Upper Abdomen: No acute abnormality. Musculoskeletal: No chest wall abnormality. No acute or significant osseous findings. Review of the MIP images confirms the above findings. IMPRESSION: 1. Mild bilateral lower lobe atelectasis and/or infiltrate. 2. Moderate sized right pleural effusion. 3. Mild to moderate severity cardiomegaly. 4. Limited evaluation of the subsegmental pulmonary arteries without definite evidence of pulmonary embolism. 5. Aortic atherosclerosis. Aortic Atherosclerosis (ICD10-I70.0). Electronically Signed   By: TVirgina NorfolkM.D.   On: 07/18/2021 00:14    Procedures Procedures   Medications Ordered in ED Medications  iohexol (OMNIPAQUE) 350 MG/ML injection 100 mL (100 mLs Intravenous Contrast  Given 07/17/21 2352)    ED Course  I have reviewed the triage vital signs and the nursing notes.  Pertinent labs & imaging results  that were available during my care of the patient were reviewed by me and considered in my medical decision making (see chart for details).    MDM Rules/Calculators/A&P                         {Remember to document critical care time when appropriate:  70 year old female comes in with chief complaint of exertional shortness of breath, dizziness, leg swelling and an episode of chest pain.  Shortness of breath has been ongoing for quite a while.  On exam patient is noted to have pitting edema with mild JVD.  Although she reports wheezing, I do not have any clear wheezing on my exam.  There is some rales on the right lung field.  She has no medical history but has not seen a PCP in 2 years.  Differential diagnoses include CHF, pleural effusion, pulmonary edema, PE, ACS Initial EKG shows A. fib, but on my assessment patient is having regular rhythm.  Repeat EKG ordered.   Reassessment: EKG showing A. fib.  This is a new diagnosis for the patient.  She has remained rate controlled. Ambulatory pulse ox was normal.  Discussed case with Dr. Blossom Hoops, cardiology on-call.  He does not think that we need to initiate rate control for this patient at this time, especially in the setting of possible mild CHF.  Close follow-up is appropriate without rate control at this time.  We will start patient on Eliquis 5 mg twice daily.  Results of the ER work-up discussed with her.  Message sent to Dr. Rayann Heman, cardiology to see if they can see the patient soon.  Strict ER return precautions have been discussed, and patient is agreeing with the plan and is comfortable with the workup done and the recommendations from the ER.   CHA2Ds2-VASc Score for Atrial Fibrillation    Patient Score  Age <65 = 0 65-74 = 1 > 75 = 2 1  Sex Female = 0 Female = 1 1  CHF History No = 0  Yes = 1 1   HTN History No = 0  Yes = 1 0  Stroke/TIA/TE History No = 0  Yes = 1 0  Vascular Disease History No = 0  Yes = 1 0  Diabetes History No = 0  Yes = 1 0  Total:  3   3.2 % stroke rate/year from a score of 3      Final Clinical Impression(s) / ED Diagnoses Final diagnoses:  Pleural effusion  Cardiomegaly  Dyspnea on exertion  Atrial fibrillation, unspecified type ([place])    Rx / DC Orders ED Discharge Orders          Ordered    furosemide (LASIX) 20 MG tablet  Daily,   Status:  Discontinued        07/18/21 0111    furosemide (LASIX) 20 MG tablet  Daily        07/18/21 0113    apixaban (ELIQUIS) 5 MG TABS tablet  2 times daily        07/18/21 0148    Amb referral to [facility]        07/18/21 [place], Sabriya Yono, MD 07/18/21 Irven Coe    Varney Biles, MD 07/18/21 0153    Varney Biles, MD 07/18/21 0206

## 2021-07-18 NOTE — Discharge Instructions (Addendum)
We signed the ER for shortness of breath.  Our work-up here indicates that you most likely have some congestive heart failure and also a condition called A. Fib.  For this reason, we are sending you home with a water pill called furosemide and blood thinner called Eliquis.  Please read the instructions provided on both of those medications and follow-up with the atrial fibrillation clinic for further evaluation.  Please return to the ER if you have worsening chest pain, shortness of breath, pain radiating to your jaw, shoulder, or back, sweats or fainting. Otherwise see the Cardiologist or your primary care doctor as requested.

## 2021-07-24 ENCOUNTER — Encounter (HOSPITAL_COMMUNITY): Payer: Self-pay | Admitting: Physician Assistant

## 2021-07-24 ENCOUNTER — Ambulatory Visit (HOSPITAL_COMMUNITY)
Admission: RE | Admit: 2021-07-24 | Discharge: 2021-07-24 | Disposition: A | Discharge status: Home / Self Care | Payer: Self-pay | Source: Ambulatory Visit | Attending: Physician Assistant | Admitting: Physician Assistant

## 2021-07-24 ENCOUNTER — Other Ambulatory Visit: Payer: Self-pay

## 2021-07-24 VITALS — BP 144/82 | HR 65 | Ht 66.5 in | Wt 261.4 lb

## 2021-07-24 DIAGNOSIS — R400 Somnolence: Secondary | ICD-10-CM | POA: Insufficient documentation

## 2021-07-24 DIAGNOSIS — Z6841 Body Mass Index (BMI) 40.0 and over, adult: Secondary | ICD-10-CM | POA: Insufficient documentation

## 2021-07-24 DIAGNOSIS — D6869 Other thrombophilia: Secondary | ICD-10-CM | POA: Insufficient documentation

## 2021-07-24 DIAGNOSIS — E669 Obesity, unspecified: Secondary | ICD-10-CM | POA: Insufficient documentation

## 2021-07-24 DIAGNOSIS — I4819 Other persistent atrial fibrillation: Secondary | ICD-10-CM | POA: Insufficient documentation

## 2021-07-24 DIAGNOSIS — I509 Heart failure, unspecified: Secondary | ICD-10-CM | POA: Insufficient documentation

## 2021-07-24 DIAGNOSIS — Z7901 Long term (current) use of anticoagulants: Secondary | ICD-10-CM | POA: Insufficient documentation

## 2021-07-24 DIAGNOSIS — Z79899 Other long term (current) drug therapy: Secondary | ICD-10-CM | POA: Insufficient documentation

## 2021-07-24 DIAGNOSIS — Z7182 Exercise counseling: Secondary | ICD-10-CM | POA: Insufficient documentation

## 2021-07-24 DIAGNOSIS — R0683 Snoring: Secondary | ICD-10-CM | POA: Insufficient documentation

## 2021-07-24 DIAGNOSIS — Z8249 Family history of ischemic heart disease and other diseases of the circulatory system: Secondary | ICD-10-CM | POA: Insufficient documentation

## 2021-07-24 NOTE — Progress Notes (Addendum)
Primary Care Physician: No primary care provider on file. Primary Cardiologist: none Primary Electrophysiologist: none Referring Physician: [facility]   Brittney Walker is a 70 y.o. female with a history of tyroid disease s/p RAI, vulvar neoplasia, and atrial fibrillation who presents for consultation in the [facility].  The patient was initially diagnosed with atrial fibrillation 07/18/21 after presenting to the ED with symptoms of worsening SOB with exertion over several weeks. Patient was prescribed Eliquis for a CHADS2VASC score of 3 but she has not started this yet due to cost concerns. She reports that since starting Lasix she feels much better with her SOB and lower extremity edema much improved. She denies significant alcohol use but does report snoring, apneic episodes, and daytime somnolence. Patient is working on changing her insurance coverage.   Today, she denies symptoms of palpitations, chest pain, shortness of breath, orthopnea, PND, lower extremity edema, dizziness, presyncope, syncope, bleeding, or neurologic sequela. The patient is tolerating medications without difficulties and is otherwise without complaint today.    Atrial Fibrillation Risk Factors:  she does have symptoms or diagnosis of sleep apnea. she does not have a history of rheumatic fever. she does not have a history of alcohol use. The patient does have a history of early familial atrial fibrillation or other arrhythmias. Mother has afib, sister has PPM.  she has a BMI of Body mass index is 41.56 kg/m.Marland Kitchen Filed Weights   07/24/21 1334  Weight: 118.6 kg    Family History  Problem Relation Age of Onset   Seizures Father        Epilepsy   Hypertension Father    Heart attack Father        ?   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     cholecalciferol (VITAMIN D) 1000 UNITS tablet Take 1,000 Units by mouth daily.     furosemide (LASIX) 20 MG tablet Take 1 tablet (20 mg total) by mouth daily. 15 tablet 0   Multiple Vitamin (MULTIVITAMIN) capsule Take 1 capsule by mouth daily.     vitamin A 10000 UNIT capsule Take 10,000 Units by mouth daily.     vitamin B-12 (CYANOCOBALAMIN) 1000 MCG tablet Take 1,000 mcg by mouth daily.     vitamin C (ASCORBIC ACID) 500 MG tablet Take 1,000  mg by mouth daily.     vitamin E 400 UNIT capsule Take 400 Units by mouth daily.     apixaban (ELIQUIS) 5 MG TABS tablet Take 1 tablet (5 mg total) by mouth 2 (two) times daily. (Patient not taking: Reported on 07/24/2021) 60 tablet 0   No current facility-administered medications for this encounter.    No Known Allergies  Social History   Socioeconomic History   Marital status: Single    Spouse name: Not on file   Number of children: 0   Years of education: Not on file   Highest education level: Not on file  Occupational History   Occupation: collections   Tobacco Use   Smoking status: Never   Smokeless  tobacco: Never  Vaping Use   Vaping Use: Never used  Substance and Sexual Activity   Alcohol use: No    Alcohol/week: 0.0 standard drinks   Drug use: No   Sexual activity: Never    Comment: Virgin  Other Topics Concern   Not on file  Social History Narrative   Lives by herself    Social Determinants of Health   Financial Resource Strain: Not on file  Food Insecurity: Not on file  Transportation Needs: Not on file  Physical Activity: Not on file  Stress: Not on file  Social Connections: Not on file  Intimate Partner Violence: Not on file     ROS- All systems are reviewed and negative except as per the HPI above.  Physical Exam: Vitals:   07/24/21 1334  BP: (!) 144/82  Pulse: 65  Weight: 118.6 kg  Height: 5' 6.5" (1.689 m)    GEN- The patient is a well appearing obese female, alert and oriented x 3 today.   Head- normocephalic, atraumatic Eyes-  Sclera clear, conjunctiva pink Ears- hearing intact Oropharynx- clear Neck- supple  Lungs- Clear to ausculation bilaterally, normal work of breathing Heart- irregular rate and rhythm, no murmurs, rubs or gallops  GI- soft, NT, ND, + BS Extremities- no clubbing, cyanosis. Trace bilateral lower extremity edema.  MS- no significant deformity or atrophy Skin- no rash or lesion Psych- euthymic mood, full affect Neuro- strength and sensation are intact  Wt Readings from Last 3 Encounters:  07/24/21 118.6 kg  07/17/21 118.8 kg  12/07/18 109.3 kg    EKG today demonstrates  Afib, slow R wave progression, low voltage Vent. rate 65 BPM PR interval * ms QRS duration 88 ms QT/QTcB 428/445 ms  Epic records are reviewed at length today  CHA2DS2-VASc Score = 3  The patient's score is based upon: CHF History: 0 HTN History: 0 Diabetes History: 0 Stroke History: 0 Vascular Disease History: 1 (aortic atherosclerosis) Age Score: 1 Gender Score: 1      ASSESSMENT AND PLAN: 1. Persistent atrial fibrillation  The  patient's CHA2DS2-VASc score is 3, indicating a 3.2% annual risk of stroke.   General education about afib provided and questions answered. We also discussed her stroke risk and the risks and benefits of anticoagulation. Patient in rate controlled afib. She is not really aware of her arrhythmia. Start Eliquis 5 mg BID, 30 day free card, samples, and patient assistance information given today.  Will check echocardiogram once she has insurance.   2. Secondary Hypercoagulable State (ICD10:  D68.69) The patient is at significant risk for stroke/thromboembolism based upon her CHA2DS2-VASc Score of 3.  Start Apixaban (Eliquis).   3. Obesity Body mass index is 41.56 kg/m. Lifestyle modification was discussed at length including  regular exercise and weight reduction.  4. Snoring/witnessed apnea/daytime somnolence  The importance of adequate treatment of sleep apnea was discussed today in order to improve our ability to maintain sinus rhythm long term. Will arrange for sleep study once she has insurance coverage.   5. CHF BNP 304 at ED Feeling much better after diuresis.  Continue Lasix 20 mg daily Will need echo as above.   Follow up in the AF clinic in 3-4 weeks.   [facility] [address] [place], [place] [address] [phone] 07/24/2021 4:00 PM

## 2021-07-25 ENCOUNTER — Other Ambulatory Visit (HOSPITAL_COMMUNITY): Payer: Self-pay

## 2021-07-25 MED ORDER — APIXABAN 5 MG PO TABS: 5.0000 mg | ORAL_TABLET | Freq: Two times a day (BID) | ORAL | 0 refills | Status: DC

## 2021-07-26 ENCOUNTER — Telehealth (HOSPITAL_COMMUNITY): Payer: Self-pay | Admitting: *Deleted

## 2021-07-26 NOTE — Telephone Encounter (Signed)
Pt approved for Eliquis patient assistance through 07/26/21-11/03/21 app # [id number]

## 2021-07-30 ENCOUNTER — Other Ambulatory Visit (HOSPITAL_COMMUNITY): Payer: Self-pay

## 2021-07-30 MED ORDER — APIXABAN 5 MG PO TABS: 5.0000 mg | ORAL_TABLET | Freq: Two times a day (BID) | ORAL | 3 refills | Status: DC

## 2021-07-31 ENCOUNTER — Other Ambulatory Visit (HOSPITAL_COMMUNITY): Payer: Self-pay | Admitting: *Deleted

## 2021-07-31 MED ORDER — FUROSEMIDE 20 MG PO TABS: 20.0000 mg | ORAL_TABLET | Freq: Every day | ORAL | 3 refills | Status: DC

## 2021-08-15 ENCOUNTER — Ambulatory Visit (HOSPITAL_COMMUNITY): Payer: Self-pay | Admitting: Physician Assistant

## 2021-08-21 ENCOUNTER — Inpatient Hospital Stay (HOSPITAL_COMMUNITY): Admission: RE | Admit: 2021-08-21 | Payer: Self-pay | Source: Ambulatory Visit | Admitting: Physician Assistant

## 2021-09-03 ENCOUNTER — Emergency Department (HOSPITAL_COMMUNITY)
Admission: EM | Admit: 2021-09-03 | Discharge: 2021-09-04 | Disposition: A | Discharge status: Home / Self Care | Payer: Medicare Other | Attending: Emergency Medicine | Admitting: Emergency Medicine

## 2021-09-03 ENCOUNTER — Emergency Department (HOSPITAL_COMMUNITY): Payer: Medicare Other

## 2021-09-03 ENCOUNTER — Encounter (HOSPITAL_COMMUNITY): Payer: Self-pay

## 2021-09-03 ENCOUNTER — Other Ambulatory Visit: Payer: Self-pay

## 2021-09-03 DIAGNOSIS — I4819 Other persistent atrial fibrillation: Secondary | ICD-10-CM | POA: Diagnosis not present

## 2021-09-03 DIAGNOSIS — Z20822 Contact with and (suspected) exposure to covid-19: Secondary | ICD-10-CM | POA: Diagnosis not present

## 2021-09-03 DIAGNOSIS — R609 Edema, unspecified: Secondary | ICD-10-CM

## 2021-09-03 DIAGNOSIS — I509 Heart failure, unspecified: Secondary | ICD-10-CM | POA: Insufficient documentation

## 2021-09-03 DIAGNOSIS — E039 Hypothyroidism, unspecified: Secondary | ICD-10-CM | POA: Insufficient documentation

## 2021-09-03 DIAGNOSIS — Z7901 Long term (current) use of anticoagulants: Secondary | ICD-10-CM | POA: Insufficient documentation

## 2021-09-03 DIAGNOSIS — L03115 Cellulitis of right lower limb: Secondary | ICD-10-CM | POA: Diagnosis not present

## 2021-09-03 DIAGNOSIS — R0602 Shortness of breath: Secondary | ICD-10-CM | POA: Diagnosis present

## 2021-09-03 HISTORY — DX: Unspecified atrial fibrillation: I48.91

## 2021-09-03 LAB — BASIC METABOLIC PANEL WITH GFR
Anion gap: 6 (ref 5–15)
BUN: 10 mg/dL (ref 8–23)
CO2: 28 mmol/L (ref 22–32)
Calcium: 8.9 mg/dL (ref 8.9–10.3)
Chloride: 104 mmol/L (ref 98–111)
Creatinine, Ser: 0.83 mg/dL (ref 0.44–1.00)
GFR, Estimated: >60 mL/min
Glucose, Bld: 118 mg/dL — ABNORMAL HIGH (ref 70–99)
Potassium: 3.3 mmol/L — ABNORMAL LOW (ref 3.5–5.1)
Sodium: 138 mmol/L (ref 135–145)

## 2021-09-03 LAB — CBC
HCT: 33.5 % — ABNORMAL LOW (ref 36.0–46.0)
Hemoglobin: 10.7 g/dL — ABNORMAL LOW (ref 12.0–15.0)
MCH: 30.5 pg (ref 26.0–34.0)
MCHC: 31.9 g/dL (ref 30.0–36.0)
MCV: 95.4 fL (ref 80.0–100.0)
Platelets: 149 K/uL — ABNORMAL LOW (ref 150–400)
RBC: 3.51 MIL/uL — ABNORMAL LOW (ref 3.87–5.11)
RDW: 14.8 % (ref 11.5–15.5)
WBC: 5.8 K/uL (ref 4.0–10.5)
nRBC: 0 % (ref 0.0–0.2)

## 2021-09-03 LAB — BRAIN NATRIURETIC PEPTIDE: B Natriuretic Peptide: 290.3 pg/mL — ABNORMAL HIGH (ref 0.0–100.0)

## 2021-09-03 LAB — TROPONIN I (HIGH SENSITIVITY): Troponin I (High Sensitivity): 24 ng/L — ABNORMAL HIGH (ref <18)

## 2021-09-03 NOTE — ED Triage Notes (Signed)
Pt reports SOB "off and on for a while" and states she has been seen at the AFIB clinic and was told she needs a "water pill". Denies chest pain. She reports b/l leg swelling as well as a red rash to her shins but states she thinks that is her gout flare up.

## 2021-09-04 DIAGNOSIS — I509 Heart failure, unspecified: Secondary | ICD-10-CM | POA: Diagnosis not present

## 2021-09-04 LAB — MAGNESIUM: Magnesium: 2 mg/dL (ref 1.7–2.4)

## 2021-09-04 LAB — TROPONIN I (HIGH SENSITIVITY)
Troponin I (High Sensitivity): 25 ng/L — ABNORMAL HIGH (ref <18)
Troponin I (High Sensitivity): 23 ng/L — ABNORMAL HIGH (ref <18)

## 2021-09-04 LAB — RESP PANEL BY RT-PCR (FLU A&B, COVID) ARPGX2
Influenza A by PCR: NEGATIVE
Influenza B by PCR: NEGATIVE
SARS Coronavirus 2 by RT PCR: NEGATIVE

## 2021-09-04 LAB — TSH: TSH: 2.907 u[IU]/mL (ref 0.350–4.500)

## 2021-09-04 MED ORDER — FUROSEMIDE 40 MG PO TABS
40.0000 mg | ORAL_TABLET | Freq: Every day | ORAL | 0 refills | Status: DC
Start: 2021-09-04 — End: 2021-09-12

## 2021-09-04 MED ORDER — DOXYCYCLINE HYCLATE 100 MG PO TABS
100.0000 mg | ORAL_TABLET | Freq: Once | ORAL | Status: AC
  Administered 2021-09-04: 100 mg via ORAL
  Filled 2021-09-04: qty 1

## 2021-09-04 MED ORDER — FUROSEMIDE 10 MG/ML IJ SOLN
40.0000 mg | Freq: Once | INTRAMUSCULAR | Status: AC
  Administered 2021-09-04: 40 mg via INTRAVENOUS
  Filled 2021-09-04: qty 4

## 2021-09-04 MED ORDER — MUPIROCIN CALCIUM 2 % EX CREA
TOPICAL_CREAM | Freq: Once | CUTANEOUS | Status: AC
  Administered 2021-09-04: 1 via TOPICAL
  Filled 2021-09-04: qty 15

## 2021-09-04 MED ORDER — CEPHALEXIN 500 MG PO CAPS: 500.0000 mg | ORAL_CAPSULE | Freq: Four times a day (QID) | ORAL | 0 refills | Status: DC

## 2021-09-04 MED ORDER — POTASSIUM CHLORIDE CRYS ER 20 MEQ PO TBCR
40.0000 meq | EXTENDED_RELEASE_TABLET | Freq: Once | ORAL | Status: AC
  Administered 2021-09-04: 40 meq via ORAL
  Filled 2021-09-04: qty 2

## 2021-09-04 NOTE — ED Notes (Signed)
Pt in bed, family at bedside, pt c/o sob, pt has plus two edema in lower extremities, pt has diminished lung sounds, pt talking in full sentences.

## 2021-09-04 NOTE — ED Notes (Signed)
While ambulating pt with pulse ox. Spo2 stayed between 98%-100%.

## 2021-09-04 NOTE — ED Notes (Signed)
[employer] charge voucher in Paula's office with pt sticker.  

## 2021-09-04 NOTE — ED Notes (Signed)
Gave pt something to eat and drink

## 2021-09-04 NOTE — ED Provider Notes (Signed)
[facility] EMERGENCY DEPARTMENT Provider Note   CSN: [id number] Arrival date & time: 09/03/21  2027     History Chief Complaint  Patient presents with   Shortness of Breath    Brittney Walker is a 70 y.o. female.  Pt presents to the ED today with SOB.  She actually came to the ED last night and unfortunately waited for over 18 hours to be seen.  Pt has a hx of afib and was put on lasix 20 mg last month.  She was also just put on Eliquis last month.  Pt said she's been compliant with her meds.  She did see the afib clinic on 9/20.  It looks like an ECHO has been ordered, but I don't see that it's been done yet.  Pt said she has been feeling sob for the last few weeks.  She feels that her legs and her abdomen are more swollen.  Sob is worse with exertion. She has sores to the front of her legs with some drainage from her right leg.  Pt denies any cp.  No f/c.      Past Medical History:  Diagnosis Date   Atrial fibrillation ([place])    Carotid bruit    w/ neg dopplers    Thyroid disease     s/p RAI 2004   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   Vitamin D deficiency     Patient Active Problem List   Diagnosis Date Noted   Persistent atrial fibrillation ([place]) 07/24/2021   Secondary hypercoagulable state ([place]) 07/24/2021   Morbid obesity ([place]) 12/10/2017   PCP NTES >>>>>>>>>>>>>>>>>>>>>>>> 12/04/2016   Pain in joint, ankle and foot 12/21/2014   Annual physical exam 10/14/2014   Vitamin D deficiency 02/14/2012   GOITER, MULTINODULAR 08/24/2009   Hypothyroidism 08/24/2009   HEMANGIOMA, SKIN 06/17/2007   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   Breast cancer Sister 16   Heart disease Mother    Hypertension Mother    Colon cancer Neg Hx    Pancreatic cancer Neg Hx    Esophageal cancer Neg Hx    Prostate cancer Neg Hx    Rectal cancer Neg Hx     Social History   Tobacco Use   Smoking status: Never   Smokeless tobacco: Never  Vaping Use   Vaping Use: Never used  Substance Use Topics   Alcohol use: No    Alcohol/week: 0.0 standard  drinks   Drug use: No    Home Medications Prior to Admission medications   Medication Sig Start Date End Date Taking? Authorizing Provider  cephALEXin (KEFLEX) 500 MG capsule Take 1 capsule (500 mg total) by mouth 4 (four) times daily. 09/04/21  Yes Isla Pence, MD  apixaban (ELIQUIS) 5 MG TABS tablet Take 1 tablet (5 mg total) by mouth 2 (two) times daily. 07/30/21   Fenton, Clint R, PA  calcium carbonate (OS-CAL - DOSED IN MG OF ELEMENTAL CALCIUM) 1250 MG tablet Take 1 tablet by mouth daily with breakfast.    [provider]  cholecalciferol (VITAMIN D) 1000 UNITS tablet Take 1,000 Units by mouth daily.    [provider]  furosemide (LASIX) 40 MG tablet Take 1 tablet (40 mg total) by mouth daily. 09/04/21   Isla Pence, MD  Multiple Vitamin (MULTIVITAMIN) capsule Take 1 capsule by mouth daily.    [provider]  vitamin A 10000 UNIT capsule Take 10,000 Units by mouth daily.    [provider]  vitamin B-12 (CYANOCOBALAMIN) 1000 MCG tablet Take 1,000 mcg by mouth daily.    [provider]  vitamin C (ASCORBIC ACID) 500 MG tablet Take 1,000 mg by mouth daily.    [provider]  vitamin E 400 UNIT capsule Take 400 Units by mouth daily.    [provider]     Allergies    Patient has no known allergies.  Review of Systems   Review of Systems  Respiratory:  Positive for shortness of breath.   Cardiovascular:  Positive for leg swelling.  All other systems reviewed and are negative.  Physical Exam Updated Vital Signs BP 124/83   Pulse (!) 59   Temp 98.5 F (36.9 C) (Oral)   Resp (!) 22   Ht 5\' 6"  (1.676 m)   Wt 119.3 kg   LMP 11/05/1995   SpO2 100%   BMI 42.45 kg/m   Physical Exam Vitals and nursing note reviewed.  Constitutional:      Appearance: She is well-developed. She is obese.  HENT:     Head: Normocephalic and atraumatic.     Mouth/Throat:     Mouth: Mucous membranes are moist.     Pharynx: Oropharynx is clear.  Eyes:     Extraocular Movements: Extraocular movements intact.     Pupils: Pupils are equal, round, and reactive to light.  Cardiovascular:     Rate and Rhythm: Normal rate. Rhythm irregular.  Pulmonary:     Effort: Pulmonary effort is normal.     Breath sounds: Rhonchi present.  Musculoskeletal:     Cervical back: Normal range of motion and neck supple.     Right lower leg: Edema present.     Left lower leg: Edema present.  Skin:    General: Skin is warm.     Capillary Refill: Capillary refill takes less than 2 seconds.     Comments: Draining wound to right anterior shin.  Left shin with a tiny sore without any drainage.  Neurological:     General: No focal deficit present.     Mental Status: She is alert and oriented to person, place, and time.  Psychiatric:        Mood and Affect: Mood normal.        Behavior: Behavior normal.    ED Results / Procedures / Treatments   Labs (all labs ordered are listed, but only abnormal results are displayed) Labs Reviewed  BASIC METABOLIC PANEL -  Abnormal; Notable for the following components:      Result Value   Potassium 3.3 (*)    Glucose, Bld 118 (*)    All other components within normal limits  CBC - Abnormal; Notable for the following  components:   RBC 3.51 (*)    Hemoglobin 10.7 (*)    HCT 33.5 (*)    Platelets 149 (*)    All other components within normal limits  BRAIN NATRIURETIC PEPTIDE - Abnormal; Notable for the following components:   B Natriuretic Peptide 290.3 (*)    All other components within normal limits  TROPONIN I (HIGH SENSITIVITY) - Abnormal; Notable for the following components:   Troponin I (High Sensitivity) 24 (*)    All other components within normal limits  TROPONIN I (HIGH SENSITIVITY) - Abnormal; Notable for the following components:   Troponin I (High Sensitivity) 25 (*)    All other components within normal limits  TROPONIN I (HIGH SENSITIVITY) - Abnormal; Notable for the following components:   Troponin I (High Sensitivity) 23 (*)    All other components within normal limits  RESP PANEL BY RT-PCR (FLU A&B, COVID) ARPGX2  MAGNESIUM  TSH    EKG EKG Interpretation  Date/Time:  Monday September 03 2021 22:28:10 EDT Ventricular Rate:  61 PR Interval:    QRS Duration: 86 QT Interval:  392 QTC Calculation: 394 R Axis:   181 Text Interpretation: Atrial fibrillation Low voltage QRS Possible Anterolateral infarct , age undetermined Abnormal ECG Confirmed by Pattricia Boss [phone]) on 09/04/2021 1:54:18 PM  Radiology DG Chest 2 View  Result Date: 09/03/2021 CLINICAL DATA:  Chest pain. EXAM: CHEST - 2 VIEW COMPARISON:  Chest radiograph dated 07/17/2021. FINDINGS: Shallow inspiration. There is mild eventration of the right hemidiaphragm. Bilateral mid to lower lung field opacities may represent atelectasis but concerning for infiltrate. Small bilateral pleural effusions may be present. No pneumothorax. Stable cardiomegaly. Atherosclerotic calcification of the aorta. No acute osseous pathology. IMPRESSION: Bilateral mid to lower lung field opacities may represent atelectasis but concerning for infiltrate. Electronically Signed   By: Anner Crete M.D.   On: 09/03/2021 23:20     Procedures Procedures   Medications Ordered in ED Medications  mupirocin cream (BACTROBAN) 2 % (has no administration in time range)  furosemide (LASIX) injection 40 mg (40 mg Intravenous Given 09/04/21 1537)  doxycycline (VIBRA-TABS) tablet 100 mg (100 mg Oral Given 09/04/21 1535)  potassium chloride SA (KLOR-CON) CR tablet 40 mEq (40 mEq Oral Given 09/04/21 1534)    ED Course  I have reviewed the triage vital signs and the nursing notes.  Pertinent labs & imaging results that were available during my care of the patient were reviewed by me and considered in my medical decision making (see chart for details).    MDM Rules/Calculators/A&P                           Troponin has remained flat.  She is feeling better after 40 mg lasix iv.  She is able to ambulate with O2 sats 98-100%.  She will be d/c with mupirocin crm and keflex.  I am unsure if the home chf program is still in progress, but I sent a secure chat message to them as she would be a candidate.    Pt is stable for d/c.  Return if worse.  F/u with pcp. Final Clinical Impression(s) / ED Diagnoses Final diagnoses:  Acute on chronic congestive heart  failure, unspecified heart failure type ([place])  Peripheral edema  Cellulitis of right lower extremity    Rx / DC Orders ED Discharge Orders          Ordered    furosemide (LASIX) 40 MG tablet  Daily        09/04/21 1653    cephALEXin (KEFLEX) 500 MG capsule  4 times daily        09/04/21 1654             Isla Pence, MD 09/04/21 1705

## 2021-09-04 NOTE — Discharge Instructions (Addendum)
Increase lasix to 40 mg daily

## 2021-09-12 ENCOUNTER — Encounter (HOSPITAL_COMMUNITY): Payer: Self-pay | Admitting: Physician Assistant

## 2021-09-12 ENCOUNTER — Encounter (HOSPITAL_COMMUNITY): Payer: Self-pay | Admitting: Cardiology

## 2021-09-12 ENCOUNTER — Ambulatory Visit (HOSPITAL_COMMUNITY)
Admission: RE | Admit: 2021-09-12 | Discharge: 2021-09-12 | Disposition: A | Discharge status: Home / Self Care | Payer: Medicaid Other | Source: Ambulatory Visit | Attending: Physician Assistant | Admitting: Physician Assistant

## 2021-09-12 VITALS — BP 122/84 | HR 58 | Ht 66.0 in | Wt 278.2 lb

## 2021-09-12 DIAGNOSIS — I700 Atherosclerosis of aorta: Secondary | ICD-10-CM | POA: Diagnosis not present

## 2021-09-12 DIAGNOSIS — I4819 Other persistent atrial fibrillation: Secondary | ICD-10-CM | POA: Insufficient documentation

## 2021-09-12 DIAGNOSIS — E669 Obesity, unspecified: Secondary | ICD-10-CM | POA: Diagnosis not present

## 2021-09-12 DIAGNOSIS — R0683 Snoring: Secondary | ICD-10-CM | POA: Diagnosis not present

## 2021-09-12 DIAGNOSIS — Z6841 Body Mass Index (BMI) 40.0 and over, adult: Secondary | ICD-10-CM | POA: Diagnosis not present

## 2021-09-12 DIAGNOSIS — D6869 Other thrombophilia: Secondary | ICD-10-CM | POA: Insufficient documentation

## 2021-09-12 DIAGNOSIS — Z79899 Other long term (current) drug therapy: Secondary | ICD-10-CM | POA: Diagnosis not present

## 2021-09-12 DIAGNOSIS — I509 Heart failure, unspecified: Secondary | ICD-10-CM | POA: Insufficient documentation

## 2021-09-12 DIAGNOSIS — R400 Somnolence: Secondary | ICD-10-CM | POA: Insufficient documentation

## 2021-09-12 DIAGNOSIS — Z7901 Long term (current) use of anticoagulants: Secondary | ICD-10-CM | POA: Insufficient documentation

## 2021-09-12 LAB — BASIC METABOLIC PANEL
Anion gap: 7 (ref 5–15)
BUN: 9 mg/dL (ref 8–23)
CO2: 29 mmol/L (ref 22–32)
Calcium: 8.9 mg/dL (ref 8.9–10.3)
Chloride: 96 mmol/L — ABNORMAL LOW (ref 98–111)
Creatinine, Ser: 0.8 mg/dL (ref 0.44–1.00)
GFR, Estimated: >60 mL/min (ref >60)
Glucose, Bld: 106 mg/dL — ABNORMAL HIGH (ref 70–99)
Potassium: 4.3 mmol/L (ref 3.5–5.1)
Sodium: 132 mmol/L — ABNORMAL LOW (ref 135–145)

## 2021-09-12 LAB — CBC
HCT: 33.1 % — ABNORMAL LOW (ref 36.0–46.0)
Hemoglobin: 10.3 g/dL — ABNORMAL LOW (ref 12.0–15.0)
MCH: 30.4 pg (ref 26.0–34.0)
MCHC: 31.1 g/dL (ref 30.0–36.0)
MCV: 97.6 fL (ref 80.0–100.0)
Platelets: 176 10*3/uL (ref 150–400)
RBC: 3.39 MIL/uL — ABNORMAL LOW (ref 3.87–5.11)
RDW: 15.3 % (ref 11.5–15.5)
WBC: 5.4 10*3/uL (ref 4.0–10.5)
nRBC: 0 % (ref 0.0–0.2)

## 2021-09-12 MED ORDER — FUROSEMIDE 40 MG PO TABS: 40.0000 mg | ORAL_TABLET | Freq: Two times a day (BID) | ORAL | 1 refills | Status: DC

## 2021-09-12 MED ORDER — POTASSIUM CHLORIDE CRYS ER 20 MEQ PO TBCR: 20.0000 meq | EXTENDED_RELEASE_TABLET | Freq: Every day | ORAL | 3 refills | Status: DC

## 2021-09-12 NOTE — H&P (View-Only) (Signed)
Primary Care Physician: Colon Branch, MD Primary Cardiologist: none Primary Electrophysiologist: none Referring Physician: [facility]   Brittney Walker is a 70 y.o. female with a history of thyroid disease s/p RAI, vulvar neoplasia, and atrial fibrillation who presents for follow up in the [facility].  The patient was initially diagnosed with atrial fibrillation 07/18/21 after presenting to the ED with symptoms of worsening SOB with exertion over several weeks. Patient was prescribed Eliquis for a CHADS2VASC score of 3.   On follow up today, patient presented again to the ED 09/04/21 with SOB and was given IV lasix and her daily dose was increased. She is still in rate controlled afib. She continues to have SOB, lower extremity edema, and abdominal bloating. She denies any bleeding issues on anticoagulation.   Today, she denies symptoms of palpitations, chest pain, PND, dizziness, presyncope, syncope, bleeding, or neurologic sequela. The patient is tolerating medications without difficulties and is otherwise without complaint today.    Atrial Fibrillation Risk Factors:  she does have symptoms or diagnosis of sleep apnea. she does not have a history of rheumatic fever. she does not have a history of alcohol use. The patient does have a history of early familial atrial fibrillation or other arrhythmias. Mother has afib, sister has PPM.  she has a BMI of Body mass index is 44.9 kg/m.Marland Kitchen Filed Weights   09/12/21 1416  Weight: 126.2 kg     Family History  Problem Relation Age of Onset   Seizures Father        Epilepsy   Hypertension Father    Heart attack Father        ?   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 180 tablet 3   calcium carbonate (OS-CAL - DOSED IN MG OF ELEMENTAL CALCIUM) 1250 MG tablet Take 1 tablet by mouth daily with breakfast.     cephALEXin (KEFLEX) 500 MG capsule Take 1 capsule (500 mg total) by mouth 4 (four) times daily. 28 capsule 0   cholecalciferol (VITAMIN D) 1000 UNITS tablet Take 1,000 Units by mouth daily.     Multiple Vitamin (MULTIVITAMIN) capsule Take 1 capsule by mouth daily.     potassium chloride SA (KLOR-CON) 20 MEQ tablet Take 1 tablet (20 mEq total) by mouth daily.  30 tablet 3   vitamin A 10000 UNIT capsule Take 10,000 Units by mouth daily.     vitamin B-12 (CYANOCOBALAMIN) 1000 MCG tablet Take 1,000 mcg by mouth daily.     vitamin C (ASCORBIC ACID) 500 MG tablet Take 1,000 mg by mouth daily.     vitamin E 400 UNIT capsule Take 400 Units by mouth daily.     furosemide (LASIX) 40 MG tablet Take 1 tablet (40 mg total) by mouth 2 (two) times daily. 60 tablet 1   No current facility-administered medications for this encounter.    No Known Allergies  Social History   Socioeconomic History   Marital status: Single    Spouse name: Not on file   Number of children: 0   Years of education: Not on file   Highest education  level: Not on file  Occupational History   Occupation: collections   Tobacco Use   Smoking status: Never   Smokeless tobacco: Never  Vaping Use   Vaping Use: Never used  Substance and Sexual Activity   Alcohol use: No    Alcohol/week: 0.0 standard drinks   Drug use: No   Sexual activity: Never    Comment: Virgin  Other Topics Concern   Not on file  Social History Narrative   Lives by herself    Social Determinants of Health   Financial Resource Strain: Not on file  Food Insecurity: Not on file  Transportation Needs: Not on file  Physical Activity: Not on file  Stress: Not on file  Social Connections: Not on file  Intimate Partner Violence: Not on file     ROS- All systems are reviewed and negative except as per the HPI above.  Physical Exam: Vitals:   09/12/21 1416  BP: 122/84  Pulse: (!) 58  Weight: 126.2 kg  Height: 5\' 6"  (1.676 m)    GEN- The patient is a well appearing obese female, alert and oriented x 3 today.   HEENT-head normocephalic, atraumatic, sclera clear, conjunctiva pink, hearing intact, trachea midline. Lungs- Clear to ausculation bilaterally, normal work of breathing Heart- irregular rate and rhythm, bradycardia, no murmurs, rubs or gallops  GI- soft, NT, ND, + BS Extremities- no clubbing, cyanosis, or edema MS- no significant deformity or atrophy Skin- no rash or lesion Psych- euthymic mood, full affect Neuro- strength and sensation are intact   Wt Readings from Last 3 Encounters:  09/12/21 126.2 kg  09/03/21 119.3 kg  07/24/21 118.6 kg    EKG today demonstrates  Afib, low voltage Vent. rate 58 BPM PR interval * ms QRS duration 86 ms QT/QTcB 422/414 ms  Epic records are reviewed at length today  CHA2DS2-VASc Score = 4  The patient's score is based upon: CHF History: 1 HTN History: 0 Diabetes History: 0 Stroke History: 0 Vascular Disease History: 1 (aortic atherosclerosis) Age Score: 1 Gender Score: 1        ASSESSMENT  AND PLAN: 1. Persistent atrial fibrillation  The patient's CHA2DS2-VASc score is 4, indicating a 4.8% annual risk of stroke.   Patient remains in afib. Suspect this is contributing to her fluid retention despite good rate control. Continue Eliquis 5 mg BID Will check echocardiogram Will arrange for DCCV. Check bmet/cbc. Will refer her to CSW to discuss insurance issues/coverage options.  2. Secondary Hypercoagulable State (ICD10:  D68.69) The patient is at significant risk for stroke/thromboembolism based upon her CHA2DS2-VASc Score of 3.  Continue Apixaban (Eliquis).   3. Obesity Body mass index is  44.9 kg/m. Lifestyle modification was discussed and encouraged including regular physical activity and weight reduction.  4. Snoring/witnessed apnea/daytime somnolence  Will refer for sleep study once she has insurance coverage.   5. Acute CHF Check echo as above. Her weight is up ~ 16 lbs with symptoms of lower extremity edema, abdominal bloating, and SOB NYHA class III. Will increase Lasix to 40 mg BID. Start KCL 20 meq daily. Check bmet She may be a good candidate for Alleviate-HF.  6. Aortic atherosclerosis Marked severity calcification of aortic arch. Will refer to establish care with primary cardiologist.    Follow up in the AF clinic post DCCV.    [facility] [address] [place], [place] [address] [phone] 09/12/2021 3:18 PM

## 2021-09-12 NOTE — Progress Notes (Signed)
Attempted to obtain medical history via telephone, unable to reach at this time, unable to leave voicemail to return pre surgical testing call.

## 2021-09-12 NOTE — Patient Instructions (Signed)
Start lasix 40mg  twice a day  Start Potassium 70meq once a day  Cardioversion scheduled for Friday, November 18th  - Arrive at the [facility] and go to admitting at 1130AM  - Do not eat or drink anything after midnight the night prior to your procedure.  - Take all your morning medication (except diabetic medications) with a sip of water prior to arrival.  - You will not be able to drive home after your procedure.  - Do NOT miss any doses of your blood thinner - if you should miss a dose please notify our office immediately.  - If you feel as if you go back into normal rhythm prior to scheduled cardioversion, please notify our office immediately. If your procedure is canceled in the cardioversion suite you will be charged a cancellation fee.  Patients will be asked to: to mask in public and hand hygiene (no longer quarantine) in the 3 days prior to surgery, to report if any COVID-19-like illness or household contacts to COVID-19 to determine need for testing

## 2021-09-12 NOTE — Progress Notes (Signed)
Primary Care Physician: Colon Branch, MD Primary Cardiologist: none Primary Electrophysiologist: none Referring Physician: [facility]   Brittney Walker is a 70 y.o. female with a history of thyroid disease s/p RAI, vulvar neoplasia, and atrial fibrillation who presents for follow up in the [facility].  The patient was initially diagnosed with atrial fibrillation 07/18/21 after presenting to the ED with symptoms of worsening SOB with exertion over several weeks. Patient was prescribed Eliquis for a CHADS2VASC score of 3.   On follow up today, patient presented again to the ED 09/04/21 with SOB and was given IV lasix and her daily dose was increased. She is still in rate controlled afib. She continues to have SOB, lower extremity edema, and abdominal bloating. She denies any bleeding issues on anticoagulation.   Today, she denies symptoms of palpitations, chest pain, PND, dizziness, presyncope, syncope, bleeding, or neurologic sequela. The patient is tolerating medications without difficulties and is otherwise without complaint today.    Atrial Fibrillation Risk Factors:  she does have symptoms or diagnosis of sleep apnea. she does not have a history of rheumatic fever. she does not have a history of alcohol use. The patient does have a history of early familial atrial fibrillation or other arrhythmias. Mother has afib, sister has PPM.  she has a BMI of Body mass index is 44.9 kg/m.Marland Kitchen Filed Weights   09/12/21 1416  Weight: 126.2 kg     Family History  Problem Relation Age of Onset   Seizures Father        Epilepsy   Hypertension Father    Heart attack Father        ?   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 180 tablet 3   calcium carbonate (OS-CAL - DOSED IN MG OF ELEMENTAL CALCIUM) 1250 MG tablet Take 1 tablet by mouth daily with breakfast.     cephALEXin (KEFLEX) 500 MG capsule Take 1 capsule (500 mg total) by mouth 4 (four) times daily. 28 capsule 0   cholecalciferol (VITAMIN D) 1000 UNITS tablet Take 1,000 Units by mouth daily.     Multiple Vitamin (MULTIVITAMIN) capsule Take 1 capsule by mouth daily.     potassium chloride SA (KLOR-CON) 20 MEQ tablet Take 1 tablet (20 mEq total) by mouth daily.  30 tablet 3   vitamin A 10000 UNIT capsule Take 10,000 Units by mouth daily.     vitamin B-12 (CYANOCOBALAMIN) 1000 MCG tablet Take 1,000 mcg by mouth daily.     vitamin C (ASCORBIC ACID) 500 MG tablet Take 1,000 mg by mouth daily.     vitamin E 400 UNIT capsule Take 400 Units by mouth daily.     furosemide (LASIX) 40 MG tablet Take 1 tablet (40 mg total) by mouth 2 (two) times daily. 60 tablet 1   No current facility-administered medications for this encounter.    No Known Allergies  Social History   Socioeconomic History   Marital status: Single    Spouse name: Not on file   Number of children: 0   Years of education: Not on file   Highest education  level: Not on file  Occupational History   Occupation: collections   Tobacco Use   Smoking status: Never   Smokeless tobacco: Never  Vaping Use   Vaping Use: Never used  Substance and Sexual Activity   Alcohol use: No    Alcohol/week: 0.0 standard drinks   Drug use: No   Sexual activity: Never    Comment: Virgin  Other Topics Concern   Not on file  Social History Narrative   Lives by herself    Social Determinants of Health   Financial Resource Strain: Not on file  Food Insecurity: Not on file  Transportation Needs: Not on file  Physical Activity: Not on file  Stress: Not on file  Social Connections: Not on file  Intimate Partner Violence: Not on file     ROS- All systems are reviewed and negative except as per the HPI above.  Physical Exam: Vitals:   09/12/21 1416  BP: 122/84  Pulse: (!) 58  Weight: 126.2 kg  Height: 5\' 6"  (1.676 m)    GEN- The patient is a well appearing obese female, alert and oriented x 3 today.   HEENT-head normocephalic, atraumatic, sclera clear, conjunctiva pink, hearing intact, trachea midline. Lungs- Clear to ausculation bilaterally, normal work of breathing Heart- irregular rate and rhythm, bradycardia, no murmurs, rubs or gallops  GI- soft, NT, ND, + BS Extremities- no clubbing, cyanosis, or edema MS- no significant deformity or atrophy Skin- no rash or lesion Psych- euthymic mood, full affect Neuro- strength and sensation are intact   Wt Readings from Last 3 Encounters:  09/12/21 126.2 kg  09/03/21 119.3 kg  07/24/21 118.6 kg    EKG today demonstrates  Afib, low voltage Vent. rate 58 BPM PR interval * ms QRS duration 86 ms QT/QTcB 422/414 ms  Epic records are reviewed at length today  CHA2DS2-VASc Score = 4  The patient's score is based upon: CHF History: 1 HTN History: 0 Diabetes History: 0 Stroke History: 0 Vascular Disease History: 1 (aortic atherosclerosis) Age Score: 1 Gender Score: 1        ASSESSMENT  AND PLAN: 1. Persistent atrial fibrillation  The patient's CHA2DS2-VASc score is 4, indicating a 4.8% annual risk of stroke.   Patient remains in afib. Suspect this is contributing to her fluid retention despite good rate control. Continue Eliquis 5 mg BID Will check echocardiogram Will arrange for DCCV. Check bmet/cbc. Will refer her to CSW to discuss insurance issues/coverage options.  2. Secondary Hypercoagulable State (ICD10:  D68.69) The patient is at significant risk for stroke/thromboembolism based upon her CHA2DS2-VASc Score of 3.  Continue Apixaban (Eliquis).   3. Obesity Body mass index is  44.9 kg/m. Lifestyle modification was discussed and encouraged including regular physical activity and weight reduction.  4. Snoring/witnessed apnea/daytime somnolence  Will refer for sleep study once she has insurance coverage.   5. Acute CHF Check echo as above. Her weight is up ~ 16 lbs with symptoms of lower extremity edema, abdominal bloating, and SOB NYHA class III. Will increase Lasix to 40 mg BID. Start KCL 20 meq daily. Check bmet She may be a good candidate for Alleviate-HF.  6. Aortic atherosclerosis Marked severity calcification of aortic arch. Will refer to establish care with primary cardiologist.    Follow up in the AF clinic post DCCV.    [facility] [address] [place], [place] [address] [phone] 09/12/2021 3:18 PM

## 2021-09-18 ENCOUNTER — Encounter (HOSPITAL_COMMUNITY): Payer: Self-pay | Admitting: *Deleted

## 2021-09-18 ENCOUNTER — Telehealth (HOSPITAL_COMMUNITY): Payer: Self-pay | Admitting: *Deleted

## 2021-09-18 ENCOUNTER — Other Ambulatory Visit (HOSPITAL_COMMUNITY): Payer: Self-pay | Admitting: *Deleted

## 2021-09-18 NOTE — Telephone Encounter (Signed)
Patient called stating she missed a dose of her eliquis either Saturday or Sunday. Did not want to add TEE on 11/18 dccv so rescheduled for 10/10/21 for 3 weeks of uninterrupted anticoagulation. Pt verbalized understanding of instructions.

## 2021-09-19 ENCOUNTER — Encounter (HOSPITAL_COMMUNITY): Payer: Self-pay

## 2021-09-19 ENCOUNTER — Ambulatory Visit (HOSPITAL_COMMUNITY): Admit: 2021-09-19 | Payer: Self-pay | Admitting: Internal Medicine

## 2021-09-19 SURGERY — ECHOCARDIOGRAM, TRANSESOPHAGEAL
Anesthesia: General

## 2021-09-25 ENCOUNTER — Encounter (HOSPITAL_COMMUNITY): Payer: Self-pay | Admitting: Cardiovascular Disease

## 2021-09-25 NOTE — Progress Notes (Signed)
Attempted to obtain medical history via telephone, unable to reach at this time. Unable to leave voicemail to return pre surgical testing department's phone call.   ?

## 2021-09-26 ENCOUNTER — Ambulatory Visit (HOSPITAL_COMMUNITY): Payer: Self-pay | Admitting: Physician Assistant

## 2021-10-04 DIAGNOSIS — I4891 Unspecified atrial fibrillation: Secondary | ICD-10-CM

## 2021-10-04 DIAGNOSIS — Z8711 Personal history of peptic ulcer disease: Secondary | ICD-10-CM

## 2021-10-04 HISTORY — DX: Unspecified atrial fibrillation: I48.91

## 2021-10-04 HISTORY — DX: Personal history of peptic ulcer disease: Z87.11

## 2021-10-10 ENCOUNTER — Other Ambulatory Visit: Payer: Self-pay

## 2021-10-10 ENCOUNTER — Ambulatory Visit (HOSPITAL_COMMUNITY)
Admission: RE | Admit: 2021-10-10 | Discharge: 2021-10-10 | Disposition: A | Discharge status: Home / Self Care | Payer: Medicaid Other | Source: Ambulatory Visit | Attending: Physician Assistant | Admitting: Physician Assistant

## 2021-10-10 ENCOUNTER — Ambulatory Visit (HOSPITAL_COMMUNITY): Payer: Medicaid Other | Admitting: Anesthesiology

## 2021-10-10 ENCOUNTER — Inpatient Hospital Stay (HOSPITAL_COMMUNITY)
Admission: AD | Admit: 2021-10-10 | Discharge: 2021-10-17 | DRG: 260 | Disposition: A | Discharge status: Home Health | Payer: Medicare Other | Attending: Cardiology | Admitting: Cardiology

## 2021-10-10 ENCOUNTER — Encounter (HOSPITAL_COMMUNITY): Payer: Self-pay | Admitting: Cardiovascular Disease

## 2021-10-10 ENCOUNTER — Encounter (HOSPITAL_COMMUNITY)
Admission: RE | Disposition: A | Discharge status: Home / Self Care | Payer: Self-pay | Source: Home / Self Care | Attending: Cardiovascular Disease

## 2021-10-10 ENCOUNTER — Ambulatory Visit (HOSPITAL_COMMUNITY)
Admission: RE | Admit: 2021-10-10 | Discharge: 2021-10-10 | Disposition: A | Discharge status: Home / Self Care | Payer: Medicaid Other | Attending: Cardiovascular Disease | Admitting: Cardiovascular Disease

## 2021-10-10 DIAGNOSIS — E0590 Thyrotoxicosis, unspecified without thyrotoxic crisis or storm: Secondary | ICD-10-CM | POA: Diagnosis present

## 2021-10-10 DIAGNOSIS — I5033 Acute on chronic diastolic (congestive) heart failure: Secondary | ICD-10-CM | POA: Diagnosis present

## 2021-10-10 DIAGNOSIS — Z8249 Family history of ischemic heart disease and other diseases of the circulatory system: Secondary | ICD-10-CM

## 2021-10-10 DIAGNOSIS — Z6841 Body Mass Index (BMI) 40.0 and over, adult: Secondary | ICD-10-CM

## 2021-10-10 DIAGNOSIS — I4819 Other persistent atrial fibrillation: Secondary | ICD-10-CM | POA: Insufficient documentation

## 2021-10-10 DIAGNOSIS — Z7901 Long term (current) use of anticoagulants: Secondary | ICD-10-CM | POA: Insufficient documentation

## 2021-10-10 DIAGNOSIS — D509 Iron deficiency anemia, unspecified: Secondary | ICD-10-CM | POA: Diagnosis present

## 2021-10-10 DIAGNOSIS — I480 Paroxysmal atrial fibrillation: Secondary | ICD-10-CM

## 2021-10-10 DIAGNOSIS — D539 Nutritional anemia, unspecified: Secondary | ICD-10-CM | POA: Diagnosis present

## 2021-10-10 DIAGNOSIS — Z87412 Personal history of vulvar dysplasia: Secondary | ICD-10-CM | POA: Diagnosis not present

## 2021-10-10 DIAGNOSIS — I509 Heart failure, unspecified: Secondary | ICD-10-CM | POA: Diagnosis not present

## 2021-10-10 DIAGNOSIS — Z006 Encounter for examination for normal comparison and control in clinical research program: Secondary | ICD-10-CM | POA: Diagnosis not present

## 2021-10-10 DIAGNOSIS — Z23 Encounter for immunization: Secondary | ICD-10-CM | POA: Diagnosis present

## 2021-10-10 DIAGNOSIS — I5031 Acute diastolic (congestive) heart failure: Secondary | ICD-10-CM

## 2021-10-10 DIAGNOSIS — I081 Rheumatic disorders of both mitral and tricuspid valves: Secondary | ICD-10-CM | POA: Diagnosis present

## 2021-10-10 DIAGNOSIS — D6869 Other thrombophilia: Secondary | ICD-10-CM | POA: Insufficient documentation

## 2021-10-10 DIAGNOSIS — K254 Chronic or unspecified gastric ulcer with hemorrhage: Secondary | ICD-10-CM | POA: Diagnosis present

## 2021-10-10 DIAGNOSIS — E669 Obesity, unspecified: Secondary | ICD-10-CM | POA: Insufficient documentation

## 2021-10-10 DIAGNOSIS — Z820 Family history of epilepsy and other diseases of the nervous system: Secondary | ICD-10-CM

## 2021-10-10 DIAGNOSIS — Z79899 Other long term (current) drug therapy: Secondary | ICD-10-CM

## 2021-10-10 DIAGNOSIS — I700 Atherosclerosis of aorta: Secondary | ICD-10-CM | POA: Insufficient documentation

## 2021-10-10 DIAGNOSIS — E559 Vitamin D deficiency, unspecified: Secondary | ICD-10-CM | POA: Diagnosis present

## 2021-10-10 DIAGNOSIS — D649 Anemia, unspecified: Principal | ICD-10-CM

## 2021-10-10 DIAGNOSIS — B9681 Helicobacter pylori [H. pylori] as the cause of diseases classified elsewhere: Secondary | ICD-10-CM | POA: Diagnosis present

## 2021-10-10 DIAGNOSIS — Z950 Presence of cardiac pacemaker: Secondary | ICD-10-CM | POA: Insufficient documentation

## 2021-10-10 DIAGNOSIS — I110 Hypertensive heart disease with heart failure: Secondary | ICD-10-CM | POA: Diagnosis not present

## 2021-10-10 DIAGNOSIS — Z20822 Contact with and (suspected) exposure to covid-19: Secondary | ICD-10-CM | POA: Diagnosis present

## 2021-10-10 DIAGNOSIS — I272 Pulmonary hypertension, unspecified: Secondary | ICD-10-CM | POA: Diagnosis present

## 2021-10-10 DIAGNOSIS — I4891 Unspecified atrial fibrillation: Secondary | ICD-10-CM | POA: Diagnosis present

## 2021-10-10 DIAGNOSIS — I38 Endocarditis, valve unspecified: Secondary | ICD-10-CM

## 2021-10-10 DIAGNOSIS — Z803 Family history of malignant neoplasm of breast: Secondary | ICD-10-CM

## 2021-10-10 HISTORY — DX: Anemia, unspecified: D64.9

## 2021-10-10 HISTORY — PX: CARDIOVERSION: SHX1299

## 2021-10-10 HISTORY — DX: Thyrotoxicosis, unspecified without thyrotoxic crisis or storm: E05.90

## 2021-10-10 HISTORY — DX: Heart failure, unspecified: I50.9

## 2021-10-10 HISTORY — DX: Other persistent atrial fibrillation: I48.19

## 2021-10-10 LAB — CBC
HCT: 23 % — ABNORMAL LOW (ref 36.0–46.0)
HCT: 21.7 % — ABNORMAL LOW (ref 36.0–46.0)
Hemoglobin: 6.8 g/dL — CL (ref 12.0–15.0)
Hemoglobin: 6.5 g/dL — CL (ref 12.0–15.0)
MCH: 26.4 pg (ref 26.0–34.0)
MCH: 26.5 pg (ref 26.0–34.0)
MCHC: 29.6 g/dL — ABNORMAL LOW (ref 30.0–36.0)
MCHC: 30 g/dL (ref 30.0–36.0)
MCV: 89.1 fL (ref 80.0–100.0)
MCV: 88.6 fL (ref 80.0–100.0)
Platelets: 261 10*3/uL (ref 150–400)
Platelets: 244 10*3/uL (ref 150–400)
RBC: 2.58 MIL/uL — ABNORMAL LOW (ref 3.87–5.11)
RBC: 2.45 MIL/uL — ABNORMAL LOW (ref 3.87–5.11)
RDW: 18 % — ABNORMAL HIGH (ref 11.5–15.5)
RDW: 17.8 % — ABNORMAL HIGH (ref 11.5–15.5)
WBC: 6.2 10*3/uL (ref 4.0–10.5)
WBC: 6 10*3/uL (ref 4.0–10.5)
nRBC: 0.3 % — ABNORMAL HIGH (ref 0.0–0.2)
nRBC: 0.3 % — ABNORMAL HIGH (ref 0.0–0.2)

## 2021-10-10 LAB — RETICULOCYTES
Immature Retic Fract: 28.1 % — ABNORMAL HIGH (ref 2.3–15.9)
RBC.: 2.47 MIL/uL — ABNORMAL LOW (ref 3.87–5.11)
Retic Count, Absolute: 107 10*3/uL (ref 19.0–186.0)
Retic Ct Pct: 4.3 % — ABNORMAL HIGH (ref 0.4–3.1)

## 2021-10-10 LAB — ABO/RH: ABO/RH(D): A POS

## 2021-10-10 LAB — VITAMIN B12: Vitamin B-12: 3206 pg/mL — ABNORMAL HIGH (ref 180–914)

## 2021-10-10 LAB — FERRITIN: Ferritin: 18 ng/mL (ref 11–307)

## 2021-10-10 LAB — BASIC METABOLIC PANEL
Anion gap: 11 (ref 5–15)
BUN: 16 mg/dL (ref 8–23)
CO2: 24 mmol/L (ref 22–32)
Calcium: 8.7 mg/dL — ABNORMAL LOW (ref 8.9–10.3)
Chloride: 100 mmol/L (ref 98–111)
Creatinine, Ser: 0.92 mg/dL (ref 0.44–1.00)
GFR, Estimated: >60 mL/min (ref >60)
Glucose, Bld: 85 mg/dL (ref 70–99)
Potassium: 3.9 mmol/L (ref 3.5–5.1)
Sodium: 135 mmol/L (ref 135–145)

## 2021-10-10 LAB — PREPARE RBC (CROSSMATCH)

## 2021-10-10 LAB — RESP PANEL BY RT-PCR (FLU A&B, COVID) ARPGX2
Influenza A by PCR: NEGATIVE
Influenza B by PCR: NEGATIVE
SARS Coronavirus 2 by RT PCR: NEGATIVE

## 2021-10-10 LAB — IRON AND TIBC
Iron: 15 ug/dL — ABNORMAL LOW (ref 28–170)
Saturation Ratios: 3 % — ABNORMAL LOW (ref 10.4–31.8)
TIBC: 525 ug/dL — ABNORMAL HIGH (ref 250–450)
UIBC: 510 ug/dL

## 2021-10-10 LAB — FOLATE: Folate: 13.4 ng/mL (ref >5.9)

## 2021-10-10 SURGERY — CARDIOVERSION
Anesthesia: General

## 2021-10-10 MED ORDER — FUROSEMIDE 10 MG/ML IJ SOLN
80.0000 mg | Freq: Two times a day (BID) | INTRAMUSCULAR | Status: DC
  Administered 2021-10-10 – 2021-10-17 (×14): 80 mg via INTRAVENOUS
  Filled 2021-10-10 (×14): qty 8

## 2021-10-10 MED ORDER — INFLUENZA VAC A&B SA ADJ QUAD 0.5 ML IM PRSY
0.5000 mL | PREFILLED_SYRINGE | INTRAMUSCULAR | Status: AC
  Administered 2021-10-11: 0.5 mL via INTRAMUSCULAR
  Filled 2021-10-10: qty 0.5

## 2021-10-10 MED ORDER — PANTOPRAZOLE SODIUM 40 MG PO TBEC
40.0000 mg | DELAYED_RELEASE_TABLET | Freq: Every day | ORAL | Status: DC
  Administered 2021-10-10: 40 mg via ORAL
  Filled 2021-10-10: qty 1

## 2021-10-10 MED ORDER — ACETAMINOPHEN 325 MG PO TABS: 650.0000 mg | ORAL_TABLET | ORAL | Status: DC | PRN

## 2021-10-10 MED ORDER — FERROUS SULFATE 325 (65 FE) MG PO TABS: 325.0000 mg | ORAL_TABLET | Freq: Two times a day (BID) | ORAL | 3 refills | Status: DC

## 2021-10-10 MED ORDER — ACETAMINOPHEN 325 MG PO TABS
650.0000 mg | ORAL_TABLET | Freq: Once | ORAL | Status: AC
  Administered 2021-10-10: 650 mg via ORAL
  Filled 2021-10-10: qty 2

## 2021-10-10 MED ORDER — PROPOFOL 10 MG/ML IV BOLUS
INTRAVENOUS | Status: DC | PRN
  Administered 2021-10-10: 65 mg via INTRAVENOUS

## 2021-10-10 MED ORDER — ONDANSETRON HCL 4 MG/2ML IJ SOLN: 4.0000 mg | Freq: Four times a day (QID) | INTRAMUSCULAR | Status: DC | PRN

## 2021-10-10 MED ORDER — SODIUM CHLORIDE 0.9 % IV SOLN
250.0000 mL | INTRAVENOUS | Status: DC | PRN
  Administered 2021-10-12: 250 mL via INTRAVENOUS

## 2021-10-10 MED ORDER — POTASSIUM CHLORIDE CRYS ER 20 MEQ PO TBCR
20.0000 meq | EXTENDED_RELEASE_TABLET | Freq: Every day | ORAL | Status: DC
  Administered 2021-10-10 – 2021-10-14 (×5): 20 meq via ORAL
  Filled 2021-10-10 (×5): qty 1

## 2021-10-10 MED ORDER — SODIUM CHLORIDE 0.9% IV SOLUTION: Freq: Once | INTRAVENOUS | Status: AC

## 2021-10-10 MED ORDER — FERROUS SULFATE 325 (65 FE) MG PO TABS
325.0000 mg | ORAL_TABLET | Freq: Two times a day (BID) | ORAL | Status: DC
  Administered 2021-10-11 – 2021-10-17 (×14): 325 mg via ORAL
  Filled 2021-10-10 (×15): qty 1

## 2021-10-10 MED ORDER — SODIUM CHLORIDE 0.9% FLUSH: 3.0000 mL | INTRAVENOUS | Status: DC | PRN

## 2021-10-10 MED ORDER — SODIUM CHLORIDE 0.9 % IV SOLN: INTRAVENOUS | Status: DC

## 2021-10-10 MED ORDER — APIXABAN 5 MG PO TABS
5.0000 mg | ORAL_TABLET | Freq: Two times a day (BID) | ORAL | Status: DC
  Administered 2021-10-10 – 2021-10-17 (×14): 5 mg via ORAL
  Filled 2021-10-10 (×14): qty 1

## 2021-10-10 MED ORDER — PANTOPRAZOLE SODIUM 40 MG PO TBEC: 40.0000 mg | DELAYED_RELEASE_TABLET | Freq: Every day | ORAL | Status: DC

## 2021-10-10 MED ORDER — LIDOCAINE 2% (20 MG/ML) 5 ML SYRINGE
INTRAMUSCULAR | Status: DC | PRN
  Administered 2021-10-10: 40 mg via INTRAVENOUS

## 2021-10-10 MED ORDER — VITAMIN B-12 1000 MCG PO TABS
1000.0000 ug | ORAL_TABLET | Freq: Every day | ORAL | Status: DC
  Administered 2021-10-10 – 2021-10-17 (×8): 1000 ug via ORAL
  Filled 2021-10-10 (×8): qty 1

## 2021-10-10 MED ORDER — DIPHENHYDRAMINE HCL 25 MG PO CAPS
25.0000 mg | ORAL_CAPSULE | Freq: Once | ORAL | Status: AC
  Administered 2021-10-10: 25 mg via ORAL
  Filled 2021-10-10: qty 1

## 2021-10-10 MED ORDER — SODIUM CHLORIDE 0.9% FLUSH
3.0000 mL | Freq: Two times a day (BID) | INTRAVENOUS | Status: DC
  Administered 2021-10-10 – 2021-10-17 (×10): 3 mL via INTRAVENOUS

## 2021-10-10 NOTE — Transfer of Care (Signed)
Immediate Anesthesia Transfer of Care Note  Patient: Brittney Walker  Procedure(s) Performed: CARDIOVERSION  Patient Location: Endoscopy Unit  Anesthesia Type:General  Level of Consciousness: drowsy and patient cooperative  Airway & Oxygen Therapy: Patient Spontanous Breathing and Patient connected to nasal cannula oxygen  Post-op Assessment: Report given to RN and Post -op Vital signs reviewed and stable  Post vital signs: Reviewed and stable  Last Vitals:  Vitals Value Taken Time  BP    Temp    Pulse    Resp    SpO2      Last Pain:  Vitals:   10/10/21 1125  TempSrc: Temporal  PainSc: 0-No pain         Complications: No notable events documented.

## 2021-10-10 NOTE — Anesthesia Preprocedure Evaluation (Addendum)
Anesthesia Evaluation  Patient identified by MRN, date of birth, ID band Patient awake    Reviewed: Allergy & Precautions, NPO status , Patient's Chart, lab work & pertinent test results  Airway Mallampati: II       Dental   Pulmonary    breath sounds clear to auscultation       Cardiovascular  Rhythm:Regular Rate:Normal  History noted Dr. Nyoka Cowden   Neuro/Psych    GI/Hepatic negative GI ROS, Neg liver ROS,   Endo/Other  Hypothyroidism   Renal/GU negative Renal ROS     Musculoskeletal   Abdominal   Peds  Hematology   Anesthesia Other Findings   Reproductive/Obstetrics                             Anesthesia Physical Anesthesia Plan  ASA: 3  Anesthesia Plan: General   Post-op Pain Management:    Induction: Intravenous  PONV Risk Score and Plan: Treatment may vary due to age or medical condition  Airway Management Planned: Simple Face Mask  Additional Equipment:   Intra-op Plan:   Post-operative Plan:   Informed Consent: I have reviewed the patients History and Physical, chart, labs and discussed the procedure including the risks, benefits and alternatives for the proposed anesthesia with the patient or authorized representative who has indicated his/her understanding and acceptance.     Dental advisory given  Plan Discussed with: CRNA and Anesthesiologist  Anesthesia Plan Comments:         Anesthesia Quick Evaluation

## 2021-10-10 NOTE — Discharge Instructions (Signed)

## 2021-10-10 NOTE — Progress Notes (Incomplete)
Attending note   Patient seen and discussed with NP Sharolyn Douglas, I agree with his documentation. 70 yo female history of afib diagnosed 07/18/21, obesity, clinical heart failure noted 09/12/21 afib clinic visit but has not had echo presents with anemia. She is s/p electrical cardioversion for persistent afib today, it was noted by her labs she was severely anemic and she was brought in for admission for management. Hgb was 6.5 today, down from 10.3 just 4 weeks ago.     Hgb 6.5 K 3.9 Cr 0.92 BUN 16 B12 elevated Folate normal Ferritin 18 serium iron 15 Elevated retic count EKG SR     Patient presents with severe anemia, Hgb dowp 4 points over the last month. Low ferritin and serum iron. We will plan to transfuse 2 units. With cardioversion just today would not be able to hold anticoagulation in the absence of severe active bleeding given increased stroke risk, ideally would wait 4 weeks postconversion before holding anticoag, I suspect this will limit her GI workup but in AM would discuss with GI.   Patient presents with significant volume overload.

## 2021-10-10 NOTE — H&P (Addendum)
History & Physical    Patient ID: Brittney Walker MRN: [id number], DOB/AGE: October 25, 1951   Admit date: 10/10/2021   Primary Physician: Colon Gigi Onstad, MD Primary Cardiologist: None  Patient Profile    70 year old female with a history of persistent atrial fibrillation status post cardioversion earlier this morning, hyperthyroidism status post radioactive iodine, vulvar neoplasia, normocytic anemia, and CHF, who presents for admission after finding of anemia on post cardioversion labs.  Past Medical History    Past Medical History:  Diagnosis Date   Carotid bruit    w/ neg dopplers    CHF (congestive heart failure) ([name])    Hyperthyroidism     s/p RAI 2004   Normocytic anemia    Persistent atrial fibrillation ([name])    a. Dx 07/2021; b. CHA2DS2VASc = 3-->Eliquis; c. 10/2021 s/p DCCV.   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  Pt reports a long h/o DOE but notes that she was always very active until sometime in late July/August, at which point DOE progressed and she started experiencing lower ext edema (woody) and increasing abd girth.  She believes that her wt started climbing as well.  She has never experienced chest pain. Due to progressive symptoms, patient was seen in the emergency department on September 14, after presenting with symptoms of worsening dyspnea times several weeks.  She was found to be in atrial fibrillation.  In the  setting of dyspnea and lower extremity swelling, she was placed on Lasix and followed up in A. fib clinic on September 20.  Plan was for outpatient echo, ongoing anticoagulation, and eventual cardioversion.  Of note, echocardiogram was never performed.    Unfortunately, pt put off testing due to lack of insurance, however, as atrial fibrillation persisted and dyspnea/edema/bloating worsened, she was scheduled for cardioversion after being seein 11/9.  She presented to short stay this morning and underwent successful cardioversion with 1 synchronized biphasic 120 J shock.  Of note, prior to the procedure labs were sent off.  Labs returned postprocedure, after patient had already left, showing worsening normocytic anemia with a hemoglobin of 6.8 and hematocrit of 23.0.  Serum iron was 15 with a TIBC of 525.  Ferritin was low normal at 18.  Renal function and electrolytes were stable.  In light of worsening anemia, patient was contacted and advised to present for direct admission and transfusion.  Pt just arrived to 4e24 and I was able to elicit her history as outlined above.  She feels better following cardioversion but was very dyspneic walking into the hospital just now.  Home Medications    Prior to Admission medications   Medication Sig Start Date End Date Taking? Authorizing Provider  apixaban (ELIQUIS) 5 MG TABS tablet Take 1 tablet (5 mg total) by mouth 2 (two) times daily. 07/30/21   Fenton, Clint R, PA  calcium carbonate (OS-CAL - DOSED IN MG OF ELEMENTAL CALCIUM) 1250 MG  tablet Take 1 tablet by mouth daily with breakfast.    [provider]  cholecalciferol (VITAMIN D) 1000 UNITS tablet Take 1,000 Units by mouth daily.    [provider]  ferrous sulfate 325 (65 FE) MG tablet Take 1 tablet (325 mg total) by mouth 2 (two) times daily with a meal. 10/10/21 02/07/22  Croitoru, Mihai, MD  furosemide (LASIX) 40 MG tablet Take 1 tablet (40 mg total) by mouth 2 (two) times daily. 09/12/21    Fenton, Clint R, PA  Multiple Vitamin (MULTIVITAMIN WITH MINERALS) TABS tablet Take 1 tablet by mouth in the morning.    [provider]  Multiple Vitamins-Minerals (ZINC PO) Take 1 tablet by mouth in the morning.    [provider]  potassium chloride SA (KLOR-CON) 20 MEQ tablet Take 1 tablet (20 mEq total) by mouth daily. 09/12/21   Fenton, Clint R, PA  vitamin A 10000 UNIT capsule Take 10,000 Units by mouth daily.    [provider]  vitamin B-12 (CYANOCOBALAMIN) 1000 MCG tablet Take 1,000 mcg by mouth daily.    [provider]  vitamin C (ASCORBIC ACID) 500 MG tablet Take 1,000 mg by mouth daily.    [provider]  vitamin E 400 UNIT capsule Take 400 Units by mouth daily.    [provider]    Family History    Family History  Problem Relation Age of Onset   Seizures Father        Epilepsy   Hypertension Father    Heart attack Father        ?   Breast cancer Sister 65   Heart disease Mother    Hypertension Mother    Colon cancer Neg Hx    Pancreatic cancer Neg Hx    Esophageal cancer Neg Hx    Prostate cancer Neg Hx    Rectal cancer Neg Hx    She indicated that her mother is deceased. She indicated that her father is deceased. She indicated that her sister is deceased. She indicated that the status of her neg hx is unknown.   Social History    Social History   Socioeconomic History   Marital status: Single    Spouse name: Not on file   Number of children: 0   Years of education: Not on file   Highest education level: Not on file  Occupational History   Occupation: collections   Tobacco Use   Smoking status: Never   Smokeless tobacco: Never  Vaping Use   Vaping Use: Never used  Substance and Sexual Activity   Alcohol use: No    Alcohol/week: 0.0 standard drinks   Drug use: No   Sexual activity: Never    Comment: Virgin  Other Topics Concern   Not on file  Social History Narrative   Lives by herself     Social Determinants of Health   Financial Resource Strain: Not on file  Food Insecurity: Not on file  Transportation Needs: Not on file  Physical Activity: Not on file  Stress: Not on file  Social Connections: Not on file  Intimate Partner Violence: Not on file     Review of Systems    General:  No chills, fever, night sweats or weight changes.  Cardiovascular:  No chest pain, +++ dyspnea on exertion, +++ lower ext and gut edema, +++ orthopnea, no palpitations, +++ paroxysmal nocturnal dyspnea. Dermatological: No rash, lesions/masses Respiratory: +++ cough, +++ dyspnea Urologic: No hematuria, dysuria Abdominal:  No nausea, vomiting, diarrhea, bright red blood per rectum, or hematemesis.  She has noted dark stools after taking an occasional iron tablet ("took b/c [she] had no energy") but otw has not routinely had dark stools. Neurologic:  No visual changes, wkns, changes in mental status. All other systems reviewed and are otherwise negative except as noted above.  Physical Exam    VS pending General: Pleasant, obese, NAD Psych: Normal affect. Neuro: Alert and oriented X 3. Moves all extremities spontaneously. HEENT: Normal  Neck: Supple, obese, difficult to gauge JVP.  No bruits. Lungs:  Resp regular and unlabored, bibasilar crackles. Heart: RRR, distant, 2/6 syst murmur throughout - loudest @ LLSB.  No s3, s4. Abdomen: Obese, firm, tender w/ bilat flank edema, BS + x 4.  Extremities: No clubbing, cyanosis.  2+ bilat lower ext woody edema extending to the hips/lower back. DP/PT 1+, Radials 2+ and equal bilaterally.  Labs      Lab Results  Component Value Date   WBC 6.0 10/10/2021   HGB 6.5 (LL) 10/10/2021   HCT 21.7 (L) 10/10/2021   MCV 88.6 10/10/2021   PLT 244 10/10/2021    Recent Labs  Lab 10/10/21 1014  NA 135  K 3.9  CL 100  CO2 24  BUN 16  CREATININE 0.92  CALCIUM 8.7*  GLUCOSE 85   Lab Results  Component Value Date   CHOL 183 06/24/2017   HDL  51.30 06/24/2017   LDLCALC 120 (H) 06/24/2017   TRIG 59.0 06/24/2017   Lab Results  Component Value Date   DDIMER 2.34 (H) 07/17/2021     Radiology Studies    No results found.  ECG & Cardiac Imaging    Pending @ time of admission - personally reviewed.  Assessment & Plan    1.  Acute normocytic anemia:  Pt placed on eliquis in Sept in the setting of persistent afib.  She had labs drawn 11/9 as part of pre-dccv labs showing h/h of 10.3/33.1, which was only down slightly from 9/13 and 10.7.  Repeat labs were ordered by outpt provider and drawn this AM prior to planned DCCV, showing worsening of anemia w/ h/h 6.8 and 23.0.  Labs re-run  6.5/21.7.  Fe 15, TIBC 525, ferritin 18.  She had already left when labs returned and she was contacted and advised to return for direct admission.  She has had a few dark stools, but attributes this to when she was taking an occas iron over the past few weeks, which she did due to low energy state.  She denies brbpr or any recent melena.  Will type and cross w/ plan for 2 units of prbcs tonight.  She is markedly volume overloaded and will require IV diuresis.  Will need GI consult in AM.  Add PPI.  Cont eliquis in setting of DCCV this AM.  2.  Acute CHF:  Dx w/ Afib in Sept in the setting of progressive dyspnea and edema.  She has been taking lasix BID but notes that she has had persistent woody edema and significant abd bloating.  She is markedly volume overloaded on exam.  Will add lasix 80mg  IV BID starting tonight.  F/u echo to eval EF and valvular dzs (systolic murmur present).  Further recs pending echo, but she does have very low voltage on ecgs - ? Amyloid w/u if appropriate.  3.  Persistent Afib:  Dx in Sept w/ symptoms of DOE/edema beginning in July/August.  S/p DCCV this AM.  F/u  ECG - regular on exam.  Cont eliquis.  Has not required AVN blocking agent.  F/u echo.  Needs outpt sleep study (gets insurance in Jan per pt).  4.  Systolic murmur:  f/u  echo.  Signed, Murray Hodgkins, NP 10/10/2021, 8:00 PM   Attending note     Patient seen and discussed with NP Sharolyn Douglas, I agree with his documentation. 70 yo female history of afib diagnosed 07/18/21, obesity, clinical heart failure noted 09/12/21 afib clinic visit but has not had echo presents with anemia. She is s/p electrical cardioversion for persistent afib today, it was noted by her labs she was severely anemic and she was brought in for admission for management. Hgb was 6.5 today, down from 10.3 just 4 weeks ago. She denies any visualized blood loss.  She also reports several weeks of progression LE edema and abdominal distension not improving with oral lasix.        Hgb 6.5 K 3.9 Cr 0.92 BUN 16 B12 elevated Folate normal Ferritin 18 serium iron 15 Elevated retic count EKG SR CXR pending         Patient presents with severe anemia, Hgb dowp 4 points over the last month. Low ferritin and serum iron. We will plan to transfuse 2 units. With cardioversion just today would not be able to hold anticoagulation in the absence of severe active bleeding given increased stroke risk, ideally would wait 4 weeks postconversion before holding anticoag, I suspect this will limit her GI workup but in AM would discuss with GI.    Patient presents with significant volume overload, echo is pending regarding etiology. Start IV lasix 80mg  bid tonight, obtain echo in AM   Carlyle Dolly MD

## 2021-10-10 NOTE — Anesthesia Postprocedure Evaluation (Signed)
Anesthesia Post Note  Patient: Brittney Walker  Procedure(s) Performed: CARDIOVERSION     Patient location during evaluation: Endoscopy Anesthesia Type: General Level of consciousness: awake Pain management: pain level controlled Vital Signs Assessment: post-procedure vital signs reviewed and stable Respiratory status: spontaneous breathing Cardiovascular status: stable Postop Assessment: no apparent nausea or vomiting Anesthetic complications: no   No notable events documented.  Last Vitals:  Vitals:   10/10/21 1230 10/10/21 1240  BP:  (!) 157/64  Pulse: 76 82  Resp:  18  Temp:    SpO2:      Last Pain:  Vitals:   10/10/21 1240  TempSrc:   PainSc: 0-No pain                 Kshawn Canal

## 2021-10-10 NOTE — Op Note (Signed)
Procedure: Electrical Cardioversion Indications:  Atrial Fibrillation  Procedure Details:  Consent: Risks of procedure as well as the alternatives and risks of each were explained to the (patient/caregiver).  Consent for procedure obtained.  Time Out: Verified patient identification, verified procedure, site/side was marked, verified correct patient position, special equipment/implants available, medications/allergies/relevent history reviewed, required imaging and test results available.  Performed  Patient placed on cardiac monitor, pulse oximetry, supplemental oxygen as necessary.  Sedation given:  propofol 80 mg IV, Dr. Nyoka Cowden Pacer pads placed anterior and posterior chest.  Cardioverted 1 time(s).  Cardioversion with synchronized biphasic 120J shock.  Evaluation: Findings: Post procedure EKG shows: NSR with frequent PACs and short ectopic atrial rhythm Complications: None Patient did tolerate procedure well.  At the patient's request, I easily extracted a superficial splinter from the epidermal layer of her left lateral palm with sterile tweezers.  She is advised to seek attention if the area becomes red, swollen or drains or if she has fever/chills.  After the procedure was completed, we received the result of her CBC. Hemoglobin has decreased from 10.3 one month ago to 6.8 today. She denies overt bleeding. Recheck in a few days, but want to avoid any interruption in her anticoagulation for 4 weeks after cardioversion. Check iron studies and will need stool hemoccult. Has F/U in AFib clinic next week.  Time Spent Directly with the Patient:  30 minutes   Bilaal Leib 10/10/2021, 12:16 PM

## 2021-10-10 NOTE — Progress Notes (Addendum)
Repeat labs confirm severe anemia and elevated reticulocyte count suggests recent drop in hemoglobin, despite absence of externalized bleeding per patient. Iron studies and relatively mild symptoms point to a more chronic iron deficiency. Unable to arrange timely transfusion through the [facility] infusion center. Asked Brittney Walker to return for admission and blood transfusion. 4E cardiac telemetry bed is ready for her. Family will bring to admissions. Spoke via her friend Roxy Horseman [phone].

## 2021-10-10 NOTE — Interval H&P Note (Signed)
History and Physical Interval Note:  10/10/2021 10:49 AM  Brittney Walker  has presented today for surgery, with the diagnosis of AFIB.  The various methods of treatment have been discussed with the patient and family. After consideration of risks, benefits and other options for treatment, the patient has consented to  Procedure(s): CARDIOVERSION (N/A) as a surgical intervention.  The patient's history has been reviewed, patient examined, no change in status, stable for surgery.  I have reviewed the patient's chart and labs.  Questions were answered to the patient's satisfaction.     [name]

## 2021-10-11 ENCOUNTER — Inpatient Hospital Stay (HOSPITAL_COMMUNITY): Payer: Medicare Other

## 2021-10-11 ENCOUNTER — Encounter (HOSPITAL_COMMUNITY): Payer: Self-pay | Admitting: Cardiology

## 2021-10-11 DIAGNOSIS — I509 Heart failure, unspecified: Secondary | ICD-10-CM

## 2021-10-11 DIAGNOSIS — Z006 Encounter for examination for normal comparison and control in clinical research program: Secondary | ICD-10-CM

## 2021-10-11 DIAGNOSIS — D508 Other iron deficiency anemias: Secondary | ICD-10-CM

## 2021-10-11 DIAGNOSIS — I480 Paroxysmal atrial fibrillation: Secondary | ICD-10-CM

## 2021-10-11 DIAGNOSIS — K257 Chronic gastric ulcer without hemorrhage or perforation: Secondary | ICD-10-CM

## 2021-10-11 DIAGNOSIS — R0609 Other forms of dyspnea: Secondary | ICD-10-CM

## 2021-10-11 LAB — BASIC METABOLIC PANEL
Anion gap: 9 (ref 5–15)
BUN: 14 mg/dL (ref 8–23)
CO2: 28 mmol/L (ref 22–32)
Calcium: 8.6 mg/dL — ABNORMAL LOW (ref 8.9–10.3)
Chloride: 100 mmol/L (ref 98–111)
Creatinine, Ser: 0.83 mg/dL (ref 0.44–1.00)
GFR, Estimated: >60 mL/min (ref >60)
Glucose, Bld: 105 mg/dL — ABNORMAL HIGH (ref 70–99)
Potassium: 3.7 mmol/L (ref 3.5–5.1)
Sodium: 137 mmol/L (ref 135–145)

## 2021-10-11 LAB — CBC WITH DIFFERENTIAL/PLATELET
Abs Immature Granulocytes: 0.01 10*3/uL (ref 0.00–0.07)
Basophils Absolute: 0 10*3/uL (ref 0.0–0.1)
Basophils Relative: 0 %
Eosinophils Absolute: 0.2 10*3/uL (ref 0.0–0.5)
Eosinophils Relative: 3 %
HCT: 24.6 % — ABNORMAL LOW (ref 36.0–46.0)
Hemoglobin: 7.7 g/dL — ABNORMAL LOW (ref 12.0–15.0)
Immature Granulocytes: 0 %
Lymphocytes Relative: 20 %
Lymphs Abs: 1.1 10*3/uL (ref 0.7–4.0)
MCH: 27.5 pg (ref 26.0–34.0)
MCHC: 31.3 g/dL (ref 30.0–36.0)
MCV: 87.9 fL (ref 80.0–100.0)
Monocytes Absolute: 1 10*3/uL (ref 0.1–1.0)
Monocytes Relative: 18 %
Neutro Abs: 3.3 10*3/uL (ref 1.7–7.7)
Neutrophils Relative %: 59 %
Platelets: 234 10*3/uL (ref 150–400)
RBC: 2.8 MIL/uL — ABNORMAL LOW (ref 3.87–5.11)
RDW: 17 % — ABNORMAL HIGH (ref 11.5–15.5)
WBC: 5.6 10*3/uL (ref 4.0–10.5)
nRBC: 0.5 % — ABNORMAL HIGH (ref 0.0–0.2)

## 2021-10-11 LAB — ECHOCARDIOGRAM COMPLETE
Area-P 1/2: 5.13 cm2
Height: 66 in
MV M vel: 5.04 m/s
MV Peak grad: 101.6 mmHg
Radius: 0.4 cm
S' Lateral: 2.9 cm
Weight: 4197.56 oz

## 2021-10-11 LAB — PREPARE RBC (CROSSMATCH)

## 2021-10-11 LAB — OCCULT BLOOD X 1 CARD TO LAB, STOOL: Fecal Occult Bld: NEGATIVE

## 2021-10-11 LAB — BRAIN NATRIURETIC PEPTIDE: B Natriuretic Peptide: 460.6 pg/mL — ABNORMAL HIGH (ref 0.0–100.0)

## 2021-10-11 LAB — MAGNESIUM: Magnesium: 2 mg/dL (ref 1.7–2.4)

## 2021-10-11 MED ORDER — BENZONATATE 100 MG PO CAPS
200.0000 mg | ORAL_CAPSULE | Freq: Three times a day (TID) | ORAL | Status: DC | PRN
  Administered 2021-10-11 – 2021-10-13 (×4): 200 mg via ORAL
  Filled 2021-10-11 (×4): qty 2

## 2021-10-11 MED ORDER — GUAIFENESIN 100 MG/5ML PO LIQD
5.0000 mL | ORAL | Status: DC | PRN
  Administered 2021-10-11 – 2021-10-12 (×2): 5 mL via ORAL
  Filled 2021-10-11 (×2): qty 5

## 2021-10-11 MED ORDER — SODIUM CHLORIDE 0.9% IV SOLUTION: Freq: Once | INTRAVENOUS | Status: AC

## 2021-10-11 MED ORDER — POTASSIUM CHLORIDE CRYS ER 20 MEQ PO TBCR
40.0000 meq | EXTENDED_RELEASE_TABLET | Freq: Once | ORAL | Status: AC
  Administered 2021-10-11: 40 meq via ORAL
  Filled 2021-10-11: qty 2

## 2021-10-11 MED ORDER — LIDOCAINE-EPINEPHRINE 1 %-1:100000 IJ SOLN
INTRAMUSCULAR | Status: AC
  Filled 2021-10-11: qty 1

## 2021-10-11 NOTE — Progress Notes (Signed)
Progress Note  Patient Name: Brittney Walker Date of Encounter: 10/11/2021  Primary Cardiologist: None   Subjective   Patient seen and examined at her bedside. She was lying in bed when I arrived and reports some shortness of breath but improved compared to yesterday.  Inpatient Medications    Scheduled Meds:  sodium chloride   Intravenous Once   apixaban  5 mg Oral BID   ferrous sulfate  325 mg Oral BID WC   furosemide  80 mg Intravenous BID   pantoprazole  40 mg Oral Q0600   potassium chloride SA  20 mEq Oral Daily   sodium chloride flush  3 mL Intravenous Q12H   vitamin B-12  1,000 mcg Oral Daily   Continuous Infusions:  sodium chloride     PRN Meds: sodium chloride, acetaminophen, ondansetron (ZOFRAN) IV, sodium chloride flush   Vital Signs    Vitals:   10/11/21 0416 10/11/21 0754 10/11/21 0800 10/11/21 1100  BP: (!) 141/68 (!) 135/58 (!) 142/70 136/67  Pulse: 71 71 75 74  Resp: 20 (!) 21 (!) 22 (!) 27  Temp: 98.5 F (36.9 C) 97.7 F (36.5 C) (!) 97.5 F (36.4 C) 97.6 F (36.4 C)  TempSrc: Oral Oral Oral Oral  SpO2: 100% 96% 98% 98%  Weight: 119 kg     Height:        Intake/Output Summary (Last 24 hours) at 10/11/2021 1139 Last data filed at 10/11/2021 1100 Gross per 24 hour  Intake 1250.7 ml  Output 2650 ml  Net -1399.3 ml   Filed Weights   10/10/21 1959 10/11/21 0416  Weight: 119.6 kg 119 kg    Telemetry    Sinus rhythm - Personally Reviewed  ECG    None today - Personally Reviewed  Physical Exam   GEN: No acute distress.   Neck: No JVD Cardiac: HMC,9/4 holosystolic murmurs, rubs, or gallops.  Respiratory: Clear to auscultation bilaterally. GI: Soft, nontender, non-distended  MS: +2 bilateral pitting edema; No deformity. Neuro:  Nonfocal  Psych: Normal affect   Labs    Chemistry Recent Labs  Lab 10/10/21 1014 10/11/21 0626  NA 135 137  K 3.9 3.7  CL 100 100  CO2 24 28  GLUCOSE 85 105*  BUN 16 14  CREATININE 0.92 0.83   CALCIUM 8.7* 8.6*  GFRNONAA >60 >60  ANIONGAP 11 9     Hematology Recent Labs  Lab 10/10/21 1014 10/10/21 1258 10/11/21 0626  WBC 6.2 6.0 5.6  RBC 2.58* 2.45*  2.47* 2.80*  HGB 6.8* 6.5* 7.7*  HCT 23.0* 21.7* 24.6*  MCV 89.1 88.6 87.9  MCH 26.4 26.5 27.5  MCHC 29.6* 30.0 31.3  RDW 18.0* 17.8* 17.0*  PLT 261 244 234    Cardiac EnzymesNo results for input(s): TROPONINI in the last 168 hours. No results for input(s): TROPIPOC in the last 168 hours.   BNP Recent Labs  Lab 10/11/21 0626  BNP 460.6*     DDimer No results for input(s): DDIMER in the last 168 hours.   Radiology    DG Chest 2 View  Result Date: 10/11/2021 CLINICAL DATA:  Difficulty breathing EXAM: CHEST - 2 VIEW COMPARISON:  Previous studies including the examination of 09/03/2021 FINDINGS: Transverse diameter of heart is increased. There are no signs of alveolar pulmonary edema. There is poor inspiration. There is blunting of lateral CP angles. There is no pneumothorax. Patchy infiltrates are seen in the right lower lung fields. There is interval improvement in aeration in  the right parahilar region. IMPRESSION: Cardiomegaly. There are no signs of pulmonary edema. Increased density in the right lower lung fields may suggest pleural effusion and possibly underlying atelectasis. Electronically Signed   By: Elmer Picker M.D.   On: 10/11/2021 08:48    Cardiac Studies   Echo pending   Patient Profile     70 y.o. female with history of persistent atrial fibrillation status post cardioversion yesterday, now with symptomatic anemia on Eliquis, hypothyroidism status post radioactive iodine and obesity.  Assessment & Plan    Paroxysmal atrial fibrillation status post cardioversion, now in sinus rhythm Acute exacerbation of heart failure  Symptomatic anemia Morbid obesity  She is in sinus rhythm -converted yesterday.  She is on Eliquis 5 mg twice daily.  She has not required AV nodal blocking rate control  agents and as well as her anticoagulation needs to be continued for minimum 4 weeks from yesterday giving her cardioversion.  Source of A. fib could be multifactorial, echo is pending to understand more about valvular heart disease as well as if there is any cardiomyopathy but the patient will also need a sleep study can be addressed in the outpatient setting.  She also is in acute exacerbation heart failure which I suspect may be multifactorial in the setting of atrial fibrillation, her symptomatic anemia as well as potential valvular heart disease giving her significant whole systolic murmur suggesting mitral regurgitation versus tricuspid regurgitation.  Her echocardiogram is with a pending.  In terms of her symptomatic anemia her hemoglobin dropped to 6.5 she is status post packed red blood cells transfusion and  went up to 7.7.  I would like to give the patient another unit of PRBC to keep her hemoglobin greater than or close to 9 as much as possible.  Going to get GI to see the patient and evaluate her as she may potentially need to be evaluated with a EGD.  Iron panel also does show evidence of iron deficiency anemia so if there is no occult GI bleeding she could benefit from iron transfusions as well.  Also consult social work to help with factors of social determinants of health for this patient.  Morbid obesity-outpatient referral to healthy weight and wellness.   For questions or updates, please contact [place] Please consult [email or web address] for contact info under Cardiology/STEMI.      Signed, Berniece Salines, DO  10/11/2021, 11:39 AM

## 2021-10-11 NOTE — Consult Note (Addendum)
Consultation  Referring Provider: Dr. Erlene Quan    Primary Care Physician:  Colon Branch, MD Primary Gastroenterologist: Dr. Deatra Ina        Reason for Consultation: Anemia            HPI:   Brittney Walker is a 70 y.o. female with a past medical history of persistent A. fib status post cardioversion yesterday morning on Eliquis, hyperthyroidism status post radioactive iodine, normocytic anemia and CHF, who returns to the hospital yesterday for a finding of anemia on post cardioversion labs.    At time of admission patient reported a long history of dyspnea on exertion but was always very active until sometime in late July/August at which point this progression she started to experience lower extremity edema and increased abdominal girth.  Due to progressive symptoms she was seen in the ER September 14 and found to be in A. fib.  She was placed on Lasix and followed up in the A. fib clinic on September 20 plans for outpatient echo.  She was eventually scheduled for cardioversion on on 12/6.  Labs returned postprocedure and patient had already left which showed a worsening normocytic anemia with a hemoglobin of 6.8 and hematocrit of 23.  Serum Iron 15 with a TIBC of 525, ferritin low normal at 18.  Patient told to come back in and have transfusion.    Today, patient is found with her friend by her bedside, explains that she does not know a lot of what is going on but was told to come back to the hospital, so she did.  She was just prescribed iron as of yesterday, but previous to this was unaware of anemia.  Does describe occasional episodes of vomiting which sound very intermittent, had 2 episodes last week, don't seem to be brought on by anything specific, does report that it looks "dark/black" at times.  No history of heartburn or reflux.      Also describes generalized abdominal pain and distention over the past 3 to 4 months as well as some swelling in her legs.    Aware of her last colonoscopy  with Dr. Deatra Ina.    Denies fever, chills, weight loss, change in bowel habits or symptoms that awaken her from sleep.  GI history: 03/05/24/15 colonoscopy with mild diverticulosis in the sigmoid colon and internal hemorrhoids  Past Medical History:  Diagnosis Date   Carotid bruit    w/ neg dopplers    CHF (congestive heart failure) ([place])    Hyperthyroidism     s/p RAI 2004   Normocytic anemia    Persistent atrial fibrillation ([place])    a. Dx 07/2021; b. CHA2DS2VASc = 3-->Eliquis; c. 10/2021 s/p DCCV.   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   Breast cancer Sister 17   Heart disease Mother    Hypertension Mother    Colon cancer Neg Hx  Pancreatic cancer Neg Hx    Esophageal cancer Neg Hx    Prostate cancer Neg Hx    Rectal cancer Neg Hx      Social History   Tobacco Use   Smoking status: Never   Smokeless tobacco: Never  Vaping Use   Vaping Use: Never used  Substance Use Topics   Alcohol use: No    Alcohol/week: 0.0 standard drinks   Drug use: No    Prior to Admission medications   Medication Sig Start Date End Date Taking? Authorizing Provider  apixaban (ELIQUIS) 5 MG TABS tablet Take 1 tablet (5 mg total) by mouth 2 (two) times daily. 07/30/21  Yes Fenton, Clint R, PA  calcium carbonate (OS-CAL - DOSED IN MG OF ELEMENTAL CALCIUM) 1250 MG tablet Take 1,250 mg by mouth daily with breakfast.   Yes [provider]  cholecalciferol (VITAMIN D) 1000 UNITS tablet Take 1,000 Units by mouth daily.   Yes [provider]  ferrous sulfate 325 (65 FE)  MG tablet Take 1 tablet (325 mg total) by mouth 2 (two) times daily with a meal. 10/10/21 02/07/22 Yes Croitoru, Mihai, MD  furosemide (LASIX) 40 MG tablet Take 1 tablet (40 mg total) by mouth 2 (two) times daily. 09/12/21  Yes Fenton, Clint R, PA  Multiple Vitamin (MULTIVITAMIN WITH MINERALS) TABS tablet Take 1 tablet by mouth in the morning.   Yes [provider]  Multiple Vitamins-Minerals (ZINC PO) Take 1 tablet by mouth in the morning.   Yes [provider]  polyvinyl alcohol (LIQUIFILM TEARS) 1.4 % ophthalmic solution Place 1 drop into both eyes as needed for dry eyes.   Yes [provider]  potassium chloride SA (KLOR-CON) 20 MEQ tablet Take 1 tablet (20 mEq total) by mouth daily. 09/12/21  Yes Fenton, Clint R, PA  vitamin A 10000 UNIT capsule Take 10,000 Units by mouth daily.   Yes [provider]  vitamin B-12 (CYANOCOBALAMIN) 1000 MCG tablet Take 1,000 mcg by mouth daily.   Yes [provider]  vitamin C (ASCORBIC ACID) 500 MG tablet Take 1,000 mg by mouth daily.   Yes [provider]  vitamin E 400 UNIT capsule Take 400 Units by mouth daily.   Yes [provider]    Current Facility-Administered Medications  Medication Dose Route Frequency Provider Last Rate Last Admin   0.9 %  sodium chloride infusion (Manually program via Guardrails IV Fluids)   Intravenous Once Tobb, Kardie, DO       0.9 %  sodium chloride infusion  250 mL Intravenous PRN Theora Gianotti, NP       acetaminophen (TYLENOL) tablet 650 mg  650 mg Oral Q4H PRN Theora Gianotti, NP       apixaban Arne Cleveland) tablet 5 mg  5 mg Oral BID Theora Gianotti, NP   5 mg at 10/11/21 1049   ferrous sulfate tablet 325 mg  325 mg Oral BID WC Theora Gianotti, NP   325 mg at 10/11/21 0801   furosemide (LASIX) injection 80 mg  80 mg Intravenous BID Theora Gianotti, NP   80 mg at 10/11/21 0801   ondansetron (ZOFRAN) injection 4 mg  4  mg Intravenous Q6H PRN Theora Gianotti, NP       pantoprazole (PROTONIX) EC tablet 40 mg  40 mg Oral Q0600 Theora Gianotti, NP   40 mg at 10/10/21 2132   potassium chloride SA (KLOR-CON M) CR tablet 20 mEq  20  mEq Oral Daily Theora Gianotti, NP   20 mEq at 10/11/21 1049   sodium chloride flush (NS) 0.9 % injection 3 mL  3 mL Intravenous Q12H Theora Gianotti, NP   3 mL at 10/11/21 1055   sodium chloride flush (NS) 0.9 % injection 3 mL  3 mL Intravenous PRN Theora Gianotti, NP       vitamin B-12 (CYANOCOBALAMIN) tablet 1,000 mcg  1,000 mcg Oral Daily Theora Gianotti, NP   1,000 mcg at 10/11/21 1049    Allergies as of 10/10/2021   (No Known Allergies)     Review of Systems:    Constitutional: No weight loss, fever or chills Skin: No rash  Cardiovascular: No chest pain Respiratory: +DOE Gastrointestinal: See HPI and otherwise negative Genitourinary: No dysuria  Neurological: No headache, dizziness or syncope Musculoskeletal: No new muscle or joint pain Hematologic: No bleeding Psychiatric: No history of depression or anxiety    Physical Exam:  Vital signs in last 24 hours: Temp:  [97.5 F (36.4 C)-99 F (37.2 C)] 97.5 F (36.4 C) (12/08 0800) Pulse Rate:  [70-86] 75 (12/08 0800) Resp:  [18-23] 22 (12/08 0800) BP: (122-157)/(58-70) 142/70 (12/08 0800) SpO2:  [95 %-100 %] 98 % (12/08 0800) Weight:  [119 kg-119.6 kg] 119 kg (12/08 0416) Last BM Date: 10/10/21 General:   Pleasant obese, African-American female appears to be in NAD, Well developed, Well nourished, alert and cooperative Head:  Normocephalic and atraumatic. Eyes:   PEERL, EOMI. No icterus. Conjunctiva pink. Ears:  Normal auditory acuity. Neck:  Supple Throat: Oral cavity and pharynx without inflammation, swelling or lesion.  Lungs: Respirations even and unlabored. Lungs clear to auscultation bilaterally.   No wheezes, crackles, or rhonchi.  Heart: Normal S1,  S2. +murmur, Regular rate and rhythm. + Bilateral lower extremity edema to the level of the thigh Abdomen:  Soft, nondistended, mild generalized ttp, edema. No rebound or guarding. Normal bowel sounds. No appreciable masses or hepatomegaly. Rectal:  Not performed.  Msk:  Symmetrical without gross deformities. Extremities:  No deformity or joint abnormality.  Neurologic:  Alert and  oriented x4;  grossly normal neurologically.  Skin:   Dry and intact without significant lesions or rashes. Psychiatric: Demonstrates good judgement and reason without abnormal affect or behaviors.  LAB RESULTS: Recent Labs    10/10/21 1014 10/10/21 1258 10/11/21 0626  WBC 6.2 6.0 5.6  HGB 6.8* 6.5* 7.7*  HCT 23.0* 21.7* 24.6*  PLT 261 244 234   BMET Recent Labs    10/10/21 1014 10/11/21 0626  NA 135 137  K 3.9 3.7  CL 100 100  CO2 24 28  GLUCOSE 85 105*  BUN 16 14  CREATININE 0.92 0.83  CALCIUM 8.7* 8.6*   STUDIES: DG Chest 2 View  Result Date: 10/11/2021 CLINICAL DATA:  Difficulty breathing EXAM: CHEST - 2 VIEW COMPARISON:  Previous studies including the examination of 09/03/2021 FINDINGS: Transverse diameter of heart is increased. There are no signs of alveolar pulmonary edema. There is poor inspiration. There is blunting of lateral CP angles. There is no pneumothorax. Patchy infiltrates are seen in the right lower lung fields. There is interval improvement in aeration in the right parahilar region. IMPRESSION: Cardiomegaly. There are no signs of pulmonary edema. Increased density in the right lower lung fields may suggest pleural effusion and possibly underlying atelectasis. Electronically Signed   By: Elmer Picker M.D.   On: 10/11/2021 08:48      Impression / Plan:  Impression: 1.  Acute on chronic anemia: 06/24/2017 hemoglobin 12.9 --> 07/17/2021 hemoglobin 11 --> 09/03/2021 hemoglobin 10.7 --> 09/12/2021 hemoglobin  10.3--> 12/7 hemoglobin 6.8--> 6.5--> 2 unit PRBCs--> 7.7--> another  unit PRBCs ordered, iron 15, TIBC 525, ferritin 18, fecal occult negative; consider GI source 2.  Acute CHF: With lower extremity and abdominal edema, Lasix 80 mg IV twice daily was started yesterday 3.  A. fib: Status post cardioversion 10/10/2021, on Eliquis, last dose 10/12/2019 2 AM  Plan: 1.  Patient would benefit from EGD and colonoscopy.  Discussed this with cardiology, they do not feel that the patient can hold her Eliquis right now given recent cardioversion, so plan is for solely diagnostic EGD tomorrow with Dr. Loletha Carrow.  Then would recommend trending hemoglobin, if remains stable after transfusion, then could consider Colonoscopy as outpatient. 2.  For now patient can continue on regular diet.  NPO at midnight. 3.  Continue to monitor hemoglobin with transfusion as needed.  Thank you for your kind consultation, we will continue to follow.  Lavone Nian [facility]  10/11/2021, 11:36 AM   I have reviewed the entire case in detail with the above APP and discussed the plan in detail.  Therefore, I agree with the diagnoses recorded above. In addition,  I have personally interviewed and examined the patient in the presence of her nurse.  My additional thoughts are as follows:  Complex case of iron deficiency anemia with recent marked hemoglobin dropped discovered after cardioversion resumption of oral anticoagulation.  Patient does not seem to have any chronic digestive symptoms, and she was reportedly heme-negative.  Nevertheless, I am concerned she may have had a recent perhaps intermittent source of GI blood loss. Endoscopic work-up made more challenging by patient's anticoagulation that cannot be interrupted for least the next 4 weeks.  I offered her an inpatient upper endoscopy while on oral anticoagulation, and she was agreeable.  She understands the risks are increased, though I would not plan to take biopsies or other intervention during the procedure.  She has bibasilar crackles,  breathing comfortably on room air.  She has pulmonary edema today and is getting diuretics and transfusion.  So we tentatively have her on the schedule tomorrow for an EGD, but will see what her respiratory status is in the morning and delay the procedure to the following day if necessary.  Nelida Meuse III Office:[phone]

## 2021-10-11 NOTE — Progress Notes (Signed)
Heart Failure Navigator Progress Note  Assessed for Heart & Vascular TOC clinic readiness.  Patient does not meet criteria due to questioning valvular heart disease and contributing factors of AF causing overload issues.  ECHO: 60-65%, severe TR, mild-mod MR.    Navigator available for educational resources of patient.   Pricilla Holm, MSN, RN Heart Failure Nurse Navigator [phone]

## 2021-10-11 NOTE — Progress Notes (Signed)
  Echocardiogram 2D Echocardiogram has been performed.  Brittney Walker 10/11/2021, 10:53 AM

## 2021-10-11 NOTE — Research (Signed)
Alleviate Informed Consent   Subject Name: Brittney Walker  Subject met inclusion and exclusion criteria.  The informed consent form, study requirements and expectations were reviewed with the subject and questions and concerns were addressed prior to the signing of the consent form.  The subject verbalized understanding of the trial requirements.  The subject agreed to participate in the Alleviate trial and signed the informed consent at 1200 on 10/11/2021.  The informed consent was obtained prior to performance of any protocol-specific procedures for the subject.  A copy of the signed informed consent was given to the subject and a copy was placed in the subject's medical record.   Sreekar Broyhill    Reviewed of history and physical by investigator.   INCLUSION '[x]'  '[]'  Patient has NYHA Class II or III heart failure per most recent assessment, irrespective of left ventricular ejection fraction (LVEF)  '[x]'  '[]'  Patient has documented recent history of symptomatic heart failure, defined as meeting any one of the following three criteria: 1. Hospital admission with primary diagnosis of HF within the last 12 months, OR 2. Intravenous HF therapy (e.g. IV diuretics/vasodilators) or ultrafiltration within the last 6 months, OR 3. Patient had the following BNP/NT-proBNP6 within the last 3 months: - If LVEF ? 50%, then BNP> 150 pg/ml or NT-proBNP > 450 pg/ml OR - If LVEF is <50%, then BNP> 300 pg/ml or NT-proBNP > 900 pg/ml  '[x]'  '[]'  Patient is willing and able to comply with the protocol, including LINQ ICM insertion, CareLink transmissions (including adequate connectivity), study visits and remote care directions  '[x]'  '[]'  Patient is 71 years of age or older  '[x]'  '[]'  Patient has a life expectancy of 12 months or more    EXCLUSION '[]'  '[x]'  Patient is currently implanted with a cardiovascular implantable electronic device (CIED)7 (e.g. ICM8, pacemaker, ICD, CRT-D or CRT-P device) or hemodynamic monitor   '[]'  '[x]'   Patient is receiving temporary or permanent mechanical circulatory support   '[]'  '[x]'  Patient had MI or PCI/CABG within past 90 days   '[]'  '[x]'  Patient has had a heart transplant, or is currently on heart transplant list   '[]'  '[x]'  Patient has severe valve stenosis on echocardiogram   '[]'  '[x]'  Patient has primary pulmonary hypertension (pre-capillary, WHO group 1,3,4,5)   '[]'  '[x]'  Patient is on chronic intravenous inotropic drug therapy (e.g. dobutamine, milrinone)   '[]'  '[x]'  Patient has severe renal impairment (eGFR <30 mL/min)   '[]'  '[x]'  Patient has systolic blood pressure of < 90 mmHg at the time of enrollment   '[]'  '[x]'  Patient is on chronic renal dialysis   '[]'  '[x]'  Patient is unable to undergo one round of PRN medication intervention (i.e. 4 days of increased diuretics dose), based on the judgement of the investigator (e.g. known history of side effects or intolerance to PRN dosing of diuretics)   '[]'  '[x]'  Patient has liver disease, defined as AST/ALT >5x normal, or bilirubin >2x normal   '[]'  '[x]'  Patient has serum albumin < 3 g/dL   '[]'  '[x]'  Patient has hypertrophic obstructive cardiomyopathy, constrictive pericarditis or amyloidosis   '[]'  '[x]'   Patient has complex adult congenital heart disease   '[]'  '[x]'  Patient has active cancer involving chemotherapy and/or radiation therapy   '[]'  '[x]'  Patient weighs more than 500 pounds  '[]'  '[x]'  Patient is pregnant (all females of child-bearing potential must have a negative pregnancy test within 1 week of enrollment)

## 2021-10-11 NOTE — H&P (View-Only) (Signed)
Consultation  Referring Provider: Dr. Erlene Quan    Primary Care Physician:  Colon Branch, MD Primary Gastroenterologist: Dr. Deatra Ina        Reason for Consultation: Anemia            HPI:   Brittney Walker is a 70 y.o. female with a past medical history of persistent A. fib status post cardioversion yesterday morning on Eliquis, hyperthyroidism status post radioactive iodine, normocytic anemia and CHF, who returns to the hospital yesterday for a finding of anemia on post cardioversion labs.    At time of admission patient reported a long history of dyspnea on exertion but was always very active until sometime in late July/August at which point this progression she started to experience lower extremity edema and increased abdominal girth.  Due to progressive symptoms she was seen in the ER September 14 and found to be in A. fib.  She was placed on Lasix and followed up in the A. fib clinic on September 20 plans for outpatient echo.  She was eventually scheduled for cardioversion on on 12/6.  Labs returned postprocedure and patient had already left which showed a worsening normocytic anemia with a hemoglobin of 6.8 and hematocrit of 23.  Serum Iron 15 with a TIBC of 525, ferritin low normal at 18.  Patient told to come back in and have transfusion.    Today, patient is found with her friend by her bedside, explains that she does not know a lot of what is going on but was told to come back to the hospital, so she did.  She was just prescribed iron as of yesterday, but previous to this was unaware of anemia.  Does describe occasional episodes of vomiting which sound very intermittent, had 2 episodes last week, don't seem to be brought on by anything specific, does report that it looks "dark/black" at times.  No history of heartburn or reflux.      Also describes generalized abdominal pain and distention over the past 3 to 4 months as well as some swelling in her legs.    Aware of her last colonoscopy  with Dr. Deatra Ina.    Denies fever, chills, weight loss, change in bowel habits or symptoms that awaken her from sleep.  GI history: 03/05/24/15 colonoscopy with mild diverticulosis in the sigmoid colon and internal hemorrhoids  Past Medical History:  Diagnosis Date   Carotid bruit    w/ neg dopplers    CHF (congestive heart failure) ([place])    Hyperthyroidism     s/p RAI 2004   Normocytic anemia    Persistent atrial fibrillation ([place])    a. Dx 07/2021; b. CHA2DS2VASc = 3-->Eliquis; c. 10/2021 s/p DCCV.   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   Breast cancer Sister 30   Heart disease Mother    Hypertension Mother    Colon cancer Neg Hx  Pancreatic cancer Neg Hx    Esophageal cancer Neg Hx    Prostate cancer Neg Hx    Rectal cancer Neg Hx      Social History   Tobacco Use   Smoking status: Never   Smokeless tobacco: Never  Vaping Use   Vaping Use: Never used  Substance Use Topics   Alcohol use: No    Alcohol/week: 0.0 standard drinks   Drug use: No    Prior to Admission medications   Medication Sig Start Date End Date Taking? Authorizing Provider  apixaban (ELIQUIS) 5 MG TABS tablet Take 1 tablet (5 mg total) by mouth 2 (two) times daily. 07/30/21  Yes Fenton, Clint R, PA  calcium carbonate (OS-CAL - DOSED IN MG OF ELEMENTAL CALCIUM) 1250 MG tablet Take 1,250 mg by mouth daily with breakfast.   Yes [provider]  cholecalciferol (VITAMIN D) 1000 UNITS tablet Take 1,000 Units by mouth daily.   Yes [provider]  ferrous sulfate 325 (65 FE)  MG tablet Take 1 tablet (325 mg total) by mouth 2 (two) times daily with a meal. 10/10/21 02/07/22 Yes Croitoru, Mihai, MD  furosemide (LASIX) 40 MG tablet Take 1 tablet (40 mg total) by mouth 2 (two) times daily. 09/12/21  Yes Fenton, Clint R, PA  Multiple Vitamin (MULTIVITAMIN WITH MINERALS) TABS tablet Take 1 tablet by mouth in the morning.   Yes [provider]  Multiple Vitamins-Minerals (ZINC PO) Take 1 tablet by mouth in the morning.   Yes [provider]  polyvinyl alcohol (LIQUIFILM TEARS) 1.4 % ophthalmic solution Place 1 drop into both eyes as needed for dry eyes.   Yes [provider]  potassium chloride SA (KLOR-CON) 20 MEQ tablet Take 1 tablet (20 mEq total) by mouth daily. 09/12/21  Yes Fenton, Clint R, PA  vitamin A 10000 UNIT capsule Take 10,000 Units by mouth daily.   Yes [provider]  vitamin B-12 (CYANOCOBALAMIN) 1000 MCG tablet Take 1,000 mcg by mouth daily.   Yes [provider]  vitamin C (ASCORBIC ACID) 500 MG tablet Take 1,000 mg by mouth daily.   Yes [provider]  vitamin E 400 UNIT capsule Take 400 Units by mouth daily.   Yes [provider]    Current Facility-Administered Medications  Medication Dose Route Frequency Provider Last Rate Last Admin   0.9 %  sodium chloride infusion (Manually program via Guardrails IV Fluids)   Intravenous Once Tobb, Kardie, DO       0.9 %  sodium chloride infusion  250 mL Intravenous PRN Theora Gianotti, NP       acetaminophen (TYLENOL) tablet 650 mg  650 mg Oral Q4H PRN Theora Gianotti, NP       apixaban Arne Cleveland) tablet 5 mg  5 mg Oral BID Theora Gianotti, NP   5 mg at 10/11/21 1049   ferrous sulfate tablet 325 mg  325 mg Oral BID WC Theora Gianotti, NP   325 mg at 10/11/21 0801   furosemide (LASIX) injection 80 mg  80 mg Intravenous BID Theora Gianotti, NP   80 mg at 10/11/21 0801   ondansetron (ZOFRAN) injection 4 mg  4  mg Intravenous Q6H PRN Theora Gianotti, NP       pantoprazole (PROTONIX) EC tablet 40 mg  40 mg Oral Q0600 Theora Gianotti, NP   40 mg at 10/10/21 2132   potassium chloride SA (KLOR-CON M) CR tablet 20 mEq  20  mEq Oral Daily Theora Gianotti, NP   20 mEq at 10/11/21 1049   sodium chloride flush (NS) 0.9 % injection 3 mL  3 mL Intravenous Q12H Theora Gianotti, NP   3 mL at 10/11/21 1055   sodium chloride flush (NS) 0.9 % injection 3 mL  3 mL Intravenous PRN Theora Gianotti, NP       vitamin B-12 (CYANOCOBALAMIN) tablet 1,000 mcg  1,000 mcg Oral Daily Theora Gianotti, NP   1,000 mcg at 10/11/21 1049    Allergies as of 10/10/2021   (No Known Allergies)     Review of Systems:    Constitutional: No weight loss, fever or chills Skin: No rash  Cardiovascular: No chest pain Respiratory: +DOE Gastrointestinal: See HPI and otherwise negative Genitourinary: No dysuria  Neurological: No headache, dizziness or syncope Musculoskeletal: No new muscle or joint pain Hematologic: No bleeding Psychiatric: No history of depression or anxiety    Physical Exam:  Vital signs in last 24 hours: Temp:  [97.5 F (36.4 C)-99 F (37.2 C)] 97.5 F (36.4 C) (12/08 0800) Pulse Rate:  [70-86] 75 (12/08 0800) Resp:  [18-23] 22 (12/08 0800) BP: (122-157)/(58-70) 142/70 (12/08 0800) SpO2:  [95 %-100 %] 98 % (12/08 0800) Weight:  [119 kg-119.6 kg] 119 kg (12/08 0416) Last BM Date: 10/10/21 General:   Pleasant obese, African-American female appears to be in NAD, Well developed, Well nourished, alert and cooperative Head:  Normocephalic and atraumatic. Eyes:   PEERL, EOMI. No icterus. Conjunctiva pink. Ears:  Normal auditory acuity. Neck:  Supple Throat: Oral cavity and pharynx without inflammation, swelling or lesion.  Lungs: Respirations even and unlabored. Lungs clear to auscultation bilaterally.   No wheezes, crackles, or rhonchi.  Heart: Normal S1,  S2. +murmur, Regular rate and rhythm. + Bilateral lower extremity edema to the level of the thigh Abdomen:  Soft, nondistended, mild generalized ttp, edema. No rebound or guarding. Normal bowel sounds. No appreciable masses or hepatomegaly. Rectal:  Not performed.  Msk:  Symmetrical without gross deformities. Extremities:  No deformity or joint abnormality.  Neurologic:  Alert and  oriented x4;  grossly normal neurologically.  Skin:   Dry and intact without significant lesions or rashes. Psychiatric: Demonstrates good judgement and reason without abnormal affect or behaviors.  LAB RESULTS: Recent Labs    10/10/21 1014 10/10/21 1258 10/11/21 0626  WBC 6.2 6.0 5.6  HGB 6.8* 6.5* 7.7*  HCT 23.0* 21.7* 24.6*  PLT 261 244 234   BMET Recent Labs    10/10/21 1014 10/11/21 0626  NA 135 137  K 3.9 3.7  CL 100 100  CO2 24 28  GLUCOSE 85 105*  BUN 16 14  CREATININE 0.92 0.83  CALCIUM 8.7* 8.6*   STUDIES: DG Chest 2 View  Result Date: 10/11/2021 CLINICAL DATA:  Difficulty breathing EXAM: CHEST - 2 VIEW COMPARISON:  Previous studies including the examination of 09/03/2021 FINDINGS: Transverse diameter of heart is increased. There are no signs of alveolar pulmonary edema. There is poor inspiration. There is blunting of lateral CP angles. There is no pneumothorax. Patchy infiltrates are seen in the right lower lung fields. There is interval improvement in aeration in the right parahilar region. IMPRESSION: Cardiomegaly. There are no signs of pulmonary edema. Increased density in the right lower lung fields may suggest pleural effusion and possibly underlying atelectasis. Electronically Signed   By: Elmer Picker M.D.   On: 10/11/2021 08:48      Impression / Plan:  Impression: 1.  Acute on chronic anemia: 06/24/2017 hemoglobin 12.9 --> 07/17/2021 hemoglobin 11 --> 09/03/2021 hemoglobin 10.7 --> 09/12/2021 hemoglobin  10.3--> 12/7 hemoglobin 6.8--> 6.5--> 2 unit PRBCs--> 7.7--> another  unit PRBCs ordered, iron 15, TIBC 525, ferritin 18, fecal occult negative; consider GI source 2.  Acute CHF: With lower extremity and abdominal edema, Lasix 80 mg IV twice daily was started yesterday 3.  A. fib: Status post cardioversion 10/10/2021, on Eliquis, last dose 10/12/2019 2 AM  Plan: 1.  Patient would benefit from EGD and colonoscopy.  Discussed this with cardiology, they do not feel that the patient can hold her Eliquis right now given recent cardioversion, so plan is for solely diagnostic EGD tomorrow with Dr. Loletha Carrow.  Then would recommend trending hemoglobin, if remains stable after transfusion, then could consider Colonoscopy as outpatient. 2.  For now patient can continue on regular diet.  NPO at midnight. 3.  Continue to monitor hemoglobin with transfusion as needed.  Thank you for your kind consultation, we will continue to follow.  [facility]one [facility]  10/11/2021, 11:36 AM   I have reviewed the entire case in detail with the above APP and discussed the plan in detail.  Therefore, I agree with the diagnoses recorded above. In addition,  I have personally interviewed and examined the patient in the presence of her nurse.  My additional thoughts are as follows:  Complex case of iron deficiency anemia with recent marked hemoglobin dropped discovered after cardioversion resumption of oral anticoagulation.  Patient does not seem to have any chronic digestive symptoms, and she was reportedly heme-negative.  Nevertheless, I am concerned she may have had a recent perhaps intermittent source of GI blood loss. Endoscopic work-up made more challenging by patient's anticoagulation that cannot be interrupted for least the next 4 weeks.  I offered her an inpatient upper endoscopy while on oral anticoagulation, and she was agreeable.  She understands the risks are increased, though I would not plan to take biopsies or other intervention during the procedure.  She has bibasilar crackles,  breathing comfortably on room air.  She has pulmonary edema today and is getting diuretics and transfusion.  So we tentatively have her on the schedule tomorrow for an EGD, but will see what her respiratory status is in the morning and delay the procedure to the following day if necessary.  Nelida Meuse III [phone]

## 2021-10-12 ENCOUNTER — Inpatient Hospital Stay (HOSPITAL_COMMUNITY): Payer: Medicare Other | Admitting: Anesthesiology

## 2021-10-12 ENCOUNTER — Encounter (HOSPITAL_COMMUNITY): Payer: Self-pay | Admitting: Cardiovascular Disease

## 2021-10-12 ENCOUNTER — Encounter (HOSPITAL_COMMUNITY)
Admission: AD | Disposition: A | Discharge status: Home Health | Payer: Self-pay | Source: Home / Self Care | Attending: Cardiology

## 2021-10-12 DIAGNOSIS — I4819 Other persistent atrial fibrillation: Secondary | ICD-10-CM | POA: Diagnosis not present

## 2021-10-12 DIAGNOSIS — Z23 Encounter for immunization: Secondary | ICD-10-CM | POA: Diagnosis not present

## 2021-10-12 DIAGNOSIS — K259 Gastric ulcer, unspecified as acute or chronic, without hemorrhage or perforation: Secondary | ICD-10-CM

## 2021-10-12 DIAGNOSIS — I5033 Acute on chronic diastolic (congestive) heart failure: Secondary | ICD-10-CM | POA: Diagnosis not present

## 2021-10-12 DIAGNOSIS — Z006 Encounter for examination for normal comparison and control in clinical research program: Secondary | ICD-10-CM | POA: Diagnosis not present

## 2021-10-12 HISTORY — PX: ESOPHAGOGASTRODUODENOSCOPY (EGD) WITH PROPOFOL: SHX5813

## 2021-10-12 LAB — CBC WITH DIFFERENTIAL/PLATELET
Abs Immature Granulocytes: 0.04 K/uL (ref 0.00–0.07)
Basophils Absolute: 0 K/uL (ref 0.0–0.1)
Basophils Relative: 0 %
Eosinophils Absolute: 0.3 K/uL (ref 0.0–0.5)
Eosinophils Relative: 4 %
HCT: 29.2 % — ABNORMAL LOW (ref 36.0–46.0)
Hemoglobin: 9.1 g/dL — ABNORMAL LOW (ref 12.0–15.0)
Immature Granulocytes: 1 %
Lymphocytes Relative: 14 %
Lymphs Abs: 1.2 K/uL (ref 0.7–4.0)
MCH: 27.5 pg (ref 26.0–34.0)
MCHC: 31.2 g/dL (ref 30.0–36.0)
MCV: 88.2 fL (ref 80.0–100.0)
Monocytes Absolute: 1.2 K/uL — ABNORMAL HIGH (ref 0.1–1.0)
Monocytes Relative: 14 %
Neutro Abs: 5.7 K/uL (ref 1.7–7.7)
Neutrophils Relative %: 67 %
Platelets: 240 K/uL (ref 150–400)
RBC: 3.31 MIL/uL — ABNORMAL LOW (ref 3.87–5.11)
RDW: 17.1 % — ABNORMAL HIGH (ref 11.5–15.5)
WBC: 8.5 K/uL (ref 4.0–10.5)
nRBC: 0 % (ref 0.0–0.2)

## 2021-10-12 LAB — TYPE AND SCREEN
ABO/RH(D): A POS
Antibody Screen: NEGATIVE
Unit division: 0
Unit division: 0
Unit division: 0

## 2021-10-12 LAB — BPAM RBC
Blood Product Expiration Date: 202212242359
Blood Product Expiration Date: 202212242359
Blood Product Expiration Date: 202212262359
ISSUE DATE / TIME: 202212072206
ISSUE DATE / TIME: 202212080117
ISSUE DATE / TIME: 202212081532
Unit Type and Rh: 6200
Unit Type and Rh: 6200
Unit Type and Rh: 6200

## 2021-10-12 LAB — BASIC METABOLIC PANEL
Anion gap: 8 (ref 5–15)
BUN: 14 mg/dL (ref 8–23)
CO2: 29 mmol/L (ref 22–32)
Calcium: 8.5 mg/dL — ABNORMAL LOW (ref 8.9–10.3)
Chloride: 98 mmol/L (ref 98–111)
Creatinine, Ser: 0.91 mg/dL (ref 0.44–1.00)
GFR, Estimated: >60 mL/min (ref >60)
Glucose, Bld: 118 mg/dL — ABNORMAL HIGH (ref 70–99)
Potassium: 3.5 mmol/L (ref 3.5–5.1)
Sodium: 135 mmol/L (ref 135–145)

## 2021-10-12 SURGERY — ESOPHAGOGASTRODUODENOSCOPY (EGD) WITH PROPOFOL
Anesthesia: Monitor Anesthesia Care

## 2021-10-12 MED ORDER — PANTOPRAZOLE SODIUM 40 MG PO TBEC
40.0000 mg | DELAYED_RELEASE_TABLET | Freq: Two times a day (BID) | ORAL | Status: DC
  Administered 2021-10-12 – 2021-10-17 (×12): 40 mg via ORAL
  Filled 2021-10-12 (×12): qty 1

## 2021-10-12 MED ORDER — SODIUM CHLORIDE 0.9 % IV SOLN
125.0000 mg | Freq: Once | INTRAVENOUS | Status: AC
  Administered 2021-10-12: 125 mg via INTRAVENOUS
  Filled 2021-10-12: qty 10

## 2021-10-12 MED ORDER — FUROSEMIDE 20 MG PO TABS
ORAL_TABLET | ORAL | 3 refills | Status: DC
Start: 2021-10-11 — End: 2021-10-17

## 2021-10-12 MED ORDER — LIDOCAINE 2% (20 MG/ML) 5 ML SYRINGE
INTRAMUSCULAR | Status: DC | PRN
  Administered 2021-10-12: 100 mg via INTRAVENOUS

## 2021-10-12 MED ORDER — LACTATED RINGERS IV SOLN
INTRAVENOUS | Status: AC | PRN
  Administered 2021-10-12: 1000 mL via INTRAVENOUS

## 2021-10-12 MED ORDER — PROPOFOL 500 MG/50ML IV EMUL
INTRAVENOUS | Status: DC | PRN
  Administered 2021-10-12: 100 ug/kg/min via INTRAVENOUS

## 2021-10-12 MED ORDER — PROPOFOL 10 MG/ML IV BOLUS
INTRAVENOUS | Status: DC | PRN
  Administered 2021-10-12: 30 mg via INTRAVENOUS

## 2021-10-12 MED ORDER — POTASSIUM CHLORIDE ER 10 MEQ PO TBCR
EXTENDED_RELEASE_TABLET | ORAL | 3 refills | Status: DC
Start: 2021-10-11 — End: 2021-10-15

## 2021-10-12 SURGICAL SUPPLY — 15 items

## 2021-10-12 NOTE — Op Note (Addendum)
[facility] Patient Name: Brittney Walker Procedure Date : 10/12/2021 MRN: [id number] Attending MD: Estill Cotta. Loletha Carrow , MD Date of Birth: 1951-10-18 CSN: [id number] Age: 70 Admit Type: Inpatient Procedure:                Upper GI endoscopy Indications:              Unexplained iron deficiency anemia                           Recent 3-4 Gm Hgb drop, no reported bleeding.                            Discovered on peri-op labs for A fib cardioversion                            (performed 2 days ago). Patient on Eliquis that                            cannot be stopped for at least 4 weeks - procedure                            done inpatient without [place] interruption Providers:                Mallie Mussel L. Loletha Carrow, MD, Dulcy Fanny, Benetta Spar, Technician Referring MD:             Arthor Captain Medicines:                Monitored Anesthesia Care Complications:            No immediate complications. Estimated Blood Loss:     Estimated blood loss: none. Procedure:                Pre-Anesthesia Assessment:                           - Prior to the procedure, a History and Physical                            was performed, and patient medications and                            allergies were reviewed. The patient's tolerance of                            previous anesthesia was also reviewed. The risks                            and benefits of the procedure and the sedation                            options and risks were discussed with the patient.  All questions were answered, and informed consent                            was obtained. Prior Anticoagulants: The patient has                            taken Eliquis (apixaban), last dose was 1 day prior                            to procedure (evening prior). ASA Grade Assessment:                            IV - A patient with severe systemic disease that is                             a constant threat to life. After reviewing the                            risks and benefits, the patient was deemed in                            satisfactory condition to undergo the procedure.                           After obtaining informed consent, the endoscope was                            passed under direct vision. Throughout the                            procedure, the patient's blood pressure, pulse, and                            oxygen saturations were monitored continuously. The                            [id number]-H190 ([id number]) Olympus endoscope was introduced                            through the mouth, and advanced to the second part                            of duodenum. The upper GI endoscopy was                            accomplished without difficulty. The patient                            tolerated the procedure fairly well. Scope In: Scope Out: Findings:      The esophagus was normal.      One non-bleeding cratered gastric ulcer with no stigmata of bleeding was       found at the incisura. The lesion was 12-15 mm in largest dimension.  No       active bleeding upon scope insertion. Brief, self-limited oozing from       adjacent friable mucosa after lavage.      The exam of the stomach was otherwise normal except for generalized       mucosal edema.      The cardia and gastric fundus were normal on retroflexion.      The examined duodenum was normal. Impression:               - Normal esophagus.                           - Non-bleeding gastric ulcer with no stigmata of                            bleeding.                           - Normal examined duodenum.                           - No specimens collected. Recommendation:           - Return patient to hospital ward for ongoing care.                           - Low sodium diet.                           - Use Protonix (pantoprazole) 40 mg PO BID.                           - Recommend a dose of IV  iron (even though patient                            has been transfused)                           Serum H pylori IgG antibody (ordered for tomorrow                            AM)                           CBC tomorrow AM                           Outpatient GI follow up will be arranged when                            patient ready for discharge. Will need repeat EGD                            and a colonoscopy in about 8 weeks (patient will                            need to be off [place] for 24-36 hrs with improved  control of volume overload for those exams). Procedure Code(s):        --- Professional ---                           [phone], Esophagogastroduodenoscopy, flexible,                            transoral; diagnostic, including collection of                            specimen(s) by brushing or washing, when performed                            (separate procedure) Diagnosis Code(s):        --- Professional ---                           K25.9, Gastric ulcer, unspecified as acute or                            chronic, without hemorrhage or perforation                           D50.9, Iron deficiency anemia, unspecified CPT copyright 2019 American Medical Association. All rights reserved. The codes documented in this report are preliminary and upon coder review may  be revised to meet current compliance requirements. Cliford Sequeira L. Loletha Carrow, MD 10/12/2021 11:17:40 AM This report has been signed electronically. Number of Addenda: 0

## 2021-10-12 NOTE — Discharge Instructions (Signed)

## 2021-10-12 NOTE — Progress Notes (Signed)
   10/12/21 1200  Clinical Encounter Type  Visited With Patient  Visit Type Initial  Referral From Nurse  Consult/Referral To Chaplain   Chaplain Jorene Guest responded to the consult request for an Advance Directive; however the patient was not in her room. Chaplain Jorene Guest called the patient at noon and provide A.D. education and the patient declined. This note was prepared by Jeanine Luz, M.Div..  For questions please contact by phone [phone].

## 2021-10-12 NOTE — Progress Notes (Signed)
Spoke with pt at bedside regarding her insurance. Patient currently only has Medicare part A coverage. Per patient she has signed up for a Medicare Advantage plan for next year with [facility] - that will include part B and D coverage. Plan will start Jan. 1, 2023.

## 2021-10-12 NOTE — Transfer of Care (Signed)
Immediate Anesthesia Transfer of Care Note  Patient: Brittney Walker  Procedure(s) Performed: ESOPHAGOGASTRODUODENOSCOPY (EGD) WITH PROPOFOL  Patient Location: PACU  Anesthesia Type:MAC  Level of Consciousness: drowsy and patient cooperative  Airway & Oxygen Therapy: Patient Spontanous Breathing and Patient connected to nasal cannula oxygen  Post-op Assessment: Report given to RN and Post -op Vital signs reviewed and stable  Post vital signs: Reviewed and stable  Last Vitals:  Vitals Value Taken Time  BP 119/63 10/12/21 1105  Temp 36.7 C 10/12/21 1105  Pulse 71 10/12/21 1107  Resp 17 10/12/21 1107  SpO2 94 % 10/12/21 1107  Vitals shown include unvalidated device data.  Last Pain:  Vitals:   10/12/21 0929  TempSrc: Temporal  PainSc: 0-No pain         Complications: No notable events documented.

## 2021-10-12 NOTE — Interval H&P Note (Signed)
History and Physical Interval Note:  10/12/2021 10:43 AM  Brittney Walker  has presented today for surgery, with the diagnosis of Anemia.  The various methods of treatment have been discussed with the patient and family. After consideration of risks, benefits and other options for treatment, the patient has consented to  Procedure(s): ESOPHAGOGASTRODUODENOSCOPY (EGD) WITH PROPOFOL (N/A) as a surgical intervention.  The patient's history has been reviewed, patient examined, no change in status, stable for surgery.  I have reviewed the patient's chart and labs.  Questions were answered to the patient's satisfaction.    Patient breathing comfortably on minimal supplemental oxygen.  Hgb 9.1 today.  On Eliquis.  Proceed with EGD Nelida Meuse III

## 2021-10-12 NOTE — CV Procedure (Signed)
LOOP RECORDER IMPLANT   Procedure report  Procedure performed:  Loop recorder implantation   Reason for procedure:  1. ALLEVIATE HF (clinical trial) Procedure performed by:  Sanda Klein, MD  Complications:  None  Estimated blood loss:  <5 mL  Medications administered during procedure:  Lidocaine 1% with 1/100,000 epinephrine 10 mL locally Device details:  Medtronic Reveal Linq model number [id number] Procedure details:  After the risks and benefits of the procedure were discussed the patient provided informed consent. The patient was prepped and draped in usual sterile fashion. Local anesthesia was administered to an area 2 cm to the left of the sternum in the 4th intercostal space. A cutaneous incision was made using the incision tool. The introducer was then used to create a subcutaneous tunnel and carefully deploy the device. Local pressure was held to ensure hemostasis.  The incision was closed with SteriStrips and a sterile dressing was applied.  R waves 0.23 mV  Sanda Klein, MD, [facility] [phone] office [phone] pager 10/12/2021 10:23 AM

## 2021-10-12 NOTE — Progress Notes (Signed)
Patient had persistent cough tonight and requested something to help it. RN called on call cardiologist and received n/o for Robitussin and Tessalon pearls. Both administered with effective results. Breath sounds are very diminished bilaterally. Patient currently sleeping in no acute distress. Fuller Canada, RN

## 2021-10-12 NOTE — Addendum Note (Signed)
Addended by: Rubie Maid B on: 10/12/2021 08:52 AM   Modules accepted: Orders

## 2021-10-12 NOTE — Anesthesia Preprocedure Evaluation (Signed)
Anesthesia Evaluation  Patient identified by MRN, date of birth, ID band Patient awake    Reviewed: Allergy & Precautions, NPO status , Patient's Chart, lab work & pertinent test results  Airway Mallampati: II  TM Distance: >3 FB     Dental   Pulmonary neg pulmonary ROS,    breath sounds clear to auscultation       Cardiovascular +CHF   Rhythm:Regular Rate:Normal     Neuro/Psych negative neurological ROS     GI/Hepatic negative GI ROS, Neg liver ROS,   Endo/Other  Hypothyroidism   Renal/GU negative Renal ROS     Musculoskeletal   Abdominal   Peds  Hematology  (+) anemia ,   Anesthesia Other Findings   Reproductive/Obstetrics                             Anesthesia Physical  Anesthesia Plan  ASA: 3  Anesthesia Plan: MAC   Post-op Pain Management: Minimal or no pain anticipated   Induction: Intravenous  PONV Risk Score and Plan: 2 and Treatment may vary due to age or medical condition, Propofol infusion and Ondansetron  Airway Management Planned: Simple Face Mask and Natural Airway  Additional Equipment:   Intra-op Plan:   Post-operative Plan:   Informed Consent: I have reviewed the patients History and Physical, chart, labs and discussed the procedure including the risks, benefits and alternatives for the proposed anesthesia with the patient or authorized representative who has indicated his/her understanding and acceptance.       Plan Discussed with: CRNA and Anesthesiologist  Anesthesia Plan Comments:         Anesthesia Quick Evaluation

## 2021-10-12 NOTE — Progress Notes (Signed)
Progress Note  Patient Name: Brittney Walker Date of Encounter: 10/12/2021  Primary Cardiologist: None   Subjective   Patient seen and examined at her bedside. She was lying in bed when I arrived.she is status post endoscopy today with Dr. Loletha Carrow.   Inpatient Medications    Scheduled Meds:  apixaban  5 mg Oral BID   ferrous sulfate  325 mg Oral BID WC   furosemide  80 mg Intravenous BID   pantoprazole  40 mg Oral BID AC   potassium chloride SA  20 mEq Oral Daily   sodium chloride flush  3 mL Intravenous Q12H   vitamin B-12  1,000 mcg Oral Daily   Continuous Infusions:  sodium chloride     ferric gluconate (FERRLECIT) IVPB     PRN Meds: sodium chloride, acetaminophen, benzonatate, guaiFENesin, ondansetron (ZOFRAN) IV, sodium chloride flush   Vital Signs    Vitals:   10/12/21 0929 10/12/21 1105 10/12/21 1120 10/12/21 1140  BP: (!) 124/59 119/63 135/69 132/74  Pulse: 75 75 82 77  Resp: (!) 21 13 20 19   Temp: (!) 97.3 F (36.3 C) 98.1 F (36.7 C) 98.1 F (36.7 C) (!) 97.5 F (36.4 C)  TempSrc: Temporal   Oral  SpO2: 100% 92% 95% 97%  Weight: 117 kg     Height: 5\' 6"  (1.676 m)       Intake/Output Summary (Last 24 hours) at 10/12/2021 1319 Last data filed at 10/12/2021 1102 Gross per 24 hour  Intake 1421 ml  Output 2000 ml  Net -579 ml   Filed Weights   10/11/21 0416 10/12/21 0406 10/12/21 0929  Weight: 119 kg 117 kg 117 kg    Telemetry    Sinus rhythm - Personally Reviewed  ECG    None today - Personally Reviewed  Physical Exam   GEN: No acute distress.   Neck: No JVD Cardiac: ZYS,0/6 holosystolic murmurs, rubs, or gallops.  Respiratory: Clear to auscultation bilaterally. GI: Soft, nontender, non-distended  MS: +2 bilateral pitting edema; No deformity. Neuro:  Nonfocal  Psych: Normal affect   Labs    Chemistry Recent Labs  Lab 10/10/21 1014 10/11/21 0626 10/12/21 0058  NA 135 137 135  K 3.9 3.7 3.5  CL 100 100 98  CO2 24 28 29    GLUCOSE 85 105* 118*  BUN 16 14 14   CREATININE 0.92 0.83 0.91  CALCIUM 8.7* 8.6* 8.5*  GFRNONAA >60 >60 >60  ANIONGAP 11 9 8      Hematology Recent Labs  Lab 10/10/21 1258 10/11/21 0626 10/12/21 0058  WBC 6.0 5.6 8.5  RBC 2.45*  2.47* 2.80* 3.31*  HGB 6.5* 7.7* 9.1*  HCT 21.7* 24.6* 29.2*  MCV 88.6 87.9 88.2  MCH 26.5 27.5 27.5  MCHC 30.0 31.3 31.2  RDW 17.8* 17.0* 17.1*  PLT 244 234 240    Cardiac EnzymesNo results for input(s): TROPONINI in the last 168 hours. No results for input(s): TROPIPOC in the last 168 hours.   BNP Recent Labs  Lab 10/11/21 0626  BNP 460.6*     DDimer No results for input(s): DDIMER in the last 168 hours.   Radiology    DG Chest 2 View  Result Date: 10/11/2021 CLINICAL DATA:  Difficulty breathing EXAM: CHEST - 2 VIEW COMPARISON:  Previous studies including the examination of 09/03/2021 FINDINGS: Transverse diameter of heart is increased. There are no signs of alveolar pulmonary edema. There is poor inspiration. There is blunting of lateral CP angles. There is no pneumothorax.  Patchy infiltrates are seen in the right lower lung fields. There is interval improvement in aeration in the right parahilar region. IMPRESSION: Cardiomegaly. There are no signs of pulmonary edema. Increased density in the right lower lung fields may suggest pleural effusion and possibly underlying atelectasis. Electronically Signed   By: Elmer Picker M.D.   On: 10/11/2021 08:48   ECHOCARDIOGRAM COMPLETE  Result Date: 10/11/2021    ECHOCARDIOGRAM REPORT   Patient Name:   Brittney Walker Date of Exam: 10/11/2021 Medical Rec #:  [id number]        Height:       66.0 in Accession #:    [id number]       Weight:       262.3 lb Date of Birth:  08-Sep-1951        BSA:          2.244 m Patient Age:    20 years         BP:           141/68 mmHg Patient Gender: F                HR:           74 bpm. Exam Location:  Inpatient Procedure: 2D Echo, Cardiac Doppler and Color Doppler  Indications:    Dyspnea  History:        Patient has no prior history of Echocardiogram examinations.                 CHF; Arrythmias:Atrial Fibrillation.  Sonographer:    Bernadene Person RDCS Referring Phys: [id number] CHRISTOPHER RONALD [place]  1. The RV is severely enlarged with mildly-to-moderately reduced systolic function (TAPSE 1.5, S' 8.4 cm/s). There is flattening of the interventricular septum in systole and diastole, consistent with RV pressure and volume overload. IVC not well visualized. PASP is 37mmHg + RAP consistent with at least moderate (more likely severe) pulmonary hypertension.  2. Left ventricular ejection fraction, by estimation, is 60 to 65%. The left ventricle has normal function. The left ventricle has no regional wall motion abnormalities. There is mild concentric left ventricular hypertrophy. Left ventricular diastolic parameters were normal. There is the interventricular septum is flattened in systole and diastole, consistent with right ventricular pressure and volume overload.  3. Left atrial size was mildly dilated.  4. Right atrial size was severely dilated.  5. The mitral valve is normal in structure. Mild to moderate mitral valve regurgitation.  6. Tricuspid valve regurgitation is severe.  7. The aortic valve is calcified. There is mild thickening of the aortic valve. Aortic valve regurgitation is not visualized. Aortic valve sclerosis is present, with no evidence of aortic valve stenosis. Comparison(s): No prior Echocardiogram. FINDINGS  Left Ventricle: Left ventricular ejection fraction, by estimation, is 60 to 65%. The left ventricle has normal function. The left ventricle has no regional wall motion abnormalities. The left ventricular internal cavity size was normal in size. There is  mild concentric left ventricular hypertrophy. The interventricular septum is flattened in systole and diastole, consistent with right ventricular pressure and volume overload. Left ventricular  diastolic parameters were normal. Right Ventricle: The right ventricular size is severely enlarged. Right vetricular wall thickness was not well visualized. Right ventricular systolic function is mildly reduced. There is moderately elevated pulmonary artery systolic pressure. The tricuspid regurgitant velocity is 3.35 m/s, and with an assumed right atrial pressure of 8 mmHg, the estimated right ventricular systolic pressure is 14.4 mmHg. Left Atrium: Left  atrial size was mildly dilated. Right Atrium: Right atrial size was severely dilated. Pericardium: There is no evidence of pericardial effusion. Mitral Valve: The mitral valve is normal in structure. Mild mitral annular calcification. Mild to moderate mitral valve regurgitation. Tricuspid Valve: The tricuspid valve is normal in structure. Tricuspid valve regurgitation is severe. Aortic Valve: The aortic valve is calcified. There is mild thickening of the aortic valve. Aortic valve regurgitation is not visualized. Aortic valve sclerosis is present, with no evidence of aortic valve stenosis. Pulmonic Valve: The pulmonic valve was not well visualized. Pulmonic valve regurgitation is trivial. Aorta: The aortic root and ascending aorta are structurally normal, with no evidence of dilitation. Venous: The inferior vena cava was not well visualized. IAS/Shunts: There is left bowing of the interatrial septum, suggestive of elevated right atrial pressure. No atrial level shunt detected by color flow Doppler.  LEFT VENTRICLE PLAX 2D LVIDd:         4.90 cm   Diastology LVIDs:         2.90 cm   LV e' medial:    8.51 cm/s LV PW:         1.20 cm   LV E/e' medial:  14.2 LV IVS:        1.20 cm   LV e' lateral:   10.60 cm/s LVOT diam:     2.00 cm   LV E/e' lateral: 11.4 LV SV:         65 LV SV Index:   29 LVOT Area:     3.14 cm  RIGHT VENTRICLE RV S prime:     8.35 cm/s TAPSE (M-mode): 1.5 cm LEFT ATRIUM             Index        RIGHT ATRIUM           Index LA diam:        4.20 cm  1.87 cm/m   RA Area:     27.20 cm LA Vol (A2C):   96.9 ml 43.18 ml/m  RA Volume:   99.00 ml  44.11 ml/m LA Vol (A4C):   86.9 ml 38.72 ml/m LA Biplane Vol: 93.4 ml 41.62 ml/m  AORTIC VALVE LVOT Vmax:   114.00 cm/s LVOT Vmean:  71.300 cm/s LVOT VTI:    0.207 m  AORTA Ao Root diam: 3.10 cm Ao Asc diam:  3.30 cm MITRAL VALVE                  TRICUSPID VALVE MV Area (PHT): 5.13 cm       TR Peak grad:   44.9 mmHg MV Decel Time: 148 msec       TR Vmax:        335.00 cm/s MR Peak grad:    101.6 mmHg MR Mean grad:    72.0 mmHg    SHUNTS MR Vmax:         504.00 cm/s  Systemic VTI:  0.21 m MR Vmean:        408.0 cm/s   Systemic Diam: 2.00 cm MR PISA:         1.01 cm MR PISA Eff ROA: 8 mm MR PISA Radius:  0.40 cm MV E velocity: 121.00 cm/s MV A velocity: 44.70 cm/s MV E/A ratio:  2.71 Gwyndolyn Kaufman MD Electronically signed by Gwyndolyn Kaufman MD Signature Date/Time: 10/11/2021/12:14:21 PM    Final     Cardiac Studies   TTE 10/11/2021 IMPRESSIONS   1. The RV is  severely enlarged with mildly-to-moderately reduced systolic function (TAPSE 1.5, S' 8.4 cm/s). There is flattening of the interventricular septum in systole and diastole, consistent with RV  pressure and volume overload. IVC not well visualized. PASP is 83mmHg + RAP consistent with at least moderate (more  likely severe) pulmonary hypertension.   2. Left ventricular ejection fraction, by estimation, is 60 to 65%. The  left ventricle has normal function. The left ventricle has no regional  wall motion abnormalities. There is mild concentric left ventricular  hypertrophy. Left ventricular diastolic  parameters were normal. There is the interventricular septum is flattened  in systole and diastole, consistent with right ventricular pressure and  volume overload.   3. Left atrial size was mildly dilated.   4. Right atrial size was severely dilated.   5. The mitral valve is normal in structure. Mild to moderate mitral valve  regurgitation.   6.  Tricuspid valve regurgitation is severe.   7. The aortic valve is calcified. There is mild thickening of the aortic  valve. Aortic valve regurgitation is not visualized. Aortic valve  sclerosis is present, with no evidence of aortic valve stenosis.   Comparison(s): No prior Echocardiogram.   FINDINGS   Left Ventricle: Left ventricular ejection fraction, by estimation, is 60  to 65%. The left ventricle has normal function. The left ventricle has no  regional wall motion abnormalities. The left ventricular internal cavity  size was normal in size. There is   mild concentric left ventricular hypertrophy. The interventricular septum  is flattened in systole and diastole, consistent with right ventricular  pressure and volume overload. Left ventricular diastolic parameters were  normal.   Right Ventricle: The right ventricular size is severely enlarged. Right  vetricular wall thickness was not well visualized. Right ventricular  systolic function is mildly reduced. There is moderately elevated  pulmonary artery systolic pressure. The  tricuspid regurgitant velocity is 3.35 m/s, and with an assumed right  atrial pressure of 8 mmHg, the estimated right ventricular systolic  pressure is 84.1 mmHg.   Left Atrium: Left atrial size was mildly dilated.   Right Atrium: Right atrial size was severely dilated.   Pericardium: There is no evidence of pericardial effusion.   Mitral Valve: The mitral valve is normal in structure. Mild mitral annular  calcification. Mild to moderate mitral valve regurgitation.   Tricuspid Valve: The tricuspid valve is normal in structure. Tricuspid  valve regurgitation is severe.   Aortic Valve: The aortic valve is calcified. There is mild thickening of  the aortic valve. Aortic valve regurgitation is not visualized. Aortic  valve sclerosis is present, with no evidence of aortic valve stenosis.   Pulmonic Valve: The pulmonic valve was not well visualized.  Pulmonic valve  regurgitation is trivial.   Aorta: The aortic root and ascending aorta are structurally normal, with  no evidence of dilitation.   Venous: The inferior vena cava was not well visualized.   IAS/Shunts: There is left bowing of the interatrial septum, suggestive of  elevated right atrial pressure. No atrial level shunt detected by color  flow Doppler.   Patient Profile     70 y.o. female with history of persistent atrial fibrillation status post cardioversion recently, now with symptomatic anemia on Eliquis, hypothyroidism status post radioactive iodine and obesity.  Assessment & Plan    Paroxysmal atrial fibrillation status post cardioversion, now in sinus rhythm -status post cardioversion recently still in sinus rhythm, continue Eliquis.  Has not required AV nodal blocking agent.  With heart rate being controlled.  Ideally we plan to continue her anticoagulation for 4 weeks as she has recently been cardioverted.  Source of atrial fibrillation may be multifactorial including sleep apnea which she will need outpatient sleep study.  Acute exacerbation of Diastolic heart failure -multifactorial given with tricuspid regurgitation with moderate pulmonary hypertension, mild to moderate mitral regurgitation.  Unfortunately her for last 24 hours she has not had significant output we will increase her Lasix to 120 mg twice daily hopefully we can get some and if no improvement may benefit from adding metolazone.  Moderate Pulmonary Hypertension-suspected likely secondary to her severe tricuspid regurgitation as well as potential sleep apnea.  For now we will diurese the patient and may eventually need a right heart catheterization which does not necessarily need to be in the inpatient setting and a complete work-up with sideration of treatment likely to benefit from phosphodiesterase inhibitors.  Severe Tricuspid Regurgitation, mild-moderate mitral regurgitation we will diurese and will  possibly need to reassess with a limited echo prior to discharge.  Etiology of her severe tricuspid regurgitation but does not appear to be primary valvular dysfunction but likely functional due to her pulmonary hypertension.  Symptomatic anemia s/p ECG with noted evidence of gastric ulcer.  We will continue PPIs with Protonix 40 mg twice daily.  Appreciate GI evaluating the patient in performing endoscopy.  Labs for H. pylori pending.  Morbid obesity-outpatient referral to healthy weight and wellness.  DVT prophylaxis-on Eliquis Full code Length of stay-2 days  For questions or updates, please contact [place] Please consult [email or web address] for contact info under Cardiology/STEMI.      Signed, Nyjae Hodge, DO  10/12/2021, 1:19 PM

## 2021-10-13 DIAGNOSIS — D500 Iron deficiency anemia secondary to blood loss (chronic): Secondary | ICD-10-CM

## 2021-10-13 DIAGNOSIS — I5030 Unspecified diastolic (congestive) heart failure: Secondary | ICD-10-CM

## 2021-10-13 LAB — BASIC METABOLIC PANEL
Anion gap: 8 (ref 5–15)
BUN: 10 mg/dL (ref 8–23)
CO2: 30 mmol/L (ref 22–32)
Calcium: 8.4 mg/dL — ABNORMAL LOW (ref 8.9–10.3)
Chloride: 98 mmol/L (ref 98–111)
Creatinine, Ser: 0.92 mg/dL (ref 0.44–1.00)
GFR, Estimated: >60 mL/min (ref >60)
Glucose, Bld: 97 mg/dL (ref 70–99)
Potassium: 3.5 mmol/L (ref 3.5–5.1)
Sodium: 136 mmol/L (ref 135–145)

## 2021-10-13 LAB — CBC WITH DIFFERENTIAL/PLATELET
Abs Immature Granulocytes: 0.02 10*3/uL (ref 0.00–0.07)
Basophils Absolute: 0 10*3/uL (ref 0.0–0.1)
Basophils Relative: 1 %
Eosinophils Absolute: 0.3 10*3/uL (ref 0.0–0.5)
Eosinophils Relative: 5 %
HCT: 30.7 % — ABNORMAL LOW (ref 36.0–46.0)
Hemoglobin: 9.5 g/dL — ABNORMAL LOW (ref 12.0–15.0)
Immature Granulocytes: 0 %
Lymphocytes Relative: 19 %
Lymphs Abs: 1.1 10*3/uL (ref 0.7–4.0)
MCH: 27.9 pg (ref 26.0–34.0)
MCHC: 30.9 g/dL (ref 30.0–36.0)
MCV: 90.3 fL (ref 80.0–100.0)
Monocytes Absolute: 1 10*3/uL (ref 0.1–1.0)
Monocytes Relative: 17 %
Neutro Abs: 3.6 10*3/uL (ref 1.7–7.7)
Neutrophils Relative %: 58 %
Platelets: 211 10*3/uL (ref 150–400)
RBC: 3.4 MIL/uL — ABNORMAL LOW (ref 3.87–5.11)
RDW: 17.7 % — ABNORMAL HIGH (ref 11.5–15.5)
WBC: 6.1 10*3/uL (ref 4.0–10.5)
nRBC: 0 % (ref 0.0–0.2)

## 2021-10-13 NOTE — Progress Notes (Addendum)
     [facility] Progress Note  CC: Iron deficiency anemia  Subjective:  Feels good other than still retaining a lot of fluid in her legs.  No GI complaints.  Objective:  Vital signs in last 24 hours: Temp:  [97.5 F (36.4 C)-98.8 F (37.1 C)] 98.5 F (36.9 C) (12/10 0842) Pulse Rate:  [69-79] 73 (12/10 0842) Resp:  [19-27] 20 (12/10 0842) BP: (105-134)/(58-74) 134/69 (12/10 0842) SpO2:  [95 %-100 %] 96 % (12/10 0842) Weight:  [113.9 kg] 113.9 kg (12/10 0422) Last BM Date: 10/11/21 General:  Alert, Well-developed, in NAD Heart:  Regular rate and rhythm; no murmurs Pulm:  CTAB.  No W/R/R. Abdomen:  Soft, non-distended.  BS present.  Non-tender.  Extremities:  3+ pitting edema B/L Neurologic:  Alert and oriented x 4;  grossly normal neurologically. Psych:  Alert and cooperative. Normal mood and affect.  Intake/Output from previous day: 12/09 0701 - 12/10 0700 In: 1322.4 [P.O.:840; I.V.:347; IV Piggyback:135.4] Out: 6150 [Urine:6150] Intake/Output this shift: Total I/O In: 240 [P.O.:240] Out: 700 [Urine:700]  Lab Results: Recent Labs    10/11/21 0626 10/12/21 0058 10/13/21 0202  WBC 5.6 8.5 6.1  HGB 7.7* 9.1* 9.5*  HCT 24.6* 29.2* 30.7*  PLT 234 240 211   BMET Recent Labs    10/11/21 0626 10/12/21 0058 10/13/21 0202  NA 137 135 136  K 3.7 3.5 3.5  CL 100 98 98  CO2 28 29 30   GLUCOSE 105* 118* 97  BUN 14 14 10   CREATININE 0.83 0.91 0.92  CALCIUM 8.6* 8.5* 8.4*    Assessment / Plan: 1.  Acute on chronic anemia: 06/24/2017 hemoglobin 12.9 --> 07/17/2021 hemoglobin 11 --> 09/03/2021 hemoglobin 10.7 --> 09/12/2021 hemoglobin  10.3--> 12/7 hemoglobin 6.8--> 6.5--> 2 unit PRBCs--> 7.7--> another unit PRBCs ordered, iron 15, TIBC 525, ferritin 18, fecal occult negative; consider GI source.  EGD on 12/9 6 showed nonbleeding gastric ulcer with no stigmata of bleeding.  H. pylori antibody is pending.  Received IV iron infusion.  Hemoglobin remained stable  this morning at 9.5 g. 2.  Acute CHF: With lower extremity and abdominal edema.  Being managed by cards. 3.  A. fib: Status post cardioversion 10/10/2021, on Eliquis.  -Continue to monitor hemoglobin while she is here.  GI is signing off.  Our office will arrange for outpatient follow-up at which time we can discuss repeat EGD and colonoscopy.  Should remain on pantoprazole 40 mg twice daily for now.   LOS: 3 days   Laban Emperor. Zehr  10/13/2021, 11:22 AM  I have discussed the case with the APP, and that is the plan I formulated. I personally interviewed and examined the patient.  Additional diagnosis: Gastric ulcer, benign appearing.  Cannot be biopsied due to continued use of oral anticoagulation.  No active bleeding, treatment plan as outlined in EGD note from yesterday.  We will arrange follow-up with this patient, okay for home GI perspective after diuresis per cardiology service.  Thank you for involving [place] in the care of this delightful patient.   Nelida Meuse III Office: [phone]

## 2021-10-13 NOTE — Progress Notes (Signed)
Progress Note  Patient Name: Brittney Walker Date of Encounter: 10/13/2021  Primary Cardiologist: Croitoru   Subjective   Still holding too much fluid in legs   Inpatient Medications    Scheduled Meds:  apixaban  5 mg Oral BID   ferrous sulfate  325 mg Oral BID WC   furosemide  80 mg Intravenous BID   pantoprazole  40 mg Oral BID AC   potassium chloride SA  20 mEq Oral Daily   sodium chloride flush  3 mL Intravenous Q12H   vitamin B-12  1,000 mcg Oral Daily   Continuous Infusions:  sodium chloride Stopped (10/12/21 1824)   PRN Meds: sodium chloride, acetaminophen, benzonatate, guaiFENesin, ondansetron (ZOFRAN) IV, sodium chloride flush   Vital Signs    Vitals:   10/12/21 2309 10/13/21 0422 10/13/21 0720 10/13/21 0842  BP: (!) 105/59 (!) 129/58 112/60 134/69  Pulse: 78 69 79 73  Resp: 20 (!) 21 (!) 21 20  Temp: 98 F (36.7 C) 98 F (36.7 C) 98.8 F (37.1 C) 98.5 F (36.9 C)  TempSrc: Oral Oral Oral Oral  SpO2: 95% 98% 96% 96%  Weight:  113.9 kg    Height:        Intake/Output Summary (Last 24 hours) at 10/13/2021 1024 Last data filed at 10/13/2021 1018 Gross per 24 hour  Intake 1562.42 ml  Output 6850 ml  Net -5287.58 ml   Filed Weights   10/12/21 0406 10/12/21 0929 10/13/21 0422  Weight: 117 kg 117 kg 113.9 kg    Telemetry    Sinus rhythm - Personally Reviewed  ECG    None today - Personally Reviewed  Physical Exam   GEN: No acute distress.   Neck: No JVD Cardiac: RFF,6/3 holosystolic murmurs, rubs, or gallops.  Respiratory: Clear to auscultation bilaterally. GI: Soft, nontender, non-distended  Plus 3 bilateral LE edema to thighs  Neuro:  Nonfocal  Psych: Normal affect   Labs    Chemistry Recent Labs  Lab 10/11/21 0626 10/12/21 0058 10/13/21 0202  NA 137 135 136  K 3.7 3.5 3.5  CL 100 98 98  CO2 28 29 30   GLUCOSE 105* 118* 97  BUN 14 14 10   CREATININE 0.83 0.91 0.92  CALCIUM 8.6* 8.5* 8.4*  GFRNONAA >60 >60 >60   ANIONGAP 9 8 8      Hematology Recent Labs  Lab 10/11/21 0626 10/12/21 0058 10/13/21 0202  WBC 5.6 8.5 6.1  RBC 2.80* 3.31* 3.40*  HGB 7.7* 9.1* 9.5*  HCT 24.6* 29.2* 30.7*  MCV 87.9 88.2 90.3  MCH 27.5 27.5 27.9  MCHC 31.3 31.2 30.9  RDW 17.0* 17.1* 17.7*  PLT 234 240 211    Cardiac EnzymesNo results for input(s): TROPONINI in the last 168 hours. No results for input(s): TROPIPOC in the last 168 hours.   BNP Recent Labs  Lab 10/11/21 0626  BNP 460.6*     DDimer No results for input(s): DDIMER in the last 168 hours.   Radiology    ECHOCARDIOGRAM COMPLETE  Result Date: 10/11/2021    ECHOCARDIOGRAM REPORT   Patient Name:   Brittney Walker Date of Exam: 10/11/2021 Medical Rec #:  [id number]        Height:       66.0 in Accession #:    [id number]       Weight:       262.3 lb Date of Birth:  1951/03/16        BSA:  2.244 m Patient Age:    70 years         BP:           141/68 mmHg Patient Gender: F                HR:           74 bpm. Exam Location:  Inpatient Procedure: 2D Echo, Cardiac Doppler and Color Doppler Indications:    Dyspnea  History:        Patient has no prior history of Echocardiogram examinations.                 CHF; Arrythmias:Atrial Fibrillation.  Sonographer:    Bernadene Person RDCS Referring Phys: [id number] CHRISTOPHER RONALD Olivia  1. The RV is severely enlarged with mildly-to-moderately reduced systolic function (TAPSE 1.5, S' 8.4 cm/s). There is flattening of the interventricular septum in systole and diastole, consistent with RV pressure and volume overload. IVC not well visualized. PASP is 31mmHg + RAP consistent with at least moderate (more likely severe) pulmonary hypertension.  2. Left ventricular ejection fraction, by estimation, is 60 to 65%. The left ventricle has normal function. The left ventricle has no regional wall motion abnormalities. There is mild concentric left ventricular hypertrophy. Left ventricular diastolic parameters were  normal. There is the interventricular septum is flattened in systole and diastole, consistent with right ventricular pressure and volume overload.  3. Left atrial size was mildly dilated.  4. Right atrial size was severely dilated.  5. The mitral valve is normal in structure. Mild to moderate mitral valve regurgitation.  6. Tricuspid valve regurgitation is severe.  7. The aortic valve is calcified. There is mild thickening of the aortic valve. Aortic valve regurgitation is not visualized. Aortic valve sclerosis is present, with no evidence of aortic valve stenosis. Comparison(s): No prior Echocardiogram. FINDINGS  Left Ventricle: Left ventricular ejection fraction, by estimation, is 60 to 65%. The left ventricle has normal function. The left ventricle has no regional wall motion abnormalities. The left ventricular internal cavity size was normal in size. There is  mild concentric left ventricular hypertrophy. The interventricular septum is flattened in systole and diastole, consistent with right ventricular pressure and volume overload. Left ventricular diastolic parameters were normal. Right Ventricle: The right ventricular size is severely enlarged. Right vetricular wall thickness was not well visualized. Right ventricular systolic function is mildly reduced. There is moderately elevated pulmonary artery systolic pressure. The tricuspid regurgitant velocity is 3.35 m/s, and with an assumed right atrial pressure of 8 mmHg, the estimated right ventricular systolic pressure is 01.6 mmHg. Left Atrium: Left atrial size was mildly dilated. Right Atrium: Right atrial size was severely dilated. Pericardium: There is no evidence of pericardial effusion. Mitral Valve: The mitral valve is normal in structure. Mild mitral annular calcification. Mild to moderate mitral valve regurgitation. Tricuspid Valve: The tricuspid valve is normal in structure. Tricuspid valve regurgitation is severe. Aortic Valve: The aortic valve is  calcified. There is mild thickening of the aortic valve. Aortic valve regurgitation is not visualized. Aortic valve sclerosis is present, with no evidence of aortic valve stenosis. Pulmonic Valve: The pulmonic valve was not well visualized. Pulmonic valve regurgitation is trivial. Aorta: The aortic root and ascending aorta are structurally normal, with no evidence of dilitation. Venous: The inferior vena cava was not well visualized. IAS/Shunts: There is left bowing of the interatrial septum, suggestive of elevated right atrial pressure. No atrial level shunt detected by color flow Doppler.  LEFT VENTRICLE PLAX 2D LVIDd:         4.90 cm   Diastology LVIDs:         2.90 cm   LV e' medial:    8.51 cm/s LV PW:         1.20 cm   LV E/e' medial:  14.2 LV IVS:        1.20 cm   LV e' lateral:   10.60 cm/s LVOT diam:     2.00 cm   LV E/e' lateral: 11.4 LV SV:         65 LV SV Index:   29 LVOT Area:     3.14 cm  RIGHT VENTRICLE RV S prime:     8.35 cm/s TAPSE (M-mode): 1.5 cm LEFT ATRIUM             Index        RIGHT ATRIUM           Index LA diam:        4.20 cm 1.87 cm/m   RA Area:     27.20 cm LA Vol (A2C):   96.9 ml 43.18 ml/m  RA Volume:   99.00 ml  44.11 ml/m LA Vol (A4C):   86.9 ml 38.72 ml/m LA Biplane Vol: 93.4 ml 41.62 ml/m  AORTIC VALVE LVOT Vmax:   114.00 cm/s LVOT Vmean:  71.300 cm/s LVOT VTI:    0.207 m  AORTA Ao Root diam: 3.10 cm Ao Asc diam:  3.30 cm MITRAL VALVE                  TRICUSPID VALVE MV Area (PHT): 5.13 cm       TR Peak grad:   44.9 mmHg MV Decel Time: 148 msec       TR Vmax:        335.00 cm/s MR Peak grad:    101.6 mmHg MR Mean grad:    72.0 mmHg    SHUNTS MR Vmax:         504.00 cm/s  Systemic VTI:  0.21 m MR Vmean:        408.0 cm/s   Systemic Diam: 2.00 cm MR PISA:         1.01 cm MR PISA Eff ROA: 8 mm MR PISA Radius:  0.40 cm MV E velocity: 121.00 cm/s MV A velocity: 44.70 cm/s MV E/A ratio:  2.71 Gwyndolyn Kaufman MD Electronically signed by Gwyndolyn Kaufman MD Signature  Date/Time: 10/11/2021/12:14:21 PM    Final     Cardiac Studies   TTE 10/11/2021 IMPRESSIONS   1. The RV is severely enlarged with mildly-to-moderately reduced systolic function (TAPSE 1.5, S' 8.4 cm/s). There is flattening of the interventricular septum in systole and diastole, consistent with RV  pressure and volume overload. IVC not well visualized. PASP is 23mmHg + RAP consistent with at least moderate (more  likely severe) pulmonary hypertension.   2. Left ventricular ejection fraction, by estimation, is 60 to 65%. The  left ventricle has normal function. The left ventricle has no regional  wall motion abnormalities. There is mild concentric left ventricular  hypertrophy. Left ventricular diastolic  parameters were normal. There is the interventricular septum is flattened  in systole and diastole, consistent with right ventricular pressure and  volume overload.   3. Left atrial size was mildly dilated.   4. Right atrial size was severely dilated.   5. The mitral valve is normal in structure. Mild to moderate mitral valve  regurgitation.   6. Tricuspid valve regurgitation is severe.   7. The aortic valve is calcified. There is mild thickening of the aortic  valve. Aortic valve regurgitation is not visualized. Aortic valve  sclerosis is present, with no evidence of aortic valve stenosis.   Comparison(s): No prior Echocardiogram.   FINDINGS   Left Ventricle: Left ventricular ejection fraction, by estimation, is 60  to 65%. The left ventricle has normal function. The left ventricle has no  regional wall motion abnormalities. The left ventricular internal cavity  size was normal in size. There is   mild concentric left ventricular hypertrophy. The interventricular septum  is flattened in systole and diastole, consistent with right ventricular  pressure and volume overload. Left ventricular diastolic parameters were  normal.   Right Ventricle: The right ventricular size is severely  enlarged. Right  vetricular wall thickness was not well visualized. Right ventricular  systolic function is mildly reduced. There is moderately elevated  pulmonary artery systolic pressure. The  tricuspid regurgitant velocity is 3.35 m/s, and with an assumed right  atrial pressure of 8 mmHg, the estimated right ventricular systolic  pressure is 47.6 mmHg.   Left Atrium: Left atrial size was mildly dilated.   Right Atrium: Right atrial size was severely dilated.   Pericardium: There is no evidence of pericardial effusion.   Mitral Valve: The mitral valve is normal in structure. Mild mitral annular  calcification. Mild to moderate mitral valve regurgitation.   Tricuspid Valve: The tricuspid valve is normal in structure. Tricuspid  valve regurgitation is severe.   Aortic Valve: The aortic valve is calcified. There is mild thickening of  the aortic valve. Aortic valve regurgitation is not visualized. Aortic  valve sclerosis is present, with no evidence of aortic valve stenosis.   Pulmonic Valve: The pulmonic valve was not well visualized. Pulmonic valve  regurgitation is trivial.   Aorta: The aortic root and ascending aorta are structurally normal, with  no evidence of dilitation.   Venous: The inferior vena cava was not well visualized.   IAS/Shunts: There is left bowing of the interatrial septum, suggestive of  elevated right atrial pressure. No atrial level shunt detected by color  flow Doppler.   Patient Profile     70 y.o. female with history of persistent atrial fibrillation status post cardioversion recently, now with symptomatic anemia on Eliquis, hypothyroidism status post radioactive iodine and obesity.  Assessment & Plan    Paroxysmal atrial fibrillation status post cardioversion, 10/11/21 continue anticoagulation maintaining NSR   Acute exacerbation of Diastolic heart failure -multifactorial given with tricuspid regurgitation with moderate pulmonary hypertension,  mild to moderate mitral regurgitation.  Good diuresis > 5L's continue iv lasix for another 48 hours   Moderate Pulmonary Hypertension-suspected likely secondary to her severe tricuspid regurgitation as well as potential sleep apnea.  For now we will diurese the patient and may eventually need a right heart catheterization which does not necessarily need to be in the inpatient setting and a complete work-up with sideration of treatment likely to benefit from phosphodiesterase inhibitors.  Symptomatic anemia s/p ECG with noted evidence of gastric ulcer.  We will continue PPIs with Protonix 40 mg twice daily.  Appreciate GI evaluating the patient in performing endoscopy.  Hct stable 30.7 H pylori pending   Morbid obesity-outpatient referral to healthy weight and wellness.      Signed, Jenkins Rouge, MD  10/13/2021, 10:24 AM

## 2021-10-14 DIAGNOSIS — I5032 Chronic diastolic (congestive) heart failure: Secondary | ICD-10-CM

## 2021-10-14 DIAGNOSIS — I480 Paroxysmal atrial fibrillation: Secondary | ICD-10-CM

## 2021-10-14 LAB — CBC WITH DIFFERENTIAL/PLATELET
Abs Immature Granulocytes: 0.02 10*3/uL (ref 0.00–0.07)
Basophils Absolute: 0 10*3/uL (ref 0.0–0.1)
Basophils Relative: 1 %
Eosinophils Absolute: 0.3 10*3/uL (ref 0.0–0.5)
Eosinophils Relative: 6 %
HCT: 28.8 % — ABNORMAL LOW (ref 36.0–46.0)
Hemoglobin: 9 g/dL — ABNORMAL LOW (ref 12.0–15.0)
Immature Granulocytes: 0 %
Lymphocytes Relative: 20 %
Lymphs Abs: 1.1 10*3/uL (ref 0.7–4.0)
MCH: 27.8 pg (ref 26.0–34.0)
MCHC: 31.3 g/dL (ref 30.0–36.0)
MCV: 88.9 fL (ref 80.0–100.0)
Monocytes Absolute: 0.8 10*3/uL (ref 0.1–1.0)
Monocytes Relative: 15 %
Neutro Abs: 3.2 10*3/uL (ref 1.7–7.7)
Neutrophils Relative %: 58 %
Platelets: 228 10*3/uL (ref 150–400)
RBC: 3.24 MIL/uL — ABNORMAL LOW (ref 3.87–5.11)
RDW: 17.9 % — ABNORMAL HIGH (ref 11.5–15.5)
WBC: 5.5 10*3/uL (ref 4.0–10.5)
nRBC: 0 % (ref 0.0–0.2)

## 2021-10-14 LAB — BASIC METABOLIC PANEL
Anion gap: 8 (ref 5–15)
BUN: 11 mg/dL (ref 8–23)
CO2: 29 mmol/L (ref 22–32)
Calcium: 8.4 mg/dL — ABNORMAL LOW (ref 8.9–10.3)
Chloride: 97 mmol/L — ABNORMAL LOW (ref 98–111)
Creatinine, Ser: 0.88 mg/dL (ref 0.44–1.00)
GFR, Estimated: >60 mL/min (ref >60)
Glucose, Bld: 132 mg/dL — ABNORMAL HIGH (ref 70–99)
Potassium: 3.4 mmol/L — ABNORMAL LOW (ref 3.5–5.1)
Sodium: 134 mmol/L — ABNORMAL LOW (ref 135–145)

## 2021-10-14 MED ORDER — POTASSIUM CHLORIDE CRYS ER 20 MEQ PO TBCR
40.0000 meq | EXTENDED_RELEASE_TABLET | Freq: Every day | ORAL | Status: DC
  Administered 2021-10-15 – 2021-10-17 (×3): 40 meq via ORAL
  Filled 2021-10-14 (×3): qty 2

## 2021-10-14 NOTE — Progress Notes (Signed)
Progress Note  Patient Name: Brittney Walker Date of Encounter: 10/14/2021  Primary Cardiologist: Croitoru   Subjective   Still holding too much fluid in legs   Inpatient Medications    Scheduled Meds:  apixaban  5 mg Oral BID   ferrous sulfate  325 mg Oral BID WC   furosemide  80 mg Intravenous BID   pantoprazole  40 mg Oral BID AC   potassium chloride SA  20 mEq Oral Daily   sodium chloride flush  3 mL Intravenous Q12H   vitamin B-12  1,000 mcg Oral Daily   Continuous Infusions:  sodium chloride Stopped (10/12/21 1824)   PRN Meds: sodium chloride, acetaminophen, benzonatate, guaiFENesin, ondansetron (ZOFRAN) IV, sodium chloride flush   Vital Signs    Vitals:   10/13/21 1939 10/13/21 2312 10/14/21 0348 10/14/21 0847  BP: (!) 119/56 (!) 118/50 (!) 117/59 111/84  Pulse: 84 74 72 75  Resp: 20 19 19 19   Temp: 98.9 F (37.2 C) 98.7 F (37.1 C) 98.4 F (36.9 C) 98 F (36.7 C)  TempSrc: Oral Oral Oral Oral  SpO2: 100% 98% 98% 96%  Weight:   110.1 kg   Height:        Intake/Output Summary (Last 24 hours) at 10/14/2021 1020 Last data filed at 10/14/2021 0347 Gross per 24 hour  Intake 840 ml  Output 3000 ml  Net -2160 ml   Filed Weights   10/12/21 0929 10/13/21 0422 10/14/21 0348  Weight: 117 kg 113.9 kg 110.1 kg    Telemetry    Sinus rhythm - Personally Reviewed  ECG    None today - Personally Reviewed  Physical Exam   GEN: No acute distress.   Neck: No JVD Cardiac: KPV,3/7 holosystolic murmurs, rubs, or gallops.  Respiratory: Clear to auscultation bilaterally. GI: Soft, nontender, non-distended  Plus 3 bilateral LE edema to thighs tense  Neuro:  Nonfocal  Psych: Normal affect   Labs    Chemistry Recent Labs  Lab 10/12/21 0058 10/13/21 0202 10/14/21 0232  NA 135 136 134*  K 3.5 3.5 3.4*  CL 98 98 97*  CO2 29 30 29   GLUCOSE 118* 97 132*  BUN 14 10 11   CREATININE 0.91 0.92 0.88  CALCIUM 8.5* 8.4* 8.4*  GFRNONAA >60 >60 >60   ANIONGAP 8 8 8      Hematology Recent Labs  Lab 10/12/21 0058 10/13/21 0202 10/14/21 0232  WBC 8.5 6.1 5.5  RBC 3.31* 3.40* 3.24*  HGB 9.1* 9.5* 9.0*  HCT 29.2* 30.7* 28.8*  MCV 88.2 90.3 88.9  MCH 27.5 27.9 27.8  MCHC 31.2 30.9 31.3  RDW 17.1* 17.7* 17.9*  PLT 240 211 228    Cardiac EnzymesNo results for input(s): TROPONINI in the last 168 hours. No results for input(s): TROPIPOC in the last 168 hours.   BNP Recent Labs  Lab 10/11/21 0626  BNP 460.6*     DDimer No results for input(s): DDIMER in the last 168 hours.   Radiology    No results found.  Cardiac Studies   TTE 10/11/2021 IMPRESSIONS   1. The RV is severely enlarged with mildly-to-moderately reduced systolic function (TAPSE 1.5, S' 8.4 cm/s). There is flattening of the interventricular septum in systole and diastole, consistent with RV  pressure and volume overload. IVC not well visualized. PASP is 59mmHg + RAP consistent with at least moderate (more  likely severe) pulmonary hypertension.   2. Left ventricular ejection fraction, by estimation, is 60 to 65%. The  left  ventricle has normal function. The left ventricle has no regional  wall motion abnormalities. There is mild concentric left ventricular  hypertrophy. Left ventricular diastolic  parameters were normal. There is the interventricular septum is flattened  in systole and diastole, consistent with right ventricular pressure and  volume overload.   3. Left atrial size was mildly dilated.   4. Right atrial size was severely dilated.   5. The mitral valve is normal in structure. Mild to moderate mitral valve  regurgitation.   6. Tricuspid valve regurgitation is severe.   7. The aortic valve is calcified. There is mild thickening of the aortic  valve. Aortic valve regurgitation is not visualized. Aortic valve  sclerosis is present, with no evidence of aortic valve stenosis.   Comparison(s): No prior Echocardiogram.   FINDINGS   Left  Ventricle: Left ventricular ejection fraction, by estimation, is 60  to 65%. The left ventricle has normal function. The left ventricle has no  regional wall motion abnormalities. The left ventricular internal cavity  size was normal in size. There is   mild concentric left ventricular hypertrophy. The interventricular septum  is flattened in systole and diastole, consistent with right ventricular  pressure and volume overload. Left ventricular diastolic parameters were  normal.   Right Ventricle: The right ventricular size is severely enlarged. Right  vetricular wall thickness was not well visualized. Right ventricular  systolic function is mildly reduced. There is moderately elevated  pulmonary artery systolic pressure. The  tricuspid regurgitant velocity is 3.35 m/s, and with an assumed right  atrial pressure of 8 mmHg, the estimated right ventricular systolic  pressure is 48.5 mmHg.   Left Atrium: Left atrial size was mildly dilated.   Right Atrium: Right atrial size was severely dilated.   Pericardium: There is no evidence of pericardial effusion.   Mitral Valve: The mitral valve is normal in structure. Mild mitral annular  calcification. Mild to moderate mitral valve regurgitation.   Tricuspid Valve: The tricuspid valve is normal in structure. Tricuspid  valve regurgitation is severe.   Aortic Valve: The aortic valve is calcified. There is mild thickening of  the aortic valve. Aortic valve regurgitation is not visualized. Aortic  valve sclerosis is present, with no evidence of aortic valve stenosis.   Pulmonic Valve: The pulmonic valve was not well visualized. Pulmonic valve  regurgitation is trivial.   Aorta: The aortic root and ascending aorta are structurally normal, with  no evidence of dilitation.   Venous: The inferior vena cava was not well visualized.   IAS/Shunts: There is left bowing of the interatrial septum, suggestive of  elevated right atrial pressure. No  atrial level shunt detected by color  flow Doppler.   Patient Profile     70 y.o. female with history of persistent atrial fibrillation status post cardioversion recently, now with symptomatic anemia on Eliquis, hypothyroidism status post radioactive iodine and obesity.  Assessment & Plan    Paroxysmal atrial fibrillation status post cardioversion, 10/11/21 continue anticoagulation maintaining NSR   Acute exacerbation of Diastolic heart failure -multifactorial given with tricuspid regurgitation with moderate pulmonary hypertension, mild to moderate mitral regurgitation.  Good diuresis  another 2 liters yesterday but still with tense LE edema   Moderate Pulmonary Hypertension-suspected likely secondary to her severe tricuspid regurgitation as well as potential sleep apnea.  For now we will diurese the patient and may eventually need a right heart catheterization which does not necessarily need to be in the inpatient setting    Symptomatic  anemia s/p EGD  with noted evidence of gastric ulcer.  We will continue PPIs with Protonix 40 mg twice daily.  Appreciate GI evaluating the patient in performing endoscopy.  Hct stable 30.7 H pylori pending   Morbid obesity-outpatient referral to healthy weight and wellness.      Signed, Jenkins Rouge, MD  10/14/2021, 10:20 AM

## 2021-10-15 ENCOUNTER — Telehealth: Payer: Self-pay

## 2021-10-15 ENCOUNTER — Ambulatory Visit (HOSPITAL_COMMUNITY): Payer: Self-pay | Admitting: Physician Assistant

## 2021-10-15 ENCOUNTER — Encounter (HOSPITAL_COMMUNITY): Payer: Self-pay | Admitting: Gastroenterology

## 2021-10-15 LAB — CBC WITH DIFFERENTIAL/PLATELET
Abs Immature Granulocytes: 0.01 10*3/uL (ref 0.00–0.07)
Basophils Absolute: 0 10*3/uL (ref 0.0–0.1)
Basophils Relative: 0 %
Eosinophils Absolute: 0.4 10*3/uL (ref 0.0–0.5)
Eosinophils Relative: 8 %
HCT: 31 % — ABNORMAL LOW (ref 36.0–46.0)
Hemoglobin: 9.5 g/dL — ABNORMAL LOW (ref 12.0–15.0)
Immature Granulocytes: 0 %
Lymphocytes Relative: 22 %
Lymphs Abs: 1.1 10*3/uL (ref 0.7–4.0)
MCH: 27.7 pg (ref 26.0–34.0)
MCHC: 30.6 g/dL (ref 30.0–36.0)
MCV: 90.4 fL (ref 80.0–100.0)
Monocytes Absolute: 0.8 10*3/uL (ref 0.1–1.0)
Monocytes Relative: 17 %
Neutro Abs: 2.6 10*3/uL (ref 1.7–7.7)
Neutrophils Relative %: 53 %
Platelets: 232 10*3/uL (ref 150–400)
RBC: 3.43 MIL/uL — ABNORMAL LOW (ref 3.87–5.11)
RDW: 18.6 % — ABNORMAL HIGH (ref 11.5–15.5)
WBC: 4.8 10*3/uL (ref 4.0–10.5)
nRBC: 0 % (ref 0.0–0.2)

## 2021-10-15 LAB — BASIC METABOLIC PANEL
Anion gap: 8 (ref 5–15)
BUN: 13 mg/dL (ref 8–23)
CO2: 32 mmol/L (ref 22–32)
Calcium: 8.6 mg/dL — ABNORMAL LOW (ref 8.9–10.3)
Chloride: 97 mmol/L — ABNORMAL LOW (ref 98–111)
Creatinine, Ser: 1.03 mg/dL — ABNORMAL HIGH (ref 0.44–1.00)
GFR, Estimated: 58 mL/min — ABNORMAL LOW (ref >60)
Glucose, Bld: 95 mg/dL (ref 70–99)
Potassium: 3.7 mmol/L (ref 3.5–5.1)
Sodium: 137 mmol/L (ref 135–145)

## 2021-10-15 LAB — H. PYLORI ANTIBODY, IGG: H Pylori IgG: 2.24 Index Value — ABNORMAL HIGH (ref 0.00–0.79)

## 2021-10-15 MED ORDER — POTASSIUM CHLORIDE CRYS ER 20 MEQ PO TBCR
40.0000 meq | EXTENDED_RELEASE_TABLET | Freq: Once | ORAL | Status: AC
  Administered 2021-10-15: 40 meq via ORAL
  Filled 2021-10-15: qty 2

## 2021-10-15 MED ORDER — POTASSIUM CHLORIDE ER 10 MEQ PO TBCR: EXTENDED_RELEASE_TABLET | ORAL | 3 refills | Status: DC

## 2021-10-15 NOTE — Telephone Encounter (Signed)
-----   Message from Loralie Champagne, PA-C sent at 10/13/2021 11:20 AM EST ----- Patient needs office visit follow-up with Dr. Derinda Sis, or myself.  Following up from anemia and her hospital stay.  We will need to discuss repeat EGD and colonoscopy.  Patient will need to be made aware of the appointment.  Thank you,  Jess

## 2021-10-15 NOTE — Telephone Encounter (Signed)
Sent letter to pt's home address. Follow-up schedule with Dr. Loletha Carrow on 11/21/21 at 10:40am.

## 2021-10-15 NOTE — Care Management Important Message (Signed)
Important Message  Patient Details  Name: Brittney Walker MRN: [id number] Date of Birth: [birth year]   Medicare Important Message Given:  Yes     Shelda Altes 10/15/2021, 10:14 AM

## 2021-10-15 NOTE — Plan of Care (Signed)
?  Problem: Clinical Measurements: ?Goal: Will remain free from infection ?Outcome: Progressing ?Goal: Diagnostic test results will improve ?Outcome: Progressing ?Goal: Respiratory complications will improve ?Outcome: Progressing ?  ?

## 2021-10-15 NOTE — Progress Notes (Signed)
Mobility Specialist: Progress Note   10/15/21 1127  Mobility  Activity Ambulated in hall  Level of Assistance Modified independent, requires aide device or extra time  Assistive Device Four wheel walker  Distance Ambulated (ft) 410 ft  Mobility Ambulated with assistance in hallway  Mobility Response Tolerated well  Mobility performed by Mobility specialist  Bed Position Chair  $Mobility charge 1 Mobility   Pre-Mobility: 67 HR Post-Mobility: 77 HR, 139/69 BP, 95% SpO2  Pt independent to stand and mod I during ambulation. Pt endorsed feeling light headed, tired, and SOB towards end of ambulation, recovered quickly after sitting in the chair. Pt has call bell and phone in reach.   [facility] Oather [place] Mobility Specialist Mobility Specialist Phone #1: [phone] Mobility Specialist Phone #2: [phone]

## 2021-10-15 NOTE — Telephone Encounter (Signed)
Zehr, Laban Emperor, PA-C  Timothy Lasso, RN So Danis actually wants the follow-up with him in about 4 weeks.   Thank you,   Jess

## 2021-10-15 NOTE — Addendum Note (Signed)
Addended by: Rubie Maid B on: 10/15/2021 03:55 PM   Modules accepted: Orders

## 2021-10-15 NOTE — Progress Notes (Signed)
  Transition of Care [facility]) Screening Note   Patient Details  Name: Brittney Walker Date of Birth: [birth year]   [facility] Care [facility]) CM/SW Contact:    Dawayne Patricia, RN Phone Number: 10/15/2021, 12:30 PM    Transition of Care Department [facility]) has reviewed patient and no TOC needs have been identified at this time. We will continue to monitor patient advancement through interdisciplinary progression rounds. If new patient transition needs arise, please place a TOC consult.

## 2021-10-15 NOTE — Progress Notes (Signed)
Progress Note  Patient Name: Brittney Walker Date of Encounter: 10/15/2021  Primary Cardiologist: Croitoru   Subjective   Slow improvement good diuresis   Inpatient Medications    Scheduled Meds:  apixaban  5 mg Oral BID   ferrous sulfate  325 mg Oral BID WC   furosemide  80 mg Intravenous BID   pantoprazole  40 mg Oral BID AC   potassium chloride SA  40 mEq Oral Daily   sodium chloride flush  3 mL Intravenous Q12H   vitamin B-12  1,000 mcg Oral Daily   Continuous Infusions:  sodium chloride Stopped (10/12/21 1824)   PRN Meds: sodium chloride, acetaminophen, benzonatate, guaiFENesin, ondansetron (ZOFRAN) IV, sodium chloride flush   Vital Signs    Vitals:   10/14/21 2003 10/14/21 2313 10/15/21 0324 10/15/21 0727  BP: (!) 122/59 (!) 103/45 (!) 110/56 105/66  Pulse: 80 73 74 74  Resp: 20 20 20 19   Temp: 98.8 F (37.1 C) 98.9 F (37.2 C) 98.1 F (36.7 C) 98.4 F (36.9 C)  TempSrc: Oral Oral Oral Oral  SpO2: 98% 96% 97% 95%  Weight:   107.1 kg   Height:        Intake/Output Summary (Last 24 hours) at 10/15/2021 9735 Last data filed at 10/15/2021 0600 Gross per 24 hour  Intake 960 ml  Output 4800 ml  Net -3840 ml   Filed Weights   10/13/21 0422 10/14/21 0348 10/15/21 0324  Weight: 113.9 kg 110.1 kg 107.1 kg    Telemetry    Sinus rhythm - Personally Reviewed  ECG    None today - Personally Reviewed  Physical Exam   GEN: No acute distress.   Neck: No JVD Cardiac: HGD,9/2 holosystolic murmurs, rubs, or gallops.  Respiratory: Clear to auscultation bilaterally. GI: Soft, nontender, non-distended  Plus 2 bilateral edema less tense  Neuro:  Nonfocal  Psych: Normal affect   Labs    Chemistry Recent Labs  Lab 10/13/21 0202 10/14/21 0232 10/15/21 0212  NA 136 134* 137  K 3.5 3.4* 3.7  CL 98 97* 97*  CO2 30 29 32  GLUCOSE 97 132* 95  BUN 10 11 13   CREATININE 0.92 0.88 1.03*  CALCIUM 8.4* 8.4* 8.6*  GFRNONAA >60 >60 58*  ANIONGAP 8 8 8       Hematology Recent Labs  Lab 10/13/21 0202 10/14/21 0232 10/15/21 0212  WBC 6.1 5.5 4.8  RBC 3.40* 3.24* 3.43*  HGB 9.5* 9.0* 9.5*  HCT 30.7* 28.8* 31.0*  MCV 90.3 88.9 90.4  MCH 27.9 27.8 27.7  MCHC 30.9 31.3 30.6  RDW 17.7* 17.9* 18.6*  PLT 211 228 232    Cardiac EnzymesNo results for input(s): TROPONINI in the last 168 hours. No results for input(s): TROPIPOC in the last 168 hours.   BNP Recent Labs  Lab 10/11/21 0626  BNP 460.6*     DDimer No results for input(s): DDIMER in the last 168 hours.   Radiology    No results found.  Cardiac Studies   TTE 10/11/2021 IMPRESSIONS   1. The RV is severely enlarged with mildly-to-moderately reduced systolic function (TAPSE 1.5, S' 8.4 cm/s). There is flattening of the interventricular septum in systole and diastole, consistent with RV  pressure and volume overload. IVC not well visualized. PASP is 23mmHg + RAP consistent with at least moderate (more  likely severe) pulmonary hypertension.   2. Left ventricular ejection fraction, by estimation, is 60 to 65%. The  left ventricle has normal function. The  left ventricle has no regional  wall motion abnormalities. There is mild concentric left ventricular  hypertrophy. Left ventricular diastolic  parameters were normal. There is the interventricular septum is flattened  in systole and diastole, consistent with right ventricular pressure and  volume overload.   3. Left atrial size was mildly dilated.   4. Right atrial size was severely dilated.   5. The mitral valve is normal in structure. Mild to moderate mitral valve  regurgitation.   6. Tricuspid valve regurgitation is severe.   7. The aortic valve is calcified. There is mild thickening of the aortic  valve. Aortic valve regurgitation is not visualized. Aortic valve  sclerosis is present, with no evidence of aortic valve stenosis.   Comparison(s): No prior Echocardiogram.   FINDINGS   Left Ventricle: Left  ventricular ejection fraction, by estimation, is 60  to 65%. The left ventricle has normal function. The left ventricle has no  regional wall motion abnormalities. The left ventricular internal cavity  size was normal in size. There is   mild concentric left ventricular hypertrophy. The interventricular septum  is flattened in systole and diastole, consistent with right ventricular  pressure and volume overload. Left ventricular diastolic parameters were  normal.   Right Ventricle: The right ventricular size is severely enlarged. Right  vetricular wall thickness was not well visualized. Right ventricular  systolic function is mildly reduced. There is moderately elevated  pulmonary artery systolic pressure. The  tricuspid regurgitant velocity is 3.35 m/s, and with an assumed right  atrial pressure of 8 mmHg, the estimated right ventricular systolic  pressure is 26.3 mmHg.   Left Atrium: Left atrial size was mildly dilated.   Right Atrium: Right atrial size was severely dilated.   Pericardium: There is no evidence of pericardial effusion.   Mitral Valve: The mitral valve is normal in structure. Mild mitral annular  calcification. Mild to moderate mitral valve regurgitation.   Tricuspid Valve: The tricuspid valve is normal in structure. Tricuspid  valve regurgitation is severe.   Aortic Valve: The aortic valve is calcified. There is mild thickening of  the aortic valve. Aortic valve regurgitation is not visualized. Aortic  valve sclerosis is present, with no evidence of aortic valve stenosis.   Pulmonic Valve: The pulmonic valve was not well visualized. Pulmonic valve  regurgitation is trivial.   Aorta: The aortic root and ascending aorta are structurally normal, with  no evidence of dilitation.   Venous: The inferior vena cava was not well visualized.   IAS/Shunts: There is left bowing of the interatrial septum, suggestive of  elevated right atrial pressure. No atrial level  shunt detected by color  flow Doppler.   Patient Profile     70 y.o. female with history of persistent atrial fibrillation status post cardioversion recently, now with symptomatic anemia on Eliquis, hypothyroidism status post radioactive iodine and obesity.  Assessment & Plan    Paroxysmal atrial fibrillation status post cardioversion, 10/11/21 continue anticoagulation maintaining NSR   Acute exacerbation of Diastolic heart failure -multifactorial given with tricuspid regurgitation with moderate pulmonary hypertension, mild to moderate mitral regurgitation.  Good diuresis  another 3.8  liters yesterday continue iv diuresis K/Cr stable   Moderate Pulmonary Hypertension-suspected likely secondary to her severe tricuspid regurgitation as well as potential sleep apnea.  For now we will diurese the patient and may eventually need a right heart catheterization which does not necessarily need to be in the inpatient setting    Symptomatic anemia s/p EGD  with  noted evidence of gastric ulcer.  We will continue PPIs with Protonix 40 mg twice daily.  Appreciate GI evaluating the patient in performing endoscopy.  Hct stable 31 H pylori pending   Morbid obesity-outpatient referral to healthy weight and wellness.   Shoot for d/c on Wendsday and transition to PO diuretics on d/c     Signed, Jenkins Rouge, MD  10/15/2021, 8:37 AM

## 2021-10-16 LAB — CBC WITH DIFFERENTIAL/PLATELET
Abs Immature Granulocytes: 0.06 10*3/uL (ref 0.00–0.07)
Basophils Absolute: 0 10*3/uL (ref 0.0–0.1)
Basophils Relative: 1 %
Eosinophils Absolute: 0.3 10*3/uL (ref 0.0–0.5)
Eosinophils Relative: 6 %
HCT: 30.4 % — ABNORMAL LOW (ref 36.0–46.0)
Hemoglobin: 9.3 g/dL — ABNORMAL LOW (ref 12.0–15.0)
Immature Granulocytes: 1 %
Lymphocytes Relative: 26 %
Lymphs Abs: 1.3 10*3/uL (ref 0.7–4.0)
MCH: 27.3 pg (ref 26.0–34.0)
MCHC: 30.6 g/dL (ref 30.0–36.0)
MCV: 89.1 fL (ref 80.0–100.0)
Monocytes Absolute: 0.9 10*3/uL (ref 0.1–1.0)
Monocytes Relative: 17 %
Neutro Abs: 2.5 10*3/uL (ref 1.7–7.7)
Neutrophils Relative %: 49 %
Platelets: 217 10*3/uL (ref 150–400)
RBC: 3.41 MIL/uL — ABNORMAL LOW (ref 3.87–5.11)
RDW: 18.9 % — ABNORMAL HIGH (ref 11.5–15.5)
WBC: 5.2 10*3/uL (ref 4.0–10.5)
nRBC: 0 % (ref 0.0–0.2)

## 2021-10-16 LAB — MAGNESIUM: Magnesium: 2.2 mg/dL (ref 1.7–2.4)

## 2021-10-16 NOTE — Evaluation (Signed)
Occupational Therapy Evaluation Patient Details Name: Brittney Walker MRN: [id number] DOB: 1951-07-18 Today's Date: 10/16/2021   History of Present Illness Pt is a 70 y.o. female s/p cardioversion on 10/10/21 admitted on 10/10/21 after finding of anemia on post cardioversion labs.  S/p loop recorder implant 12/8, EGD on 12/9. PMH includes CHF, hyperthyroidism, persistent a-fib   Clinical Impression   PTA, pt lives alone and reports typically Independent with ADLs, IADLs and mobility though has required intermittent assist due to progressive SOB. Pt presents now with improving SOB s/p procedures above and minor deficits in cardiopulmonary tolerance and standing balance. Pt overall Modified Independent with mobility short distances with RW (prefers Rollator for energy conservation) and overall Modified Independent for all ADLs except for LB dressing (difficulty reaching d/t edema). Pt would benefit from AE education, tub transfer simulation at acute level to maximize independence with ADLs. Recommend HHOT follow-up to further address ADLs/IADLs in home environment though pt could progress to no OT follow-up needed.       Recommendations for follow up therapy are one component of a multi-disciplinary discharge planning process, led by the attending physician.  Recommendations may be updated based on patient status, additional functional criteria and insurance authorization.   Follow Up Recommendations  Home health OT    Assistance Recommended at Discharge PRN  Functional Status Assessment  Patient has had a recent decline in their functional status and demonstrates the ability to make significant improvements in function in a reasonable and predictable amount of time.  Equipment Recommendations  BSC/3in1;Other (comment);Tub/shower bench Agricultural consultant)    Recommendations for Other Services       Precautions / Restrictions Precautions Precautions: Fall Restrictions Weight Bearing Restrictions:  No      Mobility Bed Mobility               General bed mobility comments: received in bathroom    Transfers Overall transfer level: Modified independent Equipment used: Rolling walker (2 wheels) Transfers: Sit to/from Stand;Bed to chair/wheelchair/BSC Sit to Stand: Modified independent (Device/Increase time)     Step pivot transfers: Modified independent (Device/Increase time)            Balance Overall balance assessment: Mild deficits observed, not formally tested                                         ADL either performed or assessed with clinical judgement   ADL Overall ADL's : Needs assistance/impaired Eating/Feeding: Independent;Sitting   Grooming: Modified independent;Standing;Wash/dry hands   Upper Body Bathing: Modified independent;Sitting   Lower Body Bathing: Modified independent;Sit to/from stand   Upper Body Dressing : Modified independent;Sitting   Lower Body Dressing: Minimal assistance;Sit to/from stand Lower Body Dressing Details (indicate cue type and reason): difficulty reaching B feet though edema improving. difficulty fully crossing LEs. attempted positional strategy education to trial though pt adamantly declined that this would work sitting on couch. May benefit from AE education Toilet Transfer: Modified Independent;Ambulation;Regular Toilet;Rolling walker (2 wheels) Toilet Transfer Details (indicate cue type and reason): received in bathroom, exited bathroom without assist Toileting- Clothing Manipulation and Hygiene: Modified independent;Sit to/from stand         General ADL Comments: Improving SOB since cardioversion, educated regarding energy conservation, tub transfer options and DME with handouts provided     Vision Baseline Vision/History: 1 Wears glasses Ability to See in Adequate Light: 0 Adequate Patient Visual  Report: No change from baseline Vision Assessment?: No apparent visual deficits      Perception     Praxis      Pertinent Vitals/Pain Pain Assessment: No/denies pain     Hand Dominance Right   Extremity/Trunk Assessment Upper Extremity Assessment Upper Extremity Assessment: Overall WFL for tasks assessed   Lower Extremity Assessment Lower Extremity Assessment: Defer to PT evaluation   Cervical / Trunk Assessment Cervical / Trunk Assessment: Normal   Communication Communication Communication: No difficulties   Cognition Arousal/Alertness: Awake/alert Behavior During Therapy: WFL for tasks assessed/performed Overall Cognitive Status: Within Functional Limits for tasks assessed                                 General Comments: likely at baseline, some decreased insight into improving deficits (decreased SOB) and safety with DME use     General Comments  VSS on RA. pt requesting small space heater for home use    Exercises     Shoulder Instructions      Home Living Family/patient expects to be discharged to:: Private residence Living Arrangements: Alone Available Help at Discharge: Friend(s);Available PRN/intermittently Type of Home: House Home Access: Stairs to enter Entrance Stairs-Number of Steps: 3   Home Layout: Two level;1/2 bath on main level Alternate Level Stairs-Number of Steps: flight   Bathroom Shower/Tub: Teacher, early years/pre: Standard     Home Equipment: None   Additional Comments: due to SOB and difficulty managing stairs inside home, pt has been sleeping on couch and sponge bathing in half bath downstairs      Prior Functioning/Environment Prior Level of Function : Independent/Modified Independent;Driving             Mobility Comments: no use of AD typically for mobility, difficulty managing stairs inside home d/t SOB ADLs Comments: Able to complete ADLs though difficult due to SOB and LE edema, sponge bathing in half bath due to difficulty with stair mgmt and stepping over tub upstairs.  Friends had been bringing in meals due to difficulty managing IADLs. Reports driving about once a week        OT Problem List: Decreased activity tolerance;Decreased knowledge of use of DME or AE;Decreased knowledge of precautions;Cardiopulmonary status limiting activity      OT Treatment/Interventions: Self-care/ADL training;Therapeutic exercise;Energy conservation;DME and/or AE instruction;Therapeutic activities;Patient/family education;Balance training    OT Goals(Current goals can be found in the care plan section) Acute Rehab OT Goals Patient Stated Goal: get a rollator, space heater for home use, decrease SOB OT Goal Formulation: With patient Time For Goal Achievement: 10/30/21 Potential to Achieve Goals: Good  OT Frequency: Min 2X/week   Barriers to D/C:            Co-evaluation              AM-PAC OT "6 Clicks" Daily Activity     Outcome Measure Help from another person eating meals?: None Help from another person taking care of personal grooming?: None Help from another person toileting, which includes using toliet, bedpan, or urinal?: None Help from another person bathing (including washing, rinsing, drying)?: None Help from another person to put on and taking off regular upper body clothing?: None Help from another person to put on and taking off regular lower body clothing?: A Little 6 Click Score: 23   End of Session Equipment Utilized During Treatment: Rolling walker (2 wheels) Nurse Communication: Mobility status  Activity Tolerance: Patient tolerated treatment well Patient left: in chair;with call bell/phone within reach  OT Visit Diagnosis: Other abnormalities of gait and mobility (R26.89)                Time: 9326-7124 OT Time Calculation (min): 19 min Charges:  OT General Charges $OT Visit: 1 Visit OT Evaluation $OT Eval Moderate Complexity: 1 Mod  Malachy Chamber, OTR/L Acute Rehab Services Office: [phone]   Layla Maw 10/16/2021, 8:28  AM

## 2021-10-16 NOTE — Anesthesia Postprocedure Evaluation (Signed)
Anesthesia Post Note  Patient: Brittney Walker  Procedure(s) Performed: ESOPHAGOGASTRODUODENOSCOPY (EGD) WITH PROPOFOL     Patient location during evaluation: PACU Anesthesia Type: MAC Level of consciousness: awake and alert Pain management: pain level controlled Vital Signs Assessment: post-procedure vital signs reviewed and stable Respiratory status: spontaneous breathing, nonlabored ventilation, respiratory function stable and patient connected to nasal cannula oxygen Cardiovascular status: stable and blood pressure returned to baseline Postop Assessment: no apparent nausea or vomiting Anesthetic complications: no   No notable events documented.  Last Vitals:  Vitals:   10/16/21 1149 10/16/21 1623  BP: (!) 113/51 (!) 108/53  Pulse: 66 99  Resp: 20   Temp: 36.6 C 36.6 C  SpO2: 98% 94%    Last Pain:  Vitals:   10/16/21 1623  TempSrc: Oral  PainSc:                  [name]

## 2021-10-16 NOTE — Evaluation (Signed)
Physical Therapy Evaluation and Discharge Patient Details Name: Brittney Walker MRN: [id number] DOB: 1951-02-15 Today's Date: 10/16/2021  History of Present Illness  Pt is a 71 y.o. female s/p cardioversion on 10/10/21, later admitted on same day after finding of anemia on post cardioversion labs.  S/p loop recorder implant 12/8, EGD on 12/9. PMH includes CHF, hyperthyroidism, persistent a-fib  Clinical Impression  Pt presents with decreased endurance, LE strength, and activity tolerance secondary to above. PTA, pt lives alone and typically independent with ADLs and mobility, with the exception of stairs. Currently, pt modified independent with ambulation, preferring a rollator for energy conservation. Required Min A for stairs, but pt reports she always has assist with stairs at home. Recommending HHPT after discharge, to maximize functional mobility and independence. Will d/c acute care PT at this time.     Recommendations for follow up therapy are one component of a multi-disciplinary discharge planning process, led by the attending physician.  Recommendations may be updated based on patient status, additional functional criteria and insurance authorization.  Follow Up Recommendations Home health PT    Assistance Recommended at Discharge PRN  Functional Status Assessment    Equipment Recommendations  BSC/3in1;Rollator (4 wheels);Hospital bed (per pt request)    Recommendations for Other Services       Precautions / Restrictions Precautions Precautions: Fall Restrictions Weight Bearing Restrictions: No      Mobility  Bed Mobility               General bed mobility comments: received in recliner    Transfers Overall transfer level: Modified independent Equipment used: Rollator (4 wheels) Transfers: Sit to/from Stand Sit to Stand: Modified independent (Device/Increase time)   Step pivot transfers: Modified independent (Device/Increase time)       General transfer  comment: increased time and use of UE to stand from recliner and rollator seat    Ambulation/Gait   Gait Distance (Feet): 334 Feet Assistive device: Rollator (4 wheels) Gait Pattern/deviations: Step-through pattern;Decreased stride length Gait velocity: decreased     General Gait Details: 1 seated rest break required due to SOB. Pt self-monitors well. Said she was a "little lightheaded" but not dizzy. Quickly improved after sitting down. Educated pt on how to back rollator against wall and lock it prior to taking a rest break.  Stairs Stairs: Yes Stairs assistance: Min assist Stair Management: Step to pattern;One rail Left;Forwards Number of Stairs: 3 General stair comments: Handheld assist on right, pt states this is how she completes the stairs at home. She reports she does not go down them alone. Heavy use of UE to pull on rail.  Wheelchair Mobility    Modified Rankin (Stroke Patients Only)       Balance Overall balance assessment: Mild deficits observed, not formally tested                                           Pertinent Vitals/Pain Pain Assessment: Faces Faces Pain Scale: Hurts a little bit Pain Location: pain in legs with palpation and AROM - pt reports it has improved with the compression stockings Pain Descriptors / Indicators: Pressure;Discomfort Pain Intervention(s): Limited activity within patient's tolerance;Monitored during session;Other (comment) (notified MD pt would like thigh high compression stockings)    Home Living Family/patient expects to be discharged to:: Private residence Living Arrangements: Alone Available Help at Discharge: Friend(s);Available PRN/intermittently Type of Home:  House Home Access: Stairs to enter Entrance Stairs-Rails: Chemical engineer of Steps: 3 Alternate Level Stairs-Number of Steps: flight - stairs are being remodeled Home Layout: Two level;1/2 bath on main level Home Equipment:  None Additional Comments: due to SOB and difficulty managing stairs inside home, pt has been sleeping on couch and sponge bathing in half bath downstairs    Prior Function Prior Level of Function : Independent/Modified Independent;Driving             Mobility Comments: no use of AD typically for mobility, unable to manage stairs inside home. Needs assist with stairs outside of home. ADLs Comments: Able to complete ADLs though difficult due to SOB and LE edema, sponge bathing in half bath due to difficulty with stair mgmt and stepping over tub upstairs. Friends had been bringing in meals due to difficulty managing IADLs. Reports driving about once a week     Hand Dominance   Dominant Hand: Right    Extremity/Trunk Assessment   Upper Extremity Assessment Upper Extremity Assessment: Overall WFL for tasks assessed    Lower Extremity Assessment Lower Extremity Assessment: Generalized weakness    Cervical / Trunk Assessment Cervical / Trunk Assessment: Normal  Communication   Communication: No difficulties  Cognition Arousal/Alertness: Awake/alert Behavior During Therapy: WFL for tasks assessed/performed Overall Cognitive Status: Within Functional Limits for tasks assessed                                         General Comments General comments (skin integrity, edema, etc.): HR 88, SpO2 96% after stairs. Pt would like a bed to fit downstairs due to not being able to get upstairs.    Exercises     Assessment/Plan    PT Assessment All further PT needs can be met in the next venue of care  PT Problem List Decreased strength;Decreased mobility;Decreased activity tolerance;Cardiopulmonary status limiting activity       PT Treatment Interventions      PT Goals (Current goals can be found in the Care Plan section)  Acute Rehab PT Goals PT Goal Formulation: All assessment and education complete, DC therapy    Frequency     Barriers to discharge         Co-evaluation               AM-PAC PT "6 Clicks" Mobility  Outcome Measure Help needed turning from your back to your side while in a flat bed without using bedrails?: None Help needed moving from lying on your back to sitting on the side of a flat bed without using bedrails?: None Help needed moving to and from a bed to a chair (including a wheelchair)?: None Help needed standing up from a chair using your arms (e.g., wheelchair or bedside chair)?: None Help needed to walk in hospital room?: None Help needed climbing 3-5 steps with a railing? : A Little 6 Click Score: 23    End of Session   Activity Tolerance: Patient tolerated treatment well Patient left: in chair;with call bell/phone within reach Nurse Communication: Mobility status PT Visit Diagnosis: Other abnormalities of gait and mobility (R26.89);Muscle weakness (generalized) (M62.81)    Time: 2703-5009 PT Time Calculation (min) (ACUTE ONLY): 31 min   Charges:   PT Evaluation $PT Eval Moderate Complexity: 1 Mod PT Treatments $Gait Training: 8-22 mins        Brandon Melnick, SPT  Brandon Melnick 10/16/2021, 9:42 AM

## 2021-10-16 NOTE — Progress Notes (Signed)
Progress Note  Patient Name: Brittney Walker Date of Encounter: 10/16/2021  Primary Cardiologist: Croitoru   Subjective   Seen working with PT this am Requesting thigh high compression stockings   Inpatient Medications    Scheduled Meds:  apixaban  5 mg Oral BID   ferrous sulfate  325 mg Oral BID WC   furosemide  80 mg Intravenous BID   pantoprazole  40 mg Oral BID AC   potassium chloride SA  40 mEq Oral Daily   sodium chloride flush  3 mL Intravenous Q12H   vitamin B-12  1,000 mcg Oral Daily   Continuous Infusions:  sodium chloride Stopped (10/12/21 1824)   PRN Meds: sodium chloride, acetaminophen, benzonatate, guaiFENesin, ondansetron (ZOFRAN) IV, sodium chloride flush   Vital Signs    Vitals:   10/16/21 0348 10/16/21 0613 10/16/21 0618 10/16/21 0806  BP: 113/68   (!) 136/45  Pulse: 72   95  Resp: 19 (!) 27 20 19   Temp: 98 F (36.7 C)   98.4 F (36.9 C)  TempSrc: Oral   Oral  SpO2: 98%   97%  Weight:  104.2 kg    Height:        Intake/Output Summary (Last 24 hours) at 10/16/2021 0935 Last data filed at 10/16/2021 6270 Gross per 24 hour  Intake 750 ml  Output 2700 ml  Net -1950 ml   Filed Weights   10/14/21 0348 10/15/21 0324 10/16/21 0613  Weight: 110.1 kg 107.1 kg 104.2 kg    Telemetry    Sinus rhythm - Personally Reviewed  ECG    None today - Personally Reviewed  Physical Exam   GEN: No acute distress.   Neck: No JVD Cardiac: JJK,0/9 holosystolic murmurs, rubs, or gallops.  Respiratory: Clear to auscultation bilaterally. GI: Soft, nontender, non-distended  Plus 2 bilateral edema less tense  Neuro:  Nonfocal  Psych: Normal affect   Labs    Chemistry Recent Labs  Lab 10/13/21 0202 10/14/21 0232 10/15/21 0212  NA 136 134* 137  K 3.5 3.4* 3.7  CL 98 97* 97*  CO2 30 29 32  GLUCOSE 97 132* 95  BUN 10 11 13   CREATININE 0.92 0.88 1.03*  CALCIUM 8.4* 8.4* 8.6*  GFRNONAA >60 >60 58*  ANIONGAP 8 8 8      Hematology Recent  Labs  Lab 10/14/21 0232 10/15/21 0212 10/16/21 0244  WBC 5.5 4.8 5.2  RBC 3.24* 3.43* 3.41*  HGB 9.0* 9.5* 9.3*  HCT 28.8* 31.0* 30.4*  MCV 88.9 90.4 89.1  MCH 27.8 27.7 27.3  MCHC 31.3 30.6 30.6  RDW 17.9* 18.6* 18.9*  PLT 228 232 217    Cardiac EnzymesNo results for input(s): TROPONINI in the last 168 hours. No results for input(s): TROPIPOC in the last 168 hours.   BNP Recent Labs  Lab 10/11/21 0626  BNP 460.6*     DDimer No results for input(s): DDIMER in the last 168 hours.   Radiology    No results found.  Cardiac Studies   TTE 10/11/2021 IMPRESSIONS   1. The RV is severely enlarged with mildly-to-moderately reduced systolic function (TAPSE 1.5, S' 8.4 cm/s). There is flattening of the interventricular septum in systole and diastole, consistent with RV  pressure and volume overload. IVC not well visualized. PASP is 20mmHg + RAP consistent with at least moderate (more  likely severe) pulmonary hypertension.   2. Left ventricular ejection fraction, by estimation, is 60 to 65%. The  left ventricle has normal function. The  left ventricle has no regional  wall motion abnormalities. There is mild concentric left ventricular  hypertrophy. Left ventricular diastolic  parameters were normal. There is the interventricular septum is flattened  in systole and diastole, consistent with right ventricular pressure and  volume overload.   3. Left atrial size was mildly dilated.   4. Right atrial size was severely dilated.   5. The mitral valve is normal in structure. Mild to moderate mitral valve  regurgitation.   6. Tricuspid valve regurgitation is severe.   7. The aortic valve is calcified. There is mild thickening of the aortic  valve. Aortic valve regurgitation is not visualized. Aortic valve  sclerosis is present, with no evidence of aortic valve stenosis.   Comparison(s): No prior Echocardiogram.   FINDINGS   Left Ventricle: Left ventricular ejection fraction, by  estimation, is 60  to 65%. The left ventricle has normal function. The left ventricle has no  regional wall motion abnormalities. The left ventricular internal cavity  size was normal in size. There is   mild concentric left ventricular hypertrophy. The interventricular septum  is flattened in systole and diastole, consistent with right ventricular  pressure and volume overload. Left ventricular diastolic parameters were  normal.   Right Ventricle: The right ventricular size is severely enlarged. Right  vetricular wall thickness was not well visualized. Right ventricular  systolic function is mildly reduced. There is moderately elevated  pulmonary artery systolic pressure. The  tricuspid regurgitant velocity is 3.35 m/s, and with an assumed right  atrial pressure of 8 mmHg, the estimated right ventricular systolic  pressure is 42.6 mmHg.   Left Atrium: Left atrial size was mildly dilated.   Right Atrium: Right atrial size was severely dilated.   Pericardium: There is no evidence of pericardial effusion.   Mitral Valve: The mitral valve is normal in structure. Mild mitral annular  calcification. Mild to moderate mitral valve regurgitation.   Tricuspid Valve: The tricuspid valve is normal in structure. Tricuspid  valve regurgitation is severe.   Aortic Valve: The aortic valve is calcified. There is mild thickening of  the aortic valve. Aortic valve regurgitation is not visualized. Aortic  valve sclerosis is present, with no evidence of aortic valve stenosis.   Pulmonic Valve: The pulmonic valve was not well visualized. Pulmonic valve  regurgitation is trivial.   Aorta: The aortic root and ascending aorta are structurally normal, with  no evidence of dilitation.   Venous: The inferior vena cava was not well visualized.   IAS/Shunts: There is left bowing of the interatrial septum, suggestive of  elevated right atrial pressure. No atrial level shunt detected by color  flow  Doppler.   Patient Profile     70 y.o. female with history of persistent atrial fibrillation status post cardioversion recently, now with symptomatic anemia on Eliquis, hypothyroidism status post radioactive iodine and obesity.  Assessment & Plan    Paroxysmal atrial fibrillation status post cardioversion, 10/11/21 continue anticoagulation maintaining NSR   Acute exacerbation of Diastolic heart failure -multifactorial given with tricuspid regurgitation with moderate pulmonary hypertension, mild to moderate mitral regurgitation.  Good diuresis  another 1.95   liters yesterday continue iv diuresis K/Cr stable   Moderate Pulmonary Hypertension-suspected likely secondary to her severe tricuspid regurgitation as well as potential sleep apnea.  For now we will diurese the patient and may eventually need a right heart catheterization which does not necessarily need to be in the inpatient setting    Symptomatic anemia s/p EGD  with noted evidence of gastric ulcer.  We will continue PPIs with Protonix 40 mg twice daily.  Appreciate GI evaluating the patient in performing endoscopy.  Hct stable 30.4 H pylori pending   Morbid obesity-outpatient referral to healthy weight and wellness.   Shoot for d/c on Wendsday and transition to PO diuretics on d/c  Thigh high compression hose ordered     Signed, Jenkins Rouge, MD  10/16/2021, 9:35 AM

## 2021-10-16 NOTE — TOC Progression Note (Signed)
Transition of Care [facility]) - Progression Note    Patient Details  Name: RHESA FORSBERG MRN: [id number] Date of Birth: 17-Oct-1951  Transition of Care [facility]) CM/SW [place], RN Phone Number:[phone]  10/16/2021, 12:04 PM  Clinical Narrative:    [facility] consulted for DME needs. PT has recommended hospital bed, 3in 1 and rollator. DME has been ordered through [employer]. Patient states that there is no one to meet DME company prior to d/c so the bed will need to be delivered after d/c. Patient has [employer] recommendations CM spoke with patient to offer choice. Patient has no preference for choice. HH has been set up with [facility]. Patient is requesting that CM follow up with a program called [facility] that she states will provide heater for home. Patient has no contact info and no info on the location of the program. CM is not aware of this resource but will attempt to find information. Patient has been advised that CM is not aware of this resource and can not guarantee their services. No other needs noted at this time. TOC will continue to follow.        Expected Discharge Plan and Services                                                 Social Determinants of Health (SDOH) Interventions    Readmission Risk Interventions No flowsheet data found.

## 2021-10-17 ENCOUNTER — Ambulatory Visit (HOSPITAL_COMMUNITY): Payer: Self-pay | Admitting: Physician Assistant

## 2021-10-17 ENCOUNTER — Telehealth: Payer: Self-pay | Admitting: Gastroenterology

## 2021-10-17 ENCOUNTER — Other Ambulatory Visit (HOSPITAL_COMMUNITY): Payer: Self-pay

## 2021-10-17 DIAGNOSIS — I272 Pulmonary hypertension, unspecified: Secondary | ICD-10-CM

## 2021-10-17 DIAGNOSIS — Z23 Encounter for immunization: Secondary | ICD-10-CM | POA: Diagnosis present

## 2021-10-17 DIAGNOSIS — I5031 Acute diastolic (congestive) heart failure: Secondary | ICD-10-CM

## 2021-10-17 LAB — CBC WITH DIFFERENTIAL/PLATELET
Abs Immature Granulocytes: 0.02 10*3/uL (ref 0.00–0.07)
Basophils Absolute: 0 10*3/uL (ref 0.0–0.1)
Basophils Relative: 1 %
Eosinophils Absolute: 0.4 10*3/uL (ref 0.0–0.5)
Eosinophils Relative: 8 %
HCT: 32.5 % — ABNORMAL LOW (ref 36.0–46.0)
Hemoglobin: 10.1 g/dL — ABNORMAL LOW (ref 12.0–15.0)
Immature Granulocytes: 0 %
Lymphocytes Relative: 26 %
Lymphs Abs: 1.2 10*3/uL (ref 0.7–4.0)
MCH: 27.9 pg (ref 26.0–34.0)
MCHC: 31.1 g/dL (ref 30.0–36.0)
MCV: 89.8 fL (ref 80.0–100.0)
Monocytes Absolute: 0.7 10*3/uL (ref 0.1–1.0)
Monocytes Relative: 15 %
Neutro Abs: 2.3 10*3/uL (ref 1.7–7.7)
Neutrophils Relative %: 50 %
Platelets: 230 10*3/uL (ref 150–400)
RBC: 3.62 MIL/uL — ABNORMAL LOW (ref 3.87–5.11)
RDW: 18.8 % — ABNORMAL HIGH (ref 11.5–15.5)
WBC: 4.6 10*3/uL (ref 4.0–10.5)
nRBC: 0 % (ref 0.0–0.2)

## 2021-10-17 MED ORDER — FUROSEMIDE 40 MG PO TABS
60.0000 mg | ORAL_TABLET | Freq: Two times a day (BID) | ORAL | 11 refills | Status: DC
  Filled 2021-10-17: qty 90, 30d supply, fill #0

## 2021-10-17 MED ORDER — POTASSIUM CHLORIDE CRYS ER 20 MEQ PO TBCR
20.0000 meq | EXTENDED_RELEASE_TABLET | Freq: Two times a day (BID) | ORAL | 6 refills | Status: DC
  Filled 2021-10-17: qty 60, 30d supply, fill #0

## 2021-10-17 MED ORDER — DOXYCYCLINE HYCLATE 100 MG PO TABS: 100.0000 mg | ORAL_TABLET | Freq: Two times a day (BID) | ORAL | 0 refills | Status: DC

## 2021-10-17 MED ORDER — METRONIDAZOLE 500 MG PO TABS: 500.0000 mg | ORAL_TABLET | Freq: Three times a day (TID) | ORAL | 0 refills | Status: AC

## 2021-10-17 MED ORDER — PANTOPRAZOLE SODIUM 40 MG PO TBEC: 40.0000 mg | DELAYED_RELEASE_TABLET | Freq: Two times a day (BID) | ORAL | 6 refills | Status: DC

## 2021-10-17 MED ORDER — PANTOPRAZOLE SODIUM 40 MG PO TBEC
40.0000 mg | DELAYED_RELEASE_TABLET | Freq: Two times a day (BID) | ORAL | 6 refills | Status: DC
  Filled 2021-10-17: qty 60, 30d supply, fill #0

## 2021-10-17 MED ORDER — METRONIDAZOLE 500 MG PO TABS
500.0000 mg | ORAL_TABLET | Freq: Three times a day (TID) | ORAL | 0 refills | Status: DC
  Filled 2021-10-17: qty 42, 14d supply, fill #0

## 2021-10-17 MED ORDER — BISMUTH SUBSALICYLATE 262 MG PO CHEW
524.0000 mg | CHEWABLE_TABLET | Freq: Four times a day (QID) | ORAL | 0 refills | Status: DC
  Filled 2021-10-17: qty 112, 14d supply, fill #0

## 2021-10-17 MED ORDER — FUROSEMIDE 40 MG PO TABS: 60.0000 mg | ORAL_TABLET | Freq: Two times a day (BID) | ORAL | 11 refills | Status: DC

## 2021-10-17 MED ORDER — DOXYCYCLINE HYCLATE 100 MG PO TABS
100.0000 mg | ORAL_TABLET | Freq: Two times a day (BID) | ORAL | 0 refills | Status: DC
  Filled 2021-10-17: qty 28, 14d supply, fill #0

## 2021-10-17 MED ORDER — BISMUTH SUBSALICYLATE 262 MG PO CHEW: 524.0000 mg | CHEWABLE_TABLET | Freq: Four times a day (QID) | ORAL | 0 refills | Status: AC

## 2021-10-17 MED ORDER — POTASSIUM CHLORIDE CRYS ER 20 MEQ PO TBCR: 20.0000 meq | EXTENDED_RELEASE_TABLET | Freq: Two times a day (BID) | ORAL | 6 refills | Status: DC

## 2021-10-17 NOTE — Progress Notes (Signed)
Pt being d/c, VSS, IV removed, Education complete.   Chrisandra Carota, RN 10/17/2021 3:16 PM

## 2021-10-17 NOTE — Progress Notes (Signed)
Mobility Specialist Progress Note:   10/17/21 1232  Mobility  Activity Ambulated in hall  Level of Assistance Modified independent, requires aide device or extra time  Assistive Device Front wheel walker  Distance Ambulated (ft) 810 ft  Mobility Ambulated with assistance in hallway  Mobility Response Tolerated well  Mobility performed by Mobility specialist  Bed Position Chair  $Mobility charge 1 Mobility   Pt received in chair. No complaints of pain. Pt returned to chair with call bell in reach and all needs met.   [facility] Public librarian Phone [phone] Secondary Phone [phone]

## 2021-10-17 NOTE — Progress Notes (Signed)
Occupational Therapy Treatment Patient Details Name: Brittney Walker MRN: [id number] DOB: Aug 05, 1951 Today's Date: 10/17/2021   History of present illness Pt is a 70 y.o. female s/p cardioversion on 10/10/21, later admitted on same day after finding of anemia on post cardioversion labs.  S/p loop recorder implant 12/8, EGD on 12/9. PMH includes CHF, hyperthyroidism, persistent a-fib   OT comments  Patient seen by skilled OT to address energy conservation, LB dressing, and functional transfers. Patient provided handout for energy conservation and was reviewed with patient. LB dressing addressed with AE training using sock aide to donn socks. Patient was able to doff socks but had difficulty donning. Sock aide was introduced to patient and demonstration provided.  Patient was able to return demonstration with verbal cues.  Patient was provided with information on where to purchase. Patient educated on tub bench for tub transfers. Patient is expected to discharge home today with [place].    Recommendations for follow up therapy are one component of a multi-disciplinary discharge planning process, led by the attending physician.  Recommendations may be updated based on patient status, additional functional criteria and insurance authorization.    Follow Up Recommendations  Home health OT    Assistance Recommended at Discharge PRN  Equipment Recommendations  BSC/3in1;Other (comment);Tub/shower bench    Recommendations for Other Services      Precautions / Restrictions Precautions Precautions: Fall       Mobility Bed Mobility               General bed mobility comments: received in recliner    Transfers Overall transfer level: Modified independent Equipment used: Rolling walker (2 wheels)               General transfer comment: able to perform toilet and recliner transfers without assistance and need for verbal cues.     Balance                                            ADL either performed or assessed with clinical judgement   ADL Overall ADL's : Needs assistance/impaired                     Lower Body Dressing: Supervision/safety;Sitting/lateral leans Lower Body Dressing Details (indicate cue type and reason): education on sock aide use for donning socks with patient requiring verbal cues to perform following demonstration Toilet Transfer: Modified Independent;Ambulation;Regular Toilet;Rolling walker (2 wheels)             General ADL Comments: patient shown sock aide and demonstrated use with patient able to return demonstration    Extremity/Trunk Assessment              Vision       Perception     Praxis      Cognition Arousal/Alertness: Awake/alert Behavior During Therapy: WFL for tasks assessed/performed Overall Cognitive Status: Within Functional Limits for tasks assessed                                 General Comments: demonstrated good understanding of AE use for LB dressing          Exercises     Shoulder Instructions       General Comments handout provided for energy conservation and reviewed with patient    Pertinent  Vitals/ Pain       Pain Assessment: Faces Faces Pain Scale: No hurt  Home Living                                          Prior Functioning/Environment              Frequency  Min 2X/week        Progress Toward Goals  OT Goals(current goals can now be found in the care plan section)  Progress towards OT goals: Progressing toward goals  Acute Rehab OT Goals Patient Stated Goal: go home OT Goal Formulation: With patient Time For Goal Achievement: 10/30/21 Potential to Achieve Goals: Good ADL Goals Pt Will Perform Lower Body Dressing: with modified independence;sit to/from stand;with adaptive equipment Pt Will Perform Tub/Shower Transfer: Tub transfer;with modified independence;ambulating;rolling walker Additional ADL  Goal #1: Pt to verbalize at least 3 energy conservation strategies to implement during ADLs/IADLs  Plan Discharge plan remains appropriate    Co-evaluation                 AM-PAC OT "6 Clicks" Daily Activity     Outcome Measure   Help from another person eating meals?: None Help from another person taking care of personal grooming?: None Help from another person toileting, which includes using toliet, bedpan, or urinal?: None Help from another person bathing (including washing, rinsing, drying)?: None Help from another person to put on and taking off regular upper body clothing?: None Help from another person to put on and taking off regular lower body clothing?: A Little 6 Click Score: 23    End of Session Equipment Utilized During Treatment: Rolling walker (2 wheels)  OT Visit Diagnosis: Other abnormalities of gait and mobility (R26.89)   Activity Tolerance Patient tolerated treatment well   Patient Left in chair;with call bell/phone within reach   Nurse Communication Mobility status        Time: 1250-1313 OT Time Calculation (min): 23 min  Charges: OT General Charges $OT Visit: 1 Visit OT Treatments $Self Care/Home Management : 8-22 mins $Therapeutic Activity: 8-22 mins  Lodema Hong, [place]  Pager [phone] Office [phone]   Trixie Dredge 10/17/2021, 3:02 PM

## 2021-10-17 NOTE — Plan of Care (Signed)
  Problem: Clinical Measurements: Goal: Diagnostic test results will improve Outcome: Progressing Goal: Respiratory complications will improve Outcome: Progressing Goal: Cardiovascular complication will be avoided Outcome: Progressing   

## 2021-10-17 NOTE — Telephone Encounter (Signed)
After reviewing this patient's discharge summary and after further consideration, I think it would be best for her EGD and colonoscopy to be performed in the [facility] outpatient endoscopy lab.  Limited available slots for that, as you know.  I currently have slots open on Tuesday, February 21.  This will fit well with the planned timing I mentioned in the previous telephone note.  Thank you  HD

## 2021-10-17 NOTE — Progress Notes (Signed)
Mobility Specialist Progress Note:   10/17/21 1000  Mobility  Activity Transferred to/from [facility]  Level of Assistance Modified independent, requires aide device or extra time  Assistive Device None  Distance Ambulated (ft) 2 ft  Mobility Out of bed for toileting  Mobility Response Tolerated well  Mobility performed by Mobility specialist  Bed Position Chair  $Mobility charge 1 Mobility   Pt received in chair. Pt just received lasix and has requested more ambulation a little later today. Will follow-up later for ore ambulation.   [employer] Public librarian Phone [phone] Secondary Phone [phone]

## 2021-10-17 NOTE — TOC Transition Note (Signed)
Transition of Care (TOC) - CM/SW Discharge Note Marvetta Gibbons RN, BSN Transitions of Care Unit 4E- RN Case Manager See Treatment Team for direct phone #    Patient Details  Name: Brittney Walker MRN: [id number] Date of Birth: 1950-11-09  Transition of [facility]) CM/SW Contact:  Dawayne Patricia, RN Phone Number: 10/17/2021, 11:55 AM   Clinical Narrative:    Pt stable for transition home today, per CM Marta Lamas- HH has been set up with [facility] (formally [facility]) for HHPT/OT. They will contact pt post discharge for start of care.  DME referral was made to [employer] however pt only has Medicare part A and no coverage at this time for DME- pt has spoken with [employer] and to follow up for payment regarding DME rental.  Patient has stated that she has enrolled with [facility] Medicare for part A/B/D that will start in Jan. 2023.   Have requested scripts for discharge be sent to [place] here at [facility] to make sure pt has meds prior to discharge.    Final next level of care: [place] Barriers to Discharge: Barriers Resolved   Patient Goals and CMS Choice Patient states their goals for this hospitalization and ongoing recovery are:: return home CMS Medicare.gov Compare Post Acute Care list provided to:: Patient Choice offered to / list presented to : Patient  Discharge Placement               Home w/ [facility]        Discharge Plan and Services   Discharge Planning Services: CM Consult Post Acute Care Choice: Durable Medical Equipment, Home Health          DME Arranged: 3-N-1, Hospital bed, Walker rolling with seat DME Agency: [employer] Date DME Agency Contacted: 10/16/21 Time DME Agency Contacted: 1218 Representative spoke with at DME Agency: [place]: PT, OT [place] Agency: [facility]now [place] Date [place]: 10/16/21 Time [place]: 1219 Representative spoke with at [place]: [place] ([place]) Interventions     Readmission Risk Interventions Readmission Risk Prevention Plan 10/17/2021  Transportation Screening Complete  PCP or Specialist Appt within 5-7 Days Complete  Home Care Screening Complete  Medication Review (RN CM) Complete  Some recent data might be hidden

## 2021-10-17 NOTE — Discharge Summary (Signed)
Discharge Summary    Patient ID: ADER FRITZE MRN: [id number]; DOB: Dec 09, 1950  Admit date: 10/10/2021 Discharge date: 10/17/2021  PCP:  Colon Branch, MD   [facility] Providers Cardiologist:  Dr. 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    Morbid obesity   Diagnostic Studies/Procedures    Echo 10/11/2021  1. The RV is severely enlarged with mildly-to-moderately reduced systolic  function (TAPSE 1.5, S' 8.4 cm/s). There is flattening of the  interventricular septum in systole and diastole, consistent with RV  pressure and volume overload. IVC not well  visualized. PASP is 5mmHg + RAP consistent with at least moderate (more  likely severe) pulmonary hypertension.   2. Left ventricular ejection fraction, by estimation, is 60 to 65%. The  left ventricle has normal function. The left ventricle has no regional  wall motion abnormalities. There is mild concentric left ventricular  hypertrophy. Left ventricular diastolic  parameters were normal. There is the interventricular septum is flattened  in systole and diastole, consistent with right ventricular pressure and  volume overload.   3. Left atrial size was mildly dilated.   4. Right atrial size was severely dilated.   5. The mitral valve is normal in structure. Mild to moderate mitral valve  regurgitation.   6. Tricuspid valve regurgitation is severe.   7. The aortic valve is calcified. There is mild thickening of the aortic  valve. Aortic valve regurgitation is not visualized. Aortic valve  sclerosis is present, with no evidence of aortic valve stenosis.   Comparison(s): No prior Echocardiogram.    History of Present Illness     Brittney Walker is a 70 y.o.  female with a history of persistent atrial fibrillation status post cardioversion 12/7, hyperthyroidism status post radioactive iodine, vulvar neoplasia, normocytic anemia, and CHF, who presents for admission after finding of anemia on post cardioversion labs.   Pt reports a long h/o DOE but notes that she was always very active until sometime in late July/August, at which point DOE progressed and she started experiencing lower ext edema (woody) and increasing abd girth.  She believes that her wt started climbing as well.  She has never experienced chest pain. Due to progressive symptoms, patient was seen in the emergency department on September 14, after presenting with symptoms of worsening dyspnea times several weeks.  She was found to be in atrial fibrillation.  In the setting of dyspnea and lower extremity swelling, she was placed on Lasix and followed up in A. fib clinic on September 20.  Plan was for outpatient echo, ongoing anticoagulation, and eventual cardioversion.  Of note, echocardiogram was never performed.    Unfortunately, pt put off testing due to lack of insurance, however, as atrial fibrillation persisted and dyspnea/edema/bloating worsened, she was scheduled for cardioversion after being seeing 11/9.  She presented to short stay 12/7 and underwent successful cardioversion with 1 synchronized biphasic 120 J shock.  Of note, prior to the procedure labs were sent off.  Labs returned postprocedure, after patient had already left, showing worsening normocytic anemia with a hemoglobin of 6.8 and hematocrit of 23.0.  Serum iron was 15 with a TIBC of 525.  Ferritin was low normal at 18.  Renal function and  electrolytes were stable.  In light of worsening anemia, patient was contacted and advised to present for direct admission and transfusion.   She feels better following cardioversion but was very dyspneic walking into the hospital just now.    Hospital Course     Consultants: GI    Paroxysmal atrial fibrillation: status post cardioversion 12/7, now in sinus rhythm. Continue Eliquis for anticoagulation.  Has not required AV nodal blocking agent with heart rate being controlled.  Ideally we plan to continue her anticoagulation for 4 weeks as she has recently been cardioverted.  Source of atrial fibrillation may be multifactorial including sleep apnea which she will need outpatient sleep study.  Acute exacerbation of Diastolic heart failure: -multifactorial given with tricuspid regurgitation with moderate pulmonary hypertension, mild to moderate mitral regurgitation.  Treated with high dose IV lasix and metolazone. Net I & O negative 20L. Weight down 263>>227lb. She will be discharged on lasix 60mg  BID and Kdur 71meq BID. Plan for BMP/BNP & CBC in 2 weeks at follow up.    Moderate Pulmonary Hypertension:-suspected likely secondary to her severe tricuspid regurgitation as well as potential sleep apnea.  Diuresed as above.  She may eventually need a right heart catheterization in outpatient setting and a complete work-up with sideration of treatment likely to benefit from phosphodiesterase inhibitors.   Severe Tricuspid Regurgitation, mild-moderate mitral regurgitation:  Etiology of her severe tricuspid regurgitation but does not appear to be primary valvular dysfunction but likely functional due to her pulmonary hypertension. Consider outpatient echo.    Symptomatic anemia: Total transfusion 3 units of PRBCs. Received IV iron infusion. Seen by GI s/p ECG with noted evidence of gastric ulcer. Treated with Protonix 40 mg twice daily.  H. pylori  came back positive. Patient will be treated with Bismuth, PPI and abx per recommendations. Outpatient follow up with GI. Unfortunately, can not stop Eliquis for 4 weeks from cardioversion.   Morbid obesity: Consider outpatient referral to healthy weight and wellness.   Patient under went [employer] Reveal Linq model number [id number] implantation  on 10/11/21 by Dr. Sallyanne Kuster for ALLEVIATE HF (clinical trial).  Patient does not have an insurance. Discussed with case manager who will arrange medications from [place]. Likely will have insurance from Jan 2023.  Did the patient have an acute coronary syndrome (MI, NSTEMI, STEMI, etc) this admission?:  No                               Did the patient have a percutaneous coronary intervention (stent / angioplasty)?:  No.     The patient will be scheduled for a TOC follow up appointment in 7-10 days.  A message has been sent to the [facility] and Scheduling Pool at the office where the patient should be seen for follow up.    Discharge Vitals Blood pressure (!) 118/53, pulse 68, temperature 98 F (36.7 C), temperature source Oral, resp. rate 20, height 5\' 6"  (1.676 m), weight 103.1 kg, last menstrual period 11/05/1995, SpO2 100 %.  Filed Weights   10/15/21 0324 10/16/21 0613 10/17/21 0433  Weight: 107.1 kg 104.2 kg 103.1 kg    Labs & Radiologic Studies    CBC Recent Labs    10/16/21 0244 10/17/21 0136  WBC 5.2 4.6  NEUTROABS 2.5 2.3  HGB 9.3* 10.1*  HCT 30.4* 32.5*  MCV 89.1 89.8  PLT 217 106   Basic Metabolic Panel Recent Labs  10/15/21 0212 10/16/21 0244  NA 137  --   K 3.7  --   CL 97*  --   CO2 32  --   GLUCOSE 95  --   BUN 13  --   CREATININE 1.03*  --   CALCIUM 8.6*  --   MG  --  2.2    _____________  DG Chest 2 View  Result Date: 10/11/2021 CLINICAL DATA:  Difficulty breathing EXAM: CHEST - 2 VIEW COMPARISON:  Previous studies including the examination of 09/03/2021 FINDINGS: Transverse diameter of heart is increased. There are no signs of alveolar pulmonary edema. There is poor inspiration. There is blunting of lateral CP angles. There is no pneumothorax. Patchy infiltrates are seen in the right lower lung fields. There is interval improvement in aeration in the right parahilar region. IMPRESSION: Cardiomegaly. There are no signs of pulmonary edema.  Increased density in the right lower lung fields may suggest pleural effusion and possibly underlying atelectasis. Electronically Signed   By: Elmer Picker M.D.   On: 10/11/2021 08:48   ECHOCARDIOGRAM COMPLETE  Result Date: 10/11/2021    ECHOCARDIOGRAM REPORT   Patient Name:   ISHA SEEFELD Date of Exam: 10/11/2021 Medical Rec #:  [id number]        Height:       66.0 in Accession #:    [id number]       Weight:       262.3 lb Date of Birth:  11-05-1950        BSA:          2.244 m Patient Age:    5 years         BP:           141/68 mmHg Patient Gender: F                HR:           74 bpm. Exam Location:  Inpatient Procedure: 2D Echo, Cardiac Doppler and Color Doppler Indications:    Dyspnea  History:        Patient has no prior history of Echocardiogram examinations.                 CHF; Arrythmias:Atrial Fibrillation.  Sonographer:    Bernadene Person RDCS Referring Phys: [id number] CHRISTOPHER RONALD [place]  1. The RV is severely enlarged with mildly-to-moderately reduced systolic function (TAPSE 1.5, S' 8.4 cm/s). There is flattening of the interventricular septum in systole and diastole, consistent with RV pressure and volume overload. IVC not well visualized. PASP is 10mmHg + RAP consistent with at least moderate (more likely severe) pulmonary hypertension.  2. Left ventricular ejection fraction, by estimation, is 60 to 65%. The left ventricle has normal function. The left ventricle has no regional wall motion abnormalities. There is mild concentric left ventricular hypertrophy. Left ventricular diastolic parameters were normal. There is the interventricular septum is flattened in systole and diastole, consistent with right ventricular pressure and volume overload.  3. Left atrial size was mildly dilated.  4. Right atrial size was severely dilated.  5. The mitral valve is normal in structure. Mild to moderate mitral valve regurgitation.  6. Tricuspid valve regurgitation is severe.  7. The  aortic valve is calcified. There is mild thickening of the aortic valve. Aortic valve regurgitation is not visualized. Aortic valve sclerosis is present, with no evidence of aortic valve stenosis. Comparison(s): No prior Echocardiogram. FINDINGS  Left Ventricle: Left ventricular ejection fraction, by estimation, is  60 to 65%. The left ventricle has normal function. The left ventricle has no regional wall motion abnormalities. The left ventricular internal cavity size was normal in size. There is  mild concentric left ventricular hypertrophy. The interventricular septum is flattened in systole and diastole, consistent with right ventricular pressure and volume overload. Left ventricular diastolic parameters were normal. Right Ventricle: The right ventricular size is severely enlarged. Right vetricular wall thickness was not well visualized. Right ventricular systolic function is mildly reduced. There is moderately elevated pulmonary artery systolic pressure. The tricuspid regurgitant velocity is 3.35 m/s, and with an assumed right atrial pressure of 8 mmHg, the estimated right ventricular systolic pressure is 30.0 mmHg. Left Atrium: Left atrial size was mildly dilated. Right Atrium: Right atrial size was severely dilated. Pericardium: There is no evidence of pericardial effusion. Mitral Valve: The mitral valve is normal in structure. Mild mitral annular calcification. Mild to moderate mitral valve regurgitation. Tricuspid Valve: The tricuspid valve is normal in structure. Tricuspid valve regurgitation is severe. Aortic Valve: The aortic valve is calcified. There is mild thickening of the aortic valve. Aortic valve regurgitation is not visualized. Aortic valve sclerosis is present, with no evidence of aortic valve stenosis. Pulmonic Valve: The pulmonic valve was not well visualized. Pulmonic valve regurgitation is trivial. Aorta: The aortic root and ascending aorta are structurally normal, with no evidence of  dilitation. Venous: The inferior vena cava was not well visualized. IAS/Shunts: There is left bowing of the interatrial septum, suggestive of elevated right atrial pressure. No atrial level shunt detected by color flow Doppler.  LEFT VENTRICLE PLAX 2D LVIDd:         4.90 cm   Diastology LVIDs:         2.90 cm   LV e' medial:    8.51 cm/s LV PW:         1.20 cm   LV E/e' medial:  14.2 LV IVS:        1.20 cm   LV e' lateral:   10.60 cm/s LVOT diam:     2.00 cm   LV E/e' lateral: 11.4 LV SV:         65 LV SV Index:   29 LVOT Area:     3.14 cm  RIGHT VENTRICLE RV S prime:     8.35 cm/s TAPSE (M-mode): 1.5 cm LEFT ATRIUM             Index        RIGHT ATRIUM           Index LA diam:        4.20 cm 1.87 cm/m   RA Area:     27.20 cm LA Vol (A2C):   96.9 ml 43.18 ml/m  RA Volume:   99.00 ml  44.11 ml/m LA Vol (A4C):   86.9 ml 38.72 ml/m LA Biplane Vol: 93.4 ml 41.62 ml/m  AORTIC VALVE LVOT Vmax:   114.00 cm/s LVOT Vmean:  71.300 cm/s LVOT VTI:    0.207 m  AORTA Ao Root diam: 3.10 cm Ao Asc diam:  3.30 cm MITRAL VALVE                  TRICUSPID VALVE MV Area (PHT): 5.13 cm       TR Peak grad:   44.9 mmHg MV Decel Time: 148 msec       TR Vmax:        335.00 cm/s MR Peak grad:    101.6 mmHg  MR Mean grad:    72.0 mmHg    SHUNTS MR Vmax:         504.00 cm/s  Systemic VTI:  0.21 m MR Vmean:        408.0 cm/s   Systemic Diam: 2.00 cm MR PISA:         1.01 cm MR PISA Eff ROA: 8 mm MR PISA Radius:  0.40 cm MV E velocity: 121.00 cm/s MV A velocity: 44.70 cm/s MV E/A ratio:  2.71 Gwyndolyn Kaufman MD Electronically signed by Gwyndolyn Kaufman MD Signature Date/Time: 10/11/2021/12:14:21 PM    Final    Disposition   Pt is being discharged home today in good condition.  Follow-up Plans & Appointments     Follow-up Information     Croitoru, Mihai, MD Follow up on 10/25/2021.   Specialty: Cardiology Why: @1 :30pm for hospital follow up Contact information: [address] [place]  [id number] [phone]                Discharge Instructions     Diet - low sodium heart healthy   Complete by: As directed    Increase activity slowly   Complete by: As directed        Discharge Medications   Allergies as of 10/17/2021   No Known Allergies      Medication List     TAKE these medications    apixaban 5 MG Tabs tablet Commonly known as: ELIQUIS Take 1 tablet (5 mg total) by mouth 2 (two) times daily.   bismuth subsalicylate 329 MG chewable tablet Commonly known as: PEPTO BISMOL Chew 2 tablets (524 mg total) by mouth in the morning, at noon, in the evening, and at bedtime for 14 days.   calcium carbonate 1250 (500 Ca) MG tablet Commonly known as: OS-CAL - dosed in mg of elemental calcium Take 1,250 mg by mouth daily with breakfast.   cholecalciferol 1000 units tablet Commonly known as: VITAMIN D Take 1,000 Units by mouth daily.   doxycycline 100 MG tablet Commonly known as: ADOXA Take 1 tablet (100 mg total) by mouth 2 (two) times daily.   ferrous sulfate 325 (65 FE) MG tablet Take 1 tablet (325 mg total) by mouth 2 (two) times daily with a meal.   furosemide 40 MG tablet Commonly known as: Lasix Take 1.5 tablets (60 mg total) by mouth 2 (two) times daily. What changed: how much to take   metroNIDAZOLE 500 MG tablet Commonly known as: FLAGYL Take 1 tablet (500 mg total) by mouth 3 (three) times daily for 14 days.   multivitamin with minerals Tabs tablet Take 1 tablet by mouth in the morning.   pantoprazole 40 MG tablet Commonly known as: PROTONIX Take 1 tablet (40 mg total) by mouth 2 (two) times daily before a meal.   polyvinyl alcohol 1.4 % ophthalmic solution Commonly known as: LIQUIFILM TEARS Place 1 drop into both eyes as needed for dry eyes.   potassium chloride SA 20 MEQ tablet Commonly known as: KLOR-CON M Take 1 tablet (20 mEq total) by mouth 2 (two) times daily. What changed: when to take this   vitamin A 10000 UNIT  capsule Take 10,000 Units by mouth daily.   vitamin B-12 1000 MCG tablet Commonly known as: CYANOCOBALAMIN Take 1,000 mcg by mouth daily.   vitamin C 500 MG tablet Commonly known as: ASCORBIC ACID Take 1,000 mg by mouth daily.   vitamin E 180 MG (400 UNITS) capsule Take 400 Units  by mouth daily.   ZINC PO Take 1 tablet by mouth in the morning.               Durable Medical Equipment  (From admission, onward)           Start     Ordered   10/16/21 1154  For home use only DME 4 wheeled rolling walker with seat  Once       Question:  Patient needs a walker to treat with the following condition  Answer:  Weakness   10/16/21 1154   10/16/21 1153  For home use only DME 3 n 1  Once        10/16/21 1154   10/16/21 1153  For home use only DME Hospital bed  Once       Question Answer Comment  Length of Need 12 Months   Bed type Semi-electric      10/16/21 1154               Outstanding Labs/Studies   BMP/BNP & CBC in 2 weeks at follow up  Duration of Discharge Encounter   Greater than 30 minutes including physician time.  Jarrett Soho, PA 10/17/2021, 10:34 AM

## 2021-10-17 NOTE — Telephone Encounter (Signed)
[place]  This patient is likely to be discharged from the hospital today cardiology service and needs follow-up with me.  I saw her for inpatient consult last week and did an upper endoscopy, discovering a gastric ulcer and a positive H. pylori antibody. Note from 10/15/2021 indicates patient has been given an appointment with me on January 18, which will be fine.  I have communicated with the inpatient team so they can coordinate starting patient's antibiotic treatment for H. pylori with her discharge instructions.  Please call this patient tomorrow the next day (since she should be discharging home by then) to arrange an upper endoscopy and a colonoscopy for gastric ulcer and iron deficiency anemia.  Remind her of the clinic date and time as well.  (All instructions for procedure and prep can be given at the January clinic visit) If we have to change the date for procedures later for what ever reason, we will do so.  But I would prefer to hold the dates now to have the necessary timing.  When she sees me in the office I can then communicate with her cardiologist about being able to hold the Eliquis for those procedures.  She recently had a cardioversion for atrial fibrillation, so needs to stay on Eliquis for at least 4 weeks afterward.  Thank you  HD

## 2021-10-17 NOTE — Progress Notes (Signed)
Progress Note  Patient Name: Brittney Walker Date of Encounter: 10/17/2021  Primary Cardiologist: Croitoru   Subjective   Seems hesitant to go home but clinically ready   Inpatient Medications    Scheduled Meds:  apixaban  5 mg Oral BID   ferrous sulfate  325 mg Oral BID WC   furosemide  80 mg Intravenous BID   pantoprazole  40 mg Oral BID AC   potassium chloride SA  40 mEq Oral Daily   sodium chloride flush  3 mL Intravenous Q12H   vitamin B-12  1,000 mcg Oral Daily   Continuous Infusions:  sodium chloride Stopped (10/12/21 1824)   PRN Meds: sodium chloride, acetaminophen, benzonatate, guaiFENesin, ondansetron (ZOFRAN) IV, sodium chloride flush   Vital Signs    Vitals:   10/16/21 2012 10/17/21 0015 10/17/21 0433 10/17/21 0808  BP: (!) 106/52 (!) 106/47 (!) 121/51 (!) 118/53  Pulse: 72 71 66 68  Resp: 20 18 17 20   Temp: 97.8 F (36.6 C) 98.7 F (37.1 C) 97.7 F (36.5 C) 98 F (36.7 C)  TempSrc: Oral Oral Oral Oral  SpO2: 100% 98% 97% 100%  Weight:   103.1 kg   Height:        Intake/Output Summary (Last 24 hours) at 10/17/2021 0912 Last data filed at 10/17/2021 0810 Gross per 24 hour  Intake 1200 ml  Output 4600 ml  Net -3400 ml   Filed Weights   10/15/21 0324 10/16/21 0613 10/17/21 0433  Weight: 107.1 kg 104.2 kg 103.1 kg    Telemetry    Sinus rhythm - Personally Reviewed  ECG    None today - Personally Reviewed  Physical Exam   GEN: No acute distress.   Neck: No JVD Cardiac: SWF,0/9 holosystolic murmurs, rubs, or gallops.  Respiratory: Clear to auscultation bilaterally. GI: Soft, nontender, non-distended  Plus 2 bilateral edema less tense  Neuro:  Nonfocal  Psych: Normal affect   Labs    Chemistry Recent Labs  Lab 10/13/21 0202 10/14/21 0232 10/15/21 0212  NA 136 134* 137  K 3.5 3.4* 3.7  CL 98 97* 97*  CO2 30 29 32  GLUCOSE 97 132* 95  BUN 10 11 13   CREATININE 0.92 0.88 1.03*  CALCIUM 8.4* 8.4* 8.6*  GFRNONAA >60 >60  58*  ANIONGAP 8 8 8      Hematology Recent Labs  Lab 10/15/21 0212 10/16/21 0244 10/17/21 0136  WBC 4.8 5.2 4.6  RBC 3.43* 3.41* 3.62*  HGB 9.5* 9.3* 10.1*  HCT 31.0* 30.4* 32.5*  MCV 90.4 89.1 89.8  MCH 27.7 27.3 27.9  MCHC 30.6 30.6 31.1  RDW 18.6* 18.9* 18.8*  PLT 232 217 230    Cardiac EnzymesNo results for input(s): TROPONINI in the last 168 hours. No results for input(s): TROPIPOC in the last 168 hours.   BNP Recent Labs  Lab 10/11/21 0626  BNP 460.6*     DDimer No results for input(s): DDIMER in the last 168 hours.   Radiology    No results found.  Cardiac Studies   TTE 10/11/2021 IMPRESSIONS   1. The RV is severely enlarged with mildly-to-moderately reduced systolic function (TAPSE 1.5, S' 8.4 cm/s). There is flattening of the interventricular septum in systole and diastole, consistent with RV  pressure and volume overload. IVC not well visualized. PASP is 76mmHg + RAP consistent with at least moderate (more  likely severe) pulmonary hypertension.   2. Left ventricular ejection fraction, by estimation, is 60 to 65%. The  left  ventricle has normal function. The left ventricle has no regional  wall motion abnormalities. There is mild concentric left ventricular  hypertrophy. Left ventricular diastolic  parameters were normal. There is the interventricular septum is flattened  in systole and diastole, consistent with right ventricular pressure and  volume overload.   3. Left atrial size was mildly dilated.   4. Right atrial size was severely dilated.   5. The mitral valve is normal in structure. Mild to moderate mitral valve  regurgitation.   6. Tricuspid valve regurgitation is severe.   7. The aortic valve is calcified. There is mild thickening of the aortic  valve. Aortic valve regurgitation is not visualized. Aortic valve  sclerosis is present, with no evidence of aortic valve stenosis.   Comparison(s): No prior Echocardiogram.   FINDINGS   Left  Ventricle: Left ventricular ejection fraction, by estimation, is 60  to 65%. The left ventricle has normal function. The left ventricle has no  regional wall motion abnormalities. The left ventricular internal cavity  size was normal in size. There is   mild concentric left ventricular hypertrophy. The interventricular septum  is flattened in systole and diastole, consistent with right ventricular  pressure and volume overload. Left ventricular diastolic parameters were  normal.   Right Ventricle: The right ventricular size is severely enlarged. Right  vetricular wall thickness was not well visualized. Right ventricular  systolic function is mildly reduced. There is moderately elevated  pulmonary artery systolic pressure. The  tricuspid regurgitant velocity is 3.35 m/s, and with an assumed right  atrial pressure of 8 mmHg, the estimated right ventricular systolic  pressure is 34.9 mmHg.   Left Atrium: Left atrial size was mildly dilated.   Right Atrium: Right atrial size was severely dilated.   Pericardium: There is no evidence of pericardial effusion.   Mitral Valve: The mitral valve is normal in structure. Mild mitral annular  calcification. Mild to moderate mitral valve regurgitation.   Tricuspid Valve: The tricuspid valve is normal in structure. Tricuspid  valve regurgitation is severe.   Aortic Valve: The aortic valve is calcified. There is mild thickening of  the aortic valve. Aortic valve regurgitation is not visualized. Aortic  valve sclerosis is present, with no evidence of aortic valve stenosis.   Pulmonic Valve: The pulmonic valve was not well visualized. Pulmonic valve  regurgitation is trivial.   Aorta: The aortic root and ascending aorta are structurally normal, with  no evidence of dilitation.   Venous: The inferior vena cava was not well visualized.   IAS/Shunts: There is left bowing of the interatrial septum, suggestive of  elevated right atrial pressure. No  atrial level shunt detected by color  flow Doppler.   Patient Profile     70 y.o. female with history of persistent atrial fibrillation status post cardioversion recently, now with symptomatic anemia on Eliquis, hypothyroidism status post radioactive iodine and obesity.  Assessment & Plan    Paroxysmal atrial fibrillation status post cardioversion, 10/11/21 continue anticoagulation maintaining NSR   Acute exacerbation of Diastolic heart failure -multifactorial given with tricuspid regurgitation with moderate pulmonary hypertension, mild to moderate mitral regurgitation.  Good diuresis  another 3.4 L's  liters yesterday K/Cr stable  D/c home with lasix 60 mg PO bid and  And Kdur 20 bid Repeat labs in 2 weeks BMET/BNP on TOC appointment  Moderate Pulmonary Hypertension-suspected likely secondary to her severe tricuspid regurgitation as well as potential sleep apnea.  For now we will diurese the patient and may  eventually need a right heart catheterization which does not necessarily need to be in the inpatient setting    Symptomatic anemia s/p EGD  with noted evidence of gastric ulcer.  We will continue PPIs with Protonix 40 mg twice daily.  Appreciate GI evaluating the patient in performing endoscopy.  Hct stable 30.4 H pylori positive Would get recommendations for Rx from Dr Pincus Sanes who did her EGD   Morbid obesity-outpatient referral to healthy weight and wellness.   D/c home  F/U Dr C F/U Dr Lorrin Goodell     Signed, Jenkins Rouge, MD  10/17/2021, 9:12 AM

## 2021-10-17 NOTE — TOC Progression Note (Signed)
Transition of Care [facility]) - Progression Note    Patient Details  Name: ZAYLEIGH STROH MRN: [id number] Date of Birth: 07-Dec-1950  Transition of Care [facility]) CM/SW [place], RN Phone [phone]  10/17/2021, 9:29 AM  Clinical Narrative:    CM received notification from [employer] that patient only has medicare part A and is not eligible for DME through medicare. CM called patient to update her and patient states that she has already spoken with DME company and has plans to rent needed DME. Patient states she will be calling to provide card info for charges. No other needs identified at this time. TOC will continue to follow.      Barriers to Discharge: Continued Medical Work up  Expected Discharge Plan and Services                           DME Arranged: 3-N-1, Hospital bed, Walker rolling with seat DME Agency: [employer] Date DME Agency Contacted: 10/16/21 Time DME Agency Contacted: 1218 Representative spoke with at DME Agency: [place]: PT, OT [place] Agency: [facility] Date [place]: 10/16/21 Time [place]: 1219 Representative spoke with at [place]: [place] ([place]) Interventions    Readmission Risk Interventions No flowsheet data found.

## 2021-10-18 ENCOUNTER — Other Ambulatory Visit: Payer: Self-pay

## 2021-10-18 DIAGNOSIS — D509 Iron deficiency anemia, unspecified: Secondary | ICD-10-CM

## 2021-10-18 DIAGNOSIS — K259 Gastric ulcer, unspecified as acute or chronic, without hemorrhage or perforation: Secondary | ICD-10-CM

## 2021-10-18 NOTE — Telephone Encounter (Signed)
Patient has been scheduled for an EGD and colonoscopy at [facility] on Tuesday, 12/25/21 at 8:30 am. Pt will need to arrive at [facility] by 7 am with a care partner. Pt is aware of her follow up appt with Dr. Loletha Carrow on 11/21/21 at 10:40 am. Pt has been advised to arrive on the 3rd floor 10 minutes early for registration. I have provided pt with the office address and phone number. Pt is aware that she will receive her procedure instructions at the time of her office visit. Pt verbalized understanding and had no concerns at the end of the call.

## 2021-10-25 ENCOUNTER — Encounter: Payer: Self-pay | Admitting: Cardiovascular Disease

## 2021-10-25 ENCOUNTER — Other Ambulatory Visit: Payer: Self-pay

## 2021-10-25 ENCOUNTER — Ambulatory Visit (INDEPENDENT_AMBULATORY_CARE_PROVIDER_SITE_OTHER): Payer: Self-pay | Admitting: Cardiovascular Disease

## 2021-10-25 VITALS — BP 118/68 | HR 67 | Ht 66.0 in | Wt 227.0 lb

## 2021-10-25 DIAGNOSIS — I4819 Other persistent atrial fibrillation: Secondary | ICD-10-CM

## 2021-10-25 NOTE — Patient Instructions (Signed)

## 2021-10-25 NOTE — Progress Notes (Signed)
Cardiology Office Note:    Date:  10/25/2021   ID:  Brittney Walker, DOB 1951-06-28, MRN [id number]  PCP:  Colon Branch, MD   [facility] Providers Cardiologist:  Sanda Klein, MD     Referring MD: Colon Branch, MD   Chief Complaint  Patient presents with   Atrial Fibrillation    History of Present Illness:    Brittney Walker is a 70 y.o. female with a hx of recently diagnosed persistent atrial fibrillation for which she underwent electrical cardioversion on 10/11/2021.  Following the cardioversion she was noted to be severely anemic and underwent upper endoscopy that showed a large gastric ulcer, H. pylori positive her echocardiogram showed dilated right heart chambers without evidence of left to right shunt mild-moderate reduced right ventricular systolic function, severely dilated right atrium and severe tricuspid regurgitation.  Labs were consistent with iron deficiency anemia.  She has had lower extremity edema and shortness of breath for a long time, both of them worsening in the last 5 or 6 months.  She was seen for this in the emergency department in September and that is when atrial fibrillation was initially diagnosed.  She has been reluctant to undergo additional evaluation due to a lapse in insurance coverage.  This is to be reinstated after the first of the year.  Heart failure signs and symptoms improved with diuretics.  She received a of 3 units PRBC blood transfusion for her anemia.  She has been compliant with anticoagulation with Eliquis.  She is completing treatment with doxycycline/metronidazole/Pepto-Bismol for H. pylori.,  Also taking proton pump inhibitor.  She does not have any dyspeptic symptoms at this time.  She does not know if she snores.  She lives alone.  She does not really describe a lot of daytime hypersomnolence, but sleep apnea is considered to be a likely underlying cause for her right heart dilatation and atrial fibrillation.  She has not undergone  a sleep study to date.  She was enrolled in the alleviate HF trial and has an implantable loop recorder that can also be used to monitor her rhythm abnormalities.  Past Medical History:  Diagnosis Date   Carotid bruit    w/ neg dopplers    CHF (congestive heart failure) ([place])    Hyperthyroidism     s/p RAI 2004   Normocytic anemia    Persistent atrial fibrillation ([place])    a. Dx 07/2021; b. CHA2DS2VASc = 3-->Eliquis; c. 10/2021 s/p DCCV.   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   Vitamin D deficiency     Past Surgical History:  Procedure Laterality Date   ABDOMINAL HYSTERECTOMY  1997   TAH-LEIOMYOMATA-- no BSO   BIOPSY THYROID      bx of largest nodule 2005, benign   BREAST SURGERY     x's 2--fibrocystic dz   CARDIOVERSION N/A 10/10/2021   Procedure: CARDIOVERSION;  Surgeon: Sanda Klein, MD;  Location: [place];  Service: Cardiovascular;  Laterality: N/A;   ESOPHAGOGASTRODUODENOSCOPY (EGD) WITH PROPOFOL N/A 10/12/2021   Procedure: ESOPHAGOGASTRODUODENOSCOPY (EGD) WITH PROPOFOL;  Surgeon: Doran Stabler, MD;  Location: [place];  Service: Gastroenterology;  Laterality: N/A;   TONSILLECTOMY      Current Medications: Current Meds  Medication Sig   apixaban (ELIQUIS) 5 MG TABS tablet Take 1 tablet (5 mg total) by mouth 2 (two) times daily.   bismuth subsalicylate (PEPTO BISMOL) 262 MG chewable tablet Chew 2 tablets (524 mg total) by mouth in the morning,  at noon, in the evening, and at bedtime for 14 days.   calcium carbonate (OS-CAL - DOSED IN MG OF ELEMENTAL CALCIUM) 1250 MG tablet Take 1,250 mg by mouth daily with breakfast.   cholecalciferol (VITAMIN D) 1000 UNITS tablet Take 1,000 Units by mouth daily.   doxycycline (VIBRA-TABS) 100 MG tablet Take 1 tablet (100 mg total) by mouth 2 (two) times daily.   ferrous sulfate 325 (65 FE) MG tablet Take 1 tablet (325 mg total) by mouth 2 (two) times daily with a meal.   furosemide (LASIX) 40 MG tablet  Take 1.5 tablets (60 mg total) by mouth 2 (two) times daily.   metroNIDAZOLE (FLAGYL) 500 MG tablet Take 1 tablet (500 mg total) by mouth 3 (three) times daily for 14 days.   Multiple Vitamin (MULTIVITAMIN WITH MINERALS) TABS tablet Take 1 tablet by mouth in the morning.   pantoprazole (PROTONIX) 40 MG tablet Take 1 tablet (40 mg total) by mouth 2 (two) times daily before a meal.   polyvinyl alcohol (LIQUIFILM TEARS) 1.4 % ophthalmic solution Place 1 drop into both eyes as needed for dry eyes.   potassium chloride SA (KLOR-CON M) 20 MEQ tablet Take 1 tablet (20 mEq total) by mouth 2 (two) times daily.   vitamin A 10000 UNIT capsule Take 10,000 Units by mouth daily.   vitamin B-12 (CYANOCOBALAMIN) 1000 MCG tablet Take 1,000 mcg by mouth daily.   vitamin C (ASCORBIC ACID) 500 MG tablet Take 1,000 mg by mouth daily.   vitamin E 400 UNIT capsule Take 400 Units by mouth daily.     Allergies:   Patient has no known allergies.   Social History   Socioeconomic History   Marital status: Single    Spouse name: Not on file   Number of children: 0   Years of education: Not on file   Highest education level: Not on file  Occupational History   Occupation: collections   Tobacco Use   Smoking status: Never   Smokeless tobacco: Never  Vaping Use   Vaping Use: Never used  Substance and Sexual Activity   Alcohol use: No    Alcohol/week: 0.0 standard drinks   Drug use: No   Sexual activity: Never    Comment: [place]  Other Topics Concern   Not on file  Social History Narrative   Lives by herself    Social Determinants of Health   Financial Resource Strain: Not on file  Food Insecurity: Not on file  Transportation Needs: Not on file  Physical Activity: Not on file  Stress: Not on file  Social Connections: Not on file     Family History: The patient's family history includes Breast cancer (age of onset: 74) in her sister; Heart attack in her father; Heart disease in her mother;  Hypertension in her father and mother; Seizures in her father. There is no history of Colon cancer, Pancreatic cancer, Esophageal cancer, Prostate cancer, or Rectal cancer.  ROS:   Please see the history of present illness.     All other systems reviewed and are negative.  EKGs/Labs/Other Studies Reviewed:    The following studies were reviewed today:   1. The RV is severely enlarged with mildly-to-moderately reduced systolic  function (TAPSE 1.5, S' 8.4 cm/s). There is flattening of the  interventricular septum in systole and diastole, consistent with RV  pressure and volume overload. IVC not well  visualized. PASP is 89mmHg + RAP consistent with at least moderate (more  likely severe) pulmonary  hypertension.   2. Left ventricular ejection fraction, by estimation, is 60 to 65%. The  left ventricle has normal function. The left ventricle has no regional  wall motion abnormalities. There is mild concentric left ventricular  hypertrophy. Left ventricular diastolic  parameters were normal. There is the interventricular septum is flattened  in systole and diastole, consistent with right ventricular pressure and  volume overload.   3. Left atrial size was mildly dilated.   4. Right atrial size was severely dilated.   5. The mitral valve is normal in structure. Mild to moderate mitral valve  regurgitation.   6. Tricuspid valve regurgitation is severe.   7. The aortic valve is calcified. There is mild thickening of the aortic  valve. Aortic valve regurgitation is not visualized. Aortic valve  sclerosis is present, with no evidence of aortic valve stenosis.   Comparison(s): No prior Echocardiogram.   EKG:  EKG is ordered today.  The ekg ordered today demonstrates SR with a single PAC, left axis deviation due to left anterior fascicular block and poor R progression    Recent Labs: 07/17/2021: ALT 32 09/04/2021: TSH 2.907 10/11/2021: B Natriuretic Peptide 460.6 10/15/2021: BUN 13;  Creatinine, Ser 1.03; Potassium 3.7; Sodium 137 10/16/2021: Magnesium 2.2 10/17/2021: Hemoglobin 10.1; Platelets 230  Recent Lipid Panel    Component Value Date/Time   CHOL 183 06/24/2017 1641   TRIG 59.0 06/24/2017 1641   HDL 51.30 06/24/2017 1641   CHOLHDL 4 06/24/2017 1641   VLDL 11.8 06/24/2017 1641   LDLCALC 120 (H) 06/24/2017 1641   LDLDIRECT 141.1 05/01/2009 0947     Risk Assessment/Calculations:    CHA2DS2-VASc Score = 4   This indicates a 4.8% annual risk of stroke. The patient's score is based upon: CHF History: 1 HTN History: 0 Diabetes History: 0 Stroke History: 0 Vascular Disease History: 1 (aortic atherosclerosis) Age Score: 1 Gender Score: 1          Physical Exam:    VS:  BP 118/68    Pulse 67    Ht 5\' 6"  (1.676 m)    Wt 227 lb (103 kg)    LMP 11/05/1995    SpO2 97%    BMI 36.64 kg/m     Wt Readings from Last 3 Encounters:  10/25/21 227 lb (103 kg)  10/17/21 227 lb 6.4 oz (103.1 kg)  10/10/21 278 lb 3.5 oz (126.2 kg)     GEN: Obese,  Well nourished, well developed in no acute distress HEENT: Normal NECK: No JVD; No carotid bruits LYMPHATICS: No lymphadenopathy CARDIAC: RRR, no murmurs, rubs, gallops RESPIRATORY:  Clear to auscultation without rales, wheezing or rhonchi  ABDOMEN: Soft, non-tender, non-distended MUSCULOSKELETAL:  No edema; No deformity  SKIN: Warm and dry NEUROLOGIC:  Alert and oriented x 3 PSYCHIATRIC:  Normal affect   ASSESSMENT:    1. Persistent atrial fibrillation (HCC)    PLAN:    In order of problems listed above:  Persistent atrial fibrillation: Successfully maintaining sinus rhythm roughly 2 weeks following elective cardioversion.  Not on antiarrhythmics.  Compliant with apixaban.  Pointed out that she should not interrupt the Eliquis for anything other than a serious life-threatening bleeding complications for 30 days after the cardioversion.  Brief interruption is thereafter are okay if indicated for bleeding  complications or surgical procedures.  Underlying cause for atrial fibrillation appears to be right heart dilation.  No history of thromboembolic disease.  Although she does not have prominent daytime hypersomnolence, untreated obstructive sleep apnea appears  to be the most likely cause. Chronic right heart failure: Pulmonary artery pressure was abnormal, but was not estimated to be 20 elevated .  It is possible that she has tricuspid regurgitation and has a primary valvular abnormality, causing right heart enlargement.  Continue diuretics.  Recheck an echocardiogram after several months in normal sinus rhythm. Loop recorder: Site is healing nicely.  Steri-Strips are still in place but should start coming off any day now.  Enrolled in [employer]-HF. Iron deficiency anemia: Worsening and compounding the findings of right heart failure.  Largely corrected following transfusion and administration of iron.  Needs to continue anticoagulation. Gastric ulcer, H. pylori positive: She has almost completed the triple antibiotic therapy. Mild obesity: Increases likelihood of arrhythmia recurrence and possibility that she has obstructive sleep apnea.  Would like to schedule outpatient sleep study once she has full insurance coverage.           Medication Adjustments/Labs and Tests Ordered: Current medicines are reviewed at length with the patient today.  Concerns regarding medicines are outlined above.  Orders Placed This Encounter  Procedures   EKG 12-Lead   No orders of the defined types were placed in this encounter.   Patient Instructions  Medication Instructions:  No changes *If you need a refill on your cardiac medications before your next appointment, please call your pharmacy*   Lab Work: None ordered If you have labs (blood work) drawn today and your tests are completely normal, you will receive your results only by: [place] (if you have MyChart) OR A paper copy in the mail If  you have any lab test that is abnormal or we need to change your treatment, we will call you to review the results.   Testing/Procedures: None ordered   Follow-Up: At [facility], you and your health needs are our priority.  As part of our continuing mission to provide you with exceptional heart care, we have created designated Provider Care Teams.  These Care Teams include your primary Cardiologist (physician) and Advanced Practice Providers (APPs -  Physician Assistants and Nurse Practitioners) who all work together to provide you with the care you need, when you need it.  We recommend signing up for the patient portal called "MyChart".  Sign up information is provided on this After Visit Summary.  MyChart is used to connect with patients for Virtual Visits (Telemedicine).  Patients are able to view lab/test results, encounter notes, upcoming appointments, etc.  Non-urgent messages can be sent to your provider as well.   To learn more about what you can do with MyChart, go to [email or web address].    Your next appointment:   6 month(s)  The format for your next appointment:   In Person  Provider:   Sanda Klein, MD   Signed, Sanda Klein, MD  10/25/2021 2:11 PM    Brittney Walker

## 2021-10-30 ENCOUNTER — Telehealth: Payer: Self-pay | Admitting: Cardiovascular Disease

## 2021-10-30 NOTE — Telephone Encounter (Signed)
Attempted to reach the patient. Was unable to get the call to go through.

## 2021-10-30 NOTE — Telephone Encounter (Signed)
New Message:     Patient called and said her Occupational Therapist told her to call and see if we had a blood pressure kit. She said the patient needs to take her blood pressure and pulse ever day.

## 2021-10-30 NOTE — Telephone Encounter (Signed)
Spoke with patient who is requesting a prescription for a blood pressure monitor. She stated that her Occupational Therapist recommended for her to check her blood pressure daily. I explained taking a blood pressure procedure to her and that instructions would be in the monitor packaging. She voiced understanding and would like a prescription for a BP monitor.

## 2021-11-02 ENCOUNTER — Telehealth: Payer: Self-pay | Admitting: Internal Medicine

## 2021-11-02 NOTE — Telephone Encounter (Signed)
[facility]: [place]: [phone]  Update on PT: Had [place] OT eval today and doesn't require any additional visit.

## 2021-11-02 NOTE — Telephone Encounter (Signed)
Noted- Pt hasn't been seen in our office since 2020.

## 2021-11-06 NOTE — Telephone Encounter (Signed)
Blood pressure cuff has been left for the patient at the front desk. She will come tomorrow to pick it up.

## 2021-11-08 ENCOUNTER — Ambulatory Visit (HOSPITAL_COMMUNITY)
Admission: RE | Admit: 2021-11-08 | Discharge: 2021-11-08 | Disposition: A | Discharge status: Home / Self Care | Payer: Medicaid Other | Source: Ambulatory Visit | Attending: Physician Assistant | Admitting: Physician Assistant

## 2021-11-08 ENCOUNTER — Other Ambulatory Visit: Payer: Self-pay

## 2021-11-08 DIAGNOSIS — R9431 Abnormal electrocardiogram [ECG] [EKG]: Secondary | ICD-10-CM | POA: Diagnosis not present

## 2021-11-08 DIAGNOSIS — I081 Rheumatic disorders of both mitral and tricuspid valves: Secondary | ICD-10-CM | POA: Insufficient documentation

## 2021-11-08 DIAGNOSIS — I272 Pulmonary hypertension, unspecified: Secondary | ICD-10-CM | POA: Diagnosis not present

## 2021-11-08 DIAGNOSIS — I509 Heart failure, unspecified: Secondary | ICD-10-CM | POA: Diagnosis not present

## 2021-11-08 DIAGNOSIS — I4819 Other persistent atrial fibrillation: Secondary | ICD-10-CM

## 2021-11-08 LAB — ECHOCARDIOGRAM COMPLETE
Area-P 1/2: 5.07 cm2
Calc EF: 65.3 %
S' Lateral: 3.5 cm
Single Plane A2C EF: 71.6 %
Single Plane A4C EF: 57.5 %

## 2021-11-08 NOTE — Progress Notes (Signed)
°  Echocardiogram 2D Echocardiogram has been performed.  Brittney Walker 11/08/2021, 4:01 PM

## 2021-11-13 ENCOUNTER — Telehealth: Payer: Self-pay | Admitting: *Deleted

## 2021-11-13 NOTE — Telephone Encounter (Signed)
Transition Care Management Follow-up Telephone Call Date of discharge and from where:   10/17/21  [facility] How have you been since you were released from the hospital?"Doing well" Any questions or concerns? No  Items Reviewed: Did the pt receive and understand the discharge instructions provided? Yes  Medications obtained and verified? Yes  Other? No  Any new allergies since your discharge? No  Dietary orders reviewed? Yes Do you have support at home? Yes   Has family can call on if needed,  has home health service at present  [facility] and Equipment/Supplies: Were home health services ordered? yes If so, what is the name of the agency?  [facility] Has the agency set up a time to come to the patient's home? yes Were any new equipment or medical supplies ordered?  No What is the name of the medical supply agency? N/A Were you able to get the supplies/equipment? not applicable Do you have any questions related to the use of the equipment or supplies? N/a  Functional Questionnaire: (I = Independent and D = Dependent) ADLs: I  Bathing/Dressing- I  Meal Prep- I   Eating- I  Maintaining continence- I  Transferring/Ambulation- I  Managing Meds- I  Follow up appointments reviewed:  PCP Hospital f/u appt confirmed? No   Pt waiting for insurance to be in effect on Feb. 1 and plans to call today to schedule appointment [facility] f/u appt confirmed? Yes  Saw on 10/25/21 Are transportation arrangements needed? No  If their condition worsens, is the pt aware to call PCP or go to the Emergency Dept.? Yes Was the patient provided with contact information for the PCP's office or ED? Yes Was to pt encouraged to call back with questions or concerns? Yes   Jacqlyn Larsen [facility], BSN RN Case Manager [phone]

## 2021-11-14 ENCOUNTER — Telehealth: Payer: Self-pay | Admitting: Internal Medicine

## 2021-11-14 NOTE — Telephone Encounter (Signed)
Yes, thank you.

## 2021-11-14 NOTE — Telephone Encounter (Signed)
Patient would like to know if Dr. Larose Kells would consider letting her re-establish care with him. She has not been here for over 3 years but would like like to come back to get her overall health checked. She stated Dr. Olive Bass is the one that told her to go back to Dr. Larose Kells. Is it ok to make an appointment?

## 2021-11-15 NOTE — Telephone Encounter (Signed)
Patient called back and was scheduled to see Dr Larose Kells on 12-12-21 as NP to re-establish care NP package sent to patient.

## 2021-11-21 ENCOUNTER — Telehealth: Payer: Self-pay

## 2021-11-21 ENCOUNTER — Ambulatory Visit (INDEPENDENT_AMBULATORY_CARE_PROVIDER_SITE_OTHER): Payer: Self-pay | Admitting: Gastroenterology

## 2021-11-21 ENCOUNTER — Other Ambulatory Visit (INDEPENDENT_AMBULATORY_CARE_PROVIDER_SITE_OTHER): Payer: Self-pay

## 2021-11-21 ENCOUNTER — Encounter: Payer: Self-pay | Admitting: Gastroenterology

## 2021-11-21 ENCOUNTER — Telehealth: Payer: Self-pay | Admitting: Gastroenterology

## 2021-11-21 VITALS — BP 102/60 | HR 59 | Ht 66.0 in | Wt 213.0 lb

## 2021-11-21 DIAGNOSIS — K254 Chronic or unspecified gastric ulcer with hemorrhage: Secondary | ICD-10-CM

## 2021-11-21 DIAGNOSIS — D500 Iron deficiency anemia secondary to blood loss (chronic): Secondary | ICD-10-CM

## 2021-11-21 DIAGNOSIS — A048 Other specified bacterial intestinal infections: Secondary | ICD-10-CM

## 2021-11-21 DIAGNOSIS — I700 Atherosclerosis of aorta: Secondary | ICD-10-CM | POA: Insufficient documentation

## 2021-11-21 LAB — CBC WITH DIFFERENTIAL/PLATELET
Basophils Absolute: 0 10*3/uL (ref 0.0–0.1)
Basophils Relative: 0.6 % (ref 0.0–3.0)
Eosinophils Absolute: 0.3 10*3/uL (ref 0.0–0.7)
Eosinophils Relative: 6.2 % — ABNORMAL HIGH (ref 0.0–5.0)
HCT: 36.6 % (ref 36.0–46.0)
Hemoglobin: 11.9 g/dL — ABNORMAL LOW (ref 12.0–15.0)
Lymphocytes Relative: 21.6 % (ref 12.0–46.0)
Lymphs Abs: 1.1 10*3/uL (ref 0.7–4.0)
MCHC: 32.4 g/dL (ref 30.0–36.0)
MCV: 87.6 fl (ref 78.0–100.0)
Monocytes Absolute: 0.8 10*3/uL (ref 0.1–1.0)
Monocytes Relative: 16.1 % — ABNORMAL HIGH (ref 3.0–12.0)
Neutro Abs: 2.9 10*3/uL (ref 1.4–7.7)
Neutrophils Relative %: 55.5 % (ref 43.0–77.0)
Platelets: 129 10*3/uL — ABNORMAL LOW (ref 150.0–400.0)
RBC: 4.18 Mil/uL (ref 3.87–5.11)
RDW: 21.9 % — ABNORMAL HIGH (ref 11.5–15.5)
WBC: 5.2 10*3/uL (ref 4.0–10.5)

## 2021-11-21 LAB — IBC + FERRITIN
Ferritin: 49.9 ng/mL (ref 10.0–291.0)
Iron: 69 ug/dL (ref 42–145)
Saturation Ratios: 17 % — ABNORMAL LOW (ref 20.0–50.0)
TIBC: 406 ug/dL (ref 250.0–450.0)
Transferrin: 290 mg/dL (ref 212.0–360.0)

## 2021-11-21 MED ORDER — GOLYTELY 236 G PO SOLR: 4000.0000 mL | Freq: Once | ORAL | 0 refills | Status: AC

## 2021-11-21 MED ORDER — PANTOPRAZOLE SODIUM 40 MG PO TBEC: 40.0000 mg | DELAYED_RELEASE_TABLET | Freq: Two times a day (BID) | ORAL | 0 refills | Status: DC

## 2021-11-21 NOTE — Patient Instructions (Addendum)
If you are age 72 or older, your body mass index should be between 23-30. Your Body mass index is 34.38 kg/m. If this is out of the aforementioned range listed, please consider follow up with your Primary Care Provider.  If you are age 39 or younger, your body mass index should be between 19-25. Your Body mass index is 34.38 kg/m. If this is out of the aformentioned range listed, please consider follow up with your Primary Care Provider.   ________________________________________________________  The [facility] providers would like to encourage you to use [facility] to communicate with providers for non-urgent requests or questions.  Due to long hold times on the telephone, sending your provider a message by [facility] may be a faster and more efficient way to get a response.  Please allow 48 business hours for a response.  Please remember that this is for non-urgent requests.  _______________________________________________________  Dennis Bast have been scheduled for an endoscopy and colonoscopy. Please follow the written instructions given to you at your visit today. Please pick up your prep supplies at the pharmacy within the next 1-3 days. If you use inhalers (even only as needed), please bring them with you on the day of your procedure.  Your provider has requested that you go to the basement level for lab work before leaving today. Press "B" on the elevator. The lab is located at the first door on the left as you exit the elevator.  Lotrimin (clotrimazole) over the counter for the belly button drainage if it reoccurs.   You will be contacted by our office prior to your procedure for directions on holding your Eliquis.  If you do not hear from our office 1 week prior to your scheduled procedure, please call [phone] to discuss.     Due to recent changes in healthcare laws, you may see the results of your imaging and laboratory studies on MyChart before your provider has had a chance to review them.   We understand that in some cases there may be results that are confusing or concerning to you. Not all laboratory results come back in the same time frame and the provider may be waiting for multiple results in order to interpret others.  Please give [place] 48 hours in order for your provider to thoroughly review all the results before contacting the office for clarification of your results.   It was a pleasure to see you today!  Thank you for trusting me with your gastrointestinal care!

## 2021-11-21 NOTE — Telephone Encounter (Signed)
Dr. Loletha Carrow please advise on an alternate medication

## 2021-11-21 NOTE — Telephone Encounter (Addendum)
Patient with diagnosis of Afib on Eliquis for anticoagulation.    Procedure: EGD/Colonoscopy Date of procedure: 12/25/2021   CHA2DS2-VASc Score = 4   This indicates a 4.8% annual risk of stroke. The patient's score is based upon: CHF History: 1 HTN History: 0 Diabetes History: 0 Stroke History: 0 Vascular Disease History: 1 (aortic atherosclerosis) Age Score: 1 Gender Score: 1  Of note, patient underwent cardioversion on 10/11/2021.  CrCl 76 mL/min Platelet count 129k  Per office protocol, patient can hold Eliquis for 1 day prior to procedure.

## 2021-11-21 NOTE — Telephone Encounter (Signed)
Clinical pharmacist to review Eliquis 

## 2021-11-21 NOTE — Progress Notes (Signed)
[facility] H&P:  History: Brittney Walker 11/21/2021  Referring provider: Colon Branch, MD  Reason for consult/chief complaint: Anemia (Patient had blood work in Dec/ scheduled for procedure in Feb. She is currently take Eliquis)   Subjective  HPI: Brittney Walker is here to follow-up after a hospitalization last month.  She was scheduled for an outpatient A. fib cardioversion which was performed successfully, then preprocedure labs later returned and revealed severe iron deficiency anemia.  She was then admitted and maintained on oral anticoagulation due to the cardioversion.  Upper endoscopy done on [place] with findings as follows:  The esophagus was normal. Findings: One non-bleeding cratered gastric ulcer with no stigmata of bleeding was found at the incisura. The lesion was 12-15 mm in largest dimension. No active bleeding upon scope insertion. Brief, self-limited oozing from adjacent friable mucosa after lavage. The exam of the stomach was otherwise normal except for generalized mucosal edema. The cardia and gastric fundus were normal on retroflexion. The examined duodenum was normal.  Serum H. pylori IgG antibody was positive, and patient was treated at the time of discharge with bismuth based quadruple therapy and twice daily PPI as well as twice daily iron sulfate 325 mg.  Last hemoglobin 10.1 on discharge 10/17/2021, has not been checked since then.  Brittney Walker says she took everything prescribed after discharge, but is no longer on pantoprazole.  She cannot recall when she stopped it.  She has continued iron twice daily, stool is dark but not black and tarry, no hematemesis vomiting or altered bowel habits.  Brittney Walker is currently scheduled for an EGD and colonoscopy with me (for iron deficiency anemia and follow-up of gastric ulcer) in the [place] endoscopy lab on 12/25/2021. ROS:  Review of Systems Fatigue, intermittent dyspnea, denies chest pain Remainder  systems negative except as above  Past Medical History: Past Medical History:  Diagnosis Date   Carotid bruit    w/ neg dopplers    CHF (congestive heart failure) ([place])    Hyperthyroidism     s/p RAI 2004   Normocytic anemia    Persistent atrial fibrillation ([place])    a. Dx 07/2021; b. CHA2DS2VASc = 3-->Eliquis; c. 10/2021 s/p DCCV.   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 Sallyanne Kuster reviewed, indicates patient's cardiac status stable and volume overload improved.  Past Surgical History: Past Surgical History:  Procedure Laterality Date   ABDOMINAL HYSTERECTOMY  1997   TAH-LEIOMYOMATA-- no BSO   BIOPSY THYROID      bx of largest nodule 2005, benign   BREAST SURGERY     x's 2--fibrocystic dz   CARDIOVERSION N/A 10/10/2021   Procedure: CARDIOVERSION;  Surgeon: Sanda Klein, MD;  Location: [facility];  Service: Cardiovascular;  Laterality: N/A;   ESOPHAGOGASTRODUODENOSCOPY (EGD) WITH PROPOFOL N/A 10/12/2021   Procedure: ESOPHAGOGASTRODUODENOSCOPY (EGD) WITH PROPOFOL;  Surgeon: Doran Stabler, MD;  Location: [facility];  Service: Gastroenterology;  Laterality: N/A;   TONSILLECTOMY       Family History: Family History  Problem Relation Age of Onset   Seizures Father        Epilepsy   Hypertension Father    Heart attack Father        ?   Breast cancer Sister 70   Heart disease Mother    Hypertension Mother    Colon cancer Neg Hx    Pancreatic cancer Neg Hx    Esophageal  cancer Neg Hx    Prostate cancer Neg Hx    Rectal cancer Neg Hx     Social History: Social History   Socioeconomic History   Marital status: Single    Spouse name: Not on file   Number of children: 0   Years of education: Not on file   Highest education level: Not on file  Occupational History   Occupation: collections   Tobacco Use   Smoking status: Never   Smokeless tobacco: Never  Vaping Use    Vaping Use: Never used  Substance and Sexual Activity   Alcohol use: No    Alcohol/week: 0.0 standard drinks   Drug use: No   Sexual activity: Never    Comment: [place]  Other Topics Concern   Not on file  Social History Narrative   Lives by herself    Social Determinants of Health   Financial Resource Strain: Not on file  Food Insecurity: Not on file  Transportation Needs: Not on file  Physical Activity: Not on file  Stress: Not on file  Social Connections: Not on file    Allergies: No Known Allergies  Outpatient Meds: Current Outpatient Medications  Medication Sig Dispense Refill   apixaban (ELIQUIS) 5 MG TABS tablet Take 1 tablet (5 mg total) by mouth 2 (two) times daily. 180 tablet 3   calcium carbonate (OS-CAL - DOSED IN MG OF ELEMENTAL CALCIUM) 1250 MG tablet Take 1,250 mg by mouth daily with breakfast.     cholecalciferol (VITAMIN D) 1000 UNITS tablet Take 1,000 Units by mouth daily.     doxycycline (VIBRA-TABS) 100 MG tablet Take 1 tablet (100 mg total) by mouth 2 (two) times daily. 28 tablet 0   ferrous sulfate 325 (65 FE) MG tablet Take 1 tablet (325 mg total) by mouth 2 (two) times daily with a meal. 60 tablet 3   furosemide (LASIX) 40 MG tablet Take 1.5 tablets (60 mg total) by mouth 2 (two) times daily. 90 tablet 11   Multiple Vitamin (MULTIVITAMIN WITH MINERALS) TABS tablet Take 1 tablet by mouth in the morning.     pantoprazole (PROTONIX) 40 MG tablet Take 1 tablet (40 mg total) by mouth 2 (two) times daily before a meal. 60 tablet 6   potassium chloride SA (KLOR-CON M) 20 MEQ tablet Take 1 tablet (20 mEq total) by mouth 2 (two) times daily. 60 tablet 6   vitamin A 10000 UNIT capsule Take 10,000 Units by mouth daily.     vitamin B-12 (CYANOCOBALAMIN) 1000 MCG tablet Take 1,000 mcg by mouth daily.     vitamin C (ASCORBIC ACID) 500 MG tablet Take 1,000 mg by mouth daily.     vitamin E 400 UNIT capsule Take 400 Units by mouth daily.     polyvinyl alcohol (LIQUIFILM  TEARS) 1.4 % ophthalmic solution Place 1 drop into both eyes as needed for dry eyes. (Patient not taking: Reported on 11/21/2021)     No current facility-administered medications for this visit.      ___________________________________________________________________ Objective   Exam:  BP 102/60    Pulse (!) 59    Ht 5\' 6"  (1.676 m)    Wt 213 lb (96.6 kg)    LMP 11/05/1995    SpO2 97%    BMI 34.38 kg/m  Wt Readings from Last 3 Encounters:  11/21/21 213 lb (96.6 kg)  10/25/21 227 lb (103 kg)  10/17/21 227 lb 6.4 oz (103.1 kg)    General: Well-appearing.  Weight down from  last cardiology visit (presumably diuresis) Eyes: sclera anicteric, no redness ENT: oral mucosa moist without lesions, no cervical or supraclavicular lymphadenopathy CV: Irregular with a soft systolic murmur M2/L0, no JVD, mild bilateral peripheral edema Resp: clear to auscultation bilaterally, normal RR and effort noted GI: soft, no tenderness, with active bowel sounds. No guarding or palpable organomegaly noted. Skin; warm and dry, no rash or jaundice noted Neuro: awake, alert and oriented x 3. Normal gross motor function and fluent speech  Labs:  CBC Latest Ref Rng & Units 10/17/2021 10/16/2021 10/15/2021  WBC 4.0 - 10.5 K/uL 4.6 5.2 4.8  Hemoglobin 12.0 - 15.0 g/dL 10.1(L) 9.3(L) 9.5(L)  Hematocrit 36.0 - 46.0 % 32.5(L) 30.4(L) 31.0(L)  Platelets 150 - 400 K/uL 230 217 232   CMP Latest Ref Rng & Units 10/15/2021 10/14/2021 10/13/2021  Glucose 70 - 99 mg/dL 95 132(H) 97  BUN 8 - 23 mg/dL 13 11 10   Creatinine 0.44 - 1.00 mg/dL 1.03(H) 0.88 0.92  Sodium 135 - 145 mmol/L 137 134(L) 136  Potassium 3.5 - 5.1 mmol/L 3.7 3.4(L) 3.5  Chloride 98 - 111 mmol/L 97(L) 97(L) 98  CO2 22 - 32 mmol/L 32 29 30  Calcium 8.9 - 10.3 mg/dL 8.6(L) 8.4(L) 8.4(L)  Total Protein 6.5 - 8.1 g/dL - - -  Total Bilirubin 0.3 - 1.2 mg/dL - - -  Alkaline Phos 38 - 126 U/L - - -  AST 15 - 41 U/L - - -  ALT 0 - 44 U/L - - -      Radiologic Studies:  Echocardiogram 11/08/2021 with LVEF 60%, moderate to severe tricuspid regurg and pulmonary hypertension (full report on file) -overall, similar findings to TEE findings at time of cardioversion.   Last colonoscopy by Dr. Deatra Ina February 2015, complete exam, good prep, no polyps.  Diverticulosis and internal hemorrhoids seen. Assessment: Encounter Diagnoses  Name Primary?   Iron deficiency anemia due to chronic blood loss Yes   H. pylori infection    Chronic gastric ulcer with hemorrhage     IDA apparently from gastric ulcer, most likely had chronic occult GI blood loss, then more overt bleeding in the few weeks leading up to her cardioversion with a 3 to 4 g hemoglobin drop in that time. 8 years since last colonoscopy, so must consider lower GI source of IDA, though seems less likely given EGD findings.  Not yet clear if ulcer will of healed since she was not kept on PPI long after hospital stay. Plan:  EGD and colonoscopy as scheduled.  Planned in the hospital outpatient endoscopy lab due to pulmonary hypertension and therefore increased risk of sedation related complications.  The benefits and risks of the planned procedure were described in detail with the patient or (when appropriate) their health care proxy.  Risks were outlined as including, but not limited to, bleeding, infection, perforation, adverse medication reaction leading to cardiac or pulmonary decompensation, pancreatitis (if ERCP).  The limitation of incomplete mucosal visualization was also discussed.  No guarantees or warranties were given. Patient at increased risk for cardiopulmonary complications of procedure due to medical comorbidities.   CBC and iron studies today, iron supplement management based on results.  Omeprazole 40 mg twice daily pantoprazole   Thank you for the courtesy of this consult.  Please call me with any questions or concerns.  Nelida Meuse III  CC: Referring  provider noted above

## 2021-11-21 NOTE — Telephone Encounter (Signed)
[facility] Pre-operative Risk Assessment     Request for surgical clearance:     Endoscopy Procedure  What type of surgery is being performed?     12-25-2021  When is this surgery scheduled?     EGD/Colonoscopy  What type of clearance is required ?   Pharmacy  Are there any medications that need to be held prior to surgery and how long? Yes, Eliquis, 1 day before procedure   Practice name and name of physician performing surgery?      [facility]  What is your office phone and fax number?      Phone- [phone]  [phone]  Anesthesia type (None, local, MAC, general) ?       MAC

## 2021-11-21 NOTE — Telephone Encounter (Signed)
Inbound call from patient stating pantoprazole is over $70 and is wanting to know if we have any samples.  Please advise.

## 2021-11-22 ENCOUNTER — Other Ambulatory Visit: Payer: Self-pay

## 2021-11-22 MED ORDER — OMEPRAZOLE 40 MG PO CPDR: 40.0000 mg | DELAYED_RELEASE_CAPSULE | Freq: Two times a day (BID) | ORAL | 0 refills | Status: DC

## 2021-11-22 NOTE — Telephone Encounter (Signed)
° °  Name: Brittney Walker  DOB: [birth year]  MRN: [id number]   Primary Cardiologist: Sanda Klein, MD  Chart reviewed as part of pre-operative protocol coverage. Patient was contacted 11/22/2021 in reference to pre-operative risk assessment for pending surgery as outlined below.  Brittney Walker was last seen on 10/25/2021 by Dr. Sallyanne Kuster.  Since that day, Brittney Walker has done well without exertional chest pain or worsening dyspnea.  Therefore, based on ACC/AHA guidelines, the patient would be at acceptable risk for the planned procedure without further cardiovascular testing.   Patient may hold Eliquis for 1 day prior to the procedure and restart as soon as possible afterward at her GI doctor's discretion.  Of note, patient also mentions she does not have any money to obtain pantoprazole as recommended by her GI doctor, please advise the patient an alternative PPI she can take.  The patient was advised that if she develops new symptoms prior to surgery to contact our office to arrange for a follow-up visit, and she verbalized understanding.  I will route this recommendation to the requesting party via Epic fax function and remove from pre-op pool. Please call with questions.  [place], [place] 11/22/2021, 2:40 PM

## 2021-11-22 NOTE — Telephone Encounter (Signed)
Thanks for the note.  Unfortunately, I have no way of knowing which PPI may be less expensive.  Please change to omeprazole 40 mg twice daily before breakfast and supper. Disp#60, RF zero  It is essential she take either that or pantoprazole to allow the stomach ulcer to heal.  Also, please tell her the hemoglobin and iron levels are much improved.   Continue the iron sulfate twice daily, then stop a week before the upcoming EGD and colonoscopy.  - HD

## 2021-11-22 NOTE — Telephone Encounter (Signed)
Patient is aware that we have sent in a new PPI. However after talking with her more. She actually doesn't have any insurance and wont have until February 2023. I have sent in the omperazole to see if this would be more cost effective for her

## 2021-11-29 NOTE — Telephone Encounter (Signed)
Patient has been notified and aware, she states clear understanding to hold 1 day before scheduled EGD/Colon.

## 2021-12-03 ENCOUNTER — Telehealth: Payer: Self-pay

## 2021-12-03 NOTE — Telephone Encounter (Signed)
Plan of care received by [place] has not been seen since 2019- unable to sign. Form refaxed to [employer].

## 2021-12-05 DIAGNOSIS — Z006 Encounter for examination for normal comparison and control in clinical research program: Secondary | ICD-10-CM

## 2021-12-05 NOTE — Research (Signed)
Alleviate HF   2 Month Follow-up  No adverse events or medication changes to report at this time.   Current Outpatient Medications:    apixaban (ELIQUIS) 5 MG TABS tablet, Take 1 tablet (5 mg total) by mouth 2 (two) times daily., Disp: 180 tablet, Rfl: 3   calcium carbonate (OS-CAL - DOSED IN MG OF ELEMENTAL CALCIUM) 1250 MG tablet, Take 1,250 mg by mouth daily with breakfast., Disp: , Rfl:    cholecalciferol (VITAMIN D) 1000 UNITS tablet, Take 1,000 Units by mouth daily., Disp: , Rfl:    doxycycline (VIBRA-TABS) 100 MG tablet, Take 1 tablet (100 mg total) by mouth 2 (two) times daily., Disp: 28 tablet, Rfl: 0   ferrous sulfate 325 (65 FE) MG tablet, Take 1 tablet (325 mg total) by mouth 2 (two) times daily with a meal., Disp: 60 tablet, Rfl: 3   furosemide (LASIX) 40 MG tablet, Take 1.5 tablets (60 mg total) by mouth 2 (two) times daily., Disp: 90 tablet, Rfl: 11   Multiple Vitamin (MULTIVITAMIN WITH MINERALS) TABS tablet, Take 1 tablet by mouth in the morning., Disp: , Rfl:    omeprazole (PRILOSEC) 40 MG capsule, Take 1 capsule (40 mg total) by mouth 2 (two) times daily., Disp: 60 capsule, Rfl: 0   polyvinyl alcohol (LIQUIFILM TEARS) 1.4 % ophthalmic solution, Place 1 drop into both eyes as needed for dry eyes. (Patient not taking: Reported on 11/21/2021), Disp: , Rfl:    potassium chloride SA (KLOR-CON M) 20 MEQ tablet, Take 1 tablet (20 mEq total) by mouth 2 (two) times daily., Disp: 60 tablet, Rfl: 6   vitamin A 10000 UNIT capsule, Take 10,000 Units by mouth daily., Disp: , Rfl:    vitamin B-12 (CYANOCOBALAMIN) 1000 MCG tablet, Take 1,000 mcg by mouth daily., Disp: , Rfl:    vitamin C (ASCORBIC ACID) 500 MG tablet, Take 1,000 mg by mouth daily., Disp: , Rfl:    vitamin E 400 UNIT capsule, Take 400 Units by mouth daily., Disp: , Rfl:

## 2021-12-07 DIAGNOSIS — I272 Pulmonary hypertension, unspecified: Secondary | ICD-10-CM | POA: Diagnosis not present

## 2021-12-07 DIAGNOSIS — E559 Vitamin D deficiency, unspecified: Secondary | ICD-10-CM | POA: Diagnosis not present

## 2021-12-07 DIAGNOSIS — I4819 Other persistent atrial fibrillation: Secondary | ICD-10-CM | POA: Diagnosis not present

## 2021-12-07 DIAGNOSIS — D649 Anemia, unspecified: Secondary | ICD-10-CM | POA: Diagnosis not present

## 2021-12-07 DIAGNOSIS — I5031 Acute diastolic (congestive) heart failure: Secondary | ICD-10-CM | POA: Diagnosis not present

## 2021-12-07 DIAGNOSIS — I083 Combined rheumatic disorders of mitral, aortic and tricuspid valves: Secondary | ICD-10-CM | POA: Diagnosis not present

## 2021-12-12 ENCOUNTER — Ambulatory Visit: Payer: Self-pay | Admitting: Internal Medicine

## 2021-12-12 ENCOUNTER — Telehealth: Payer: Self-pay | Admitting: Internal Medicine

## 2021-12-12 NOTE — Telephone Encounter (Signed)
[facility] called to state pt is being discharged and if [facility] will be required a new referral will have to be placed.

## 2021-12-13 DIAGNOSIS — Z1231 Encounter for screening mammogram for malignant neoplasm of breast: Secondary | ICD-10-CM | POA: Diagnosis not present

## 2021-12-13 LAB — HM MAMMOGRAPHY

## 2021-12-17 ENCOUNTER — Ambulatory Visit: Payer: Self-pay

## 2021-12-17 DIAGNOSIS — I4819 Other persistent atrial fibrillation: Secondary | ICD-10-CM

## 2021-12-21 ENCOUNTER — Encounter: Payer: Self-pay | Admitting: Internal Medicine

## 2021-12-21 ENCOUNTER — Ambulatory Visit (INDEPENDENT_AMBULATORY_CARE_PROVIDER_SITE_OTHER): Payer: Medicare Other | Admitting: Internal Medicine

## 2021-12-21 VITALS — BP 116/76 | HR 65 | Temp 98.3°F | Resp 18 | Ht 66.0 in | Wt 214.5 lb

## 2021-12-21 DIAGNOSIS — R739 Hyperglycemia, unspecified: Secondary | ICD-10-CM

## 2021-12-21 DIAGNOSIS — R269 Unspecified abnormalities of gait and mobility: Secondary | ICD-10-CM | POA: Diagnosis not present

## 2021-12-21 DIAGNOSIS — M1990 Unspecified osteoarthritis, unspecified site: Secondary | ICD-10-CM | POA: Diagnosis not present

## 2021-12-21 DIAGNOSIS — Z23 Encounter for immunization: Secondary | ICD-10-CM

## 2021-12-21 DIAGNOSIS — I480 Paroxysmal atrial fibrillation: Secondary | ICD-10-CM | POA: Diagnosis not present

## 2021-12-21 DIAGNOSIS — I5031 Acute diastolic (congestive) heart failure: Secondary | ICD-10-CM | POA: Diagnosis not present

## 2021-12-21 DIAGNOSIS — E042 Nontoxic multinodular goiter: Secondary | ICD-10-CM | POA: Diagnosis not present

## 2021-12-21 DIAGNOSIS — M1A9XX0 Chronic gout, unspecified, without tophus (tophi): Secondary | ICD-10-CM

## 2021-12-21 LAB — HEMOGLOBIN A1C: Hgb A1c MFr Bld: 5.6 % (ref 4.6–6.5)

## 2021-12-21 LAB — COMPREHENSIVE METABOLIC PANEL
ALT: 20 U/L (ref 0–35)
AST: 24 U/L (ref 0–37)
Albumin: 4.2 g/dL (ref 3.5–5.2)
Alkaline Phosphatase: 77 U/L (ref 39–117)
BUN: 23 mg/dL (ref 6–23)
CO2: 37 mEq/L — ABNORMAL HIGH (ref 19–32)
Calcium: 9.6 mg/dL (ref 8.4–10.5)
Chloride: 100 mEq/L (ref 96–112)
Creatinine, Ser: 0.82 mg/dL (ref 0.40–1.20)
GFR: 71.68 mL/min (ref >60.00)
Glucose, Bld: 88 mg/dL (ref 70–99)
Potassium: 3.6 mEq/L (ref 3.5–5.1)
Sodium: 140 mEq/L (ref 135–145)
Total Bilirubin: 0.6 mg/dL (ref 0.2–1.2)
Total Protein: 7.6 g/dL (ref 6.0–8.3)

## 2021-12-21 LAB — LIPID PANEL
Cholesterol: 180 mg/dL (ref 0–200)
HDL: 63.4 mg/dL (ref >39.00)
LDL Cholesterol: 105 mg/dL — ABNORMAL HIGH (ref 0–99)
NonHDL: 116.59
Total CHOL/HDL Ratio: 3
Triglycerides: 60 mg/dL (ref 0.0–149.0)
VLDL: 12 mg/dL (ref 0.0–40.0)

## 2021-12-21 LAB — URIC ACID: Uric Acid, Serum: 8.7 mg/dL — ABNORMAL HIGH (ref 2.4–7.0)

## 2021-12-21 NOTE — Progress Notes (Signed)
Subjective:    Patient ID: Brittney Walker, female    DOB: Jul 10, 1951, 71 y.o.   MRN: [id number]  DOS:  12/21/2021 Type of visit - description: Here to reestablish, last visit 2019:  Seen at the ER 07-2021. Seen with SOB, dizziness, leg swelling, chest pain. Dx with atrial fibrillation. Subsequently saw cardiology. She was also diagnosed with CHF. Started Eliquis Underwent electrical cardioversion 10/11/2021. Shortly after  the procedure noted to have severe anemia, admitted, an upper endoscopy showed gastric ulcer, H. pylori positive. Echo: dilated right heart chambers without evidence of left to right shunt mild-moderate reduced right ventricular systolic function, severely dilated right atrium and severe tricuspid regurgitation. Was discharged from the hospital 10/17/2021.   Review of Systems Since she left the hospital she is back at home. Good med compliance. Denies chest pain or difficulty breathing Occasionally has lower extremity edema No palpitations. No nausea.  No abdominal pain.  No blood in the stools.  No GERD. Gait and mobility are very limited.  Past Medical History:  Diagnosis Date   Carotid bruit    w/ neg dopplers    CHF (congestive heart failure) ([place])    Hyperthyroidism     s/p RAI 2004   Normocytic anemia    Persistent atrial fibrillation ([place])    a. Dx 07/2021; b. CHA2DS2VASc = 3-->Eliquis; c. 10/2021 s/p DCCV.   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   Vitamin D deficiency     Past Surgical History:  Procedure Laterality Date   ABDOMINAL HYSTERECTOMY  1997   TAH-LEIOMYOMATA-- no BSO   BIOPSY THYROID      bx of largest nodule 2005, benign   BREAST SURGERY     x's 2--fibrocystic dz   CARDIOVERSION N/A 10/10/2021   Procedure: CARDIOVERSION;  Surgeon: Sanda Klein, MD;  Location: [facility];  Service: Cardiovascular;  Laterality: N/A;   ESOPHAGOGASTRODUODENOSCOPY (EGD) WITH PROPOFOL N/A 10/12/2021   Procedure:  ESOPHAGOGASTRODUODENOSCOPY (EGD) WITH PROPOFOL;  Surgeon: Doran Stabler, MD;  Location: [facility];  Service: Gastroenterology;  Laterality: N/A;   TONSILLECTOMY     Social History   Socioeconomic History   Marital status: Single    Spouse name: Not on file   Number of children: 0   Years of education: Not on file   Highest education level: Not on file  Occupational History   Occupation: retired---collections  Tobacco Use   Smoking status: Never   Smokeless tobacco: Never  Vaping Use   Vaping Use: Never used  Substance and Sexual Activity   Alcohol use: No    Alcohol/week: 0.0 standard drinks   Drug use: No   Sexual activity: Never    Comment: [place]  Other Topics Concern   Not on file  Social History Narrative   Lives by herself    Social Determinants of Health   Financial Resource Strain: Not on file  Food Insecurity: Not on file  Transportation Needs: Not on file  Physical Activity: Not on file  Stress: Not on file  Social Connections: Not on file  Intimate Partner Violence: Not on file   Family History  Problem Relation Age of Onset   Seizures Father        Epilepsy   Hypertension Father    Heart attack Father        ?   Breast cancer Sister 67   Heart disease Mother    Hypertension Mother    Colon cancer Neg Hx    Pancreatic cancer  Neg Hx    Esophageal cancer Neg Hx    Prostate cancer Neg Hx    Rectal cancer Neg Hx      Current Outpatient Medications  Medication Instructions   apixaban (ELIQUIS) 5 mg, Oral, 2 times daily   calcium carbonate (OS-CAL - DOSED IN MG OF ELEMENTAL CALCIUM) 1,250 mg, Oral, Daily with breakfast   cholecalciferol (VITAMIN D) 1,000 Units, Daily   ferrous sulfate 325 mg, Oral, 2 times daily with meals   furosemide (LASIX) 60 mg, Oral, 2 times daily   Multiple Vitamin (MULTIVITAMIN WITH MINERALS) TABS tablet 1 tablet, Oral, Every morning, Alive   pantoprazole (PROTONIX) 40 mg, Oral, 2 times daily   potassium chloride  SA (KLOR-CON M) 20 MEQ tablet 20 mEq, Oral, 2 times daily   vitamin A 10,000 Units, Daily   vitamin B-12 (CYANOCOBALAMIN) 1,000 mcg, Oral, Daily   vitamin C 1,000 mg, Daily   vitamin E 400 Units, Daily   zinc gluconate 50 mg, Oral, Daily       Objective:   Physical Exam BP 116/76 (BP Location: Left Arm, Patient Position: Sitting, Cuff Size: Normal)    Pulse 65    Temp 98.3 F (36.8 C) (Oral)    Resp 18    Ht 5\' 6"  (1.676 m)    Wt 214 lb 8 oz (97.3 kg)    LMP 11/05/1995    SpO2 96%    BMI 34.62 kg/m  General:   Well developed, NAD, BMI noted.  HEENT:  Normocephalic . Face symmetric, atraumatic Neck: Right thyromegaly, nontender, not nodular Lungs:  CTA B Normal respiratory effort, no intercostal retractions, no accessory muscle use. Heart: Bradycardic, occasionally irregular beat. Abdomen:  Not distended, soft, non-tender. No rebound or rigidity.   Skin: Not pale. Not jaundice Lower extremities: no pretibial edema bilaterally  Neurologic:  alert & oriented X3.  Speech normal, gait and transfer limited, uses a cane Psych--  Cognition and judgment appear intact.  Cooperative with normal attention span and concentration.  Behavior appropriate. No anxious or depressed appearing.     Assessment     Assessment Gout Thyroid disease,  -RAI 2004 - Bx 2005 neg -[place] 10-2014-- new nodule, Bx unsuccessful 12-2014 Morbid obesity  H/o  VIN I,  2000 Vitamin D deficiency Carotid bruit w/ negative Dopplers A- Fibrillation,Dx 07-2021, cardioverted 10/11/2021 CHF Dx 07-2021 Acute anemia due to H. pylori + gastric ulcer 10/2021   PLAN Here to reestablish.recent events reviewed: Seen at the ER 07-2021. Seen with SOB, dizziness, leg swelling, chest pain. Dx with atrial fibrillation. Subsequently saw cardiology. She was also diagnosed with CHF. Started Eliquis Underwent electrical cardioversion 10/11/2021. Shortly after  the procedure noted to have severe anemia, admitted, an upper  endoscopy showed gastric ulcer, H. pylori positive. Echo: dilated right heart chambers without evidence of left to right shunt mild-moderate reduced right ventricular systolic function, severely dilated right atrium and severe tricuspid regurgitation. Was discharged from the hospital 10/17/2021. Atrial fibrillation: Dx with persistent A-fib few months ago, currently anticoagulated.  Follow-up with cardiology. CHF: Diagnosed 07-2021, on Lasix, potassium, weight at home per patient stable.  Check FLP and CMP. Acute anemia, + H. pylori.  Last CBC stable, currently with no symptoms, on PPIs.  Scheduled to have scopes next week. Thyroid disease: Last TSH satisfactory, reassess on RTC Gout: no sxs, check uiric acid  Gait disorder: multifactorial, request a parking permit and further PT, will arrange.  She is not considered homebound. Social: Lives by herself, still  drives, sleeps on a hospital bed. Preventive care: PNM 20 today. To have a colonoscopy soon Reports she will see a gynecologist in March. RTC 3 months.    This visit occurred during the SARS-CoV-2 public health emergency.  Safety protocols were in place, including screening questions prior to the visit, additional usage of staff PPE, and extensive cleaning of exam room while observing appropriate contact time as indicated for disinfecting solutions.

## 2021-12-21 NOTE — Patient Instructions (Addendum)
°  GO TO THE LAB : Get the blood work   ° ° °GO TO THE FRONT DESK, PLEASE SCHEDULE YOUR APPOINTMENTS °Come back for a checkup in 3 months °

## 2021-12-23 ENCOUNTER — Encounter: Payer: Self-pay | Admitting: Internal Medicine

## 2021-12-23 NOTE — Assessment & Plan Note (Signed)
Here to reestablish.recent events reviewed: Seen at the ER 07-2021. Seen with SOB, dizziness, leg swelling, chest pain. Dx with atrial fibrillation. Subsequently saw cardiology. She was also diagnosed with CHF. Started Eliquis Underwent electrical cardioversion 10/11/2021. Shortly after  the procedure noted to have severe anemia, admitted, an upper endoscopy showed gastric ulcer, H. pylori positive. Echo: dilated right heart chambers without evidence of left to right shunt mild-moderate reduced right ventricular systolic function, severely dilated right atrium and severe tricuspid regurgitation. Was discharged from the hospital 10/17/2021. Atrial fibrillation: Dx with persistent A-fib few months ago, currently anticoagulated.  Follow-up with cardiology. CHF: Diagnosed 07-2021, on Lasix, potassium, weight at home per patient stable.  Check FLP and CMP. Acute anemia, + H. pylori.  Last CBC stable, currently with no symptoms, on PPIs.  Scheduled to have scopes next week. Thyroid disease: Last TSH satisfactory, reassess on RTC Gout: no sxs, check uiric acid  Gait disorder: multifactorial, request a parking permit and further PT, will arrange.  She is not considered homebound. Social: Lives by herself, still drives, sleeps on a hospital bed. Preventive care: PNM 20 today. To have a colonoscopy soon Reports she will see a gynecologist in March. RTC 3 months.

## 2021-12-24 ENCOUNTER — Telehealth: Payer: Self-pay

## 2021-12-24 DIAGNOSIS — I083 Combined rheumatic disorders of mitral, aortic and tricuspid valves: Secondary | ICD-10-CM | POA: Diagnosis not present

## 2021-12-24 DIAGNOSIS — I4819 Other persistent atrial fibrillation: Secondary | ICD-10-CM | POA: Diagnosis not present

## 2021-12-24 DIAGNOSIS — Z7901 Long term (current) use of anticoagulants: Secondary | ICD-10-CM | POA: Diagnosis not present

## 2021-12-24 DIAGNOSIS — Z9181 History of falling: Secondary | ICD-10-CM | POA: Diagnosis not present

## 2021-12-24 DIAGNOSIS — D6869 Other thrombophilia: Secondary | ICD-10-CM | POA: Diagnosis not present

## 2021-12-24 DIAGNOSIS — I5031 Acute diastolic (congestive) heart failure: Secondary | ICD-10-CM | POA: Diagnosis not present

## 2021-12-24 DIAGNOSIS — D649 Anemia, unspecified: Secondary | ICD-10-CM | POA: Diagnosis not present

## 2021-12-24 DIAGNOSIS — I272 Pulmonary hypertension, unspecified: Secondary | ICD-10-CM | POA: Diagnosis not present

## 2021-12-24 DIAGNOSIS — E559 Vitamin D deficiency, unspecified: Secondary | ICD-10-CM | POA: Diagnosis not present

## 2021-12-24 DIAGNOSIS — Z6835 Body mass index (BMI) 35.0-35.9, adult: Secondary | ICD-10-CM

## 2021-12-24 NOTE — Progress Notes (Signed)
Carelink Summary Report / Loop Recorder 

## 2021-12-24 NOTE — Anesthesia Preprocedure Evaluation (Addendum)
Anesthesia Evaluation  Patient identified by MRN, date of birth, ID band Patient awake    Reviewed: Allergy & Precautions, NPO status , Patient's Chart, lab work & pertinent test results  History of Anesthesia Complications Negative for: history of anesthetic complications  Airway Mallampati: II  TM Distance: >3 FB Neck ROM: Full    Dental  (+) Partial Lower, Dental Advisory Given   Pulmonary neg pulmonary ROS,    breath sounds clear to auscultation       Cardiovascular +CHF   Rhythm:Regular Rate:Normal  IMPRESSIONS    1. Left ventricular ejection fraction, by estimation, is 60 to 65%. The  left ventricle has normal function. The left ventricle has no regional  wall motion abnormalities. There is mild left ventricular hypertrophy of  the septal segment. Left ventricular  diastolic parameters are consistent with Grade I diastolic dysfunction  (impaired relaxation). There is the interventricular septum is flattened  in systole and diastole, consistent with right ventricular pressure and  volume overload.  2. Right ventricular systolic function is mildly reduced. The right  ventricular size is moderately enlarged. There is moderately elevated  pulmonary artery systolic pressure. The estimated right ventricular  systolic pressure is 53.0 mmHg.  3. Right atrial size was moderately dilated.  4. The mitral valve is normal in structure. Mild mitral valve  regurgitation. No evidence of mitral stenosis. There is mild holosystolic  prolapse of the middle segment of the anterior leaflet of the mitral  valve.  5. Tricuspid valve regurgitation is moderate to severe.  6. The aortic valve is normal in structure. Aortic valve regurgitation is  not visualized. No aortic stenosis is present.  7. The inferior vena cava is dilated in size with <50% respiratory  variability, suggesting right atrial pressure of 15 mmHg.   Comparison(s):  Prior images reviewed side by side.    Neuro/Psych negative neurological ROS     GI/Hepatic negative GI ROS, Neg liver ROS,   Endo/Other  Hypothyroidism   Renal/GU negative Renal ROS     Musculoskeletal   Abdominal   Peds  Hematology  (+) Blood dyscrasia, anemia ,   Anesthesia Other Findings   Reproductive/Obstetrics                            Anesthesia Physical  Anesthesia Plan  ASA: 3  Anesthesia Plan: MAC   Post-op Pain Management: Minimal or no pain anticipated   Induction: Intravenous  PONV Risk Score and Plan: 2 and Treatment may vary due to age or medical condition, Propofol infusion and Ondansetron  Airway Management Planned: Simple Face Mask and Natural Airway  Additional Equipment:   Intra-op Plan:   Post-operative Plan:   Informed Consent: I have reviewed the patients History and Physical, chart, labs and discussed the procedure including the risks, benefits and alternatives for the proposed anesthesia with the patient or authorized representative who has indicated his/her understanding and acceptance.     Dental advisory given  Plan Discussed with: CRNA and Anesthesiologist  Anesthesia Plan Comments:        Anesthesia Quick Evaluation

## 2021-12-24 NOTE — Telephone Encounter (Signed)
Plan of care signed and faxed back to [facility] at [phone]. Form sent for scanning.

## 2021-12-25 ENCOUNTER — Ambulatory Visit (HOSPITAL_COMMUNITY): Payer: Medicare Other | Admitting: Anesthesiology

## 2021-12-25 ENCOUNTER — Encounter (HOSPITAL_COMMUNITY): Payer: Self-pay | Admitting: Gastroenterology

## 2021-12-25 ENCOUNTER — Ambulatory Visit (HOSPITAL_COMMUNITY)
Admission: RE | Admit: 2021-12-25 | Discharge: 2021-12-25 | Disposition: A | Discharge status: Home / Self Care | Payer: Medicare Other | Attending: Gastroenterology | Admitting: Gastroenterology

## 2021-12-25 ENCOUNTER — Encounter (HOSPITAL_COMMUNITY)
Admission: RE | Disposition: A | Discharge status: Home / Self Care | Payer: Self-pay | Source: Home / Self Care | Attending: Gastroenterology

## 2021-12-25 ENCOUNTER — Ambulatory Visit (HOSPITAL_BASED_OUTPATIENT_CLINIC_OR_DEPARTMENT_OTHER): Payer: Medicare Other | Admitting: Anesthesiology

## 2021-12-25 ENCOUNTER — Other Ambulatory Visit: Payer: Self-pay

## 2021-12-25 DIAGNOSIS — D509 Iron deficiency anemia, unspecified: Secondary | ICD-10-CM | POA: Diagnosis not present

## 2021-12-25 DIAGNOSIS — I082 Rheumatic disorders of both aortic and tricuspid valves: Secondary | ICD-10-CM | POA: Insufficient documentation

## 2021-12-25 DIAGNOSIS — D124 Benign neoplasm of descending colon: Secondary | ICD-10-CM | POA: Insufficient documentation

## 2021-12-25 DIAGNOSIS — E039 Hypothyroidism, unspecified: Secondary | ICD-10-CM | POA: Insufficient documentation

## 2021-12-25 DIAGNOSIS — K635 Polyp of colon: Secondary | ICD-10-CM | POA: Diagnosis not present

## 2021-12-25 DIAGNOSIS — K2950 Unspecified chronic gastritis without bleeding: Secondary | ICD-10-CM | POA: Diagnosis not present

## 2021-12-25 DIAGNOSIS — Z8619 Personal history of other infectious and parasitic diseases: Secondary | ICD-10-CM | POA: Diagnosis not present

## 2021-12-25 DIAGNOSIS — I509 Heart failure, unspecified: Secondary | ICD-10-CM | POA: Diagnosis not present

## 2021-12-25 DIAGNOSIS — K259 Gastric ulcer, unspecified as acute or chronic, without hemorrhage or perforation: Secondary | ICD-10-CM | POA: Diagnosis not present

## 2021-12-25 DIAGNOSIS — K3189 Other diseases of stomach and duodenum: Secondary | ICD-10-CM

## 2021-12-25 DIAGNOSIS — K648 Other hemorrhoids: Secondary | ICD-10-CM | POA: Insufficient documentation

## 2021-12-25 DIAGNOSIS — D500 Iron deficiency anemia secondary to blood loss (chronic): Secondary | ICD-10-CM | POA: Diagnosis not present

## 2021-12-25 DIAGNOSIS — Z8711 Personal history of peptic ulcer disease: Secondary | ICD-10-CM | POA: Diagnosis not present

## 2021-12-25 DIAGNOSIS — K254 Chronic or unspecified gastric ulcer with hemorrhage: Secondary | ICD-10-CM | POA: Diagnosis not present

## 2021-12-25 HISTORY — PX: BIOPSY: SHX5522

## 2021-12-25 HISTORY — PX: ESOPHAGOGASTRODUODENOSCOPY (EGD) WITH PROPOFOL: SHX5813

## 2021-12-25 HISTORY — PX: POLYPECTOMY: SHX5525

## 2021-12-25 HISTORY — PX: COLONOSCOPY WITH PROPOFOL: SHX5780

## 2021-12-25 SURGERY — ESOPHAGOGASTRODUODENOSCOPY (EGD) WITH PROPOFOL
Anesthesia: Monitor Anesthesia Care

## 2021-12-25 MED ORDER — LACTATED RINGERS IV SOLN
INTRAVENOUS | Status: DC
Start: 2021-12-25 — End: 2021-12-25

## 2021-12-25 MED ORDER — LIDOCAINE HCL (CARDIAC) PF 100 MG/5ML IV SOSY
PREFILLED_SYRINGE | INTRAVENOUS | Status: DC | PRN
Start: 2021-12-25 — End: 2021-12-25
  Administered 2021-12-25: 100 mg via INTRAVENOUS

## 2021-12-25 MED ORDER — SODIUM CHLORIDE 0.9 % IV SOLN: INTRAVENOUS | Status: DC

## 2021-12-25 MED ORDER — PROPOFOL 500 MG/50ML IV EMUL
INTRAVENOUS | Status: DC | PRN
Start: 2021-12-25 — End: 2021-12-25
  Administered 2021-12-25: 120 ug/kg/min via INTRAVENOUS

## 2021-12-25 MED ORDER — PROPOFOL 10 MG/ML IV BOLUS
INTRAVENOUS | Status: DC | PRN
  Administered 2021-12-25: 30 mg via INTRAVENOUS

## 2021-12-25 MED ORDER — LACTATED RINGERS IV SOLN
INTRAVENOUS | Status: AC | PRN
  Administered 2021-12-25: 1000 mL via INTRAVENOUS

## 2021-12-25 SURGICAL SUPPLY — 25 items

## 2021-12-25 NOTE — Transfer of Care (Signed)
Immediate Anesthesia Transfer of Care Note  Patient: Brittney Walker  Procedure(s) Performed: ESOPHAGOGASTRODUODENOSCOPY (EGD) WITH PROPOFOL COLONOSCOPY WITH PROPOFOL POLYPECTOMY  Patient Location: PACU and Endoscopy Unit  Anesthesia Type:MAC  Level of Consciousness: awake and drowsy  Airway & Oxygen Therapy: Patient Spontanous Breathing and Patient connected to face mask oxygen  Post-op Assessment: Report given to RN and Post -op Vital signs reviewed and stable  Post vital signs: Reviewed and stable  Last Vitals:  Vitals Value Taken Time  BP    Temp    Pulse 76 12/25/21 0906  Resp 16 12/25/21 0906  SpO2 99 % 12/25/21 0906  Vitals shown include unvalidated device data.  Last Pain:  Vitals:   12/25/21 0810  TempSrc: Oral  PainSc: 0-No pain         Complications: No notable events documented.

## 2021-12-25 NOTE — Discharge Instructions (Signed)
YOU HAD AN ENDOSCOPIC PROCEDURE TODAY: Refer to the procedure report and other information in the discharge instructions given to you for any specific questions about what was found during the examination. If this information does not answer your questions, please call [place] office at [phone] to clarify.  ° °YOU SHOULD EXPECT: Some feelings of bloating in the abdomen. Passage of more gas than usual. Walking can help get rid of the air that was put into your GI tract during the procedure and reduce the bloating. If you had a lower endoscopy (such as a colonoscopy or flexible sigmoidoscopy) you may notice spotting of blood in your stool or on the toilet paper. Some abdominal soreness may be present for a day or two, also. ° °DIET: Your first meal following the procedure should be a light meal and then it is ok to progress to your normal diet. A half-sandwich or bowl of soup is an example of a good first meal. Heavy or fried foods are harder to digest and may make you feel nauseous or bloated. Drink plenty of fluids but you should avoid alcoholic beverages for 24 hours. If you had a esophageal dilation, please see attached instructions for diet.   ° °ACTIVITY: Your care partner should take you home directly after the procedure. You should plan to take it easy, moving slowly for the rest of the day. You can resume normal activity the day after the procedure however YOU SHOULD NOT DRIVE, use power tools, machinery or perform tasks that involve climbing or major physical exertion for 24 hours (because of the sedation medicines used during the test).  ° °SYMPTOMS TO REPORT IMMEDIATELY: °A gastroenterologist can be reached at any hour. Please call [phone]  for any of the following symptoms:  °Following lower endoscopy (colonoscopy, flexible sigmoidoscopy) °Excessive amounts of blood in the stool  °Significant tenderness, worsening of abdominal pains  °Swelling of the abdomen that is new, acute  °Fever of 100° or  higher  °Following upper endoscopy (EGD, EUS, ERCP, esophageal dilation) °Vomiting of blood or coffee ground material  °New, significant abdominal pain  °New, significant chest pain or pain under the shoulder blades  °Painful or persistently difficult swallowing  °New shortness of breath  °Black, tarry-looking or red, bloody stools ° °FOLLOW UP:  °If any biopsies were taken you will be contacted by phone or by letter within the next 1-3 weeks. Call [phone]  if you have not heard about the biopsies in 3 weeks.  °Please also call with any specific questions about appointments or follow up tests. ° °

## 2021-12-25 NOTE — Op Note (Signed)
[facility] Patient Name: Brittney Walker Procedure Date: 12/25/2021 MRN: [id number] Attending MD: Estill Cotta. 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 Loletha Carrow, MD, Jeanella Cara, RN, Cletis [place], Technician, Stephanie [place] CRNA Referring MD:             Kathlene November, MD Medicines:                Monitored Anesthesia Care Complications:            No immediate complications. Estimated Blood Loss:     Estimated blood loss was minimal. Procedure:                Pre-Anesthesia Assessment:                           - Prior to the procedure, a History and Physical                            was performed, and patient medications and                            allergies were reviewed. The patient's tolerance of                            previous anesthesia was also reviewed. The risks                            and benefits of the procedure and the sedation                            options and risks were discussed with the patient.                            All questions were answered, and informed consent                            was obtained. Prior Anticoagulants: The patient has                            taken Eliquis (apixaban), last dose was 2 days  prior to procedure. ASA Grade Assessment: III - A                            patient with severe systemic disease. After                            reviewing the risks and benefits, the patient was                            deemed in satisfactory condition to undergo the                            procedure.                           - Prior to the  procedure, a History and Physical                            was performed, and patient medications and                            allergies were reviewed. The patient's tolerance of                            previous anesthesia was also reviewed. The risks                            and benefits of the procedure and the sedation                            options and risks were discussed with the patient.                            All questions were answered, and informed consent                            was obtained. Prior Anticoagulants: The patient has                            taken Eliquis (apixaban), last dose was 2 days                            prior to procedure. ASA Grade Assessment: III - A                            patient with severe systemic disease. After                            reviewing the risks and benefits, the patient was                            deemed in satisfactory condition to undergo the  procedure.                           After obtaining informed consent, the endoscope was                            passed under direct vision. Throughout the                            procedure, the patient's blood pressure, pulse, and                            oxygen saturations were monitored continuously. The                            [id number]-[id number] ([id number]) Olympus endoscope was introduced                            through the mouth, and advanced to the second part                            of duodenum. The upper GI endoscopy was                            accomplished without difficulty. The patient                            tolerated the procedure well. Scope In: Scope Out: Findings:      The esophagus was normal.      A small healed ulcer was found at the incisura. Several biopsies were       obtained on the greater curvature of the gastric body, on the lesser       curvature of the gastric body, on the greater curvature of the  gastric       antrum and on the lesser curvature of the gastric antrum with cold       forceps for histology. (to confirm H. pylori eradication)      The exam of the stomach was otherwise normal.      The cardia and gastric fundus were normal on retroflexion.      The examined duodenum was normal. Impression:               - Normal esophagus.                           - Scar in the incisura.                           - Normal examined duodenum.                           - Several biopsies were obtained on the greater                            curvature of the gastric body, on the lesser  curvature of the gastric body, on the greater                            curvature of the gastric antrum and on the lesser                            curvature of the gastric antrum. Moderate Sedation:      MAC sedation used Recommendation:           - Patient has a contact number available for                            emergencies. The signs and symptoms of potential                            delayed complications were discussed with the                            patient. Return to normal activities tomorrow.                            Written discharge instructions were provided to the                            patient.                           - Resume previous diet.                           - Resume Eliquis (apixaban) at prior dose tomorrow.                           - Await pathology results.                           - See the other procedure note for documentation of                            additional recommendations.                           - Pantoprazole 40 mg twice daily for 4 more weeks.                           Continue iron supplement                           CBC in 2 weeks at my office Procedure Code(s):        --- Professional ---                           [phone], Esophagogastroduodenoscopy, flexible,                            transoral; with  biopsy, single or multiple Diagnosis Code(s):        ---  Professional ---                           K31.89, Other diseases of stomach and duodenum                           D50.0, Iron deficiency anemia secondary to blood                            loss (chronic)                           K25.4, Chronic or unspecified gastric ulcer with                            hemorrhage CPT copyright 2019 American Medical Association. All rights reserved. The codes documented in this report are preliminary and upon coder review may  be revised to meet current compliance requirements. Tafari Humiston L. Loletha Carrow, MD 12/25/2021 9:10:55 AM This report has been signed electronically. Number of Addenda: 0

## 2021-12-25 NOTE — H&P (Signed)
History and Physical:  This patient presents for endoscopic testing for: Iron deficiency anemia and H. pylori related gastric ulcer  71 year old woman presents for endoscopic testing, last seen in our office 11/21/2021 with clinical details included in that progress note. She has an H. pylori gastric ulcer discovered during a December 2022 inpatient stay, treated with bismuth based quadruple therapy and twice daily PPI. She denies black or bloody stool or abdominal pain, took her last dose of Eliquis 2 days ago.  ROS: Patient denies chest pain or shortness of breath   Past Medical History: Past Medical History:  Diagnosis Date   Carotid bruit    w/ neg dopplers    CHF (congestive heart failure) ([place])    Hyperthyroidism     s/p RAI 2004   Normocytic anemia    Persistent atrial fibrillation ([place])    a. Dx 07/2021; b. CHA2DS2VASc = 3-->Eliquis; c. 10/2021 s/p DCCV.   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   Vitamin D deficiency      Past Surgical History: Past Surgical History:  Procedure Laterality Date   ABDOMINAL HYSTERECTOMY  1997   TAH-LEIOMYOMATA-- no BSO   BIOPSY THYROID      bx of largest nodule 2005, benign   BREAST SURGERY     x's 2--fibrocystic dz   CARDIOVERSION N/A 10/10/2021   Procedure: CARDIOVERSION;  Surgeon: Sanda Klein, MD;  Location: [place];  Service: Cardiovascular;  Laterality: N/A;   ESOPHAGOGASTRODUODENOSCOPY (EGD) WITH PROPOFOL N/A 10/12/2021   Procedure: ESOPHAGOGASTRODUODENOSCOPY (EGD) WITH PROPOFOL;  Surgeon: Doran Stabler, MD;  Location: [place];  Service: Gastroenterology;  Laterality: N/A;   TONSILLECTOMY      Allergies: No Known Allergies  Outpatient Meds: Current Facility-Administered Medications  Medication Dose Route Frequency Provider Last Rate Last Admin   0.9 %  sodium chloride infusion   Intravenous Continuous Danis, Estill Cotta III, MD       lactated ringers infusion   Intravenous Continuous Nelida Meuse III, MD          ___________________________________________________________________ Objective   Exam:  LMP 11/05/1995   CV: RRR without murmur, S1/S2 Resp: clear to auscultation bilaterally, normal RR and effort noted GI: soft, no tenderness, with active bowel sounds. 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 Anemia has been improving in the last 2 months on iron supplementation and treatment of the ulcer.   Plan: Colonoscopy EGD with biopsies to confirm H. pylori eradication   The benefits and risks of the planned procedure were described in detail with the patient or (when appropriate) their health care proxy.  Risks were outlined as including, but not limited to, bleeding, infection, perforation, adverse medication reaction leading to cardiac or pulmonary decompensation, pancreatitis (if ERCP).  The limitation of incomplete mucosal visualization was also discussed.  No guarantees or warranties were given.    The patient is appropriate for an endoscopic procedure in the ambulatory setting.   - Wilfrid Lund, MD

## 2021-12-25 NOTE — Op Note (Signed)
[facility] Patient Name: Brittney Walker Procedure Date: 12/25/2021 MRN: [id number] Attending MD: Estill Cotta. Loletha Carrow , MD Date of Birth: Mar 23, 1951 CSN: [id number] Age: 71 Admit Type: Outpatient Procedure:                Colonoscopy Indications:              Iron deficiency anemia secondary to chronic blood                            loss Providers:                Mallie Mussel L. Loletha Carrow, MD, Dulcy Fanny, Cletis [place],                            Technician, Stephanie [place] CRNA Referring MD:              Medicines:                Monitored Anesthesia Care Complications:            No immediate complications. Estimated Blood Loss:     Estimated blood loss was minimal. Procedure:                Pre-Anesthesia Assessment:                           - Prior to the procedure, a History and Physical                            was performed, and patient medications and                            allergies were reviewed. The patient's tolerance of                            previous anesthesia was also reviewed. The risks                            and benefits of the procedure and the sedation                            options and risks were discussed with the patient.                            All questions were answered, and informed consent                            was obtained. Prior Anticoagulants: The patient has                            taken Eliquis (apixaban), last dose was 2 days                            prior to procedure. ASA Grade Assessment: III - A  patient with severe systemic disease. After                            reviewing the risks and benefits, the patient was                            deemed in satisfactory condition to undergo the                            procedure.                           After obtaining informed consent, the colonoscope                            was passed under direct vision. Throughout the                             procedure, the patient's blood pressure, pulse, and                            oxygen saturations were monitored continuously. The                            CF-HQ190L ([id number] Olympus colonoscope was                            introduced through the anus and advanced to the the                            terminal ileum, with identification of the                            appendiceal orifice and IC valve. The colonoscopy                            was somewhat difficult due to a redundant colon.                            Successful completion of the procedure was aided by                            using manual pressure and straightening and                            shortening the scope to obtain bowel loop                            reduction. The patient tolerated the procedure                            well. The quality of the bowel preparation was  excellent. The terminal ileum, ileocecal valve,                            appendiceal orifice, and rectum were photographed. Scope In: 8:20:17 AM Scope Out: 8:43:44 AM Scope Withdrawal Time: 0 hours 16 minutes 8 seconds  Total Procedure Duration: 0 hours 23 minutes 27 seconds  Findings:      The perianal and digital rectal examinations were normal.      Second looks right colon with NBI performed.      A diminutive polyp was found in the descending colon. The polyp was       semi-sessile. The polyp was removed with a cold snare. Resection and       retrieval were complete.      Internal hemorrhoids were found.      The exam was otherwise without abnormality on direct and retroflexion       views.      The terminal ileum appeared normal. Impression:               - One diminutive polyp in the descending colon,                            removed with a cold snare. Resected and retrieved.                           - Internal hemorrhoids.                           - The examination was  otherwise normal on direct                            and retroflexion views.                           - The examined portion of the ileum was normal. Moderate Sedation:      MAC sedation used Recommendation:           - Patient has a contact number available for                            emergencies. The signs and symptoms of potential                            delayed complications were discussed with the                            patient. Return to normal activities tomorrow.                            Written discharge instructions were provided to the                            patient.                           - Resume previous diet.                           -  Resume Eliquis (apixaban) at prior dose tomorrow.                           - Await pathology results.                           - No repeat surveillance colonoscopy recommended                            based on age, current guidelines and today's                            findings.                           - See the other procedure note for documentation of                            additional recommendations. Procedure Code(s):        --- Professional ---                           [phone], Colonoscopy, flexible; with removal of                            tumor(s), polyp(s), or other lesion(s) by snare                            technique Diagnosis Code(s):        --- Professional ---                           K63.5, Polyp of colon                           K64.8, Other hemorrhoids                           D50.0, Iron deficiency anemia secondary to blood                            loss (chronic) CPT copyright 2019 American Medical Association. All rights reserved. The codes documented in this report are preliminary and upon coder review may  be revised to meet current compliance requirements. Poppi Scantling L. Loletha Carrow, MD 12/25/2021 8:50:05 AM This report has been signed electronically. Number of Addenda: 0

## 2021-12-25 NOTE — Interval H&P Note (Signed)
History and Physical Interval Note:  12/25/2021 8:10 AM  Brittney Walker  has presented today for surgery, with the diagnosis of gastric ulcer, IDA.  The various methods of treatment have been discussed with the patient and family. After consideration of risks, benefits and other options for treatment, the patient has consented to  Procedure(s): ESOPHAGOGASTRODUODENOSCOPY (EGD) WITH PROPOFOL (N/A) COLONOSCOPY WITH PROPOFOL (N/A) as a surgical intervention.  The patient's history has been reviewed, patient examined, no change in status, stable for surgery.  I have reviewed the patient's chart and labs.  Questions were answered to the patient's satisfaction.     Nelida Meuse III

## 2021-12-25 NOTE — Anesthesia Postprocedure Evaluation (Signed)
Anesthesia Post Note  Patient: Brittney Walker  Procedure(s) Performed: ESOPHAGOGASTRODUODENOSCOPY (EGD) WITH PROPOFOL COLONOSCOPY WITH PROPOFOL POLYPECTOMY     Patient location during evaluation: Endoscopy Anesthesia Type: MAC Level of consciousness: awake and alert Pain management: pain level controlled Vital Signs Assessment: post-procedure vital signs reviewed and stable Respiratory status: spontaneous breathing and respiratory function stable Cardiovascular status: stable Postop Assessment: no apparent nausea or vomiting Anesthetic complications: no   No notable events documented.  Last Vitals:  Vitals:   12/25/21 0920 12/25/21 0930  BP: (!) 124/37 (!) 158/76  Pulse: 62 61  Resp: 19 18  Temp:    SpO2: 93% 100%    Last Pain:  Vitals:   12/25/21 0930  TempSrc:   PainSc: 0-No pain                 Kynzie Polgar DANIEL

## 2021-12-26 ENCOUNTER — Encounter (HOSPITAL_COMMUNITY): Payer: Self-pay | Admitting: Gastroenterology

## 2021-12-27 LAB — SURGICAL PATHOLOGY

## 2021-12-28 ENCOUNTER — Encounter: Payer: Self-pay | Admitting: Gastroenterology

## 2021-12-31 DIAGNOSIS — D649 Anemia, unspecified: Secondary | ICD-10-CM | POA: Diagnosis not present

## 2021-12-31 DIAGNOSIS — H5340 Unspecified visual field defects: Secondary | ICD-10-CM | POA: Diagnosis not present

## 2021-12-31 DIAGNOSIS — D3121 Benign neoplasm of right retina: Secondary | ICD-10-CM | POA: Diagnosis not present

## 2022-01-04 ENCOUNTER — Other Ambulatory Visit: Payer: Self-pay | Admitting: Gastroenterology

## 2022-01-24 ENCOUNTER — Other Ambulatory Visit: Payer: Self-pay

## 2022-01-24 ENCOUNTER — Encounter: Payer: Self-pay | Admitting: Obstetrics and Gynecology

## 2022-01-24 ENCOUNTER — Ambulatory Visit (INDEPENDENT_AMBULATORY_CARE_PROVIDER_SITE_OTHER): Payer: 59 | Admitting: Obstetrics and Gynecology

## 2022-01-24 VITALS — BP 114/62 | HR 59 | Ht 66.5 in | Wt 210.0 lb

## 2022-01-24 DIAGNOSIS — Z01419 Encounter for gynecological examination (general) (routine) without abnormal findings: Secondary | ICD-10-CM

## 2022-01-24 NOTE — Patient Instructions (Signed)

## 2022-01-24 NOTE — Progress Notes (Signed)
71 y.o. G0P0 Single African American female here for annual exam.   ? ?No GYN concerns.  ? ?Status post TAH for fibroids by Dr. Phineas Real.  ?No menopausal concerns. ? ?No hormonal therapy.  ? ?Not sexually active. ? ?Retired from [employer].  ? ?PCP:  Kathlene November, MD ? ?Patient's last menstrual period was 11/05/1995.     ?  ?    ?Sexually active: No.  ?The current method of family planning is status post hysterectomy.    ?Exercising: Yes.     Some walking ?Smoker:  no ? ?Health Maintenance: ?Pap:  12-07-18 Neg, 01-10-15 Neg. ?History of abnormal Pap:  no ?MMG:  12-13-21 Neg/BiRads2 ?Colonoscopy:  12-25-21 polyp removed/aged out ?BMD:  03-06-15  Result :Normal ?TDaP:  Td 12-03-16 ?Gardasil:   n/a ?OVZ:CHYIFO had in past--Neg ?Hep C:06-24-17 Neg ?Screening Labs:  PCP ? ? reports that she has never smoked. She has never used smokeless tobacco. She reports that she does not drink alcohol and does not use drugs. ? ?Past Medical History:  ?Diagnosis Date  ? Atrial fibrillation ([place]) 10/04/2021  ? Carotid bruit   ? w/ neg dopplers   ? CHF (congestive heart failure) ([place])   ? History of gastric ulcer 10/04/2021  ? Hyperthyroidism   ?  s/p RAI 2004  ? Normocytic anemia   ? Persistent atrial fibrillation ([place])   ? a. Dx 07/2021; b. CHA2DS2VASc = 3-->Eliquis; c. 10/2021 s/p DCCV.  ? 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 Vitamin D deficiency   ? ? ?Past Surgical History:  ?Procedure Laterality Date  ? ABDOMINAL HYSTERECTOMY  1997  ? TAH-LEIOMYOMATA-- no BSO  ? BIOPSY  12/25/2021  ? Procedure: BIOPSY;  Surgeon: Doran Stabler, MD;  Location: [facility];  Service: Gastroenterology;;  ? BIOPSY THYROID    ?  bx of largest nodule 2005, benign  ? BREAST SURGERY    ? x's 2--fibrocystic dz  ? CARDIOVERSION N/A 10/10/2021  ? Procedure: CARDIOVERSION;  Surgeon: Sanda Klein, MD;  Location: [facility];  Service: Cardiovascular;  Laterality: N/A;  ? COLONOSCOPY WITH PROPOFOL N/A 12/25/2021  ? Procedure: COLONOSCOPY WITH PROPOFOL;   Surgeon: Doran Stabler, MD;  Location: [facility];  Service: Gastroenterology;  Laterality: N/A;  ? ESOPHAGOGASTRODUODENOSCOPY (EGD) WITH PROPOFOL N/A 10/12/2021  ? Procedure: ESOPHAGOGASTRODUODENOSCOPY (EGD) WITH PROPOFOL;  Surgeon: Doran Stabler, MD;  Location: [place];  Service: Gastroenterology;  Laterality: N/A;  ? ESOPHAGOGASTRODUODENOSCOPY (EGD) WITH PROPOFOL N/A 12/25/2021  ? Procedure: ESOPHAGOGASTRODUODENOSCOPY (EGD) WITH PROPOFOL;  Surgeon: Doran Stabler, MD;  Location: [facility];  Service: Gastroenterology;  Laterality: N/A;  ? POLYPECTOMY  12/25/2021  ? Procedure: POLYPECTOMY;  Surgeon: Doran Stabler, MD;  Location: [facility];  Service: Gastroenterology;;  ? TONSILLECTOMY    ? ? ?Current Outpatient Medications  ?Medication Sig Dispense Refill  ? apixaban (ELIQUIS) 5 MG TABS tablet Take 1 tablet (5 mg total) by mouth 2 (two) times daily. 180 tablet 3  ? Ascorbic Acid (VITAMIN C) 1000 MG tablet Take 1,000 mg by mouth daily.    ? calcium carbonate (OS-CAL - DOSED IN MG OF ELEMENTAL CALCIUM) 1250 MG tablet Take 1,250 mg by mouth daily with breakfast.    ? cholecalciferol (VITAMIN D) 1000 UNITS tablet Take 1,000 Units by mouth daily.    ? ferrous sulfate 325 (65 FE) MG tablet Take 1 tablet (325 mg total) by mouth 2 (two) times daily with a meal. 60 tablet 3  ?  furosemide (LASIX) 20 MG tablet Take 60 mg by mouth daily as needed for edema.    ? furosemide (LASIX) 40 MG tablet Take 1.5 tablets (60 mg total) by mouth 2 (two) times daily. 90 tablet 11  ? gatifloxacin (ZYMAXID) 0.5 % SOLN Place 1 drop into the right eye 4 (four) times daily.    ? ketorolac (ACULAR) 0.5 % ophthalmic solution Place 1 drop into the right eye 4 (four) times daily.    ? Multiple Vitamin (MULTIVITAMIN WITH MINERALS) TABS tablet Take 1 tablet by mouth in the morning. Alive    ? omeprazole (PRILOSEC) 40 MG capsule Take 40 mg by mouth 2 (two) times daily.    ? potassium chloride SA (KLOR-CON M) 20 MEQ tablet  Take 1 tablet (20 mEq total) by mouth 2 (two) times daily. 60 tablet 6  ? prednisoLONE acetate (PRED FORTE) 1 % ophthalmic suspension Place 1 drop into the right eye 4 (four) times daily.    ? vitamin A 10000 UNIT capsule Take 10,000 Units by mouth daily.    ? vitamin B-12 (CYANOCOBALAMIN) 1000 MCG tablet Take 1,000 mcg by mouth daily.    ? vitamin E 400 UNIT capsule Take 400 Units by mouth daily.    ? zinc gluconate 50 MG tablet Take 50 mg by mouth daily.    ? ?No current facility-administered medications for this visit.  ? ? ?Family History  ?Problem Relation Age of Onset  ? Seizures Father   ?     Epilepsy  ? Hypertension Father   ? Heart attack Father   ?     ?  ? Breast cancer Sister 62  ? Heart disease Mother   ? Hypertension Mother   ? Colon cancer Neg Hx   ? Pancreatic cancer Neg Hx   ? Esophageal cancer Neg Hx   ? Prostate cancer Neg Hx   ? Rectal cancer Neg Hx   ? ? ?Review of Systems  ?All other systems reviewed and are negative. ? ?Exam:   ?BP 114/62   Pulse (!) 59   Ht 5' 6.5" (1.689 m)   Wt 210 lb (95.3 kg)   LMP 11/05/1995   SpO2 97%   BMI 33.39 kg/m?     ?General appearance: alert, cooperative and appears stated age ?Head: normocephalic, without obvious abnormality, atraumatic ?Neck: no adenopathy, supple, symmetrical, trachea midline and thyroid normal to inspection and palpation ?Lungs: clear to auscultation bilaterally ?Breasts: normal appearance, no masses or tenderness, No nipple retraction or dimpling, No nipple discharge or bleeding, No axillary adenopathy ?Heart: regular rate and rhythm ?Abdomen: soft, non-tender; no masses, no organomegaly ?Extremities: extremities normal, atraumatic, no cyanosis or edema ?Skin: skin color, texture, turgor normal. No rashes or lesions ?Lymph nodes: cervical, supraclavicular, and axillary nodes normal. ?Neurologic: grossly normal ? ?Pelvic: External genitalia:  no lesions ?             No abnormal inguinal nodes palpated. ?             Urethra:  normal  appearing urethra with no masses, tenderness or lesions ?             Bartholins and Skenes: normal    ?             Vagina: normal appearing vagina with normal color and discharge, no lesions ?             Cervix: absent ?  Pap taken: no ?Bimanual Exam:  Uterus:  absent ?             Adnexa: no mass, fullness, tenderness ?             Rectal exam: yes.  Confirms. ?             Anus:  normal sphincter tone, no lesions ? ?Chaperone was present for exam:  Santiago Glad CMA ? ?Assessment:   ?Well woman visit with gynecologic exam. ?Status post TAH for fibroids. Ovaries remain.  ?Hx VIN I 2000. See pathology report in Epic.  ?Atrial fibrillation.   ? ?Plan: ?Mammogram screening discussed. ?Self breast awareness reviewed. ?Pap not indicated. ?Guidelines for Calcium, Vitamin D, regular exercise program including cardiovascular and weight bearing exercise. ?Next breast and pelvic exam in 2 years. ? ?After visit summary provided.  ? ? ? ?

## 2022-01-28 DIAGNOSIS — Z006 Encounter for examination for normal comparison and control in clinical research program: Secondary | ICD-10-CM

## 2022-01-28 NOTE — Research (Signed)
Alleviate HF ? ?Carelink download ? ? ? ? ?

## 2022-02-02 ENCOUNTER — Telehealth: Payer: Self-pay | Admitting: Internal Medicine

## 2022-02-02 NOTE — Telephone Encounter (Signed)
Called regarding slower heart rate than normal. Low 50s. Slightly sluggish but no syncope.  Pt to monitor and given bp is low normal can hold lasix until Monday/ ?

## 2022-02-04 ENCOUNTER — Telehealth: Payer: Self-pay | Admitting: Cardiovascular Disease

## 2022-02-04 NOTE — Telephone Encounter (Signed)
? ?  Pre-operative Risk Assessment  ?  ?Patient Name: Brittney Walker  ?DOB: Mar 11, 1951 ?MRN: [id number]  ? ? ? ?Request for Surgical Clearance   ? ?Procedure:  Dental cleaning and Possible Dental Extraction same day- Amount of Teeth to be Pulled:  1 tooth ? ?Date of Surgery:  Clearance 02/08/22                              ?   ?Surgeon:  Dr.  Pearson Grippe ?Surgeon's Group or Practice Name:  [facility] ?Phone number:  [phone] ?Fax number:  [phone] ?  ?Type of Clearance Requested:   ?- Pharmacy:  Hold Apixaban (Eliquis) TBD by Cardiology ?  ?Type of Anesthesia:  Local  ?  ?Additional requests/questions:  Patient is scheduled for just a cleaning now but may have single tooth extracted same day ? ?Signed, ?Johnna Acosta   ?02/04/2022, 9:57 AM   ?

## 2022-02-04 NOTE — Telephone Encounter (Signed)
Reviewed her loop recorder download (last download on 01/28/2022). ?No episodes of serious bradycardia. Very little AFib recently. ?Average heart rate is in the 70s. ?When rhythm is irregular (AFib), mechanical means of checking the heart rate (pulse oximeter, automatic BP cuff, radial pulse palpation, etc.) can underestimate the true heart rate. ?She is not receiving any rate slowing meds. ?Would continue same treatment. ?

## 2022-02-04 NOTE — Telephone Encounter (Signed)
? ?  Patient Name: Brittney Walker  ?DOB: 1951-02-21 ?MRN: [id number] ? ?Primary Cardiologist: Sanda Klein, MD ? ?Chart reviewed as part of pre-operative protocol coverage.  ? ?Simple (1-2 teeth) dental extractions and cleaning are considered low risk procedures per guidelines and generally do not require any specific cardiac clearance. It is also generally accepted that for simple extractions and dental cleanings, there is no need to interrupt blood thinner therapy. ? ?SBE prophylaxis is not required for the patient from a cardiac standpoint. ? ?I will route this recommendation to the requesting party via Epic fax function and remove from pre-op pool. ? ?Please call with questions. ? ?Almyra Deforest, [place] ?02/04/2022, 12:22 PM ? ?

## 2022-02-05 ENCOUNTER — Telehealth: Payer: Self-pay | Admitting: Cardiology

## 2022-02-05 NOTE — Telephone Encounter (Signed)
Attempted to call the patient twice, both times went straight into voicemail.  "The person you are calling is not excepting messages right now" ? ?Please inform the patient that we do not recommend stopping the blood thinner for single tooth extraction or dental cleaning. ?

## 2022-02-05 NOTE — Telephone Encounter (Signed)
Please ask her to restart the furosemide.  The diuretic has no impact on her heart rate.  Please see my note from yesterday on the same topic. ?It is quite possible that the slower heart rate is an artifact if she has gone into atrial fibrillation. ?We will ask the device clinic to get a fresh download on her, but I am not sure if they can, since her device was implanted as part of the alleviate HF clinical trial.  It would be a great way to confirm what her true heart rate is currently. ?Amy, can we access her device downloads through Carelink, the usual way? Just for rhythm confirmation. ?

## 2022-02-05 NOTE — Telephone Encounter (Signed)
See other note

## 2022-02-05 NOTE — Telephone Encounter (Signed)
Patient called wanting to know when she is suppose to stop taking her blood thinners.  ?

## 2022-02-05 NOTE — Telephone Encounter (Signed)
Patient is back on lasix as of today. ?

## 2022-02-05 NOTE — Telephone Encounter (Signed)
Spoke with patient who reports that over the weekend (Saturday) her pulse was 50. The on-call doctor had her drink fluids and hold lasix until Monday. Today, BP 144/66, P 55. Weight 214 pounds (usually weighs 210), legs and feet edema, no SOB. ?

## 2022-02-05 NOTE — Telephone Encounter (Signed)
She had called the person on call on Saturday, she was advised to give [place] a call. Her HR had gone down they advised her to drink more liquids, that did help her HR go up.  They advised [place] to go off of her fluid pills until she check in with [place].  Please advise.  ?

## 2022-02-06 NOTE — Telephone Encounter (Signed)
I tried to reach pt on her ph # as well and got the same message as the pre op provider:  "The person you are calling is not excepting messages right now".  ? ?I then called DPR on file and s/w Thea Silversmith. I explained to [facility] that we are trying to reach the pt about her Eliquis instructions for the dental work. I advised per the recommendations from pre op provider Almyra Deforest, [facility] the pt will not need to hold her Eliquis for 1 dental extraction. I assured Hallie that I will fax these notes over to the DDS office with the recommendations. Hallie thanked me for the call and assured me she will let the pt know.  ? ?  ?Dr. Pearson Grippe, DDS, please see notes from Almyra Deforest, [facility], pre op provider.  ?

## 2022-02-15 DIAGNOSIS — Z006 Encounter for examination for normal comparison and control in clinical research program: Secondary | ICD-10-CM

## 2022-02-21 NOTE — Research (Addendum)
Alleviate 4 Month ? ? ? ?Were new reportable adverse events identified that were not previously reported               ? YES '[]'$     NO '[x]'$  ? ?Were reportable medication changes identified ?YES '[]'$  NO'[x]'$  ? ? ?Current Outpatient Medications:  ?  apixaban (ELIQUIS) 5 MG TABS tablet, Take 1 tablet (5 mg total) by mouth 2 (two) times daily., Disp: 180 tablet, Rfl: 3 ?  Ascorbic Acid (VITAMIN C) 1000 MG tablet, Take 1,000 mg by mouth daily., Disp: , Rfl:  ?  calcium carbonate (OS-CAL - DOSED IN MG OF ELEMENTAL CALCIUM) 1250 MG tablet, Take 1,250 mg by mouth daily with breakfast., Disp: , Rfl:  ?  cholecalciferol (VITAMIN D) 1000 UNITS tablet, Take 1,000 Units by mouth daily., Disp: , Rfl:  ?  ferrous sulfate 325 (65 FE) MG tablet, Take 1 tablet (325 mg total) by mouth 2 (two) times daily with a meal., Disp: 60 tablet, Rfl: 3 ?  furosemide (LASIX) 20 MG tablet, Take 60 mg by mouth daily as needed for edema., Disp: , Rfl:  ?  furosemide (LASIX) 40 MG tablet, Take 1.5 tablets (60 mg total) by mouth 2 (two) times daily., Disp: 90 tablet, Rfl: 11 ?  gatifloxacin (ZYMAXID) 0.5 % SOLN, Place 1 drop into the right eye 4 (four) times daily., Disp: , Rfl:  ?  ketorolac (ACULAR) 0.5 % ophthalmic solution, Place 1 drop into the right eye 4 (four) times daily., Disp: , Rfl:  ?  Multiple Vitamin (MULTIVITAMIN WITH MINERALS) TABS tablet, Take 1 tablet by mouth in the morning. Alive, Disp: , Rfl:  ?  omeprazole (PRILOSEC) 40 MG capsule, Take 40 mg by mouth 2 (two) times daily., Disp: , Rfl:  ?  potassium chloride SA (KLOR-CON M) 20 MEQ tablet, Take 1 tablet (20 mEq total) by mouth 2 (two) times daily., Disp: 60 tablet, Rfl: 6 ?  prednisoLONE acetate (PRED FORTE) 1 % ophthalmic suspension, Place 1 drop into the right eye 4 (four) times daily., Disp: , Rfl:  ?  vitamin A 10000 UNIT capsule, Take 10,000 Units by mouth daily., Disp: , Rfl:  ?  vitamin B-12 (CYANOCOBALAMIN) 1000 MCG tablet, Take 1,000 mcg by mouth daily., Disp: , Rfl:  ?   vitamin E 400 UNIT capsule, Take 400 Units by mouth daily., Disp: , Rfl:  ?  zinc gluconate 50 MG tablet, Take 50 mg by mouth daily., Disp: , Rfl:   ? ? ? ? ? ?  ?  ?

## 2022-03-20 ENCOUNTER — Other Ambulatory Visit: Payer: Self-pay | Admitting: Gastroenterology

## 2022-03-20 ENCOUNTER — Encounter: Payer: Self-pay | Admitting: Internal Medicine

## 2022-03-20 ENCOUNTER — Ambulatory Visit (INDEPENDENT_AMBULATORY_CARE_PROVIDER_SITE_OTHER): Payer: 59 | Admitting: Internal Medicine

## 2022-03-20 VITALS — BP 128/62 | HR 58 | Temp 97.7°F | Resp 18 | Ht 66.5 in | Wt 218.0 lb

## 2022-03-20 DIAGNOSIS — M2141 Flat foot [pes planus] (acquired), right foot: Secondary | ICD-10-CM

## 2022-03-20 DIAGNOSIS — E041 Nontoxic single thyroid nodule: Secondary | ICD-10-CM | POA: Diagnosis not present

## 2022-03-20 DIAGNOSIS — I509 Heart failure, unspecified: Secondary | ICD-10-CM

## 2022-03-20 DIAGNOSIS — I5031 Acute diastolic (congestive) heart failure: Secondary | ICD-10-CM | POA: Diagnosis not present

## 2022-03-20 DIAGNOSIS — D649 Anemia, unspecified: Secondary | ICD-10-CM

## 2022-03-20 DIAGNOSIS — M2142 Flat foot [pes planus] (acquired), left foot: Secondary | ICD-10-CM

## 2022-03-20 DIAGNOSIS — I480 Paroxysmal atrial fibrillation: Secondary | ICD-10-CM

## 2022-03-20 LAB — CBC WITH DIFFERENTIAL/PLATELET
Basophils Absolute: 0 10*3/uL (ref 0.0–0.1)
Basophils Relative: 0.5 % (ref 0.0–3.0)
Eosinophils Absolute: 0.2 10*3/uL (ref 0.0–0.7)
Eosinophils Relative: 3.8 % (ref 0.0–5.0)
HCT: 39.2 % (ref 36.0–46.0)
Hemoglobin: 13.1 g/dL (ref 12.0–15.0)
Lymphocytes Relative: 30.5 % (ref 12.0–46.0)
Lymphs Abs: 1.3 10*3/uL (ref 0.7–4.0)
MCHC: 33.4 g/dL (ref 30.0–36.0)
MCV: 94.2 fl (ref 78.0–100.0)
Monocytes Absolute: 0.6 10*3/uL (ref 0.1–1.0)
Monocytes Relative: 13.4 % — ABNORMAL HIGH (ref 3.0–12.0)
Neutro Abs: 2.2 10*3/uL (ref 1.4–7.7)
Neutrophils Relative %: 51.8 % (ref 43.0–77.0)
Platelets: 129 10*3/uL — ABNORMAL LOW (ref 150.0–400.0)
RBC: 4.16 Mil/uL (ref 3.87–5.11)
RDW: 13.6 % (ref 11.5–15.5)
WBC: 4.2 10*3/uL (ref 4.0–10.5)

## 2022-03-20 LAB — BASIC METABOLIC PANEL
BUN: 24 mg/dL — ABNORMAL HIGH (ref 6–23)
CO2: 34 mEq/L — ABNORMAL HIGH (ref 19–32)
Calcium: 9.6 mg/dL (ref 8.4–10.5)
Chloride: 101 mEq/L (ref 96–112)
Creatinine, Ser: 0.83 mg/dL (ref 0.40–1.20)
GFR: 70.52 mL/min (ref >60.00)
Glucose, Bld: 81 mg/dL (ref 70–99)
Potassium: 4.1 mEq/L (ref 3.5–5.1)
Sodium: 140 mEq/L (ref 135–145)

## 2022-03-20 LAB — FERRITIN: Ferritin: 133.8 ng/mL (ref 10.0–291.0)

## 2022-03-20 LAB — IRON: Iron: 168 ug/dL — ABNORMAL HIGH (ref 42–145)

## 2022-03-20 MED ORDER — SHINGRIX 50 MCG/0.5ML IM SUSR: 0.5000 mL | Freq: Once | INTRAMUSCULAR | 1 refills | Status: AC

## 2022-03-20 MED ORDER — FUROSEMIDE 40 MG PO TABS: 60.0000 mg | ORAL_TABLET | Freq: Every day | ORAL | Status: DC

## 2022-03-20 NOTE — Patient Instructions (Addendum)
Continue Lasix 40 mg: 1.5 tablets daily (60 mg daily) ?Continue taking potassium as you are doing. ?Depending on the results of your blood work, I might ask you to decrease the potassium you are taking. ?Continue checking your weight at home, if you notice more than 5 pounds of weight increase in 1 week call me or your cardiologist. ? ? ?Schedule ultrasound of your thyroid at the first floor ? ?We are referring you to a podiatrist for evaluation of feet pain. ? ? ?Recommend to proceed with the following vaccines at your pharmacy:  ?Covid booster (bivalent) ?Shingrix (shingles)- see printed prescription  ? ? ?Check the  blood pressure regularly ?BP GOAL is between 110/65 and  135/85. ?If it is consistently higher or lower, let me know ?  ? ?GO TO THE LAB : Get the blood work   ? ? ?[place], [place] ?Come back for a CPX months ?

## 2022-03-20 NOTE — Progress Notes (Addendum)
Subjective:    Patient ID: Brittney Walker, female    DOB: 10-23-1951, 71 y.o.   MRN: [id number]  DOS:  03/20/2022 Type of visit - description: Follow-up    Today with the address her chronic medical problems. She had procedures done, results reviewed.  Her main concern is pain, mostly at the feet and pretibial area. Denies claudication per se, in fact pain is worse when she is sitting down. This is going on for more than a year. No back pain per se.  She is not taking Lasix as prescribed.  Denies edema or increased weight on her home scales.   However in our scales she has gained approximately 4 pounds in the last 3 months.  Good anticoagulation compliance with no nausea vomiting.  No diarrhea or blood in the stools.  No abdominal pain.  Denies chest pain, difficulty breathing.  Wt Readings from Last 3 Encounters:  03/20/22 218 lb (98.9 kg)  01/24/22 210 lb (95.3 kg)  12/25/21 214 lb 8.1 oz (97.3 kg)      Review of Systems See above   Past Medical History:  Diagnosis Date   Atrial fibrillation ([place]) 10/04/2021   Carotid bruit    w/ neg dopplers    CHF (congestive heart failure) ([place])    History of gastric ulcer 10/04/2021   Hyperthyroidism     s/p RAI 2004   Normocytic anemia    Persistent atrial fibrillation ([place])    a. Dx 07/2021; b. CHA2DS2VASc = 3-->Eliquis; c. 10/2021 s/p DCCV.   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   Vitamin D deficiency     Past Surgical History:  Procedure Laterality Date   ABDOMINAL HYSTERECTOMY  11/05/1995   TAH-LEIOMYOMATA-- no BSO   BIOPSY  12/25/2021   Procedure: BIOPSY;  Surgeon: Doran Stabler, MD;  Location: [facility];  Service: Gastroenterology;;   BIOPSY THYROID      bx of largest nodule 2005, benign   BREAST SURGERY     x's 2--fibrocystic dz   CARDIOVERSION N/A 10/10/2021   Procedure: CARDIOVERSION;  Surgeon: Sanda Klein, MD;  Location: [facility];  Service: Cardiovascular;  Laterality: N/A;    CATARACT EXTRACTION, BILATERAL Bilateral    COLONOSCOPY WITH PROPOFOL N/A 12/25/2021   Procedure: COLONOSCOPY WITH PROPOFOL;  Surgeon: Doran Stabler, MD;  Location: [facility];  Service: Gastroenterology;  Laterality: N/A;   ESOPHAGOGASTRODUODENOSCOPY (EGD) WITH PROPOFOL N/A 10/12/2021   Procedure: ESOPHAGOGASTRODUODENOSCOPY (EGD) WITH PROPOFOL;  Surgeon: Doran Stabler, MD;  Location: [facility];  Service: Gastroenterology;  Laterality: N/A;   ESOPHAGOGASTRODUODENOSCOPY (EGD) WITH PROPOFOL N/A 12/25/2021   Procedure: ESOPHAGOGASTRODUODENOSCOPY (EGD) WITH PROPOFOL;  Surgeon: Doran Stabler, MD;  Location: [facility];  Service: Gastroenterology;  Laterality: N/A;   POLYPECTOMY  12/25/2021   Procedure: POLYPECTOMY;  Surgeon: Doran Stabler, MD;  Location: [facility];  Service: Gastroenterology;;   TONSILLECTOMY      Current Outpatient Medications  Medication Instructions   apixaban (ELIQUIS) 5 mg, Oral, 2 times daily   calcium carbonate (OS-CAL - DOSED IN MG OF ELEMENTAL CALCIUM) 1,250 mg, Oral, Daily with breakfast   cholecalciferol (VITAMIN D) 1,000 Units, Daily   ferrous sulfate 325 mg, Oral, 2 times daily with meals   furosemide (LASIX) 60 mg, Oral, Daily   gatifloxacin (ZYMAXID) 0.5 % SOLN 1 drop, Right Eye, 4 times daily   ketorolac (ACULAR) 0.5 % ophthalmic solution 1 drop, Right Eye, 4 times daily   Multiple Vitamin (  MULTIVITAMIN WITH MINERALS) TABS tablet 1 tablet, Oral, Every morning, Alive   omeprazole (PRILOSEC) 40 mg, Oral, 2 times daily   potassium chloride SA (KLOR-CON M) 20 MEQ tablet 20 mEq, Oral, 2 times daily   prednisoLONE acetate (PRED FORTE) 1 % ophthalmic suspension 1 drop, Right Eye, 4 times daily   vitamin A 10,000 Units, Daily   vitamin B-12 (CYANOCOBALAMIN) 1,000 mcg, Oral, Daily   vitamin C 1,000 mg, Daily   vitamin E 400 Units, Daily   zinc gluconate 50 mg, Oral, Daily   Zoster Vaccine Adjuvanted (SHINGRIX) injection 0.5 mLs,  Intramuscular,  Once       Objective:   Physical Exam BP 128/62 (BP Location: Left Arm, Patient Position: Sitting, Cuff Size: Small)   Pulse (!) 58   Temp 97.7 F (36.5 C) (Oral)   Resp 18   Ht 5' 6.5" (1.689 m)   Wt 218 lb (98.9 kg)   LMP 11/05/1995   SpO2 98%   BMI 34.66 kg/m  General:   Well developed, NAD, BMI noted. HEENT:  Normocephalic . Face symmetric, atraumatic Neck: Right thyromegaly.  Nontender. Lungs:  CTA B Normal respiratory effort, no intercostal retractions, no accessory muscle use. Heart: RRR,  no murmur.  Lower extremities:  no pretibial edema bilaterally. Normal femoral and pedal pulses.   Flatfeet noted, no synovitis. Skin: Not pale. Not jaundice Neurologic:  alert & oriented X3.  Speech normal, gait appropriate for age and unassisted Psych--  Cognition and judgment appear intact.  Cooperative with normal attention span and concentration.  Behavior appropriate. No anxious or depressed appearing.      Assessment    Assessment Gout Thyroid disease,  -RAI 2004 - Bx 2005 neg -[place] 10-2014-- new nodule, Bx unsuccessful 12-2014 Morbid obesity  H/o  VIN I,  2000 Vitamin D deficiency Carotid bruit w/ negative Dopplers A- Fibrillation,Dx 07-2021, cardioverted 10/11/2021 CHF Dx 07-2021 Acute anemia due to H. pylori + gastric ulcer 10/2021   PLAN CHF, atrial fibrillation: She seems to be on sinus rhythm today,  self decrease Lasix from 60 mg BID to 60 mg QD; but did not change her potassium supplements dose. Has not gained weight on her own scales, no edema on exam, on the clinic scales she has gained 4 pounds. Plan: Continue 60 mg qd, monitor weight, continue potassium but will consider decreased dose.  Check BMP Anemia, + H. pylori:  12-25-21: Colonoscopy-tubular adenoma, internal hemorrhoids.  EGD-normal esophagus, normal duodenum, biopsies: Chronic gastritis, no H. pylori Still on iron supplements, check CBC, iron, ferritin.  Further peripheral  results Right thyroid nodule: Had a unsuccessful biopsy 2016, recheck ultrasound. Feet pain: No claudication, possibly related to flatfeet ?, refer to podiatry, might benefit from shoe insertions. Preventive care: Saw gynecology 01/24/2022. Shingrix prescription printed COVID-vaccine is an option RTC 4 months, CPX

## 2022-03-20 NOTE — Assessment & Plan Note (Addendum)
CHF, atrial fibrillation: She seems to be on sinus rhythm today,  self decrease Lasix from 60 mg BID to 60 mg QD; but did not change her potassium supplements dose. Has not gained weight on her own scales, no edema on exam, on the clinic scales she has gained 4 pounds. Plan: Continue 60 mg qd, monitor weight, continue potassium but will consider decreased dose.  Check BMP Anemia, + H. pylori:  12-25-21: Colonoscopy-tubular adenoma, internal hemorrhoids.  EGD-normal esophagus, normal duodenum, biopsies: Chronic gastritis, no H. pylori Still on iron supplements, check CBC, iron, ferritin.  Further peripheral results Right thyroid nodule: Had a unsuccessful biopsy 2016, recheck ultrasound. Feet pain: No claudication, possibly related to flatfeet ?, refer to podiatry, might benefit from shoe insertions. Preventive care: Saw gynecology 01/24/2022. Shingrix prescription printed COVID-vaccine is an option RTC 4 months, CPX

## 2022-03-21 ENCOUNTER — Other Ambulatory Visit: Payer: Self-pay | Admitting: Gastroenterology

## 2022-03-25 ENCOUNTER — Other Ambulatory Visit: Payer: Self-pay

## 2022-03-25 MED ORDER — APIXABAN 5 MG PO TABS: 5.0000 mg | ORAL_TABLET | Freq: Two times a day (BID) | ORAL | 1 refills | Status: DC

## 2022-03-25 NOTE — Telephone Encounter (Signed)
Prescription refill request for Eliquis received. Indication:afib Last office visit:croitru 10/25/21 Scr:0.82 12/21/21 Age: 42fWeight:98.9kg

## 2022-03-26 ENCOUNTER — Other Ambulatory Visit: Payer: Self-pay | Admitting: Gastroenterology

## 2022-03-27 ENCOUNTER — Telehealth: Payer: Self-pay | Admitting: Gastroenterology

## 2022-03-27 MED ORDER — OMEPRAZOLE 40 MG PO CPDR: 40.0000 mg | DELAYED_RELEASE_CAPSULE | Freq: Every day | ORAL | 0 refills | Status: DC

## 2022-03-27 NOTE — Telephone Encounter (Signed)
Patient is returning your call.  

## 2022-03-27 NOTE — Telephone Encounter (Signed)
Omeprazole 40 mg once daily, dispense 90, 0 refill  Please arrange office follow-up with me.  HD

## 2022-03-27 NOTE — Telephone Encounter (Signed)
Follow up has been scheduled for 04-26-22. Refill has been given as instructed.

## 2022-03-27 NOTE — Telephone Encounter (Signed)
Patient is requesting a refill on Pantoprazole also requested a call once done.

## 2022-03-27 NOTE — Telephone Encounter (Signed)
Tried to reach patient via phone number on file, no answer and cannot leave a voicemail. Will try to contact again later.

## 2022-03-27 NOTE — Telephone Encounter (Signed)
After chart review, I see where after her procedures she was instructed to take pantoprazole bid for 4 weeks then stop. She then requested it changed to omeprazole bid due to cost. I called and spoke to Jakelin, she states that with out taking the omeprazole her symptoms of stomach pains and reflux return. She requests to go back on pantoprazole, please advise

## 2022-03-28 ENCOUNTER — Ambulatory Visit (HOSPITAL_BASED_OUTPATIENT_CLINIC_OR_DEPARTMENT_OTHER): Admission: RE | Admit: 2022-03-28 | Payer: 59 | Source: Ambulatory Visit

## 2022-04-02 ENCOUNTER — Ambulatory Visit (HOSPITAL_BASED_OUTPATIENT_CLINIC_OR_DEPARTMENT_OTHER)
Admission: RE | Admit: 2022-04-02 | Discharge: 2022-04-02 | Disposition: A | Discharge status: Home / Self Care | Payer: 59 | Source: Ambulatory Visit | Attending: Internal Medicine | Admitting: Internal Medicine

## 2022-04-02 DIAGNOSIS — E041 Nontoxic single thyroid nodule: Secondary | ICD-10-CM | POA: Diagnosis present

## 2022-04-08 DIAGNOSIS — Z006 Encounter for examination for normal comparison and control in clinical research program: Secondary | ICD-10-CM

## 2022-04-08 NOTE — Research (Signed)
Attempted to reach patient x 3 to schedule 6 month follow-up for Alleviate Research Study. No voicemail available to leave a message.

## 2022-04-09 ENCOUNTER — Ambulatory Visit: Payer: 59 | Admitting: Podiatry

## 2022-04-11 ENCOUNTER — Ambulatory Visit (INDEPENDENT_AMBULATORY_CARE_PROVIDER_SITE_OTHER): Payer: 59

## 2022-04-11 ENCOUNTER — Ambulatory Visit (INDEPENDENT_AMBULATORY_CARE_PROVIDER_SITE_OTHER): Payer: 59 | Admitting: Podiatry

## 2022-04-11 DIAGNOSIS — I999 Unspecified disorder of circulatory system: Secondary | ICD-10-CM

## 2022-04-11 DIAGNOSIS — M2141 Flat foot [pes planus] (acquired), right foot: Secondary | ICD-10-CM

## 2022-04-11 DIAGNOSIS — M7752 Other enthesopathy of left foot: Secondary | ICD-10-CM | POA: Diagnosis not present

## 2022-04-11 DIAGNOSIS — M79674 Pain in right toe(s): Secondary | ICD-10-CM

## 2022-04-11 DIAGNOSIS — B351 Tinea unguium: Secondary | ICD-10-CM | POA: Diagnosis not present

## 2022-04-11 DIAGNOSIS — M7751 Other enthesopathy of right foot: Secondary | ICD-10-CM

## 2022-04-11 DIAGNOSIS — M2142 Flat foot [pes planus] (acquired), left foot: Secondary | ICD-10-CM

## 2022-04-11 DIAGNOSIS — M79675 Pain in left toe(s): Secondary | ICD-10-CM

## 2022-04-11 DIAGNOSIS — M779 Enthesopathy, unspecified: Secondary | ICD-10-CM

## 2022-04-11 MED ORDER — TRIAMCINOLONE ACETONIDE 10 MG/ML IJ SUSP
20.0000 mg | Freq: Once | INTRAMUSCULAR | Status: AC
  Administered 2022-04-11: 20 mg

## 2022-04-12 NOTE — Progress Notes (Signed)
Subjective:   Patient ID: Brittney Walker, female   DOB: 71 y.o.   MRN: [id number]   HPI Patient presents stating that she gets pain in her ankles of both feet and her nails all are bothering her.  She also gets pain in her legs if she tries to be active and is not able to walk distances and mostly in the lower legs but also some in her thighs.  Patient is 71 years old well oriented does not smoke   Review of Systems  All other systems reviewed and are negative.       Objective:  Physical Exam Vitals and nursing note reviewed.  Constitutional:      Appearance: She is well-developed.  Pulmonary:     Effort: Pulmonary effort is normal.  Musculoskeletal:        General: Normal range of motion.  Skin:    General: Skin is warm.  Neurological:     Mental Status: She is alert.     Vascular status indicates no palpable pulses bilateral.  I was not able to palpate PT or DP and again did find multiple signs of claudication-like symptomatology with questioning.  She is got quite a bit of pain in the subtalar joint of both feet and they do get inflamed and she does have thick yellow brittle nailbeds 1-5 both feet that get incurvated in the corners and are sore and hard for her to cut.  She also has history of A-fib and is on blood thinner     Assessment:  Concerned about vascular status and the possibility for vascular occlusion and I am sending for ABI testing.  I reviewed all conditions with her and discussed her conditions I am not can to be able to improve too much but I am good to try to reduce the inflammation and I did inject after sterile prep the sinus tarsi bilateral 3 mg Kenalog 5 mg Xylocaine.  I then debrided nailbeds 1-5 both feet with no iatrogenic bleeding and I discussed with her the results of vascular will dictate whether or not anything can be done to help the condition and I cannot make any kind of determination at this point     Plan:  Above reviewed by my plan and I  did again discuss everything as far as her overall health and we will get the results of her ABIs and decide what else may be necessary  X-rays do indicate mild arthritis but I did not note anything severe or anything that is concerning from a structural bony perspective

## 2022-04-13 ENCOUNTER — Other Ambulatory Visit: Payer: Self-pay | Admitting: Podiatry

## 2022-04-13 DIAGNOSIS — M779 Enthesopathy, unspecified: Secondary | ICD-10-CM

## 2022-04-17 ENCOUNTER — Other Ambulatory Visit: Payer: Self-pay | Admitting: Podiatry

## 2022-04-17 DIAGNOSIS — I999 Unspecified disorder of circulatory system: Secondary | ICD-10-CM

## 2022-04-19 ENCOUNTER — Ambulatory Visit (HOSPITAL_COMMUNITY)
Admission: RE | Admit: 2022-04-19 | Discharge: 2022-04-19 | Disposition: A | Discharge status: Home / Self Care | Payer: 59 | Source: Ambulatory Visit | Attending: Cardiology | Admitting: Cardiology

## 2022-04-19 DIAGNOSIS — M79652 Pain in left thigh: Secondary | ICD-10-CM | POA: Diagnosis not present

## 2022-04-19 DIAGNOSIS — I999 Unspecified disorder of circulatory system: Secondary | ICD-10-CM | POA: Insufficient documentation

## 2022-04-24 VITALS — BP 132/53 | HR 50 | Resp 14 | Wt 215.0 lb

## 2022-04-24 DIAGNOSIS — Z006 Encounter for examination for normal comparison and control in clinical research program: Secondary | ICD-10-CM

## 2022-04-24 MED ORDER — FUROSEMIDE 20 MG PO TABS: ORAL_TABLET | ORAL | 3 refills | Status: DC

## 2022-04-24 MED ORDER — POTASSIUM CHLORIDE ER 10 MEQ PO TBCR: EXTENDED_RELEASE_TABLET | ORAL | 3 refills | Status: DC

## 2022-04-24 NOTE — Research (Signed)
Alleviate HF Research Study 6 Month Follow-up  No medication changes or adverse events to report at this time.  Labs drawn 6 MHWT completed EQ 5D 5L completed: See worksheet NYHA ll   Current Outpatient Medications:    apixaban (ELIQUIS) 5 MG TABS tablet, Take 1 tablet (5 mg total) by mouth 2 (two) times daily., Disp: 180 tablet, Rfl: 1   Ascorbic Acid (VITAMIN C) 1000 MG tablet, Take 1,000 mg by mouth daily., Disp: , Rfl:    calcium carbonate (OS-CAL - DOSED IN MG OF ELEMENTAL CALCIUM) 1250 MG tablet, Take 1,250 mg by mouth daily with breakfast., Disp: , Rfl:    cholecalciferol (VITAMIN D) 1000 UNITS tablet, Take 1,000 Units by mouth daily., Disp: , Rfl:    furosemide (LASIX) 20 MG tablet, As needed patient may take 60 MG Lasix PRN by mouth QD x 4 days as directed per Alleviate Research HF Study PRN plan., Disp: 90 tablet, Rfl: 3   furosemide (LASIX) 40 MG tablet, Take 1.5 tablets (60 mg total) by mouth daily., Disp: , Rfl:    gatifloxacin (ZYMAXID) 0.5 % SOLN, Place 1 drop into the right eye 4 (four) times daily., Disp: , Rfl:    ketorolac (ACULAR) 0.5 % ophthalmic solution, Place 1 drop into the right eye 4 (four) times daily., Disp: , Rfl:    Multiple Vitamin (MULTIVITAMIN WITH MINERALS) TABS tablet, Take 1 tablet by mouth in the morning. Alive, Disp: , Rfl:    omeprazole (PRILOSEC) 40 MG capsule, Take 1 capsule (40 mg total) by mouth daily., Disp: 90 capsule, Rfl: 0   potassium chloride (KLOR-CON) 10 MEQ tablet, As needed patient may take Potassium 30 mEq PRN by mouth QD x 4 days as directed per Alleviate Research HF Study PRN plan., Disp: 90 tablet, Rfl: 3   potassium chloride SA (KLOR-CON M) 20 MEQ tablet, Take 1 tablet (20 mEq total) by mouth 2 (two) times daily., Disp: 60 tablet, Rfl: 6   prednisoLONE acetate (PRED FORTE) 1 % ophthalmic suspension, Place 1 drop into the right eye 4 (four) times daily., Disp: , Rfl:    vitamin A 10000 UNIT capsule, Take 10,000 Units by mouth daily.,  Disp: , Rfl:    vitamin B-12 (CYANOCOBALAMIN) 1000 MCG tablet, Take 1,000 mcg by mouth daily., Disp: , Rfl:    vitamin E 400 UNIT capsule, Take 400 Units by mouth daily., Disp: , Rfl:    zinc gluconate 50 MG tablet, Take 50 mg by mouth daily., Disp: , Rfl:    ferrous sulfate 325 (65 FE) MG tablet, Take 1 tablet (325 mg total) by mouth 2 (two) times daily with a meal. (Patient not taking: Reported on 04/24/2022), Disp: 60 tablet, Rfl: 3

## 2022-04-26 ENCOUNTER — Encounter: Payer: Self-pay | Admitting: Gastroenterology

## 2022-04-26 ENCOUNTER — Ambulatory Visit (INDEPENDENT_AMBULATORY_CARE_PROVIDER_SITE_OTHER): Payer: 59 | Admitting: Gastroenterology

## 2022-04-26 VITALS — BP 124/62 | HR 64 | Ht 65.75 in | Wt 218.2 lb

## 2022-04-26 DIAGNOSIS — D500 Iron deficiency anemia secondary to blood loss (chronic): Secondary | ICD-10-CM

## 2022-04-26 DIAGNOSIS — R12 Heartburn: Secondary | ICD-10-CM | POA: Diagnosis not present

## 2022-04-26 DIAGNOSIS — A048 Other specified bacterial intestinal infections: Secondary | ICD-10-CM | POA: Diagnosis not present

## 2022-04-26 DIAGNOSIS — K254 Chronic or unspecified gastric ulcer with hemorrhage: Secondary | ICD-10-CM | POA: Diagnosis not present

## 2022-05-09 LAB — BASIC METABOLIC PANEL
BUN/Creatinine Ratio: 27 (ref 12–28)
BUN: 22 mg/dL (ref 8–27)
CO2: 23 mmol/L (ref 20–29)
Calcium: 10.1 mg/dL (ref 8.7–10.3)
Chloride: 100 mmol/L (ref 96–106)
Creatinine, Ser: 0.83 mg/dL (ref 0.57–1.00)
Glucose: 106 mg/dL — ABNORMAL HIGH (ref 70–99)
Potassium: 5 mmol/L (ref 3.5–5.2)
Sodium: 142 mmol/L (ref 134–144)
eGFR: 75 mL/min/{1.73_m2} (ref >59)

## 2022-05-09 LAB — CBC
Hematocrit: 40.4 % (ref 34.0–46.6)
Hemoglobin: 14 g/dL (ref 11.1–15.9)
MCH: 31.6 pg (ref 26.6–33.0)
MCHC: 34.7 g/dL (ref 31.5–35.7)
MCV: 91 fL (ref 79–97)
Platelets: 143 10*3/uL — ABNORMAL LOW (ref 150–450)
RBC: 4.43 x10E6/uL (ref 3.77–5.28)
RDW: 13.1 % (ref 11.7–15.4)
WBC: 4.7 10*3/uL (ref 3.4–10.8)

## 2022-05-09 LAB — BRAIN NATRIURETIC PEPTIDE

## 2022-05-14 ENCOUNTER — Encounter: Payer: Self-pay | Admitting: *Deleted

## 2022-06-12 DIAGNOSIS — Z006 Encounter for examination for normal comparison and control in clinical research program: Secondary | ICD-10-CM

## 2022-06-12 MED ORDER — POTASSIUM CHLORIDE ER 10 MEQ PO TBCR: EXTENDED_RELEASE_TABLET | ORAL | 1 refills | Status: DC

## 2022-06-12 NOTE — Research (Addendum)
Alleviate HF Research Study  8 Month Follow-up  No adverse events to report at this time.Will follow up with patient at 10 month post implant.   Current Outpatient Medications:    apixaban (ELIQUIS) 5 MG TABS tablet, Take 1 tablet (5 mg total) by mouth 2 (two) times daily., Disp: 180 tablet, Rfl: 1   Ascorbic Acid (VITAMIN C) 1000 MG tablet, Take 1,000 mg by mouth daily., Disp: , Rfl:    calcium carbonate (OS-CAL - DOSED IN MG OF ELEMENTAL CALCIUM) 1250 MG tablet, Take 1,250 mg by mouth daily with breakfast., Disp: , Rfl:    cholecalciferol (VITAMIN D) 1000 UNITS tablet, Take 1,000 Units by mouth daily., Disp: , Rfl:    ferrous sulfate 325 (65 FE) MG tablet, Take 1 tablet (325 mg total) by mouth 2 (two) times daily with a meal., Disp: 60 tablet, Rfl: 3   furosemide (LASIX) 20 MG tablet, As needed patient may take 60 MG Lasix PRN by mouth QD x 4 days as directed per Alleviate Research HF Study PRN plan., Disp: 90 tablet, Rfl: 3   Multiple Vitamin (MULTIVITAMIN WITH MINERALS) TABS tablet, Take 1 tablet by mouth in the morning. Alive, Disp: , Rfl:    omeprazole (PRILOSEC) 40 MG capsule, Take 1 capsule (40 mg total) by mouth daily., Disp: 90 capsule, Rfl: 0   vitamin A 10000 UNIT capsule, Take 10,000 Units by mouth daily., Disp: , Rfl:    vitamin B-12 (CYANOCOBALAMIN) 1000 MCG tablet, Take 1,000 mcg by mouth daily., Disp: , Rfl:    vitamin E 400 UNIT capsule, Take 400 Units by mouth daily., Disp: , Rfl:    zinc gluconate 50 MG tablet, Take 50 mg by mouth daily., Disp: , Rfl:

## 2022-06-28 ENCOUNTER — Other Ambulatory Visit: Payer: Self-pay | Admitting: Gastroenterology

## 2022-07-09 ENCOUNTER — Telehealth: Payer: Self-pay | Admitting: *Deleted

## 2022-07-09 NOTE — Telephone Encounter (Signed)
Patient walked into the office  - states her  blood reading  were not  coming up - the word "low" registered.  She states it happen several times today.  Patient could not give me any  other details. Other than she has headache sometimes.  RN  reassured her that her machine maybe incorrect in calibration of her blood pressure .   Office would like several reading to do any possible changes to her medications  Patient states she only taking Eliquis , Furosemide. Patient last visit was 09/25/21 with Dr Sallyanne Kuster.  She also was seen at the afib clinic 09/12/21.  Patient would like an appointment verify blood pressure -  appointment made-07/11/22 at 2:45 am instructed patient to bring her own  blood pressure machine  and medication to visit    Patient voiced understanding.

## 2022-07-11 ENCOUNTER — Ambulatory Visit: Payer: 59 | Attending: Nurse Practitioner | Admitting: Nurse Practitioner

## 2022-07-11 NOTE — Progress Notes (Deleted)
Office Visit    Patient Name: Brittney Walker Date of Encounter: 07/11/2022  Primary Care Provider:  Colon Branch, MD Primary Cardiologist:  Sanda Klein, MD  Chief Complaint    71 year old female with a history of persistent atrial fibrillation, right-sided heart failure, ILR, iron deficiency anemia, gastric ulcers, and obesity who presents for follow-up related to atrial fibrillation.  Past Medical History    Past Medical History:  Diagnosis Date   Atrial fibrillation ([place]) 10/04/2021   Carotid bruit    w/ neg dopplers    CHF (congestive heart failure) ([place])    History of gastric ulcer 10/04/2021   Hyperthyroidism     s/p RAI 2004   Normocytic anemia    Persistent atrial fibrillation ([place])    a. Dx 07/2021; b. CHA2DS2VASc = 3-->Eliquis; c. 10/2021 s/p DCCV.   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   Vitamin D deficiency    Past Surgical History:  Procedure Laterality Date   ABDOMINAL HYSTERECTOMY  11/05/1995   TAH-LEIOMYOMATA-- no BSO   BIOPSY  12/25/2021   Procedure: BIOPSY;  Surgeon: Doran Stabler, MD;  [facility]: [facility];  Service: Gastroenterology;;   BIOPSY THYROID      bx of largest nodule 2005, benign   BREAST SURGERY     x's 2--fibrocystic dz   CARDIOVERSION N/A 10/10/2021   Procedure: CARDIOVERSION;  Surgeon: Sanda Klein, MD;  Location: [facility];  Service: Cardiovascular;  Laterality: N/A;   CATARACT EXTRACTION, BILATERAL Bilateral    COLONOSCOPY WITH PROPOFOL N/A 12/25/2021   Procedure: COLONOSCOPY WITH PROPOFOL;  Surgeon: Doran Stabler, MD;  [facility];  Service: Gastroenterology;  Laterality: N/A;   ESOPHAGOGASTRODUODENOSCOPY (EGD) WITH PROPOFOL N/A 10/12/2021   Procedure: ESOPHAGOGASTRODUODENOSCOPY (EGD) WITH PROPOFOL;  Surgeon: Doran Stabler, MD;  Location: [facility];  Service: Gastroenterology;  Laterality: N/A;   ESOPHAGOGASTRODUODENOSCOPY (EGD) WITH PROPOFOL N/A 12/25/2021   Procedure:  ESOPHAGOGASTRODUODENOSCOPY (EGD) WITH PROPOFOL;  Surgeon: Doran Stabler, MD;  [facility];  Service: Gastroenterology;  Laterality: N/A;   POLYPECTOMY  12/25/2021   Procedure: POLYPECTOMY;  Surgeon: Doran Stabler, MD;  [facility];  Service: Gastroenterology;;   TONSILLECTOMY      Allergies  No Known Allergies  History of Present Illness    71 year old female with the above past medical history including persistent atrial fibrillation, right-sided heart failure, ILR, iron deficiency anemia, gastric ulcers, and obesity.  She was diagnosed with persistent atrial fibrillation in 2022 and an ED visit for progressive dyspnea, lower extremity edema.  She underwent DCCV in 10/2021.  Following the cardioversion she was noted to be severely anemic and underwent upper endoscopy that showed large gastric ulcer, H. pylori positive.  Echocardiogram showed dilated right heart chambers without evidence of left to right shunt, mild to moderate reduced RV systolic function, severely dilated right atrium, and severe tricuspid regurgitation.  Labs were consistent with iron deficiency anemia.  Other symptoms improved with diuretics.  She was started on Eliquis.  She was enrolled in the alleviate HF trial and underwent ILR insertion.  Seen in the office on 10/25/2021 was stable from a cardiac standpoint.  She was maintaining sinus rhythm at the time.  Outpatient sleep study was recommended once she obtained full insurance coverage.  She presents today for follow-up.  Since her last visit  Persistent atrial fibrillation: Chronic right-sided heart failure: S/p loop recorder: IDA: History of gastric ulcer, H. pylori positive: Obesity: Screening for sleep  apnea: Disposition:  Home Medications    Current Outpatient Medications  Medication Sig Dispense Refill   apixaban (ELIQUIS) 5 MG TABS tablet Take 1 tablet (5 mg total) by mouth 2 (two) times daily. 180 tablet 1   Ascorbic  Acid (VITAMIN C) 1000 MG tablet Take 1,000 mg by mouth daily.     calcium carbonate (OS-CAL - DOSED IN MG OF ELEMENTAL CALCIUM) 1250 MG tablet Take 1,250 mg by mouth daily with breakfast.     cholecalciferol (VITAMIN D) 1000 UNITS tablet Take 1,000 Units by mouth daily.     ferrous sulfate 325 (65 FE) MG tablet Take 1 tablet (325 mg total) by mouth 2 (two) times daily with a meal. 60 tablet 3   furosemide (LASIX) 20 MG tablet As needed patient may take 60 MG Lasix PRN by mouth QD x 4 days as directed per Alleviate Research HF Study PRN plan. 90 tablet 3   Multiple Vitamin (MULTIVITAMIN WITH MINERALS) TABS tablet Take 1 tablet by mouth in the morning. Alive     omeprazole (PRILOSEC) 40 MG capsule TAKE 1 CAPSULE (40 MG TOTAL) BY MOUTH DAILY. 90 capsule 0   potassium chloride (KLOR-CON) 10 MEQ tablet Patient may take additional 30 mEq (1 tablet) potassium BY MOUTH DAILY AS DIRECTED for Alleviate HF Research Study   90 tablet 1   vitamin A 10000 UNIT capsule Take 10,000 Units by mouth daily.     vitamin B-12 (CYANOCOBALAMIN) 1000 MCG tablet Take 1,000 mcg by mouth daily.     vitamin E 400 UNIT capsule Take 400 Units by mouth daily.     zinc gluconate 50 MG tablet Take 50 mg by mouth daily.     No current facility-administered medications for this visit.     Review of Systems    ***.  All other systems reviewed and are otherwise negative except as noted above.    Physical Exam    VS:  LMP 11/05/1995  , BMI There is no height or weight on file to calculate BMI.     GEN: Well nourished, well developed, in no acute distress. HEENT: normal. Neck: Supple, no JVD, carotid bruits, or masses. Cardiac: RRR, no murmurs, rubs, or gallops. No clubbing, cyanosis, edema.  Radials/DP/PT 2+ and equal bilaterally.  Respiratory:  Respirations regular and unlabored, clear to auscultation bilaterally. GI: Soft, nontender, nondistended, BS + x 4. MS: no deformity or atrophy. Skin: warm and dry, no  rash. Neuro:  Strength and sensation are intact. Psych: Normal affect.  Accessory Clinical Findings    ECG personally reviewed by me today - *** - no acute changes.   Lab Results  Component Value Date   WBC 4.7 04/24/2022   HGB 14.0 04/24/2022   HCT 40.4 04/24/2022   MCV 91 04/24/2022   PLT 143 (L) 04/24/2022   Lab Results  Component Value Date   CREATININE 0.83 04/24/2022   BUN 22 04/24/2022   NA 142 04/24/2022   K 5.0 04/24/2022   CL 100 04/24/2022   CO2 23 04/24/2022   Lab Results  Component Value Date   ALT 20 12/21/2021   AST 24 12/21/2021   ALKPHOS 77 12/21/2021   BILITOT 0.6 12/21/2021   Lab Results  Component Value Date   CHOL 180 12/21/2021   HDL 63.40 12/21/2021   LDLCALC 105 (H) 12/21/2021   LDLDIRECT 141.1 05/01/2009   TRIG 60.0 12/21/2021   CHOLHDL 3 12/21/2021    Lab Results  Component Value Date  HGBA1C 5.6 12/21/2021    Assessment & Plan    1.  ***  No BP recorded.  {Refresh Note OR Click here to enter BP  :1}***   Lenna Sciara, NP 07/11/2022, 6:44 AM

## 2022-07-30 ENCOUNTER — Encounter: Payer: 59 | Admitting: Internal Medicine

## 2022-07-31 ENCOUNTER — Encounter: Payer: Self-pay | Admitting: Internal Medicine

## 2022-07-31 ENCOUNTER — Ambulatory Visit (INDEPENDENT_AMBULATORY_CARE_PROVIDER_SITE_OTHER): Payer: 59 | Admitting: Internal Medicine

## 2022-07-31 ENCOUNTER — Ambulatory Visit (INDEPENDENT_AMBULATORY_CARE_PROVIDER_SITE_OTHER): Payer: 59 | Admitting: *Deleted

## 2022-07-31 VITALS — BP 126/68 | HR 44 | Temp 98.0°F | Resp 18 | Ht 65.75 in | Wt 228.0 lb

## 2022-07-31 VITALS — BP 126/68 | HR 44 | Ht 65.75 in | Wt 228.0 lb

## 2022-07-31 DIAGNOSIS — E790 Hyperuricemia without signs of inflammatory arthritis and tophaceous disease: Secondary | ICD-10-CM

## 2022-07-31 DIAGNOSIS — Z23 Encounter for immunization: Secondary | ICD-10-CM | POA: Diagnosis not present

## 2022-07-31 DIAGNOSIS — E559 Vitamin D deficiency, unspecified: Secondary | ICD-10-CM | POA: Diagnosis not present

## 2022-07-31 DIAGNOSIS — I4819 Other persistent atrial fibrillation: Secondary | ICD-10-CM | POA: Diagnosis not present

## 2022-07-31 DIAGNOSIS — E039 Hypothyroidism, unspecified: Secondary | ICD-10-CM | POA: Diagnosis not present

## 2022-07-31 DIAGNOSIS — Z0000 Encounter for general adult medical examination without abnormal findings: Secondary | ICD-10-CM

## 2022-07-31 LAB — COMPREHENSIVE METABOLIC PANEL
ALT: 20 U/L (ref 0–35)
AST: 18 U/L (ref 0–37)
Albumin: 4.1 g/dL (ref 3.5–5.2)
Alkaline Phosphatase: 57 U/L (ref 39–117)
BUN: 18 mg/dL (ref 6–23)
CO2: 33 mEq/L — ABNORMAL HIGH (ref 19–32)
Calcium: 9.8 mg/dL (ref 8.4–10.5)
Chloride: 101 mEq/L (ref 96–112)
Creatinine, Ser: 0.87 mg/dL (ref 0.40–1.20)
GFR: 66.48 mL/min (ref >60.00)
Glucose, Bld: 96 mg/dL (ref 70–99)
Potassium: 4.3 mEq/L (ref 3.5–5.1)
Sodium: 138 mEq/L (ref 135–145)
Total Bilirubin: 0.7 mg/dL (ref 0.2–1.2)
Total Protein: 7.4 g/dL (ref 6.0–8.3)

## 2022-07-31 LAB — CBC WITH DIFFERENTIAL/PLATELET
Basophils Absolute: 0 10*3/uL (ref 0.0–0.1)
Basophils Relative: 0.2 % (ref 0.0–3.0)
Eosinophils Absolute: 0.2 10*3/uL (ref 0.0–0.7)
Eosinophils Relative: 2.9 % (ref 0.0–5.0)
HCT: 38.1 % (ref 36.0–46.0)
Hemoglobin: 12.8 g/dL (ref 12.0–15.0)
Lymphocytes Relative: 34 % (ref 12.0–46.0)
Lymphs Abs: 1.8 10*3/uL (ref 0.7–4.0)
MCHC: 33.7 g/dL (ref 30.0–36.0)
MCV: 94.1 fl (ref 78.0–100.0)
Monocytes Absolute: 0.7 10*3/uL (ref 0.1–1.0)
Monocytes Relative: 13.1 % — ABNORMAL HIGH (ref 3.0–12.0)
Neutro Abs: 2.7 10*3/uL (ref 1.4–7.7)
Neutrophils Relative %: 49.8 % (ref 43.0–77.0)
Platelets: 126 10*3/uL — ABNORMAL LOW (ref 150.0–400.0)
RBC: 4.05 Mil/uL (ref 3.87–5.11)
RDW: 13.1 % (ref 11.5–15.5)
WBC: 5.4 10*3/uL (ref 4.0–10.5)

## 2022-07-31 LAB — VITAMIN D 25 HYDROXY (VIT D DEFICIENCY, FRACTURES): VITD: 57.94 ng/mL (ref 30.00–100.00)

## 2022-07-31 LAB — URIC ACID: Uric Acid, Serum: 6.8 mg/dL (ref 2.4–7.0)

## 2022-07-31 LAB — TSH: TSH: 3.06 u[IU]/mL (ref 0.35–5.50)

## 2022-07-31 NOTE — Progress Notes (Addendum)
Subjective:   Brittney Walker is a 71 y.o. female who presents for Medicare Annual (Subsequent) preventive examination.  Review of Systems    Defer to PCP Cardiac Risk Factors include: advanced age (>45mn, >>36women);sedentary lifestyle     Objective:    Today's Vitals   07/31/22 0928  BP: 126/68  Pulse: (!) 44  Weight: 228 lb (103.4 kg)  Height: 5' 5.75" (1.67 m)   Body mass index is 37.08 kg/m.     07/31/2022   10:36 AM 12/25/2021    7:52 AM 10/12/2021    9:28 AM 10/10/2021   10:24 PM 10/10/2021   11:25 AM 09/03/2021   10:17 PM 07/17/2021    8:24 PM  Advanced Directives  Does Patient Have a Medical Advance Directive? No No No No No No No  Would patient like information on creating a medical advance directive? No - Patient declined No - Patient declined  Yes (Inpatient - patient requests chaplain consult to create a medical advance directive) No - Patient declined No - Patient declined No - Patient declined    Current Medications (verified) Outpatient Encounter Medications as of 07/31/2022  Medication Sig   apixaban (ELIQUIS) 5 MG TABS tablet Take 1 tablet (5 mg total) by mouth 2 (two) times daily.   Ascorbic Acid (VITAMIN C) 1000 MG tablet Take 1,000 mg by mouth daily. (Patient not taking: Reported on 07/31/2022)   calcium carbonate (OS-CAL - DOSED IN MG OF ELEMENTAL CALCIUM) 1250 MG tablet Take 1,250 mg by mouth daily with breakfast.   cholecalciferol (VITAMIN D) 1000 UNITS tablet Take 1,000 Units by mouth daily.   ferrous sulfate 325 (65 FE) MG tablet Take 1 tablet (325 mg total) by mouth 2 (two) times daily with a meal. (Patient not taking: Reported on 07/31/2022)   furosemide (LASIX) 20 MG tablet As needed patient may take 60 MG Lasix PRN by mouth QD x 4 days as directed per Alleviate Research HF Study PRN plan.   Multiple Vitamin (MULTIVITAMIN WITH MINERALS) TABS tablet Take 1 tablet by mouth in the morning. Alive   omeprazole (PRILOSEC) 40 MG capsule TAKE 1 CAPSULE  (40 MG TOTAL) BY MOUTH DAILY. (Patient not taking: Reported on 07/31/2022)   potassium chloride (KLOR-CON) 10 MEQ tablet Patient may take additional 30 mEq (1 tablet) potassium BY MOUTH DAILY AS DIRECTED for Alleviate HF Research Study   (Patient not taking: Reported on 07/31/2022)   vitamin A 10000 UNIT capsule Take 10,000 Units by mouth daily.   vitamin B-12 (CYANOCOBALAMIN) 1000 MCG tablet Take 1,000 mcg by mouth daily.   vitamin E 400 UNIT capsule Take 400 Units by mouth daily.   zinc gluconate 50 MG tablet Take 50 mg by mouth daily.   No facility-administered encounter medications on file as of 07/31/2022.    Allergies (verified) Patient has no known allergies.   History: Past Medical History:  Diagnosis Date   [place]rial fibrillation ([place] 10/04/2021   Carotid bruit    w/ neg dopplers    CHF (congestive heart failure) ([place]    History of gastric ulcer 10/04/2021   Hyperthyroidism     s/p RAI 2004   Normocytic anemia    [place]    a. Dx 07/2021; b. CHA2DS2VASc = 3-->Eliquis; c. 10/2021 s/p DCCV.   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   Vitamin D deficiency    Past Surgical History:  Procedure Laterality Date   ABDOMINAL HYSTERECTOMY  11/05/1995   TAH-LEIOMYOMATA-- no  BSO   BIOPSY  12/25/2021   Procedure: BIOPSY;  Surgeon: Doran Stabler, MD;  [facility];  Service: Gastroenterology;;   BIOPSY THYROID      bx of largest nodule 2005, benign   BREAST SURGERY     x's 2--fibrocystic dz   CARDIOVERSION N/A 10/10/2021   Procedure: CARDIOVERSION;  Surgeon: Sanda Klein, MD;  Location: [facility];  Service: Cardiovascular;  Laterality: N/A;   CATARACT EXTRACTION, BILATERAL Bilateral    COLONOSCOPY WITH PROPOFOL N/A 12/25/2021   Procedure: COLONOSCOPY WITH PROPOFOL;  Surgeon: Doran Stabler, MD;  [facility];  Service: Gastroenterology;  Laterality: N/A;   ESOPHAGOGASTRODUODENOSCOPY (EGD) WITH PROPOFOL  N/A 10/12/2021   Procedure: ESOPHAGOGASTRODUODENOSCOPY (EGD) WITH PROPOFOL;  Surgeon: Doran Stabler, MD;  Location: [facility];  Service: Gastroenterology;  Laterality: N/A;   ESOPHAGOGASTRODUODENOSCOPY (EGD) WITH PROPOFOL N/A 12/25/2021   Procedure: ESOPHAGOGASTRODUODENOSCOPY (EGD) WITH PROPOFOL;  Surgeon: Doran Stabler, MD;  [facility];  Service: Gastroenterology;  Laterality: N/A;   POLYPECTOMY  12/25/2021   Procedure: POLYPECTOMY;  Surgeon: Doran Stabler, MD;  Location: [facility];  Service: Gastroenterology;;   TONSILLECTOMY     Family History  Problem Relation Age of Onset   Seizures Father        Epilepsy   Hypertension Father    Heart attack Father        ?   Breast cancer Sister 85   Heart disease Mother    Hypertension Mother    Colon cancer Neg Hx    Pancreatic cancer Neg Hx    Esophageal cancer Neg Hx    Prostate cancer Neg Hx    Rectal cancer Neg Hx    Social History   Socioeconomic History   Marital status: Single    Spouse name: Not on file   Number of children: 0   Years of education: Not on file   Highest education level: Not on file  Occupational History   Occupation: retired---collections  Tobacco Use   Smoking status: Never   Smokeless tobacco: Never  Vaping Use   Vaping Use: Never used  Substance and Sexual Activity   Alcohol use: No    Alcohol/week: 0.0 standard drinks of alcohol   Drug use: No   Sexual activity: Never    Comment: [place]  Other Topics Concern   Not on file  Social History Narrative   Lives by herself    Social Determinants of Health   Financial Resource Strain: Medium Risk (07/31/2022)   Overall Financial Resource Strain (CARDIA)    Difficulty of Paying Living Expenses: Somewhat hard  Food Insecurity: No Food Insecurity (07/31/2022)   Hunger Vital Sign    Worried About Running Out of Food in the Last Year: Never true    Ran Out of Food in the Last Year: Never true  Transportation Needs: No  Transportation Needs (07/31/2022)   PRAPARE - Hydrologist (Medical): No    Lack of Transportation (Non-Medical): No  Physical Activity: Inactive (07/31/2022)   Exercise Vital Sign    Days of Exercise per Week: 0 days    Minutes of Exercise per Session: 0 min  Stress: No Stress Concern Present (07/31/2022)   [place]    Feeling of Stress : Not at all  Social Connections: Moderately Isolated (07/31/2022)   Social Connection and Isolation Panel [NHANES]    Frequency of Communication with  Friends and Family: Once a week    Frequency of Social Gatherings with Friends and Family: Once a week    Attends Religious Services: More than 4 times per year    Active Member of [employer] or Organizations: Yes    Attends Music therapist: More than 4 times per year    Marital Status: Never married    Tobacco Counseling Counseling given: Not Answered   Clinical Intake:  Pre-visit preparation completed: Yes  Pain : No/denies pain     Diabetes: No  How often do you need to have someone help you when you read instructions, pamphlets, or other written materials from your doctor or pharmacy?: 1 - Never  Diabetic? No   Activities of Daily Living    07/31/2022   10:41 AM 10/10/2021   10:24 PM  In your present state of health, do you have any difficulty performing the following activities:  Hearing? 0 0  Vision? 0 1  Difficulty concentrating or making decisions? 0 0  Walking or climbing stairs? 0 1  Dressing or bathing? 0 0  Doing errands, shopping? 0 1  Preparing Food and eating ? N   Using the Toilet? N   In the past six months, have you accidently leaked urine? N   Do you have problems with loss of bowel control? N   Managing your Medications? N   Managing your Finances? N   Housekeeping or managing your Housekeeping? N     Patient Care Team: Colon Branch, MD as PCP - General  (Internal Medicine) Croitoru, Dani Gobble, MD as PCP - Cardiology (Cardiology)  Indicate any recent Medical Services you may have received from other than [facility] providers in the past year (date may be approximate).     Assessment:   This is a routine wellness examination for [place].  Hearing/Vision screen No results found.  Dietary issues and exercise activities discussed: Current Exercise Habits: The patient does not participate in regular exercise at present, Exercise limited by: None identified   Goals Addressed   None    Depression Screen    07/31/2022    9:15 AM 12/21/2021   10:10 AM 12/09/2017    2:27 PM 12/03/2016    3:50 PM 10/12/2015    9:19 AM  PHQ 2/9 Scores  PHQ - 2 Score 0 0 0 0 0    Fall Risk    07/31/2022    9:15 AM 12/21/2021   10:10 AM 12/09/2017    2:27 PM 12/03/2016    3:50 PM 10/12/2015    9:19 AM  [place] in the past year? 0 0 No No No  Number falls in past yr: 0 0     Injury with Fall? 0 0     Follow up Falls evaluation completed Falls evaluation completed       [place]:  Any stairs in or around the home? Yes  If so, are there any without handrails? No  Home free of loose throw rugs in walkways, pet beds, electrical cords, etc? Yes  Adequate lighting in your home to reduce risk of falls? Yes   ASSISTIVE DEVICES UTILIZED TO PREVENT FALLS:  Life alert? No  Use of a cane, walker or w/c? No  Grab bars in the bathroom? No  Shower chair or bench in shower? No  Elevated toilet seat or a handicapped toilet? No   TIMED UP AND GO:  Was the test performed? Yes .  Length of time to ambulate 10 feet: 10 sec.   Gait slow and steady without use of assistive device  Cognitive Function:        07/31/2022   10:44 AM  6CIT Screen  What Year? 0 points  What month? 0 points  What time? 0 points  Count back from 20 0 points  Months in reverse 0 points  Repeat phrase 0 points  Total Score 0 points     Immunizations Immunization History  Administered Date(s) Administered   Fluad Quad(high Dose 65+) 10/11/2021, 07/31/2022   Influenza, High Dose Seasonal PF 12/09/2017   Influenza,inj,Quad PF,6+ Mos 10/14/2014, 10/12/2015, 12/03/2016   Janssen (J&J) SARS-COV-2 Vaccination 01/16/2020   PFIZER(Purple Top)SARS-COV-2 Vaccination 09/30/2020   PNEUMOCOCCAL CONJUGATE-20 12/21/2021   Pneumococcal Conjugate-13 12/03/2016   Pneumococcal Polysaccharide-23 10/12/2015   Td 06/27/2005, 12/03/2016    TDAP status: Up to date  Flu Vaccine status: Up to date  Pneumococcal vaccine status: Up to date  Covid-19 vaccine status: Information provided on how to obtain vaccines.   Qualifies for Shingles Vaccine? Yes   Zostavax completed No   Shingrix Completed?: No.    Education has been provided regarding the importance of this vaccine. Patient has been advised to call insurance company to determine out of pocket expense if they have not yet received this vaccine. Advised may also receive vaccine at local pharmacy or Health Dept. Verbalized acceptance and understanding.  Screening Tests Health Maintenance  Topic Date Due   Zoster Vaccines- Shingrix (1 of 2) Never done   COVID-19 Vaccine (3 - Janssen risk series) 11/25/2020   MAMMOGRAM  12/13/2022   TETANUS/TDAP  12/03/2026   COLONOSCOPY (Pts 45-25yr Insurance coverage will need to be confirmed)  12/26/2031   Pneumonia Vaccine 71 Years old  Completed   INFLUENZA VACCINE  Completed   DEXA SCAN  Completed   Hepatitis C Screening  Completed   HPV VACCINES  Aged Out    Health Maintenance  Health Maintenance Due  Topic Date Due   Zoster Vaccines- Shingrix (1 of 2) Never done   COVID-19 Vaccine (3 - Janssen risk series) 11/25/2020    Colorectal cancer screening: Type of screening: Colonoscopy. Completed 12/25/21. Repeat every 10 years  Mammogram status: Completed 12/13/21. Repeat every year  Bone Density status: Completed 03/06/15. Results  reflect: Bone density results: NORMAL. Repeat every 2 years.  Lung Cancer Screening: (Low Dose CT Chest recommended if Age 72-80years, 30 pack-year currently smoking OR have quit w/in 15years.) does not qualify.   Lung Cancer Screening Referral: N/a  Additional Screening:  Hepatitis C Screening: does qualify; Completed 06/24/17  Vision Screening: Recommended annual ophthalmology exams for early detection of glaucoma and other disorders of the eye. Is the patient up to date with their annual eye exam?  Yes  Who is the provider or what is the name of the office in which the patient attends annual eye exams? Dr. TJoya SanIf pt is not established with a provider, would they like to be referred to a provider to establish care? No .   Dental Screening: Recommended annual dental exams for proper oral hygiene  Community Resource Referral / Chronic Care Management: CRR required this visit?  No   CCM required this visit?  No      Plan:     I have personally reviewed and noted the following in the patient's chart:   Medical and social history Use of alcohol, tobacco or illicit drugs  Current medications and supplements including  opioid prescriptions. Patient is not currently taking opioid prescriptions. Functional ability and status Nutritional status Physical activity Advanced directives List of other physicians Hospitalizations, surgeries, and ER visits in previous 12 months Vitals Screenings to include cognitive, depression, and falls Referrals and appointments  In addition, I have reviewed and discussed with patient certain preventive protocols, quality metrics, and best practice recommendations. A written personalized care plan for preventive services as well as general preventive health recommendations were provided to patient.     Beatris Ship, [place]   07/31/2022   Nurse Notes: None  I have reviewed and agree with [facility] documentation.  Kathlene November, MD

## 2022-07-31 NOTE — Patient Instructions (Addendum)
Vaccines I recommend: Shingrix (shingles) Covid booster RSV vaccine   Check the  blood pressure regularly BP GOAL is between 110/65 and  135/85. If it is consistently higher or lower, let me know      GO TO THE LAB : Get the blood work     [place], [place] back for   a checkup in 6 months      Advanced care planning:  Do you have a "Living will", "[place] of attorney" ?   If you already have a living will or healthcare power of attorney, is recommended you bring the copy to be scanned in your chart. The document will be available to all the doctors you see in the system.  If you don't have one, please consider create one.  Advance care planning is a process that supports adults in  understanding and sharing their preferences regarding future medical care.   The patient's preferences are recorded in documents called Advance Directives.    Advanced directives are completed (and can be modified at any time) while the patient is in full mental capacity.   The documentation should be available at all times to the patient, the family and the healthcare providers.   This legal documents direct treatment decision making and/or appoint a surrogate to make the decision if the patient is not capable to do so.    Advance directives can be documented in many types of formats,  documents have names such as:  Lliving will  Durable power of attorney for healthcare (healthcare proxy or healthcare power of attorney)  Combined directives  Physician orders for life-sustaining treatment    More information at:  [email or web address]

## 2022-07-31 NOTE — Patient Instructions (Signed)
Brittney Walker , Thank you for taking time to come for your Medicare Wellness Visit. I appreciate your ongoing commitment to your health goals. Please review the following plan we discussed and let me know if I can assist you in the future.   These are the goals we discussed:  Goals   None     This is a list of the screening recommended for you and due dates:  Health Maintenance  Topic Date Due   Zoster (Shingles) Vaccine (1 of 2) Never done   COVID-19 Vaccine (3 - Janssen risk series) 11/25/2020   Mammogram  12/13/2022   Tetanus Vaccine  12/03/2026   Colon Cancer Screening  12/26/2031   Pneumonia Vaccine  Completed   Flu Shot  Completed   DEXA scan (bone density measurement)  Completed   Hepatitis C Screening: USPSTF Recommendation to screen - Ages 39-79 yo.  Completed   HPV Vaccine  Aged Out     Next appointment: Follow up in one year for your annual wellness visit August 05, 2023 11:00am   Preventive Care 65 Years and Older, Female Preventive care refers to lifestyle choices and visits with your health care provider that can promote health and wellness. What does preventive care include? A yearly physical exam. This is also called an annual well check. Dental exams once or twice a year. Routine eye exams. Ask your health care provider how often you should have your eyes checked. Personal lifestyle choices, including: Daily care of your teeth and gums. Regular physical activity. Eating a healthy diet. Avoiding tobacco and drug use. Limiting alcohol use. Practicing safe sex. Taking low-dose aspirin every day. Taking vitamin and mineral supplements as recommended by your health care provider. What happens during an annual well check? The services and screenings done by your health care provider during your annual well check will depend on your age, overall health, lifestyle risk factors, and family history of disease. Counseling  Your health care provider may ask you  questions about your: Alcohol use. Tobacco use. Drug use. Emotional well-being. Home and relationship well-being. Sexual activity. Eating habits. History of falls. Memory and ability to understand (cognition). Work and work Statistician. Reproductive health. Screening  You may have the following tests or measurements: Height, weight, and BMI. Blood pressure. Lipid and cholesterol levels. These may be checked every 5 years, or more frequently if you are over 60 years old. Skin check. Lung cancer screening. You may have this screening every year starting at age 3 if you have a 30-pack-year history of smoking and currently smoke or have quit within the past 15 years. Fecal occult blood test (FOBT) of the stool. You may have this test every year starting at age 75. Flexible sigmoidoscopy or colonoscopy. You may have a sigmoidoscopy every 5 years or a colonoscopy every 10 years starting at age 107. Hepatitis C blood test. Hepatitis B blood test. Sexually transmitted disease (STD) testing. Diabetes screening. This is done by checking your blood sugar (glucose) after you have not eaten for a while (fasting). You may have this done every 1-3 years. Bone density scan. This is done to screen for osteoporosis. You may have this done starting at age 36. Mammogram. This may be done every 1-2 years. Talk to your health care provider about how often you should have regular mammograms. Talk with your health care provider about your test results, treatment options, and if necessary, the need for more tests. Vaccines  Your health care provider may recommend certain  vaccines, such as: Influenza vaccine. This is recommended every year. Tetanus, diphtheria, and acellular pertussis (Tdap, Td) vaccine. You may need a Td booster every 10 years. Zoster vaccine. You may need this after age 64. Pneumococcal 13-valent conjugate (PCV13) vaccine. One dose is recommended after age 34. Pneumococcal polysaccharide  (PPSV23) vaccine. One dose is recommended after age 65. Talk to your health care provider about which screenings and vaccines you need and how often you need them. This information is not intended to replace advice given to you by your health care provider. Make sure you discuss any questions you have with your health care provider. Document Released: 11/17/2015 Document Revised: 07/10/2016 Document Reviewed: 08/22/2015 Elsevier Interactive Patient Education  2017 [place] Prevention in the Home Falls can cause injuries. They can happen to people of all ages. There are many things you can do to make your home safe and to help prevent falls. What can I do on the outside of my home? Regularly fix the edges of walkways and driveways and fix any cracks. Remove anything that might make you trip as you walk through a door, such as a raised step or threshold. Trim any bushes or trees on the path to your home. Use bright outdoor lighting. Clear any walking paths of anything that might make someone trip, such as rocks or tools. Regularly check to see if handrails are loose or broken. Make sure that both sides of any steps have handrails. Any raised decks and porches should have guardrails on the edges. Have any leaves, snow, or ice cleared regularly. Use sand or salt on walking paths during winter. Clean up any spills in your garage right away. This includes oil or grease spills. What can I do in the bathroom? Use night lights. Install grab bars by the toilet and in the tub and shower. Do not use towel bars as grab bars. Use non-skid mats or decals in the tub or shower. If you need to sit down in the shower, use a plastic, non-slip stool. Keep the floor dry. Clean up any water that spills on the floor as soon as it happens. Remove soap buildup in the tub or shower regularly. Attach bath mats securely with double-sided non-slip rug tape. Do not have throw rugs and other things on the  floor that can make you trip. What can I do in the bedroom? Use night lights. Make sure that you have a light by your bed that is easy to reach. Do not use any sheets or blankets that are too big for your bed. They should not hang down onto the floor. Have a firm chair that has side arms. You can use this for support while you get dressed. Do not have throw rugs and other things on the floor that can make you trip. What can I do in the kitchen? Clean up any spills right away. Avoid walking on wet floors. Keep items that you use a lot in easy-to-reach places. If you need to reach something above you, use a strong step stool that has a grab bar. Keep electrical cords out of the way. Do not use floor polish or wax that makes floors slippery. If you must use wax, use non-skid floor wax. Do not have throw rugs and other things on the floor that can make you trip. What can I do with my stairs? Do not leave any items on the stairs. Make sure that there are handrails on both sides of the stairs  and use them. Fix handrails that are broken or loose. Make sure that handrails are as long as the stairways. Check any carpeting to make sure that it is firmly attached to the stairs. Fix any carpet that is loose or worn. Avoid having throw rugs at the top or bottom of the stairs. If you do have throw rugs, attach them to the floor with carpet tape. Make sure that you have a light switch at the top of the stairs and the bottom of the stairs. If you do not have them, ask someone to add them for you. What else can I do to help prevent falls? Wear shoes that: Do not have high heels. Have rubber bottoms. Are comfortable and fit you well. Are closed at the toe. Do not wear sandals. If you use a stepladder: Make sure that it is fully opened. Do not climb a closed stepladder. Make sure that both sides of the stepladder are locked into place. Ask someone to hold it for you, if possible. Clearly mark and make  sure that you can see: Any grab bars or handrails. First and last steps. Where the edge of each step is. Use tools that help you move around (mobility aids) if they are needed. These include: Canes. Walkers. Scooters. Crutches. Turn on the lights when you go into a dark area. Replace any light bulbs as soon as they burn out. Set up your furniture so you have a clear path. Avoid moving your furniture around. If any of your floors are uneven, fix them. If there are any pets around you, be aware of where they are. Review your medicines with your doctor. Some medicines can make you feel dizzy. This can increase your chance of falling. Ask your doctor what other things that you can do to help prevent falls. This information is not intended to replace advice given to you by your health care provider. Make sure you discuss any questions you have with your health care provider. Document Released: 08/17/2009 Document Revised: 03/28/2016 Document Reviewed: 11/25/2014 Elsevier Interactive Patient Education  2017 [employer].

## 2022-07-31 NOTE — Progress Notes (Unsigned)
Subjective:    Patient ID: Brittney Walker, female    DOB: [birth year], 71 y.o.   MRN: [id number]  DOS:  07/31/2022 Type of visit - description:  CPX  Routine follow-up. No major concerns.  Wt Readings from Last 3 Encounters:  07/31/22 228 lb (103.4 kg)  07/31/22 228 lb (103.4 kg)  04/26/22 218 lb 4 oz (99 kg)    Review of Systems   A 14 point review of systems is negative    Past Medical History:  Diagnosis Date   Atrial fibrillation ([place]) 10/04/2021   Carotid bruit    w/ neg dopplers    CHF (congestive heart failure) ([place])    History of gastric ulcer 10/04/2021   Hyperthyroidism     s/p RAI 2004   Normocytic anemia    Persistent atrial fibrillation ([place])    a. Dx 07/2021; b. CHA2DS2VASc = 3-->Eliquis; c. 10/2021 s/p DCCV.   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   Vitamin D deficiency     Past Surgical History:  Procedure Laterality Date   ABDOMINAL HYSTERECTOMY  11/05/1995   TAH-LEIOMYOMATA-- no BSO   BIOPSY  12/25/2021   Procedure: BIOPSY;  Surgeon: Doran Stabler, MD;  Location: [facility];  Service: Gastroenterology;;   BIOPSY THYROID      bx of largest nodule 2005, benign   BREAST SURGERY     x's 2--fibrocystic dz   CARDIOVERSION N/A 10/10/2021   Procedure: CARDIOVERSION;  Surgeon: Sanda Klein, MD;  Location: [facility];  Service: Cardiovascular;  Laterality: N/A;   CATARACT EXTRACTION, BILATERAL Bilateral    COLONOSCOPY WITH PROPOFOL N/A 12/25/2021   Procedure: COLONOSCOPY WITH PROPOFOL;  Surgeon: Doran Stabler, MD;  Location: [facility];  Service: Gastroenterology;  Laterality: N/A;   ESOPHAGOGASTRODUODENOSCOPY (EGD) WITH PROPOFOL N/A 10/12/2021   Procedure: ESOPHAGOGASTRODUODENOSCOPY (EGD) WITH PROPOFOL;  Surgeon: Doran Stabler, MD;  Location: [facility];  Service: Gastroenterology;  Laterality: N/A;   ESOPHAGOGASTRODUODENOSCOPY (EGD) WITH PROPOFOL N/A 12/25/2021   Procedure: ESOPHAGOGASTRODUODENOSCOPY (EGD)  WITH PROPOFOL;  Surgeon: Doran Stabler, MD;  Location: [facility];  Service: Gastroenterology;  Laterality: N/A;   POLYPECTOMY  12/25/2021   Procedure: POLYPECTOMY;  Surgeon: Doran Stabler, MD;  Location: [facility];  Service: Gastroenterology;;   TONSILLECTOMY     Social History   Socioeconomic History   Marital status: Single    Spouse name: Not on file   Number of children: 0   Years of education: Not on file   Highest education level: Not on file  Occupational History   Occupation: retired---collections  Tobacco Use   Smoking status: Never   Smokeless tobacco: Never  Vaping Use   Vaping Use: Never used  Substance and Sexual Activity   Alcohol use: No    Alcohol/week: 0.0 standard drinks of alcohol   Drug use: No   Sexual activity: Never    Comment: [place]  Other Topics Concern   Not on file  Social History Narrative   Lives by herself    Social Determinants of Health   Financial Resource Strain: Medium Risk (07/31/2022)   Overall Financial Resource Strain (CARDIA)    Difficulty of Paying Living Expenses: Somewhat hard  Food Insecurity: No Food Insecurity (07/31/2022)   Hunger Vital Sign    Worried About Running Out of Food in the Last Year: Never true    Ran Out of Food in the Last Year: Never true  Transportation Needs: No Transportation Needs (07/31/2022)  PRAPARE - Hydrologist (Medical): No    Lack of Transportation (Non-Medical): No  Physical Activity: Inactive (07/31/2022)   Exercise Vital Sign    Days of Exercise per Week: 0 days    Minutes of Exercise per Session: 0 min  Stress: No Stress Concern Present (07/31/2022)   [place]    Feeling of Stress : Not at all  Social Connections: Moderately Isolated (07/31/2022)   Social Connection and Isolation Panel [NHANES]    Frequency of Communication with Friends and Family: Once a week    Frequency of  Social Gatherings with Friends and Family: Once a week    Attends Religious Services: More than 4 times per year    Active Member of [employer] or Organizations: Yes    Attends Archivist Meetings: More than 4 times per year    Marital Status: Never married  Intimate Partner Violence: Not At Risk (07/31/2022)   Humiliation, Afraid, Rape, and Kick questionnaire    Fear of Current or Ex-Partner: No    Emotionally Abused: No    Physically Abused: No    Sexually Abused: No    Current Outpatient Medications  Medication Instructions   apixaban (ELIQUIS) 5 mg, Oral, 2 times daily   calcium carbonate (OS-CAL - DOSED IN MG OF ELEMENTAL CALCIUM) 1,250 mg, Oral, Daily with breakfast   cholecalciferol (VITAMIN D) 1,000 Units, Daily   cyanocobalamin (VITAMIN B12) 1,000 mcg, Oral, Daily   ferrous sulfate 325 mg, Oral, 2 times daily with meals   furosemide (LASIX) 20 MG tablet As needed patient may take 60 MG Lasix PRN by mouth QD x 4 days as directed per [employer] HF Study PRN plan.   Multiple Vitamin (MULTIVITAMIN WITH MINERALS) TABS tablet 1 tablet, Oral, Every morning, Alive   omeprazole (PRILOSEC) 40 mg, Oral, Daily   potassium chloride (KLOR-CON) 10 MEQ tablet Patient may take additional 30 mEq (1 tablet) potassium BY MOUTH DAILY AS DIRECTED for Allev[employer] HF Research Study<BR>    vitamin A 10,000 Units, Daily   vitamin C 1,000 mg, Daily   vitamin E 400 Units, Daily   zinc gluconate 50 mg, Oral, Daily       Objective:   Physical Exam BP 126/68   Pulse (!) 44   Temp 98 F (36.7 C) (Oral)   Resp 18   Ht 5' 5.75" (1.67 m)   Wt 228 lb (103.4 kg)   LMP 11/05/1995   SpO2 98%   BMI 37.08 kg/m  General: Well developed, NAD, BMI noted Neck: No  thyromegaly  HEENT:  Normocephalic . Face symmetric, atraumatic Lungs:  CTA B Normal respiratory effort, no intercostal retractions, no accessory muscle use. Heart: bradycardic, no murmur.  Abdomen:  Not distended, soft,  non-tender. No rebound or rigidity.   Lower extremities: no pretibial edema bilaterally  Skin: Exposed areas without rash. Not pale. Not jaundice Neurologic:  alert & oriented X3.  Speech normal, gait appropriate for age and unassisted Strength symmetric and appropriate for age.  Psych: Cognition and judgment appear intact.  Cooperative with normal attention span and concentration.  Behavior appropriate. No anxious or depressed appearing.     Assessment     Assessment Gout Thyroid disease,  -RAI 2004 - Bx 2005 neg -[place] 10-2014-- new nodule, Bx unsuccessful 12-2014 Morbid obesity  H/o  VIN I,  2000 Vitamin D deficiency Carotid bruit w/ negative Dopplers A- Fibrillation,Dx 07-2021, cardioverted 10/11/2021 CHF  Dx 07-2021 Acute anemia due to H. pylori + gastric ulcer 10/2021   PLAN Here for CPX Gout: No symptoms, check uric acid. Thyroid disease: Check TSH.  No thyromegaly on today's exam Vitamin D deficiency: On supplements, checking levels. A-fib, CHF: Currently on Eliquis, Lasix, potassium.  Checking a CMP.  + Weight noted but no edema on exam, no DOE.  Suspect weight gain is due to diet.  She agrees, plans to improve. She is bradycardic, denies dizziness or orthostatic symptoms, plan to discuss with cardiology at her next appointment in few weeks Anemia, + H. pylori: Saw GI 04/2022, issue essentially resolved, they recommend prophylactic PPIs, dietary advice to prevent GERD was provided.  Check CBC. RTC 6 months

## 2022-08-01 ENCOUNTER — Encounter: Payer: Self-pay | Admitting: Internal Medicine

## 2022-08-01 NOTE — Assessment & Plan Note (Signed)
Here for CPX Gout: No symptoms, check uric acid. Thyroid disease: Check TSH.  No thyromegaly on today's exam Vitamin D deficiency: On supplements, checking levels. A-fib, CHF: Currently on Eliquis, Lasix, potassium.  Checking a CMP.  + Weight noted but no edema on exam, no DOE.  Suspect weight gain is due to diet.  She agrees, plans to improve. She is bradycardic, denies dizziness or orthostatic symptoms, plan to discuss with cardiology at her next appointment in few weeks Anemia, + H. pylori: Saw GI 04/2022, issue essentially resolved, they recommend prophylactic PPIs, dietary advice to prevent GERD was provided.  Check CBC. RTC 6 months

## 2022-08-01 NOTE — Assessment & Plan Note (Signed)
-  Td 11-2016 - pnm 23: 2016; prevnar 11-2016; PNM 20: 2023 -Vaccines I recommend: COVID-vaccine (plans to proceed ), flu shot, (done today) Shingrix (had one plans to get #2), RSV. -CCS: Had a colonoscopy 10-2014, negative, next 10 years --Female care: per [facility] PAP 12-07-2018, [facility] 12-13-21 -DEXA several before, last  2016 @ gyn: wnl; on Vit D, no personal h/o fractures -Diet and exercise  -Labs:  Uric acid, CMP, CBC, TSH, vit d - H-POA info provided

## 2022-08-06 NOTE — Progress Notes (Signed)
Cardiology Office Note:    Date:  08/11/2022   ID:  Brittney Walker, DOB 01/29/1951, MRN [id number]  PCP:  Colon Branch, MD   [facility] Providers Cardiologist:  Sanda Klein, MD     Referring MD: Colon Branch, MD   Chief Complaint  Patient presents with   Atrial Fibrillation    History of Present Illness:    Brittney Walker is a 71 y.o. female with a hx of persistent atrial fibrillation for which she underwent electrical cardioversion on 10/11/2021.  Following the cardioversion she was noted to be severely anemic and underwent upper endoscopy that showed a large gastric ulcer, H. pylori positive.  Her echocardiogram showed dilated right heart chambers without evidence of left to right shunt, with mild-moderate reduced right ventricular systolic function, severely dilated right atrium and severe tricuspid regurgitation.  Labs were consistent with iron deficiency anemia.  She feels great.  She has no cardiovascular complaints.  She remains unaware of the arrhythmia.  Today she is in atrial fibrillation with slow ventricular sounds at 45 bpm, but is unaware of palpitations.  She denies dizziness, weakness or presyncope.  She saw Dr. Larose Kells for routine visit in September and had no complaints.  She was bradycardic at 44 bpm (ECG was not performed), but did not have dizziness or symptoms of orthostatic hypotension.  She did not have any edema and weighed 228 pounds.  She saw Dr. Loletha Carrow in the GI clinic a few months earlier and received advice for treating GERD.  Is felt that her problems with the gastric ulcer had resolved.  Her most recent hemoglobin was normal at 12.8 last week.  In June she also had lower extremity arterial Dopplers (ordered by Dr. Ila Mcgill, her podiatrist, for pain and numbness in her legs) with no evidence of arterial obstruction.  She was enrolled in the alleviate HF trial and has an implantable loop recorder that can also be used to monitor her rhythm abnormalities.   The loop recorder shows that over the last month she has had a burden of atrial fibrillation of about 20%.  The longest recorded pause is 3 seconds or less.  Interestingly, the device shows increased activity level over the last 5 to 6 weeks and coincident increasing atrial fibrillation with slow ventricular response.  Past Medical History:  Diagnosis Date   Atrial fibrillation ([place]) 10/04/2021   Carotid bruit    w/ neg dopplers    CHF (congestive heart failure) ([place])    History of gastric ulcer 10/04/2021   Hyperthyroidism     s/p RAI 2004   Normocytic anemia    Persistent atrial fibrillation ([place])    a. Dx 07/2021; b. CHA2DS2VASc = 3-->Eliquis; c. 10/2021 s/p DCCV.   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   Vitamin D deficiency     Past Surgical History:  Procedure Laterality Date   ABDOMINAL HYSTERECTOMY  11/05/1995   TAH-LEIOMYOMATA-- no BSO   BIOPSY  12/25/2021   Procedure: BIOPSY;  Surgeon: Doran Stabler, MD;  Location: [facility];  Service: Gastroenterology;;   BIOPSY THYROID      bx of largest nodule 2005, benign   BREAST SURGERY     x's 2--fibrocystic dz   CARDIOVERSION N/A 10/10/2021   Procedure: CARDIOVERSION;  Surgeon: Sanda Klein, MD;  Location: [facility];  Service: Cardiovascular;  Laterality: N/A;   CATARACT EXTRACTION, BILATERAL Bilateral    COLONOSCOPY WITH PROPOFOL N/A 12/25/2021   Procedure: COLONOSCOPY WITH PROPOFOL;  Surgeon: Doran Stabler, MD;  Location: [facility];  Service: Gastroenterology;  Laterality: N/A;   ESOPHAGOGASTRODUODENOSCOPY (EGD) WITH PROPOFOL N/A 10/12/2021   Procedure: ESOPHAGOGASTRODUODENOSCOPY (EGD) WITH PROPOFOL;  Surgeon: Doran Stabler, MD;  Location: [facility];  Service: Gastroenterology;  Laterality: N/A;   ESOPHAGOGASTRODUODENOSCOPY (EGD) WITH PROPOFOL N/A 12/25/2021   Procedure: ESOPHAGOGASTRODUODENOSCOPY (EGD) WITH PROPOFOL;  Surgeon: Doran Stabler, MD;  Location: [facility];   Service: Gastroenterology;  Laterality: N/A;   POLYPECTOMY  12/25/2021   Procedure: POLYPECTOMY;  Surgeon: Doran Stabler, MD;  Location: [facility];  Service: Gastroenterology;;   TONSILLECTOMY      Current Medications: Current Meds  Medication Sig   apixaban (ELIQUIS) 5 MG TABS tablet Take 1 tablet (5 mg total) by mouth 2 (two) times daily.   Ascorbic Acid (VITAMIN C) 1000 MG tablet Take 1,000 mg by mouth daily.   calcium carbonate (OS-CAL - DOSED IN MG OF ELEMENTAL CALCIUM) 1250 MG tablet Take 1,250 mg by mouth daily with breakfast.   cholecalciferol (VITAMIN D) 1000 UNITS tablet Take 1,000 Units by mouth daily.   furosemide (LASIX) 20 MG tablet As needed patient may take 60 MG Lasix PRN by mouth QD x 4 days as directed per Alleviate Research HF Study PRN plan.   Multiple Vitamin (MULTIVITAMIN WITH MINERALS) TABS tablet Take 1 tablet by mouth in the morning. Alive   omeprazole (PRILOSEC) 40 MG capsule TAKE 1 CAPSULE (40 MG TOTAL) BY MOUTH DAILY.   potassium chloride (KLOR-CON) 10 MEQ tablet Patient may take additional 30 mEq (1 tablet) potassium BY MOUTH DAILY AS DIRECTED for Alleviate HF Research Study     vitamin A 10000 UNIT capsule Take 10,000 Units by mouth daily.   vitamin B-12 (CYANOCOBALAMIN) 1000 MCG tablet Take 1,000 mcg by mouth daily.   vitamin E 400 UNIT capsule Take 400 Units by mouth daily.   zinc gluconate 50 MG tablet Take 50 mg by mouth daily.     Allergies:   Patient has no known allergies.   Social History   Socioeconomic History   Marital status: Single    Spouse name: Not on file   Number of children: 0   Years of education: Not on file   Highest education level: Not on file  Occupational History   Occupation: retired---collections  Tobacco Use   Smoking status: Never   Smokeless tobacco: Never  Vaping Use   Vaping Use: Never used  Substance and Sexual Activity   Alcohol use: No    Alcohol/week: 0.0 standard drinks of alcohol   Drug use: No    Sexual activity: Never    Comment: [place]  Other Topics Concern   Not on file  Social History Narrative   Lives by herself    Social Determinants of Health   Financial Resource Strain: Medium Risk (07/31/2022)   Overall Financial Resource Strain (CARDIA)    Difficulty of Paying Living Expenses: Somewhat hard  Food Insecurity: No Food Insecurity (07/31/2022)   Hunger Vital Sign    Worried About Running Out of Food in the Last Year: Never true    [place] in the Last Year: Never true  Transportation Needs: No Transportation Needs (07/31/2022)   PRAPARE - Hydrologist (Medical): No    Lack of Transportation (Non-Medical): No  Physical Activity: Inactive (07/31/2022)   Exercise Vital Sign    Days of Exercise per Week: 0 days    Minutes of  Exercise per Session: 0 min  Stress: No Stress Concern Present (07/31/2022)   [place]    Feeling of Stress : Not at all  Social Connections: Moderately Isolated (07/31/2022)   Social Connection and Isolation Panel [NHANES]    Frequency of Communication with Friends and Family: Once a week    Frequency of Social Gatherings with Friends and Family: Once a week    Attends Religious Services: More than 4 times per year    Active Member of [employer] or Organizations: Yes    Attends Music therapist: More than 4 times per year    Marital Status: Never married     Family History: The patient's family history includes Breast cancer (age of onset: 38) in her sister; Heart attack in her father; Heart disease in her mother; Hypertension in her father and mother; Seizures in her father. There is no history of Colon cancer, Pancreatic cancer, Esophageal cancer, Prostate cancer, or Rectal cancer.  ROS:   Please see the history of present illness.     All other systems reviewed and are negative.  EKGs/Labs/Other Studies Reviewed:    The following  studies were reviewed today: Echocardiogram 11/08/2021   1. Left ventricular ejection fraction, by estimation, is 60 to 65%. The  left ventricle has normal function. The left ventricle has no regional  wall motion abnormalities. There is mild left ventricular hypertrophy of  the septal segment. Left ventricular  diastolic parameters are consistent with Grade I diastolic dysfunction  (impaired relaxation). There is the interventricular septum is flattened  in systole and diastole, consistent with right ventricular pressure and  volume overload.   2. Right ventricular systolic function is mildly reduced. The right  ventricular size is moderately enlarged. There is moderately elevated  pulmonary artery systolic pressure. The estimated right ventricular  systolic pressure is 78.9 mmHg.   3. Right atrial size was moderately dilated.   4. The mitral valve is normal in structure. Mild mitral valve  regurgitation. No evidence of mitral stenosis. There is mild holosystolic  prolapse of the middle segment of the anterior leaflet of the mitral  valve.   5. Tricuspid valve regurgitation is moderate to severe.   6. The aortic valve is normal in structure. Aortic valve regurgitation is  not visualized. No aortic stenosis is present.   7. The inferior vena cava is dilated in size with <50% respiratory  variability, suggesting right atrial pressure of 15 mmHg.   Comparison(s): Prior images reviewed side by side.    EKG:  EKG is ordered  today.  Atrial fibrillation with slow ventricular sponsor 45 bpm, possibly a junctional escape rhythm competing with the A-fib.  There is poor R wave progression with growth of the R wave only MVP 6.  The family just QT interval is 472 ms.   Recent Labs: 10/16/2021: Magnesium 2.2 04/24/2022: BNP CANCELED 07/31/2022: ALT 20; BUN 18; Creatinine, Ser 0.87; Hemoglobin 12.8; Platelets 126.0; Potassium 4.3; Sodium 138; TSH 3.06  Recent Lipid Panel    Component Value  Date/Time   CHOL 180 12/21/2021 1105   TRIG 60.0 12/21/2021 1105   HDL 63.40 12/21/2021 1105   CHOLHDL 3 12/21/2021 1105   VLDL 12.0 12/21/2021 1105   LDLCALC 105 (H) 12/21/2021 1105   LDLDIRECT 141.1 05/01/2009 0947     Risk Assessment/Calculations:    CHA2DS2-VASc Score = 4   This indicates a 4.8% annual risk of stroke. The patient's score is based  upon: CHF History: 1 HTN History: 0 Diabetes History: 0 Stroke History: 0 Vascular Disease History: 1 (aortic atherosclerosis) Age Score: 1 Gender Score: 1          Physical Exam:    VS:  BP 122/68 (BP Location: Left Arm, Patient Position: Sitting, Cuff Size: Large)   Pulse (!) 45   Ht '5\' 6"'$  (1.676 m)   Wt 224 lb 12.8 oz (102 kg)   LMP 11/05/1995   SpO2 94%   BMI 36.28 kg/m     Wt Readings from Last 3 Encounters:  08/07/22 224 lb 12.8 oz (102 kg)  07/31/22 228 lb (103.4 kg)  07/31/22 228 lb (103.4 kg)      General: Alert, oriented x3, no distress, obese Head: no evidence of trauma, PERRL, EOMI, no exophtalmos or lid lag, no myxedema, no xanthelasma; normal ears, nose and oropharynx Neck: normal jugular venous pulsations and no hepatojugular reflux; brisk carotid pulses without delay and no carotid bruits Chest: clear to auscultation, no signs of consolidation by percussion or palpation, normal fremitus, symmetrical and full respiratory excursions Cardiovascular: normal position and quality of the apical impulse, irregular rhythm, normal first and second heart sounds, no murmurs, rubs or gallops Abdomen: no tenderness or distention, no masses by palpation, no abnormal pulsatility or arterial bruits, normal bowel sounds, no hepatosplenomegaly Extremities: no clubbing, cyanosis or edema; 2+ radial, ulnar and brachial pulses bilaterally; 2+ right femoral, posterior tibial and dorsalis pedis pulses; 2+ left femoral, posterior tibial and dorsalis pedis pulses; no subclavian or femoral bruits Neurological: grossly  nonfocal Psych: Normal mood and affect   ASSESSMENT:    1. Persistent atrial fibrillation ([place])   2. Chronic right-sided heart failure (HCC)   3. Status post placement of implantable loop recorder   4. History of anemia   5. History of gastric ulcer   6. Severe obesity (BMI 35.0-39.9) with comorbidity ([place])     PLAN:    In order of problems listed above:  Persistent atrial fibrillation: Her loop recorder shows that she has been in and out of atrial fibrillation over the last month with an overall problems of about 20%.  She otherwise successfully maintain normal rhythm for about 10 months following her previous cardioversion.  She is in A-fib right now with a ventricular rate of only 45 bpm but is asymptomatic.  Appropriately anticoagulated with Eliquis without interruption.  Successfully maintaining sinus rhythm roughly 2 weeks following elective cardioversion.    Underlying cause for atrial fibrillation appears to be right heart dilation.  No history of thromboembolic disease.  Although she does not have prominent daytime hypersomnolence, untreated obstructive sleep apnea appears to be the most likely cause.  Currently not on antiarrhythmics and I am concerned that if we will start any we will precipitate the need for pacemaker. Chronic right heart failure: Pulmonary artery pressure was abnormal, but was not estimated to be severely elevated .  It is possible that she has tricuspid regurgitation and has a primary valvular abnormality, causing right heart enlargement.  Continue diuretics.  Recheck an echocardiogram after several months in normal sinus rhythm.  We have been trying to schedule a sleep study but we are waiting for her to have full insurance coverage. Loop recorder:  Enrolled in ALLEVIATE-HF. Iron deficiency anemia: Resolved.  On continued anticoagulation. Gastric ulcer, H. pylori positive: Expected to be healed at this point. Mild obesity: Increases likelihood of arrhythmia  recurrence and possibility that she has obstructive sleep apnea.  We will have her follow-up in the A-fib clinic next week.  If she remains in persistent atrial fibrillation, we will schedule for cardioversion.  Would also benefit from evaluation and discussion of ablation.  Antiarrhythmics may not be tolerated well (at least not without a pacemaker) due to her tendency to have bradycardia.      Medication Adjustments/Labs and Tests Ordered: Current medicines are reviewed at length with the patient today.  Concerns regarding medicines are outlined above.  Orders Placed This Encounter  Procedures   EKG 12-Lead   No orders of the defined types were placed in this encounter.   Patient Instructions  Medication Instructions:  No changes *If you need a refill on your cardiac medications before your next appointment, please call your pharmacy*   Lab Work: None ordered If you have labs (blood work) drawn today and your tests are completely normal, you will receive your results only by: [place] (if you have MyChart) OR A paper copy in the mail If you have any lab test that is abnormal or we need to change your treatment, we will call you to review the results.   Testing/Procedures: None ordered   Follow-Up: At [facility], you and your health needs are our priority.  As part of our continuing mission to provide you with exceptional heart care, we have created designated Provider Care Teams.  These Care Teams include your primary Cardiologist (physician) and Advanced Practice Providers (APPs -  Physician Assistants and Nurse Practitioners) who all work together to provide you with the care you need, when you need it.  We recommend signing up for the patient portal called "MyChart".  Sign up information is provided on this After Visit Summary.  MyChart is used to connect with patients for Virtual Visits (Telemedicine).  Patients are able to view lab/test results,  encounter notes, upcoming appointments, etc.  Non-urgent messages can be sent to your provider as well.   To learn more about what you can do with MyChart, go to [email or web address].    Your next appointment:   Follow up in one week with the afib clinic Follow up in 3 months with Dr. Sallyanne Kuster   Signed, Sanda Klein, MD  08/11/2022 7:54 PM    [place]

## 2022-08-07 ENCOUNTER — Ambulatory Visit: Payer: 59 | Attending: Cardiovascular Disease | Admitting: Cardiovascular Disease

## 2022-08-07 ENCOUNTER — Encounter: Payer: Self-pay | Admitting: Cardiovascular Disease

## 2022-08-07 VITALS — BP 122/68 | HR 45 | Ht 66.0 in | Wt 224.8 lb

## 2022-08-07 DIAGNOSIS — Z862 Personal history of diseases of the blood and blood-forming organs and certain disorders involving the immune mechanism: Secondary | ICD-10-CM | POA: Diagnosis not present

## 2022-08-07 DIAGNOSIS — I50812 Chronic right heart failure: Secondary | ICD-10-CM | POA: Diagnosis not present

## 2022-08-07 DIAGNOSIS — Z8711 Personal history of peptic ulcer disease: Secondary | ICD-10-CM

## 2022-08-07 DIAGNOSIS — Z95818 Presence of other cardiac implants and grafts: Secondary | ICD-10-CM | POA: Diagnosis not present

## 2022-08-07 DIAGNOSIS — I4819 Other persistent atrial fibrillation: Secondary | ICD-10-CM

## 2022-08-07 NOTE — Patient Instructions (Signed)
Medication Instructions:  No changes *If you need a refill on your cardiac medications before your next appointment, please call your pharmacy*   Lab Work: None ordered If you have labs (blood work) drawn today and your tests are completely normal, you will receive your results only by: [place] (if you have MyChart) OR A paper copy in the mail If you have any lab test that is abnormal or we need to change your treatment, we will call you to review the results.   Testing/Procedures: None ordered   Follow-Up: At [facility], you and your health needs are our priority.  As part of our continuing mission to provide you with exceptional heart care, we have created designated Provider Care Teams.  These Care Teams include your primary Cardiologist (physician) and Advanced Practice Providers (APPs -  Physician Assistants and Nurse Practitioners) who all work together to provide you with the care you need, when you need it.  We recommend signing up for the patient portal called "MyChart".  Sign up information is provided on this After Visit Summary.  MyChart is used to connect with patients for Virtual Visits (Telemedicine).  Patients are able to view lab/test results, encounter notes, upcoming appointments, etc.  Non-urgent messages can be sent to your provider as well.   To learn more about what you can do with MyChart, go to [email or web address].    Your next appointment:   Follow up in one week with the afib clinic Follow up in 3 months with Dr. Sallyanne Kuster

## 2022-08-11 ENCOUNTER — Encounter: Payer: Self-pay | Admitting: Cardiovascular Disease

## 2022-08-15 ENCOUNTER — Ambulatory Visit (HOSPITAL_COMMUNITY)
Admission: RE | Admit: 2022-08-15 | Discharge: 2022-08-15 | Disposition: A | Discharge status: Home / Self Care | Payer: 59 | Source: Ambulatory Visit | Attending: Physician Assistant | Admitting: Physician Assistant

## 2022-08-15 ENCOUNTER — Encounter (HOSPITAL_COMMUNITY): Payer: Self-pay | Admitting: Physician Assistant

## 2022-08-15 VITALS — BP 114/80 | HR 41 | Ht 66.0 in | Wt 227.8 lb

## 2022-08-15 DIAGNOSIS — I5032 Chronic diastolic (congestive) heart failure: Secondary | ICD-10-CM | POA: Insufficient documentation

## 2022-08-15 DIAGNOSIS — I4819 Other persistent atrial fibrillation: Secondary | ICD-10-CM | POA: Diagnosis present

## 2022-08-15 DIAGNOSIS — Z7901 Long term (current) use of anticoagulants: Secondary | ICD-10-CM | POA: Insufficient documentation

## 2022-08-15 DIAGNOSIS — Z8249 Family history of ischemic heart disease and other diseases of the circulatory system: Secondary | ICD-10-CM | POA: Diagnosis not present

## 2022-08-15 DIAGNOSIS — E079 Disorder of thyroid, unspecified: Secondary | ICD-10-CM | POA: Diagnosis not present

## 2022-08-15 DIAGNOSIS — R0683 Snoring: Secondary | ICD-10-CM | POA: Diagnosis not present

## 2022-08-15 DIAGNOSIS — D6869 Other thrombophilia: Secondary | ICD-10-CM | POA: Diagnosis not present

## 2022-08-15 DIAGNOSIS — Z6836 Body mass index (BMI) 36.0-36.9, adult: Secondary | ICD-10-CM | POA: Diagnosis not present

## 2022-08-15 DIAGNOSIS — E669 Obesity, unspecified: Secondary | ICD-10-CM | POA: Insufficient documentation

## 2022-08-15 LAB — BASIC METABOLIC PANEL
Anion gap: 7 (ref 5–15)
BUN: 15 mg/dL (ref 8–23)
CO2: 27 mmol/L (ref 22–32)
Calcium: 9.3 mg/dL (ref 8.9–10.3)
Chloride: 105 mmol/L (ref 98–111)
Creatinine, Ser: 0.77 mg/dL (ref 0.44–1.00)
GFR, Estimated: >60 mL/min (ref >60)
Glucose, Bld: 85 mg/dL (ref 70–99)
Potassium: 4.1 mmol/L (ref 3.5–5.1)
Sodium: 139 mmol/L (ref 135–145)

## 2022-08-15 LAB — CBC
HCT: 37.6 % (ref 36.0–46.0)
Hemoglobin: 12.3 g/dL (ref 12.0–15.0)
MCH: 31.6 pg (ref 26.0–34.0)
MCHC: 32.7 g/dL (ref 30.0–36.0)
MCV: 96.7 fL (ref 80.0–100.0)
Platelets: 120 10*3/uL — ABNORMAL LOW (ref 150–400)
RBC: 3.89 MIL/uL (ref 3.87–5.11)
RDW: 12.6 % (ref 11.5–15.5)
WBC: 4.4 10*3/uL (ref 4.0–10.5)
nRBC: 0 % (ref 0.0–0.2)

## 2022-08-15 NOTE — H&P (View-Only) (Signed)
Primary Care Physician: Colon Branch, MD Primary Cardiologist: none Primary Electrophysiologist: none Referring Physician: [facility]   Brittney Walker is a 71 y.o. female with a history of thyroid disease s/p RAI, vulvar neoplasia, and atrial fibrillation who presents for follow up in the [facility].  The patient was initially diagnosed with atrial fibrillation 07/18/21 after presenting to the ED with symptoms of worsening SOB with exertion over several weeks. Patient was prescribed Eliquis for a CHADS2VASC score of 3.   She underwent electrical cardioversion on 10/11/2021.  Following the cardioversion she was noted to be severely anemic and underwent upper endoscopy that showed a large gastric ulcer, H. pylori positive.  Her echocardiogram showed dilated right heart chambers without evidence of left to right shunt, with mild-moderate reduced right ventricular systolic function, severely dilated right atrium and severe tricuspid regurgitation.  Labs were consistent with iron deficiency anemia.  She was enrolled in the alleviate HF trial and has an implantable loop recorder. Seen by Dr Sallyanne Kuster 08/07/22 and was found to be in afib with slow ventricular rates. Her ILR showed a 20% afib burden. She was asymptomatic at that time but since then she has noted increasing fatigue with exertion. No specific triggers that she could identify. No bleeding issues on anticoagulation.   Today, she denies symptoms of palpitations, chest pain, PND, dizziness, presyncope, syncope, bleeding, or neurologic sequela. The patient is tolerating medications without difficulties and is otherwise without complaint today.    Atrial Fibrillation Risk Factors:  she does have symptoms or diagnosis of sleep apnea. she does not have a history of rheumatic fever. she does not have a history of alcohol use. The patient does have a history of early familial atrial fibrillation or other  arrhythmias. Mother has afib, sister has PPM.  she has a BMI of Body mass index is 36.77 kg/m.Marland Kitchen Filed Weights   08/15/22 1439  Weight: 103.3 kg    Family History  Problem Relation Age of Onset   Seizures Father        Epilepsy   Hypertension Father    Heart attack Father        ?   Breast cancer Sister 23   Heart disease Mother    Hypertension Mother    Colon cancer Neg Hx    Pancreatic cancer Neg Hx    Esophageal cancer Neg Hx    Prostate cancer Neg Hx    Rectal cancer Neg Hx      Atrial Fibrillation Management history:  Previous antiarrhythmic drugs: none Previous cardioversions: 10/10/21 Previous ablations: none CHADS2VASC score: 4 Anticoagulation history: Eliquis   Past Medical History:  Diagnosis Date   Atrial fibrillation ([place]) 10/04/2021   Carotid bruit    w/ neg dopplers    CHF (congestive heart failure) ([place])    History of gastric ulcer 10/04/2021   Hyperthyroidism     s/p RAI 2004   Normocytic anemia    Persistent atrial fibrillation ([place])    a. Dx 07/2021; b. CHA2DS2VASc = 3-->Eliquis; c. 10/2021 s/p DCCV.   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   Vitamin D deficiency    Past Surgical History:  Procedure Laterality Date   ABDOMINAL HYSTERECTOMY  11/05/1995   TAH-LEIOMYOMATA-- no BSO   BIOPSY  12/25/2021   Procedure: BIOPSY;  Surgeon: Doran Stabler, MD;  Location: [facility];  Service: Gastroenterology;;   BIOPSY THYROID      bx of largest nodule 2005,  benign   BREAST SURGERY     x's 2--fibrocystic dz   CARDIOVERSION N/A 10/10/2021   Procedure: CARDIOVERSION;  Surgeon: Sanda Klein, MD;  Location: [facility];  Service: Cardiovascular;  Laterality: N/A;   CATARACT EXTRACTION, BILATERAL Bilateral    COLONOSCOPY WITH PROPOFOL N/A 12/25/2021   Procedure: COLONOSCOPY WITH PROPOFOL;  Surgeon: Doran Stabler, MD;  [facility];  Service: Gastroenterology;  Laterality: N/A;   ESOPHAGOGASTRODUODENOSCOPY (EGD)  WITH PROPOFOL N/A 10/12/2021   Procedure: ESOPHAGOGASTRODUODENOSCOPY (EGD) WITH PROPOFOL;  Surgeon: Doran Stabler, MD;  Location: [facility];  Service: Gastroenterology;  Laterality: N/A;   ESOPHAGOGASTRODUODENOSCOPY (EGD) WITH PROPOFOL N/A 12/25/2021   Procedure: ESOPHAGOGASTRODUODENOSCOPY (EGD) WITH PROPOFOL;  Surgeon: Doran Stabler, MD;  [facility];  Service: Gastroenterology;  Laterality: N/A;   POLYPECTOMY  12/25/2021   Procedure: POLYPECTOMY;  Surgeon: Doran Stabler, MD;  [facility];  Service: Gastroenterology;;   TONSILLECTOMY      Current Outpatient Medications  Medication Sig Dispense Refill   apixaban (ELIQUIS) 5 MG TABS tablet Take 1 tablet (5 mg total) by mouth 2 (two) times daily. 180 tablet 1   Ascorbic Acid (VITAMIN C) 1000 MG tablet Take 1,000 mg by mouth daily.     calcium carbonate (OS-CAL - DOSED IN MG OF ELEMENTAL CALCIUM) 1250 MG tablet Take 1,250 mg by mouth daily with breakfast.     cholecalciferol (VITAMIN D) 1000 UNITS tablet Take 1,000 Units by mouth daily.     furosemide (LASIX) 20 MG tablet As needed patient may take 60 MG Lasix PRN by mouth QD x 4 days as directed per Alleviate Research HF Study PRN plan. 90 tablet 3   Multiple Vitamin (MULTIVITAMIN WITH MINERALS) TABS tablet Take 1 tablet by mouth in the morning. Alive     omeprazole (PRILOSEC) 40 MG capsule TAKE 1 CAPSULE (40 MG TOTAL) BY MOUTH DAILY. 90 capsule 0   potassium chloride (KLOR-CON) 10 MEQ tablet Patient may take additional 30 mEq (1 tablet) potassium BY MOUTH DAILY AS DIRECTED for Alleviate HF Research Study   90 tablet 1   vitamin A 10000 UNIT capsule Take 10,000 Units by mouth daily.     vitamin B-12 (CYANOCOBALAMIN) 1000 MCG tablet Take 1,000 mcg by mouth daily.     vitamin E 400 UNIT capsule Take 400 Units by mouth daily.     zinc gluconate 50 MG tablet Take 50 mg by mouth daily.     No current facility-administered medications for this encounter.     No Known Allergies  Social History   Socioeconomic History   Marital status: Single    Spouse name: Not on file   Number of children: 0   Years of education: Not on file   Highest education level: Not on file  Occupational History   Occupation: retired---collections  Tobacco Use   Smoking status: Never   Smokeless tobacco: Never   Tobacco comments:    Never smoke 08/15/22  Vaping Use   Vaping Use: Never used  Substance and Sexual Activity   Alcohol use: No    Alcohol/week: 0.0 standard drinks of alcohol   Drug use: No   Sexual activity: Never    Comment: Virgin  Other Topics Concern   Not on file  Social History Narrative   Lives by herself    Social Determinants of Health   Financial Resource Strain: Medium Risk (07/31/2022)   Overall Financial Resource Strain (CARDIA)    Difficulty of  Paying Living Expenses: Somewhat hard  Food Insecurity: No Food Insecurity (07/31/2022)   Hunger Vital Sign    Worried About Running Out of Food in the Last Year: Never true    Ran Out of Food in the Last Year: Never true  Transportation Needs: No Transportation Needs (07/31/2022)   PRAPARE - Hydrologist (Medical): No    Lack of Transportation (Non-Medical): No  Physical Activity: Inactive (07/31/2022)   Exercise Vital Sign    Days of Exercise per Week: 0 days    Minutes of Exercise per Session: 0 min  Stress: No Stress Concern Present (07/31/2022)   [place]    Feeling of Stress : Not at all  Social Connections: Moderately Isolated (07/31/2022)   Social Connection and Isolation Panel [NHANES]    Frequency of Communication with Friends and Family: Once a week    Frequency of Social Gatherings with Friends and Family: Once a week    Attends Religious Services: More than 4 times per year    Active Member of [employer] or Organizations: Yes    Attends Archivist Meetings: More than  4 times per year    Marital Status: Never married  Intimate Partner Violence: Not At Risk (07/31/2022)   Humiliation, Afraid, Rape, and Kick questionnaire    Fear of Current or Ex-Partner: No    Emotionally Abused: No    Physically Abused: No    Sexually Abused: No     ROS- All systems are reviewed and negative except as per the HPI above.  Physical Exam: Vitals:   08/15/22 1439  BP: 114/80  Pulse: (!) 41  Weight: 103.3 kg  Height: '5\' 6"'$  (1.676 m)    GEN- The patient is a well appearing female, alert and oriented x 3 today.   HEENT-head normocephalic, atraumatic, sclera clear, conjunctiva pink, hearing intact, trachea midline. Lungs- Clear to ausculation bilaterally, normal work of breathing Heart- irregular rate and rhythm, bradycardia, no murmurs, rubs or gallops  GI- soft, NT, ND, + BS Extremities- no clubbing, cyanosis, or edema MS- no significant deformity or atrophy Skin- no rash or lesion Psych- euthymic mood, full affect Neuro- strength and sensation are intact   Wt Readings from Last 3 Encounters:  08/15/22 103.3 kg  08/07/22 102 kg  07/31/22 103.4 kg    EKG today demonstrates  Afib with slow ventricular response Vent. rate 41 BPM PR interval * ms QRS duration 86 ms QT/QTcB 456/376 ms  Echo 11/08/21 1. Left ventricular ejection fraction, by estimation, is 60 to 65%. The  left ventricle has normal function. The left ventricle has no regional  wall motion abnormalities. There is mild left ventricular hypertrophy of  the septal segment. Left ventricular diastolic parameters are consistent with Grade I diastolic dysfunction (impaired relaxation). There is the interventricular septum is flattened in systole and diastole, consistent with right ventricular pressure and volume overload.   2. Right ventricular systolic function is mildly reduced. The right  ventricular size is moderately enlarged. There is moderately elevated  pulmonary artery systolic pressure. The  estimated right ventricular  systolic pressure is 58.8 mmHg.   3. Right atrial size was moderately dilated.   4. The mitral valve is normal in structure. Mild mitral valve  regurgitation. No evidence of mitral stenosis. There is mild holosystolic  prolapse of the middle segment of the anterior leaflet of the mitral  valve.   5. Tricuspid valve regurgitation is  moderate to severe.   6. The aortic valve is normal in structure. Aortic valve regurgitation is  not visualized. No aortic stenosis is present.   7. The inferior vena cava is dilated in size with <50% respiratory  variability, suggesting right atrial pressure of 15 mmHg.     Epic records are reviewed at length today  CHA2DS2-VASc Score = 4  The patient's score is based upon: CHF History: 1 HTN History: 0 Diabetes History: 0 Stroke History: 0 Vascular Disease History: 1 (aortic atherosclerosis) Age Score: 1 Gender Score: 1       ASSESSMENT AND PLAN: 1. Persistent atrial fibrillation  The patient's CHA2DS2-VASc score is 4, indicating a 4.8% annual risk of stroke.   Patient remains in afib today with symptoms of fatigue.  We discussed rhythm control options. Will plan for DCCV since she appears persistent. Check bmet/cbc today. Agree that it would be best to avoid AV nodal agents and most AADs given her bradycardia.  Continue Eliquis 5 mg BID  2. Secondary Hypercoagulable State (ICD10:  D68.69) The patient is at significant risk for stroke/thromboembolism based upon her CHA2DS2-VASc Score of 4.  Continue Apixaban (Eliquis).   3. Obesity Body mass index is 36.77 kg/m. Lifestyle modification was discussed and encouraged including regular physical activity and weight reduction.  4. Snoring/witnessed apnea/suspected OSA Sleep study deferred due to lack of insurance coverage.   5. Chronic diastolic CHF Enrolled in Alleviate HF Fluid status appears stable today.   Follow up in the AF clinic post DCCV and then with Dr  Sallyanne Kuster as scheduled.    [facility] [address] [place], [place] [address] [phone] 08/15/2022 2:49 PM

## 2022-08-15 NOTE — Patient Instructions (Signed)
Cardioversion scheduled for Friday, October 20th  - Arrive at the [facility] and go to admitting at 930am  - Do not eat or drink anything after midnight the night prior to your procedure.  - Take all your morning medication (except diabetic medications) with a sip of water prior to arrival.  - You will not be able to drive home after your procedure.  - Do NOT miss any doses of your blood thinner - if you should miss a dose please notify our office immediately.  - If you feel as if you go back into normal rhythm prior to scheduled cardioversion, please notify our office immediately. If your procedure is canceled in the cardioversion suite you will be charged a cancellation fee.

## 2022-08-15 NOTE — Progress Notes (Signed)
Primary Care Physician: Colon Branch, MD Primary Cardiologist: none Primary Electrophysiologist: none Referring Physician: [facility]   Brittney Walker is a 71 y.o. female with a history of thyroid disease s/p RAI, vulvar neoplasia, and atrial fibrillation who presents for follow up in the [facility].  The patient was initially diagnosed with atrial fibrillation 07/18/21 after presenting to the ED with symptoms of worsening SOB with exertion over several weeks. Patient was prescribed Eliquis for a CHADS2VASC score of 3.   She underwent electrical cardioversion on 10/11/2021.  Following the cardioversion she was noted to be severely anemic and underwent upper endoscopy that showed a large gastric ulcer, H. pylori positive.  Her echocardiogram showed dilated right heart chambers without evidence of left to right shunt, with mild-moderate reduced right ventricular systolic function, severely dilated right atrium and severe tricuspid regurgitation.  Labs were consistent with iron deficiency anemia.  She was enrolled in the alleviate HF trial and has an implantable loop recorder. Seen by Dr Sallyanne Kuster 08/07/22 and was found to be in afib with slow ventricular rates. Her ILR showed a 20% afib burden. She was asymptomatic at that time but since then she has noted increasing fatigue with exertion. No specific triggers that she could identify. No bleeding issues on anticoagulation.   Today, she denies symptoms of palpitations, chest pain, PND, dizziness, presyncope, syncope, bleeding, or neurologic sequela. The patient is tolerating medications without difficulties and is otherwise without complaint today.    Atrial Fibrillation Risk Factors:  she does have symptoms or diagnosis of sleep apnea. she does not have a history of rheumatic fever. she does not have a history of alcohol use. The patient does have a history of early familial atrial fibrillation or other  arrhythmias. Mother has afib, sister has PPM.  she has a BMI of Body mass index is 36.77 kg/m.Marland Kitchen Filed Weights   08/15/22 1439  Weight: 103.3 kg    Family History  Problem Relation Age of Onset   Seizures Father        Epilepsy   Hypertension Father    Heart attack Father        ?   Breast cancer Sister 83   Heart disease Mother    Hypertension Mother    Colon cancer Neg Hx    Pancreatic cancer Neg Hx    Esophageal cancer Neg Hx    Prostate cancer Neg Hx    Rectal cancer Neg Hx      Atrial Fibrillation Management history:  Previous antiarrhythmic drugs: none Previous cardioversions: 10/10/21 Previous ablations: none CHADS2VASC score: 4 Anticoagulation history: Eliquis   Past Medical History:  Diagnosis Date   Atrial fibrillation ([place]) 10/04/2021   Carotid bruit    w/ neg dopplers    CHF (congestive heart failure) ([place])    History of gastric ulcer 10/04/2021   Hyperthyroidism     s/p RAI 2004   Normocytic anemia    Persistent atrial fibrillation ([place])    a. Dx 07/2021; b. CHA2DS2VASc = 3-->Eliquis; c. 10/2021 s/p DCCV.   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   Vitamin D deficiency    Past Surgical History:  Procedure Laterality Date   ABDOMINAL HYSTERECTOMY  11/05/1995   TAH-LEIOMYOMATA-- no BSO   BIOPSY  12/25/2021   Procedure: BIOPSY;  Surgeon: Doran Stabler, MD;  [facility];  Service: Gastroenterology;;   BIOPSY THYROID      bx of largest nodule 2005,  benign   BREAST SURGERY     x's 2--fibrocystic dz   CARDIOVERSION N/A 10/10/2021   Procedure: CARDIOVERSION;  Surgeon: Sanda Klein, MD;  Location: [facility];  Service: Cardiovascular;  Laterality: N/A;   CATARACT EXTRACTION, BILATERAL Bilateral    COLONOSCOPY WITH PROPOFOL N/A 12/25/2021   Procedure: COLONOSCOPY WITH PROPOFOL;  Surgeon: Doran Stabler, MD;  [facility];  Service: Gastroenterology;  Laterality: N/A;   ESOPHAGOGASTRODUODENOSCOPY (EGD)  WITH PROPOFOL N/A 10/12/2021   Procedure: ESOPHAGOGASTRODUODENOSCOPY (EGD) WITH PROPOFOL;  Surgeon: Doran Stabler, MD;  Location: [facility];  Service: Gastroenterology;  Laterality: N/A;   ESOPHAGOGASTRODUODENOSCOPY (EGD) WITH PROPOFOL N/A 12/25/2021   Procedure: ESOPHAGOGASTRODUODENOSCOPY (EGD) WITH PROPOFOL;  Surgeon: Doran Stabler, MD;  [facility];  Service: Gastroenterology;  Laterality: N/A;   POLYPECTOMY  12/25/2021   Procedure: POLYPECTOMY;  Surgeon: Doran Stabler, MD;  [facility];  Service: Gastroenterology;;   TONSILLECTOMY      Current Outpatient Medications  Medication Sig Dispense Refill   apixaban (ELIQUIS) 5 MG TABS tablet Take 1 tablet (5 mg total) by mouth 2 (two) times daily. 180 tablet 1   Ascorbic Acid (VITAMIN C) 1000 MG tablet Take 1,000 mg by mouth daily.     calcium carbonate (OS-CAL - DOSED IN MG OF ELEMENTAL CALCIUM) 1250 MG tablet Take 1,250 mg by mouth daily with breakfast.     cholecalciferol (VITAMIN D) 1000 UNITS tablet Take 1,000 Units by mouth daily.     furosemide (LASIX) 20 MG tablet As needed patient may take 60 MG Lasix PRN by mouth QD x 4 days as directed per Alleviate Research HF Study PRN plan. 90 tablet 3   Multiple Vitamin (MULTIVITAMIN WITH MINERALS) TABS tablet Take 1 tablet by mouth in the morning. Alive     omeprazole (PRILOSEC) 40 MG capsule TAKE 1 CAPSULE (40 MG TOTAL) BY MOUTH DAILY. 90 capsule 0   potassium chloride (KLOR-CON) 10 MEQ tablet Patient may take additional 30 mEq (1 tablet) potassium BY MOUTH DAILY AS DIRECTED for Alleviate HF Research Study   90 tablet 1   vitamin A 10000 UNIT capsule Take 10,000 Units by mouth daily.     vitamin B-12 (CYANOCOBALAMIN) 1000 MCG tablet Take 1,000 mcg by mouth daily.     vitamin E 400 UNIT capsule Take 400 Units by mouth daily.     zinc gluconate 50 MG tablet Take 50 mg by mouth daily.     No current facility-administered medications for this encounter.     No Known Allergies  Social History   Socioeconomic History   Marital status: Single    Spouse name: Not on file   Number of children: 0   Years of education: Not on file   Highest education level: Not on file  Occupational History   Occupation: retired---collections  Tobacco Use   Smoking status: Never   Smokeless tobacco: Never   Tobacco comments:    Never smoke 08/15/22  Vaping Use   Vaping Use: Never used  Substance and Sexual Activity   Alcohol use: No    Alcohol/week: 0.0 standard drinks of alcohol   Drug use: No   Sexual activity: Never    Comment: Virgin  Other Topics Concern   Not on file  Social History Narrative   Lives by herself    Social Determinants of Health   Financial Resource Strain: Medium Risk (07/31/2022)   Overall Financial Resource Strain (CARDIA)    Difficulty of  Paying Living Expenses: Somewhat hard  Food Insecurity: No Food Insecurity (07/31/2022)   Hunger Vital Sign    Worried About Running Out of Food in the Last Year: Never true    Ran Out of Food in the Last Year: Never true  Transportation Needs: No Transportation Needs (07/31/2022)   PRAPARE - Hydrologist (Medical): No    Lack of Transportation (Non-Medical): No  Physical Activity: Inactive (07/31/2022)   Exercise Vital Sign    Days of Exercise per Week: 0 days    Minutes of Exercise per Session: 0 min  Stress: No Stress Concern Present (07/31/2022)   [place]    Feeling of Stress : Not at all  Social Connections: Moderately Isolated (07/31/2022)   Social Connection and Isolation Panel [NHANES]    Frequency of Communication with Friends and Family: Once a week    Frequency of Social Gatherings with Friends and Family: Once a week    Attends Religious Services: More than 4 times per year    Active Member of [employer] or Organizations: Yes    Attends Archivist Meetings: More than  4 times per year    Marital Status: Never married  Intimate Partner Violence: Not At Risk (07/31/2022)   Humiliation, Afraid, Rape, and Kick questionnaire    Fear of Current or Ex-Partner: No    Emotionally Abused: No    Physically Abused: No    Sexually Abused: No     ROS- All systems are reviewed and negative except as per the HPI above.  Physical Exam: Vitals:   08/15/22 1439  BP: 114/80  Pulse: (!) 41  Weight: 103.3 kg  Height: '5\' 6"'$  (1.676 m)    GEN- The patient is a well appearing female, alert and oriented x 3 today.   HEENT-head normocephalic, atraumatic, sclera clear, conjunctiva pink, hearing intact, trachea midline. Lungs- Clear to ausculation bilaterally, normal work of breathing Heart- irregular rate and rhythm, bradycardia, no murmurs, rubs or gallops  GI- soft, NT, ND, + BS Extremities- no clubbing, cyanosis, or edema MS- no significant deformity or atrophy Skin- no rash or lesion Psych- euthymic mood, full affect Neuro- strength and sensation are intact   Wt Readings from Last 3 Encounters:  08/15/22 103.3 kg  08/07/22 102 kg  07/31/22 103.4 kg    EKG today demonstrates  Afib with slow ventricular response Vent. rate 41 BPM PR interval * ms QRS duration 86 ms QT/QTcB 456/376 ms  Echo 11/08/21 1. Left ventricular ejection fraction, by estimation, is 60 to 65%. The  left ventricle has normal function. The left ventricle has no regional  wall motion abnormalities. There is mild left ventricular hypertrophy of  the septal segment. Left ventricular diastolic parameters are consistent with Grade I diastolic dysfunction (impaired relaxation). There is the interventricular septum is flattened in systole and diastole, consistent with right ventricular pressure and volume overload.   2. Right ventricular systolic function is mildly reduced. The right  ventricular size is moderately enlarged. There is moderately elevated  pulmonary artery systolic pressure. The  estimated right ventricular  systolic pressure is 16.1 mmHg.   3. Right atrial size was moderately dilated.   4. The mitral valve is normal in structure. Mild mitral valve  regurgitation. No evidence of mitral stenosis. There is mild holosystolic  prolapse of the middle segment of the anterior leaflet of the mitral  valve.   5. Tricuspid valve regurgitation is  moderate to severe.   6. The aortic valve is normal in structure. Aortic valve regurgitation is  not visualized. No aortic stenosis is present.   7. The inferior vena cava is dilated in size with <50% respiratory  variability, suggesting right atrial pressure of 15 mmHg.     Epic records are reviewed at length today  CHA2DS2-VASc Score = 4  The patient's score is based upon: CHF History: 1 HTN History: 0 Diabetes History: 0 Stroke History: 0 Vascular Disease History: 1 (aortic atherosclerosis) Age Score: 1 Gender Score: 1       ASSESSMENT AND PLAN: 1. Persistent atrial fibrillation  The patient's CHA2DS2-VASc score is 4, indicating a 4.8% annual risk of stroke.   Patient remains in afib today with symptoms of fatigue.  We discussed rhythm control options. Will plan for DCCV since she appears persistent. Check bmet/cbc today. Agree that it would be best to avoid AV nodal agents and most AADs given her bradycardia.  Continue Eliquis 5 mg BID  2. Secondary Hypercoagulable State (ICD10:  D68.69) The patient is at significant risk for stroke/thromboembolism based upon her CHA2DS2-VASc Score of 4.  Continue Apixaban (Eliquis).   3. Obesity Body mass index is 36.77 kg/m. Lifestyle modification was discussed and encouraged including regular physical activity and weight reduction.  4. Snoring/witnessed apnea/suspected OSA Sleep study deferred due to lack of insurance coverage.   5. Chronic diastolic CHF Enrolled in Alleviate HF Fluid status appears stable today.   Follow up in the AF clinic post DCCV and then with Dr  Sallyanne Kuster as scheduled.    [facility] [address] [place], [place] [address] [phone] 08/15/2022 2:49 PM

## 2022-08-19 DIAGNOSIS — Z006 Encounter for examination for normal comparison and control in clinical research program: Secondary | ICD-10-CM

## 2022-08-19 NOTE — Research (Cosign Needed Addendum)
Alleviate HF Study 10 Month f/u  No medication changes to report at this time.Patient has scheduled outpatient DCCV for A fib on 10/20//2023.   Current Outpatient Medications:    apixaban (ELIQUIS) 5 MG TABS tablet, Take 1 tablet (5 mg total) by mouth 2 (two) times daily., Disp: 180 tablet, Rfl: 1   Ascorbic Acid (VITAMIN C) 1000 MG tablet, Take 1,000 mg by mouth daily., Disp: , Rfl:    calcium carbonate (OS-CAL - DOSED IN MG OF ELEMENTAL CALCIUM) 1250 MG tablet, Take 1,250 mg by mouth daily with breakfast., Disp: , Rfl:    cholecalciferol (VITAMIN D) 1000 UNITS tablet, Take 1,000 Units by mouth daily., Disp: , Rfl:    furosemide (LASIX) 20 MG tablet, As needed patient may take 60 MG Lasix PRN by mouth QD x 4 days as directed per Alleviate Research HF Study PRN plan., Disp: 90 tablet, Rfl: 3   Multiple Vitamin (MULTIVITAMIN WITH MINERALS) TABS tablet, Take 1 tablet by mouth in the morning. Alive, Disp: , Rfl:    omeprazole (PRILOSEC) 40 MG capsule, TAKE 1 CAPSULE (40 MG TOTAL) BY MOUTH DAILY., Disp: 90 capsule, Rfl: 0   potassium chloride (KLOR-CON) 10 MEQ tablet, Patient may take additional 30 mEq (1 tablet) potassium BY MOUTH DAILY AS DIRECTED for Alleviate HF Research Study  , Disp: 90 tablet, Rfl: 1   vitamin A 10000 UNIT capsule, Take 10,000 Units by mouth daily., Disp: , Rfl:    vitamin B-12 (CYANOCOBALAMIN) 1000 MCG tablet, Take 1,000 mcg by mouth daily., Disp: , Rfl:    vitamin E 400 UNIT capsule, Take 400 Units by mouth daily., Disp: , Rfl:    zinc gluconate 50 MG tablet, Take 50 mg by mouth daily., Disp: , Rfl:

## 2022-08-22 ENCOUNTER — Encounter (HOSPITAL_COMMUNITY): Payer: Self-pay | Admitting: Internal Medicine

## 2022-08-22 NOTE — Anesthesia Preprocedure Evaluation (Addendum)
Anesthesia Evaluation  Patient identified by MRN, date of birth, ID band Patient awake    Reviewed: Allergy & Precautions, NPO status , Patient's Chart, lab work & pertinent test results, reviewed documented beta blocker date and time   Airway Mallampati: II  TM Distance: >3 FB Neck ROM: Full    Dental  (+) Partial Lower   Pulmonary neg pulmonary ROS   Pulmonary exam normal breath sounds clear to auscultation       Cardiovascular +CHF  + dysrhythmias Atrial Fibrillation  Rhythm:Irregular Rate:Normal     Neuro/Psych negative neurological ROS  negative psych ROS   GI/Hepatic Neg liver ROS, PUD,,,Hx/o gastric ulcer 10/04/2021   Endo/Other  Hypothyroidism Hyperthyroidism Morbid obesityHx/o RAI therapy currently on thyroid supplements  Renal/GU negative Renal ROS  negative genitourinary   Musculoskeletal  (+) Arthritis ,    Abdominal  (+) + obese  Peds  Hematology  (+) Blood dyscrasia, anemia Eliquis therpay- last dose    Anesthesia Other Findings   Reproductive/Obstetrics                              Anesthesia Physical Anesthesia Plan  ASA: 3  Anesthesia Plan: General   Post-op Pain Management: Minimal or no pain anticipated   Induction: Intravenous  PONV Risk Score and Plan: 3 and Treatment may vary due to age or medical condition  Airway Management Planned: Natural Airway and Mask  Additional Equipment: None  Intra-op Plan:   Post-operative Plan:   Informed Consent: I have reviewed the patients History and Physical, chart, labs and discussed the procedure including the risks, benefits and alternatives for the proposed anesthesia with the patient or authorized representative who has indicated his/her understanding and acceptance.     Dental advisory given  Plan Discussed with: CRNA and Anesthesiologist  Anesthesia Plan Comments:          Anesthesia Quick  Evaluation

## 2022-08-23 ENCOUNTER — Ambulatory Visit (HOSPITAL_BASED_OUTPATIENT_CLINIC_OR_DEPARTMENT_OTHER): Payer: 59 | Admitting: Anesthesiology

## 2022-08-23 ENCOUNTER — Other Ambulatory Visit: Payer: Self-pay

## 2022-08-23 ENCOUNTER — Ambulatory Visit (HOSPITAL_COMMUNITY): Payer: 59 | Admitting: Anesthesiology

## 2022-08-23 ENCOUNTER — Encounter (HOSPITAL_COMMUNITY): Payer: Self-pay | Admitting: Internal Medicine

## 2022-08-23 ENCOUNTER — Ambulatory Visit (HOSPITAL_COMMUNITY)
Admission: RE | Admit: 2022-08-23 | Discharge: 2022-08-23 | Disposition: A | Discharge status: Home / Self Care | Payer: 59 | Attending: Internal Medicine | Admitting: Internal Medicine

## 2022-08-23 ENCOUNTER — Encounter (HOSPITAL_COMMUNITY)
Admission: RE | Disposition: A | Discharge status: Home / Self Care | Payer: Self-pay | Source: Home / Self Care | Attending: Internal Medicine

## 2022-08-23 DIAGNOSIS — Z7901 Long term (current) use of anticoagulants: Secondary | ICD-10-CM | POA: Insufficient documentation

## 2022-08-23 DIAGNOSIS — I4819 Other persistent atrial fibrillation: Secondary | ICD-10-CM | POA: Diagnosis not present

## 2022-08-23 DIAGNOSIS — E039 Hypothyroidism, unspecified: Secondary | ICD-10-CM

## 2022-08-23 DIAGNOSIS — I509 Heart failure, unspecified: Secondary | ICD-10-CM

## 2022-08-23 DIAGNOSIS — Z8711 Personal history of peptic ulcer disease: Secondary | ICD-10-CM | POA: Insufficient documentation

## 2022-08-23 DIAGNOSIS — I4891 Unspecified atrial fibrillation: Secondary | ICD-10-CM

## 2022-08-23 DIAGNOSIS — D638 Anemia in other chronic diseases classified elsewhere: Secondary | ICD-10-CM | POA: Diagnosis not present

## 2022-08-23 DIAGNOSIS — Z6841 Body Mass Index (BMI) 40.0 and over, adult: Secondary | ICD-10-CM | POA: Diagnosis not present

## 2022-08-23 HISTORY — PX: CARDIOVERSION: SHX1299

## 2022-08-23 SURGERY — CARDIOVERSION
Anesthesia: General

## 2022-08-23 MED ORDER — SODIUM CHLORIDE 0.9 % IV SOLN: INTRAVENOUS | Status: DC

## 2022-08-23 MED ORDER — LIDOCAINE HCL (CARDIAC) PF 100 MG/5ML IV SOSY
PREFILLED_SYRINGE | INTRAVENOUS | Status: DC | PRN
  Administered 2022-08-23: 50 mg via INTRAVENOUS

## 2022-08-23 MED ORDER — PROPOFOL 10 MG/ML IV BOLUS
INTRAVENOUS | Status: DC | PRN
  Administered 2022-08-23: 50 mg via INTRAVENOUS

## 2022-08-23 NOTE — CV Procedure (Signed)
Procedure: Electrical Cardioversion Indications:   atrial fibrillation with slow ventricular response  Procedure Details:  Consent: Risks of procedure as well as the alternatives and risks of each were explained to the (patient/caregiver).  Consent for procedure obtained.  Time Out: Verified patient identification, verified procedure, site/side was marked, verified correct patient position, special equipment/implants available, medications/allergies/relevent history reviewed, required imaging and test results available. PERFORMED.  Patient placed on cardiac monitor, pulse oximetry, supplemental oxygen as necessary.  Sedation given:  propofol Pacer pads placed anterior and posterior chest.  Cardioverted 2 time(s).  Cardioversion with synchronized biphasic 120J, 200J shock.  Evaluation: Findings: Post procedure EKG shows: NSR Complications: None Patient did tolerate procedure well.  Time Spent Directly with the Patient:  30 minutes   Elouise Munroe 08/23/2022, 10:18 AM
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
## 2022-08-23 NOTE — Discharge Instructions (Signed)

## 2022-08-23 NOTE — Transfer of Care (Signed)
Immediate Anesthesia Transfer of Care Note  Patient: Brittney Walker  Procedure(s) Performed: CARDIOVERSION  Patient Location: Endoscopy Unit  Anesthesia Type:MAC  Level of Consciousness: awake  Airway & Oxygen Therapy: Patient Spontanous Breathing  Post-op Assessment: Report given to RN and Post -op Vital signs reviewed and stable  Post vital signs: Reviewed and stable  Last Vitals:  Vitals Value Taken Time  BP    Temp    Pulse    Resp    SpO2      Last Pain:  Vitals:   08/23/22 0921  TempSrc: Temporal  PainSc: 0-No pain         Complications: No notable events documented.

## 2022-08-23 NOTE — Anesthesia Postprocedure Evaluation (Signed)
Anesthesia Post Note  Patient: Brittney Walker  Procedure(s) Performed: CARDIOVERSION     Patient location during evaluation: PACU Anesthesia Type: General Level of consciousness: awake and alert and oriented Pain management: pain level controlled Vital Signs Assessment: post-procedure vital signs reviewed and stable Respiratory status: spontaneous breathing, nonlabored ventilation and respiratory function stable Cardiovascular status: blood pressure returned to baseline and stable Postop Assessment: no apparent nausea or vomiting Anesthetic complications: no   No notable events documented.  Last Vitals:  Vitals:   08/23/22 1030 08/23/22 1040  BP: 114/62 119/62  Pulse: (!) 54 (!) 50  Resp: 18 18  Temp:    SpO2: 96% 95%    Last Pain:  Vitals:   08/23/22 1040  TempSrc:   PainSc: 0-No pain                 Ferne Ellingwood A.

## 2022-08-23 NOTE — Interval H&P Note (Signed)
History and Physical Interval Note:  08/23/2022 10:00 AM  Brittney Walker  has presented today for surgery, with the diagnosis of AFIB.  The various methods of treatment have been discussed with the patient and family. After consideration of risks, benefits and other options for treatment, the patient has consented to  Procedure(s): CARDIOVERSION (N/A) as a surgical intervention.  The patient's history has been reviewed, patient examined, no change in status, stable for surgery.  I have reviewed the patient's chart and labs.  Questions were answered to the patient's satisfaction.     Elouise Munroe

## 2022-08-26 ENCOUNTER — Encounter (HOSPITAL_COMMUNITY): Payer: Self-pay | Admitting: Internal Medicine

## 2022-08-29 ENCOUNTER — Encounter (HOSPITAL_COMMUNITY): Payer: Self-pay | Admitting: Physician Assistant

## 2022-08-29 ENCOUNTER — Ambulatory Visit (HOSPITAL_COMMUNITY)
Admission: RE | Admit: 2022-08-29 | Discharge: 2022-08-29 | Disposition: A | Discharge status: Home / Self Care | Payer: 59 | Source: Ambulatory Visit | Attending: Physician Assistant | Admitting: Physician Assistant

## 2022-08-29 VITALS — BP 108/60 | HR 54 | Ht 66.0 in | Wt 224.8 lb

## 2022-08-29 DIAGNOSIS — I5022 Chronic systolic (congestive) heart failure: Secondary | ICD-10-CM | POA: Insufficient documentation

## 2022-08-29 DIAGNOSIS — I4819 Other persistent atrial fibrillation: Secondary | ICD-10-CM

## 2022-08-29 DIAGNOSIS — D6869 Other thrombophilia: Secondary | ICD-10-CM | POA: Diagnosis not present

## 2022-08-29 DIAGNOSIS — Z6836 Body mass index (BMI) 36.0-36.9, adult: Secondary | ICD-10-CM | POA: Diagnosis not present

## 2022-08-29 DIAGNOSIS — I480 Paroxysmal atrial fibrillation: Secondary | ICD-10-CM

## 2022-08-29 DIAGNOSIS — R001 Bradycardia, unspecified: Secondary | ICD-10-CM

## 2022-08-29 DIAGNOSIS — Z7901 Long term (current) use of anticoagulants: Secondary | ICD-10-CM | POA: Diagnosis not present

## 2022-08-29 DIAGNOSIS — R400 Somnolence: Secondary | ICD-10-CM

## 2022-08-29 DIAGNOSIS — R0683 Snoring: Secondary | ICD-10-CM | POA: Diagnosis not present

## 2022-08-29 DIAGNOSIS — E669 Obesity, unspecified: Secondary | ICD-10-CM | POA: Insufficient documentation

## 2022-08-29 NOTE — Progress Notes (Signed)
Primary Care Physician: Colon Branch, MD Primary Cardiologist: Dr Sallyanne Kuster  Primary Electrophysiologist: none Referring Physician: [facility]   Brittney Walker is a 71 y.o. female with a history of thyroid disease s/p RAI, vulvar neoplasia, and atrial fibrillation who presents for follow up in the [facility].  The patient was initially diagnosed with atrial fibrillation 07/18/21 after presenting to the ED with symptoms of worsening SOB with exertion over several weeks. Patient was prescribed Eliquis for a CHADS2VASC score of 3.   She underwent electrical cardioversion on 10/11/2021.  Following the cardioversion she was noted to be severely anemic and underwent upper endoscopy that showed a large gastric ulcer, H. pylori positive.  Her echocardiogram showed dilated right heart chambers without evidence of left to right shunt, with mild-moderate reduced right ventricular systolic function, severely dilated right atrium and severe tricuspid regurgitation.  Labs were consistent with iron deficiency anemia.  She was enrolled in the alleviate HF trial and has an implantable loop recorder. Seen by Dr Sallyanne Kuster 08/07/22 and was found to be in afib with slow ventricular rates. Her ILR showed a 20% afib burden. She was asymptomatic at that time but since then she has noted increasing fatigue with exertion.   On follow up today, she is s/p DCCV on 08/23/22. She reports that she feels much better in SR with more energy. No bleeding issues on anticoagulation.   Today, she denies symptoms of palpitations, chest pain, PND, dizziness, presyncope, syncope, bleeding, or neurologic sequela. The patient is tolerating medications without difficulties and is otherwise without complaint today.    Atrial Fibrillation Risk Factors:  she does have symptoms or diagnosis of sleep apnea. she does not have a history of rheumatic fever. she does not have a history of alcohol use. The  patient does have a history of early familial atrial fibrillation or other arrhythmias. Mother has afib, sister has PPM.  she has a BMI of Body mass index is 36.28 kg/m.Marland Kitchen Filed Weights   08/29/22 1356  Weight: 102 kg    Family History  Problem Relation Age of Onset   Seizures Father        Epilepsy   Hypertension Father    Heart attack Father        ?   Breast cancer Sister 51   Heart disease Mother    Hypertension Mother    Colon cancer Neg Hx    Pancreatic cancer Neg Hx    Esophageal cancer Neg Hx    Prostate cancer Neg Hx    Rectal cancer Neg Hx      Atrial Fibrillation Management history:  Previous antiarrhythmic drugs: none Previous cardioversions: 10/10/21, 08/23/22 Previous ablations: none CHADS2VASC score: 4 Anticoagulation history: Eliquis   Past Medical History:  Diagnosis Date   Atrial fibrillation ([place]) 10/04/2021   Carotid bruit    w/ neg dopplers    CHF (congestive heart failure) ([place])    History of gastric ulcer 10/04/2021   Hyperthyroidism     s/p RAI 2004   Normocytic anemia    Persistent atrial fibrillation ([place])    a. Dx 07/2021; b. CHA2DS2VASc = 3-->Eliquis; c. 10/2021 s/p DCCV.   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   Vitamin D deficiency    Past Surgical History:  Procedure Laterality Date   ABDOMINAL HYSTERECTOMY  11/05/1995   TAH-LEIOMYOMATA-- no BSO   BIOPSY  12/25/2021   Procedure: BIOPSY;  Surgeon: Doran Stabler, MD;  Location: [facility];  Service: Gastroenterology;;   BIOPSY THYROID      bx of largest nodule 2005, benign   BREAST SURGERY     x's 2--fibrocystic dz   CARDIOVERSION N/A 10/10/2021   Procedure: CARDIOVERSION;  Surgeon: Sanda Klein, MD;  Location: [place];  Service: Cardiovascular;  Laterality: N/A;   CARDIOVERSION N/A 08/23/2022   Procedure: CARDIOVERSION;  Surgeon: Elouise Munroe, MD;  Location: [place];  Service: Cardiovascular;  Laterality: N/A;   CATARACT EXTRACTION,  BILATERAL Bilateral    COLONOSCOPY WITH PROPOFOL N/A 12/25/2021   Procedure: COLONOSCOPY WITH PROPOFOL;  Surgeon: Doran Stabler, MD;  Location: [facility];  Service: Gastroenterology;  Laterality: N/A;   ESOPHAGOGASTRODUODENOSCOPY (EGD) WITH PROPOFOL N/A 10/12/2021   Procedure: ESOPHAGOGASTRODUODENOSCOPY (EGD) WITH PROPOFOL;  Surgeon: Doran Stabler, MD;  Location: [place];  Service: Gastroenterology;  Laterality: N/A;   ESOPHAGOGASTRODUODENOSCOPY (EGD) WITH PROPOFOL N/A 12/25/2021   Procedure: ESOPHAGOGASTRODUODENOSCOPY (EGD) WITH PROPOFOL;  Surgeon: Doran Stabler, MD;  Location: [facility];  Service: Gastroenterology;  Laterality: N/A;   POLYPECTOMY  12/25/2021   Procedure: POLYPECTOMY;  Surgeon: Doran Stabler, MD;  Location: [facility];  Service: Gastroenterology;;   TONSILLECTOMY      Current Outpatient Medications  Medication Sig Dispense Refill   apixaban (ELIQUIS) 5 MG TABS tablet Take 1 tablet (5 mg total) by mouth 2 (two) times daily. 180 tablet 1   Ascorbic Acid (VITAMIN C) 1000 MG tablet Take 1,000 mg by mouth daily.     calcium carbonate (OS-CAL - DOSED IN MG OF ELEMENTAL CALCIUM) 1250 MG tablet Take 1,250 mg by mouth daily with breakfast.     cholecalciferol (VITAMIN D) 1000 UNITS tablet Take 1,000 Units by mouth daily.     furosemide (LASIX) 20 MG tablet As needed patient may take 60 MG Lasix PRN by mouth QD x 4 days as directed per Alleviate Research HF Study PRN plan. 90 tablet 3   Multiple Vitamin (MULTIVITAMIN WITH MINERALS) TABS tablet Take 1 tablet by mouth in the morning. Alive     omeprazole (PRILOSEC) 40 MG capsule TAKE 1 CAPSULE (40 MG TOTAL) BY MOUTH DAILY. 90 capsule 0   potassium chloride (KLOR-CON) 10 MEQ tablet Patient may take additional 30 mEq (1 tablet) potassium BY MOUTH DAILY AS DIRECTED for Alleviate HF Research Study   90 tablet 1   vitamin A 10000 UNIT capsule Take 10,000 Units by mouth daily.     vitamin B-12 (CYANOCOBALAMIN)  1000 MCG tablet Take 1,000 mcg by mouth daily.     vitamin E 400 UNIT capsule Take 400 Units by mouth daily.     zinc gluconate 50 MG tablet Take 50 mg by mouth daily.     No current facility-administered medications for this encounter.    No Known Allergies  Social History   Socioeconomic History   Marital status: Single    Spouse name: Not on file   Number of children: 0   Years of education: Not on file   Highest education level: Not on file  Occupational History   Occupation: retired---collections  Tobacco Use   Smoking status: Never   Smokeless tobacco: Never   Tobacco comments:    Never smoke 08/15/22  Vaping Use   Vaping Use: Never used  Substance and Sexual Activity   Alcohol use: No    Alcohol/week: 0.0 standard drinks of alcohol   Drug use: No   Sexual activity: Never    Comment: [place]  Other Topics Concern   Not on file  Social History Narrative   Lives by herself    Social Determinants of Health   Financial Resource Strain: Medium Risk (07/31/2022)   Overall Financial Resource Strain (CARDIA)    Difficulty of Paying Living Expenses: Somewhat hard  Food Insecurity: No Food Insecurity (07/31/2022)   Hunger Vital Sign    Worried About Running Out of Food in the Last Year: Never true    Ran Out of Food in the Last Year: Never true  Transportation Needs: No Transportation Needs (07/31/2022)   PRAPARE - Hydrologist (Medical): No    Lack of Transportation (Non-Medical): No  Physical Activity: Inactive (07/31/2022)   Exercise Vital Sign    Days of Exercise per Week: 0 days    Minutes of Exercise per Session: 0 min  Stress: No Stress Concern Present (07/31/2022)   [place]    Feeling of Stress : Not at all  Social Connections: Moderately Isolated (07/31/2022)   Social Connection and Isolation Panel [NHANES]    Frequency of Communication with Friends and Family:  Once a week    Frequency of Social Gatherings with Friends and Family: Once a week    Attends Religious Services: More than 4 times per year    Active Member of [employer] or Organizations: Yes    Attends Archivist Meetings: More than 4 times per year    Marital Status: Never married  Intimate Partner Violence: Not At Risk (07/31/2022)   Humiliation, Afraid, Rape, and Kick questionnaire    Fear of Current or Ex-Partner: No    Emotionally Abused: No    Physically Abused: No    Sexually Abused: No     ROS- All systems are reviewed and negative except as per the HPI above.  Physical Exam: Vitals:   08/29/22 1356  BP: 108/60  Pulse: (!) 54  Weight: 102 kg  Height: '5\' 6"'$  (1.676 m)     GEN- The patient is a well appearing obese female, alert and oriented x 3 today.   HEENT-head normocephalic, atraumatic, sclera clear, conjunctiva pink, hearing intact, trachea midline. Lungs- Clear to ausculation bilaterally, normal work of breathing Heart- Regular rate and rhythm, no murmurs, rubs or gallops  GI- soft, NT, ND, + BS Extremities- no clubbing, cyanosis, or edema MS- no significant deformity or atrophy Skin- no rash or lesion Psych- euthymic mood, full affect Neuro- strength and sensation are intact   Wt Readings from Last 3 Encounters:  08/29/22 102 kg  08/23/22 103 kg  08/15/22 103.3 kg    EKG today demonstrates  SB, 1st degree AV block Vent. rate 54 BPM PR interval 376 ms QRS duration 92 ms QT/QTcB 448/424 ms  Echo 11/08/21 1. Left ventricular ejection fraction, by estimation, is 60 to 65%. The  left ventricle has normal function. The left ventricle has no regional  wall motion abnormalities. There is mild left ventricular hypertrophy of  the septal segment. Left ventricular diastolic parameters are consistent with Grade I diastolic dysfunction (impaired relaxation). There is the interventricular septum is flattened in systole and diastole, consistent with right  ventricular pressure and volume overload.   2. Right ventricular systolic function is mildly reduced. The right  ventricular size is moderately enlarged. There is moderately elevated  pulmonary artery systolic pressure. The estimated right ventricular  systolic pressure is 24.2 mmHg.   3. Right atrial size was moderately dilated.  4. The mitral valve is normal in structure. Mild mitral valve  regurgitation. No evidence of mitral stenosis. There is mild holosystolic  prolapse of the middle segment of the anterior leaflet of the mitral  valve.   5. Tricuspid valve regurgitation is moderate to severe.   6. The aortic valve is normal in structure. Aortic valve regurgitation is  not visualized. No aortic stenosis is present.   7. The inferior vena cava is dilated in size with <50% respiratory  variability, suggesting right atrial pressure of 15 mmHg.     Epic records are reviewed at length today  CHA2DS2-VASc Score = 4  The patient's score is based upon: CHF History: 1 HTN History: 0 Diabetes History: 0 Stroke History: 0 Vascular Disease History: 1 (aortic atherosclerosis) Age Score: 1 Gender Score: 1       ASSESSMENT AND PLAN: 1. Persistent atrial fibrillation  The patient's CHA2DS2-VASc score is 4, indicating a 4.8% annual risk of stroke.   S/p DCCV 08/23/22 She appears to be maintaining SR.  Her bradycardia and very long PR interval limit her AAD options. Will refer to EP to establish care/discuss options. She may eventually require pacing. Avoiding AV nodal agents.  Continue Eliquis 5 mg BID  2. Secondary Hypercoagulable State (ICD10:  D68.69) The patient is at significant risk for stroke/thromboembolism based upon her CHA2DS2-VASc Score of 4.  Continue Apixaban (Eliquis).   3. Obesity Body mass index is 36.28 kg/m. Lifestyle modification was discussed and encouraged including regular physical activity and weight reduction.  4. Snoring/witnessed apnea/suspected  OSA Sleep study previously deferred due to lack of insurance coverage.  Will refer for sleep study now.   5. Chronic diastolic CHF Enrolled in Alleviate HF Appears euvolemic today.   Follow up with Dr Sallyanne Kuster as scheduled and with EP to establish care.    [facility] [address] [place], [place] [address] [phone] 08/29/2022 3:25 PM

## 2022-09-08 ENCOUNTER — Other Ambulatory Visit: Payer: Self-pay | Admitting: Cardiovascular Disease

## 2022-09-09 NOTE — Telephone Encounter (Signed)
Prescription refill request for Eliquis received. Indication:afib Last office visit:10/23 Scr:0.7 Age: 71 Weight:102 kg  Prescription refilled

## 2022-09-19 ENCOUNTER — Encounter: Payer: Self-pay | Admitting: Cardiovascular Disease

## 2022-09-30 ENCOUNTER — Other Ambulatory Visit: Payer: Self-pay | Admitting: Cardiovascular Disease

## 2022-09-30 DIAGNOSIS — Z006 Encounter for examination for normal comparison and control in clinical research program: Secondary | ICD-10-CM

## 2022-10-15 VITALS — BP 115/59 | HR 58 | Resp 16 | Wt 224.0 lb

## 2022-10-15 DIAGNOSIS — Z006 Encounter for examination for normal comparison and control in clinical research program: Secondary | ICD-10-CM

## 2022-10-15 MED ORDER — FUROSEMIDE 20 MG PO TABS: ORAL_TABLET | ORAL | 3 refills | Status: DC

## 2022-10-15 MED ORDER — POTASSIUM CHLORIDE ER 10 MEQ PO TBCR: EXTENDED_RELEASE_TABLET | ORAL | 3 refills | Status: DC

## 2022-10-15 NOTE — Research (Signed)
Alleviate 12 Month Follow-up   Alleviate HF Research Study 6 Month Follow-up   No medication changes or adverse events to report at this time.   Labs drawn 6 MHWT completed EQ 5D 5L completed: See worksheet NYHA ll        Current Outpatient Medications:    Ascorbic Acid (VITAMIN C) 1000 MG tablet, Take 1,000 mg by mouth daily., Disp: , Rfl:    calcium carbonate (OS-CAL - DOSED IN MG OF ELEMENTAL CALCIUM) 1250 MG tablet, Take 1,250 mg by mouth daily with breakfast., Disp: , Rfl:    cholecalciferol (VITAMIN D) 1000 UNITS tablet, Take 1,000 Units by mouth daily., Disp: , Rfl:    ELIQUIS 5 MG TABS tablet, TAKE 1 TABLET BY MOUTH TWICE A DAY, Disp: 180 tablet, Rfl: 1   furosemide (LASIX) 20 MG tablet, As needed patient may take 60 MG Lasix PRN by mouth QD x 4 days as directed per Alleviate Research HF Study. PLEASE DO NOT REMOVE PRESCRIPTION, Disp: 90 tablet, Rfl: 3   Multiple Vitamin (MULTIVITAMIN WITH MINERALS) TABS tablet, Take 1 tablet by mouth in the morning. Alive, Disp: , Rfl:    potassium chloride (KLOR-CON) 10 MEQ tablet, Patient may take additional 30 mEq potassium BY MOUTH DAILY AS DIRECTED for Alleviate HF Research Study. PLEASE DO NOT REMOVE PRESCRIPTION, Disp: 90 tablet, Rfl: 3   vitamin A 10000 UNIT capsule, Take 10,000 Units by mouth daily., Disp: , Rfl:    vitamin B-12 (CYANOCOBALAMIN) 1000 MCG tablet, Take 1,000 mcg by mouth daily., Disp: , Rfl:    vitamin E 400 UNIT capsule, Take 400 Units by mouth daily., Disp: , Rfl:    zinc gluconate 50 MG tablet, Take 50 mg by mouth daily., Disp: , Rfl:    omeprazole (PRILOSEC) 40 MG capsule, TAKE 1 CAPSULE (40 MG TOTAL) BY MOUTH DAILY. (Patient not taking: Reported on 10/15/2022), Disp: 90 capsule, Rfl: 0

## 2022-10-17 ENCOUNTER — Other Ambulatory Visit: Payer: Self-pay

## 2022-10-17 LAB — BASIC METABOLIC PANEL
BUN/Creatinine Ratio: 16 (ref 12–28)
BUN: 13 mg/dL (ref 8–27)
CO2: 26 mmol/L (ref 20–29)
Calcium: 10.1 mg/dL (ref 8.7–10.3)
Chloride: 99 mmol/L (ref 96–106)
Creatinine, Ser: 0.82 mg/dL (ref 0.57–1.00)
Glucose: 93 mg/dL (ref 70–99)
Potassium: 4.2 mmol/L (ref 3.5–5.2)
Sodium: 140 mmol/L (ref 134–144)
eGFR: 76 mL/min/{1.73_m2} (ref >59)

## 2022-10-17 LAB — CBC
Hematocrit: 42.2 % (ref 34.0–46.6)
Hemoglobin: 14.3 g/dL (ref 11.1–15.9)
MCH: 31.3 pg (ref 26.6–33.0)
MCHC: 33.9 g/dL (ref 31.5–35.7)
MCV: 92 fL (ref 79–97)
Platelets: 134 10*3/uL — ABNORMAL LOW (ref 150–450)
RBC: 4.57 x10E6/uL (ref 3.77–5.28)
RDW: 12.8 % (ref 11.7–15.4)
WBC: 4.8 10*3/uL (ref 3.4–10.8)

## 2022-10-17 LAB — BRAIN NATRIURETIC PEPTIDE: BNP: 139.9 pg/mL — ABNORMAL HIGH (ref 0.0–100.0)

## 2022-10-17 MED ORDER — POTASSIUM CHLORIDE CRYS ER 20 MEQ PO TBCR: 20.0000 meq | EXTENDED_RELEASE_TABLET | Freq: Every day | ORAL | 3 refills | Status: DC

## 2022-10-17 MED ORDER — FUROSEMIDE 20 MG PO TABS: ORAL_TABLET | ORAL | 3 refills | Status: DC

## 2022-10-17 MED ORDER — POTASSIUM CHLORIDE ER 10 MEQ PO TBCR: EXTENDED_RELEASE_TABLET | ORAL | 3 refills | Status: DC

## 2022-10-17 MED ORDER — FUROSEMIDE 40 MG PO TABS: 40.0000 mg | ORAL_TABLET | Freq: Every day | ORAL | 3 refills | Status: DC

## 2022-10-17 NOTE — Addendum Note (Signed)
Addended by: Rubie Maid B on: 10/17/2022 08:42 AM   Modules accepted: Orders

## 2022-10-17 NOTE — Progress Notes (Signed)
Alleviate labs for review

## 2022-10-17 NOTE — Addendum Note (Signed)
Addended by: Rubie Maid B on: 10/17/2022 08:53 AM   Modules accepted: Orders

## 2022-10-30 ENCOUNTER — Institutional Professional Consult (permissible substitution): Payer: 59 | Admitting: Cardiovascular Disease

## 2022-11-11 ENCOUNTER — Ambulatory Visit: Payer: Medicaid Other | Admitting: Cardiovascular Disease

## 2022-11-11 ENCOUNTER — Ambulatory Visit: Payer: 59 | Admitting: Cardiovascular Disease

## 2022-11-18 ENCOUNTER — Institutional Professional Consult (permissible substitution): Payer: 59 | Admitting: Cardiovascular Disease

## 2022-11-22 ENCOUNTER — Ambulatory Visit (INDEPENDENT_AMBULATORY_CARE_PROVIDER_SITE_OTHER): Payer: 59 | Admitting: Internal Medicine

## 2022-11-22 ENCOUNTER — Encounter: Payer: Self-pay | Admitting: Internal Medicine

## 2022-11-22 VITALS — BP 122/70 | HR 58 | Temp 97.7°F | Resp 18 | Ht 66.0 in | Wt 235.4 lb

## 2022-11-22 DIAGNOSIS — R051 Acute cough: Secondary | ICD-10-CM | POA: Diagnosis not present

## 2022-11-22 NOTE — Progress Notes (Signed)
Subjective:    Patient ID: Brittney Walker, female    DOB: Dec 20, 1950, 72 y.o.   MRN: [id number]  DOS:  11/22/2022 Type of visit - description: Acute visit  Self  dx w/  the flu 2 weeks ago: + Subjective fever, malaise, aches and pains, runny nose, chest congestion, mild cough.  She is here because although better she still has some runny nose and a mild cough with a small amount of yellow sputum. Nasal discharge is clear. No further fevers.   Review of Systems See above   Past Medical History:  Diagnosis Date   Atrial fibrillation ([place]) 10/04/2021   Carotid bruit    w/ neg dopplers    CHF (congestive heart failure) ([place])    History of gastric ulcer 10/04/2021   Hyperthyroidism     s/p RAI 2004   Normocytic anemia    Persistent atrial fibrillation ([place])    a. Dx 07/2021; b. CHA2DS2VASc = 3-->Eliquis; c. 10/2021 s/p DCCV.   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   Vitamin D deficiency     Past Surgical History:  Procedure Laterality Date   ABDOMINAL HYSTERECTOMY  11/05/1995   TAH-LEIOMYOMATA-- no BSO   BIOPSY  12/25/2021   Procedure: BIOPSY;  Surgeon: Doran Stabler, MD;  Location: [facility];  Service: Gastroenterology;;   BIOPSY THYROID      bx of largest nodule 2005, benign   BREAST SURGERY     x's 2--fibrocystic dz   CARDIOVERSION N/A 10/10/2021   Procedure: CARDIOVERSION;  Surgeon: Sanda Klein, MD;  Location: [facility];  Service: Cardiovascular;  Laterality: N/A;   CARDIOVERSION N/A 08/23/2022   Procedure: CARDIOVERSION;  Surgeon: Elouise Munroe, MD;  Location: [place];  Service: Cardiovascular;  Laterality: N/A;   CATARACT EXTRACTION, BILATERAL Bilateral    COLONOSCOPY WITH PROPOFOL N/A 12/25/2021   Procedure: COLONOSCOPY WITH PROPOFOL;  Surgeon: Doran Stabler, MD;  [facility];  Service: Gastroenterology;  Laterality: N/A;   ESOPHAGOGASTRODUODENOSCOPY (EGD) WITH PROPOFOL N/A 10/12/2021   Procedure:  ESOPHAGOGASTRODUODENOSCOPY (EGD) WITH PROPOFOL;  Surgeon: Doran Stabler, MD;  Location: [place];  Service: Gastroenterology;  Laterality: N/A;   ESOPHAGOGASTRODUODENOSCOPY (EGD) WITH PROPOFOL N/A 12/25/2021   Procedure: ESOPHAGOGASTRODUODENOSCOPY (EGD) WITH PROPOFOL;  Surgeon: Doran Stabler, MD;  [facility];  Service: Gastroenterology;  Laterality: N/A;   POLYPECTOMY  12/25/2021   Procedure: POLYPECTOMY;  Surgeon: Doran Stabler, MD;  [facility];  Service: Gastroenterology;;   TONSILLECTOMY      Current Outpatient Medications  Medication Instructions   calcium carbonate (OS-CAL - DOSED IN MG OF ELEMENTAL CALCIUM) 1,250 mg, Oral, Daily with breakfast   cholecalciferol (VITAMIN D) 1,000 Units, Daily   cyanocobalamin (VITAMIN B12) 1,000 mcg, Oral, Daily   Eliquis 5 mg, Oral, 2 times daily   furosemide (LASIX) 20 MG tablet As needed patient may take 40 MG Lasix PRN by mouth QD x 4 days as directed per [facility] HF Study.<BR>PLEASE DO NOT REMOVE PRESCRIPTION<BR>PRN ONLY IF DIRECTED   furosemide (LASIX) 40 mg, Oral, Daily   Multiple Vitamin (MULTIVITAMIN WITH MINERALS) TABS tablet 1 tablet, Oral, Every morning, Alive   omeprazole (PRILOSEC) 40 mg, Oral, Daily   potassium chloride (KLOR-CON) 10 MEQ tablet Patient may take additional 20 mEq potassium BY MOUTH DAILY AS DIRECTED for Alleviate HF Research Study.<BR>PLEASE DO NOT REMOVE PRESCRIPTION<BR>PRN ONLY IF DIRECTED BY STUDY TEAM   potassium chloride SA (KLOR-CON M) 20 MEQ tablet 20  mEq, Oral, Daily   vitamin A 10,000 Units, Daily   vitamin C 1,000 mg, Oral, Daily   vitamin E 400 Units, Daily   zinc gluconate 50 mg, Oral, Daily       Objective:   Physical Exam BP 122/70   Pulse (!) 58   Temp 97.7 F (36.5 C) (Oral)   Resp 18   Ht '5\' 6"'$  (1.676 m)   Wt 235 lb 6 oz (106.8 kg)   LMP 11/05/1995   SpO2 97%   BMI 37.99 kg/m  General:   Well developed, NAD, BMI noted. HEENT:   Normocephalic . Face symmetric, atraumatic. TMs: Normal.  Throat symmetric not red.  Nose slightly congested. Lungs:  CTA B Normal respiratory effort, no intercostal retractions, no accessory muscle use. Heart: Bradycardic, regular? Lower extremities: no pretibial edema bilaterally  Skin: Not pale. Not jaundice Neurologic:  alert & oriented X3.  Speech normal, gait appropriate for age and unassisted Psych--  Cognition and judgment appear intact.  Cooperative with normal attention span and concentration.  Behavior appropriate. No anxious or depressed appearing.      Assessment     Assessment Gout Thyroid disease,  -RAI 2004 - Bx 2005 neg -[place] 10-2014-- new nodule, Bx unsuccessful 12-2014 Morbid obesity  H/o  VIN I,  2000 Vitamin D deficiency Carotid bruit w/ negative Dopplers A- Fibrillation,Dx 07-2021, cardioverted 10/11/2021, 08/23/2022. CHF Dx 07-2021 Acute anemia due to H. pylori + gastric ulcer 10/2021   PLAN Cough: Had a viral illness 2 weeks ago, now has somewhat persistent runny nose and cough.  Exam is benign. Plan: Symptomatic treated with Flonase and Astepro.  Avoid decongestants.  Call if not gradually better. Atrial fibrillation: Saw cardiology 08/29/2022 shortly after cardioversion at the time she was maintaining NSR, was referred to electrophysiology and to have a sleep study (pending). On today exam she is slt bradycardic .

## 2022-11-22 NOTE — Patient Instructions (Signed)
For cough:  Take Mucinex DM or Robitussin-DM OTC.  Follow the instructions in the box.   For nasal congestion: -Use over-the-counter Flonase: 2 nasal sprays on each side of the nose in the morning until you feel better  -Use OTC Astepro 2 nasal sprays on each side of the nose twice daily until better  Avoid decongestants such as  Pseudoephedrine or phenylephrine      Call if not gradually better over the next  10 days

## 2022-11-24 NOTE — Assessment & Plan Note (Signed)
Cough: Had a viral illness 2 weeks ago, now has somewhat persistent runny nose and cough.  Exam is benign. Plan: Symptomatic treated with Flonase and Astepro.  Avoid decongestants.  Call if not gradually better. Atrial fibrillation: Saw cardiology 08/29/2022 shortly after cardioversion at the time she was maintaining NSR, was referred to electrophysiology and to have a sleep study (pending). On today exam she is slt bradycardic .

## 2022-12-10 ENCOUNTER — Institutional Professional Consult (permissible substitution): Payer: 59 | Admitting: Cardiovascular Disease

## 2022-12-18 ENCOUNTER — Encounter: Payer: Self-pay | Admitting: Cardiovascular Disease

## 2023-01-02 ENCOUNTER — Telehealth: Payer: Self-pay

## 2023-01-02 NOTE — Telephone Encounter (Signed)
LVM for patient to return my call to reschedule upcoming appt with Dr. Sallyanne Kuster on 3/15 @ 8:20 am to 01/08/23.

## 2023-01-08 ENCOUNTER — Telehealth: Payer: Self-pay | Admitting: Emergency Medicine

## 2023-01-08 NOTE — Progress Notes (Unsigned)
Electrophysiology Office Note:    Date:  01/09/2023   ID:  Brittney Walker, DOB Dec 23, 1950, MRN [id number]  PCP:  Brittney Branch, MD   [facility] Cardiologist:  Brittney Klein, MD Electrophysiologist:  Brittney Quitter, MD     Referring MD: Brittney Branch, MD   History of Present Illness:    Brittney Walker is a 72 y.o. female with a hx listed below, significant for radio iodide ablation for thyroid disease, AF referred for arrhythmia management. She presents 30 minutes late for her appointment.  She was diagnosed with atrial fibrillation in September 2022. She underwent DCCV in December 2022.   TTE in December 2022 showed RV pressure and volume overload with moderate-severe pulmonary hypertension. The RA was severely dilated, the LA mildly dilated. She was enrolled in Alleviate-HF and an ILR placed. In follow-up with Dr. Sallyanne Walker in Oct 2023, she was noticed to be in AF with a 20% burden. She again had a cardioversion in October 2023.  Her precardioversion ECG shows very fine A-fib with a junctional rhythm at 41 bpm.  She reports that she felt much better after cardioversions. She does not sleep well and oftentimes naps between 8 AM and 11 AM.  She does not have palpitations.  We have her loop recorder interrogation.  Her A-fib burden over the past month is about 3.1%.  She has had multiple pauses throughout the daytime lasting no more than 2 or 3 seconds, due to Mobitz 2 AV block.  Past Medical History:  Diagnosis Date   Atrial fibrillation ([place]) 10/04/2021   Carotid bruit    w/ neg dopplers    CHF (congestive heart failure) ([place])    History of gastric ulcer 10/04/2021   Hyperthyroidism     s/p RAI 2004   Normocytic anemia    Persistent atrial fibrillation ([place])    a. Dx 07/2021; b. CHA2DS2VASc = 3-->Eliquis; c. 10/2021 s/p DCCV.   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   Vitamin D deficiency     Past Surgical History:  Procedure Laterality  Date   ABDOMINAL HYSTERECTOMY  11/05/1995   TAH-LEIOMYOMATA-- no BSO   BIOPSY  12/25/2021   Procedure: BIOPSY;  Surgeon: Brittney Stabler, MD;  [facility];  Service: Gastroenterology;;   BIOPSY THYROID      bx of largest nodule 2005, benign   BREAST SURGERY     x's 2--fibrocystic dz   CARDIOVERSION N/A 10/10/2021   Procedure: CARDIOVERSION;  Surgeon: Brittney Klein, MD;  Location: [facility];  Service: Cardiovascular;  Laterality: N/A;   CARDIOVERSION N/A 08/23/2022   Procedure: CARDIOVERSION;  Surgeon: Brittney Munroe, MD;  Location: [facility];  Service: Cardiovascular;  Laterality: N/A;   CATARACT EXTRACTION, BILATERAL Bilateral    COLONOSCOPY WITH PROPOFOL N/A 12/25/2021   Procedure: COLONOSCOPY WITH PROPOFOL;  Surgeon: Brittney Stabler, MD;  [facility];  Service: Gastroenterology;  Laterality: N/A;   ESOPHAGOGASTRODUODENOSCOPY (EGD) WITH PROPOFOL N/A 10/12/2021   Procedure: ESOPHAGOGASTRODUODENOSCOPY (EGD) WITH PROPOFOL;  Surgeon: Brittney Stabler, MD;  Location: [facility];  Service: Gastroenterology;  Laterality: N/A;   ESOPHAGOGASTRODUODENOSCOPY (EGD) WITH PROPOFOL N/A 12/25/2021   Procedure: ESOPHAGOGASTRODUODENOSCOPY (EGD) WITH PROPOFOL;  Surgeon: Brittney Stabler, MD;  [facility];  Service: Gastroenterology;  Laterality: N/A;   POLYPECTOMY  12/25/2021   Procedure: POLYPECTOMY;  Surgeon: Brittney Stabler, MD;  [facility];  Service: Gastroenterology;;   TONSILLECTOMY  Current Medications: Current Meds  Medication Sig   Ascorbic Acid (VITAMIN C) 1000 MG tablet Take 1,000 mg by mouth daily.   calcium carbonate (OS-CAL - DOSED IN MG OF ELEMENTAL CALCIUM) 1250 MG tablet Take 1,250 mg by mouth daily with breakfast.   cholecalciferol (VITAMIN D) 1000 UNITS tablet Take 1,000 Units by mouth daily.   ELIQUIS 5 MG TABS tablet TAKE 1 TABLET BY MOUTH TWICE A DAY   furosemide (LASIX) 20 MG tablet As needed patient may  take 40 MG Lasix PRN by mouth QD x 4 days as directed per [facility] Research HF Study. PLEASE DO NOT REMOVE PRESCRIPTION PRN ONLY IF DIRECTED   furosemide (LASIX) 40 MG tablet Take 1 tablet (40 mg total) by mouth daily.   Multiple Vitamin (MULTIVITAMIN WITH MINERALS) TABS tablet Take 1 tablet by mouth in the morning. Alive   potassium chloride SA (KLOR-CON M) 20 MEQ tablet Take 1 tablet (20 mEq total) by mouth daily.   vitamin A 10000 UNIT capsule Take 10,000 Units by mouth daily.   vitamin B-12 (CYANOCOBALAMIN) 1000 MCG tablet Take 1,000 mcg by mouth daily.   vitamin E 400 UNIT capsule Take 400 Units by mouth daily.   zinc gluconate 50 MG tablet Take 50 mg by mouth daily.     Allergies:   Patient has no known allergies.   Social and Family History: Reviewed in Epic  ROS:   Please see the history of present illness.    All other systems reviewed and are negative.  EKGs/Labs/Other Studies Reviewed Today:    Cardiac Studies & Procedures       ECHOCARDIOGRAM  ECHOCARDIOGRAM COMPLETE 11/08/2021   1. Left ventricular ejection fraction, by estimation, is 60 to 65%. The left ventricle has normal function. The left ventricle has no regional wall motion abnormalities. There is mild left ventricular hypertrophy of the septal segment. Left ventricular diastolic parameters are consistent with Grade I diastolic dysfunction (impaired relaxation). There is the interventricular septum is flattened in systole and diastole, consistent with right ventricular pressure and volume overload. 2. Right ventricular systolic function is mildly reduced. The right ventricular size is moderately enlarged. There is moderately elevated pulmonary artery systolic pressure. The estimated right ventricular systolic pressure is 99991111 mmHg. 3. Right atrial size was moderately dilated. 4. The mitral valve is normal in structure. Mild mitral valve regurgitation. No evidence of mitral stenosis. There is mild holosystolic  prolapse of the middle segment of the anterior leaflet of the mitral valve. 5. Tricuspid valve regurgitation is moderate to severe. 6. The aortic valve is normal in structure. Aortic valve regurgitation is not visualized. No aortic stenosis is present. 7. The inferior vena cava is dilated in size with <50% respiratory variability, suggesting right atrial pressure of 15 mmHg.  Echocardiogram 10/11/2021 Normal EF, RV has volume and pressure overload. RA is severely enlarged. Severe TR             EKG:  Last EKG results: today sinus rhythm with Mobitz I AV block   Recent Labs: 07/31/2022: ALT 20; TSH 3.06 10/15/2022: BNP 139.9; BUN 13; Creatinine, Ser 0.82; Hemoglobin 14.3; Platelets 134; Potassium 4.2; Sodium 140     Physical Exam:    VS:  BP 114/66   Pulse (!) 48   Ht '5\' 6"'$  (1.676 m)   Wt 227 lb (103 kg)   LMP 11/05/1995   SpO2 98%   BMI 36.64 kg/m     Wt Readings from Last 3 Encounters:  01/09/23 227  lb (103 kg)  11/22/22 235 lb 6 oz (106.8 kg)  10/15/22 224 lb (101.6 kg)     GEN: Well nourished, well developed in no acute distress CARDIAC: RRR, no murmurs, rubs, gallops RESPIRATORY:  Normal work of breathing MUSCULOSKELETAL: no edema    ASSESSMENT & PLAN:    Persistent atrial fibrillation: I'm concerned that she would have non-pulmonary triggers since much of her cardiac pathology is right-sided. Tikosyn would be my first choice for rhythm control.  Mobitz I second degree AV block: she appears to be largely asymptomatic. The primary issue appears to be AF. Will continue to monitor with ILR. I think there is a strong likelihood that she will need a pacemaker Secondary hypercoagulable state: continue apixaban Obesity Suspected sleep apnea: Chronic CHFpEF: participating in Alleviate HF        Medication Adjustments/Labs and Tests Ordered: Current medicines are reviewed at length with the patient today.  Concerns regarding medicines are outlined above.  No orders  of the defined types were placed in this encounter.  No orders of the defined types were placed in this encounter.    Signed, Brittney Quitter, MD  01/09/2023 9:36 AM    [place]

## 2023-01-08 NOTE — Telephone Encounter (Signed)
Spoke with pt's friend Rogelia Boga- Told of pt having an appt 01/17/23- provider will not be in office that day- need to reschedule. We have openings today at 1320 and 1420-1600. She will try to get in touch with the patient to see if she can come in today.

## 2023-01-09 ENCOUNTER — Encounter: Payer: Self-pay | Admitting: Cardiovascular Disease

## 2023-01-09 ENCOUNTER — Ambulatory Visit: Payer: 59 | Attending: Cardiovascular Disease | Admitting: Cardiovascular Disease

## 2023-01-09 VITALS — BP 114/66 | HR 48 | Ht 66.0 in | Wt 227.0 lb

## 2023-01-09 DIAGNOSIS — I441 Atrioventricular block, second degree: Secondary | ICD-10-CM

## 2023-01-09 DIAGNOSIS — I4819 Other persistent atrial fibrillation: Secondary | ICD-10-CM | POA: Diagnosis not present

## 2023-01-09 DIAGNOSIS — I480 Paroxysmal atrial fibrillation: Secondary | ICD-10-CM

## 2023-01-09 NOTE — Patient Instructions (Signed)
Medication Instructions:  Your physician recommends that you continue on your current medications as directed. Please refer to the Current Medication list given to you today.  *If you need a refill on your cardiac medications before your next appointment, please call your pharmacy*   Lab Work: None ordered   Testing/Procedures: None ordered   Follow-Up: At [facility], you and your health needs are our priority.  As part of our continuing mission to provide you with exceptional heart care, we have created designated Provider Care Teams.  These Care Teams include your primary Cardiologist (physician) and Advanced Practice Providers (APPs -  Physician Assistants and Nurse Practitioners) who all work together to provide you with the care you need, when you need it.  We recommend signing up for the patient portal called "MyChart".  Sign up information is provided on this After Visit Summary.  MyChart is used to connect with patients for Virtual Visits (Telemedicine).  Patients are able to view lab/test results, encounter notes, upcoming appointments, etc.  Non-urgent messages can be sent to your provider as well.   To learn more about what you can do with MyChart, go to [email or web address].    Your next appointment:   To be  determined  The format for your next appointment:   In Person  Provider:   Doralee Albino, MD    Thank you for choosing [facility]!!   [phone]  Other Instructions  Dofetilide (Tikosyn) Admission  Prior to day of admission: Check with drug insurance company for cost of drug to ensure affordability --- Dofetilide 500 mcg twice a day.  GoodRx is an option if insurance copay is unaffordable.  A pharmacist will review all your medications for potential interactions with Tikosyn. If any medication changes are needed prior to admission we will be in touch with you.  If any new medications are started AFTER your admission date is set with [facility] [phone]). Please notify our office immediately so your medication list can be updated and reviewed by our pharmacist again.  No Benadryl is allowed 3 days prior to admission.  Please ensure no missed doses of your anticoagulation (blood thinner) for 3 weeks prior to admission. If a dose is missed please notify our office immediately.  Tikosyn initiation requires a 3 night/4 day hospital stay with constant telemetry monitoring. You will have an EKG after each dose of Tikosyn as well as daily lab draws. On day of admission: [facility] office visit on the morning of admission is needed for preliminary labs/ekg.  You may bring personal belongings/clothing with you to the hospital. Please leave your suitcase in the car until you arrive in admissions.  Time of admission is dependent on bed availability in the hospital. In some instances, you will be sent home until bed is available. Rarely admission can be delayed to the following day if hospital census prevents available beds.  If the drug does not convert you to normal rhythm a cardioversion after the 4th dose of Tikosyn.  Questions please call our office at [phone]

## 2023-01-17 ENCOUNTER — Telehealth: Payer: Self-pay | Admitting: Cardiovascular Disease

## 2023-01-17 ENCOUNTER — Ambulatory Visit: Payer: 59 | Admitting: Cardiovascular Disease

## 2023-01-17 NOTE — Telephone Encounter (Signed)
Left message for patient to callback.  Attempted phone number [phone]  twice, unable to complete call both times.

## 2023-01-17 NOTE — Telephone Encounter (Signed)
Patient called back go say that her insurance does cover this medication dofetilide. Please advise

## 2023-01-23 NOTE — Telephone Encounter (Signed)
Called and spoke with patient's friend, Thea Silversmith (OK per DPR). She states patient does not answer her phone and likely does not listen to messages. Provided phone number [phone] for patient.    I did attempt to call number listed about and received message stating patient is not accepting calls right now, only text messages.  Gave message to [facility] to share with patient: if insurance does cover Tikosyn, call [facility] at [phone] to get this setup.  Hallie verbalized understanding and states she will give message to patient.

## 2023-01-24 NOTE — Telephone Encounter (Signed)
Received call from patient.  Patient states her insurance does cover Tikosyn. She states she would like to discuss with Dr. Sallyanne Kuster at appt on 01/31/23.  Informed patient if she wishes to proceed with starting medication as recommended by Dr. Myles Gip to call the [facility] to arrange Tikosyn load. Provided number for [facility]: [phone].  Patient verbalized understanding, no other needs voiced at this time.

## 2023-01-29 ENCOUNTER — Ambulatory Visit: Payer: Medicaid Other | Admitting: Internal Medicine

## 2023-01-29 ENCOUNTER — Encounter: Payer: Self-pay | Admitting: Internal Medicine

## 2023-01-31 ENCOUNTER — Encounter: Payer: Self-pay | Admitting: Cardiovascular Disease

## 2023-01-31 ENCOUNTER — Ambulatory Visit: Payer: 59 | Attending: Cardiovascular Disease | Admitting: Cardiovascular Disease

## 2023-01-31 VITALS — BP 132/86 | HR 75 | Ht 66.0 in | Wt 230.6 lb

## 2023-01-31 DIAGNOSIS — R0683 Snoring: Secondary | ICD-10-CM

## 2023-01-31 DIAGNOSIS — I441 Atrioventricular block, second degree: Secondary | ICD-10-CM

## 2023-01-31 DIAGNOSIS — I4819 Other persistent atrial fibrillation: Secondary | ICD-10-CM

## 2023-01-31 DIAGNOSIS — R400 Somnolence: Secondary | ICD-10-CM

## 2023-01-31 DIAGNOSIS — E669 Obesity, unspecified: Secondary | ICD-10-CM

## 2023-01-31 DIAGNOSIS — Z95818 Presence of other cardiac implants and grafts: Secondary | ICD-10-CM | POA: Diagnosis not present

## 2023-01-31 DIAGNOSIS — I50812 Chronic right heart failure: Secondary | ICD-10-CM

## 2023-01-31 DIAGNOSIS — Z862 Personal history of diseases of the blood and blood-forming organs and certain disorders involving the immune mechanism: Secondary | ICD-10-CM

## 2023-01-31 NOTE — Patient Instructions (Signed)
Medication Instructions:  No changes *If you need a refill on your cardiac medications before your next appointment, please call your pharmacy*  Testing/Procedures: Your physician has requested that you have an echocardiogram. Echocardiography is a painless test that uses sound waves to create images of your heart. It provides your doctor with information about the size and shape of your heart and how well your heart's chambers and valves are working. This procedure takes approximately one hour. There are no restrictions for this procedure. Please do NOT wear cologne, perfume, aftershave, or lotions (deodorant is allowed). Please arrive 15 minutes prior to your appointment time.   WatchPAT?  Is a FDA cleared portable home sleep study test that uses a watch and 3 points of contact to monitor 7 different channels, including your heart rate, oxygen saturations, body position, snoring, and chest motion.  The study is easy to use from the comfort of your own home and accurately detect sleep apnea.  Before bed, you attach the chest sensor, attached the sleep apnea bracelet to your nondominant hand, and attach the finger probe.  After the study, the raw data is downloaded from the watch and scored for apnea events.   For more information: [email or web address]  Patient Testing Instructions:  Do not put battery into the device until bedtime when you are ready to begin the test. Please call the support number if you need assistance after following the instructions below: 24 hour support line- [phone] or [facility] support at [phone] (option 2)  Download the [employer] One" app through the google play store or App Store  Be sure to turn on or enable access to bluetooth in sett[place] on your smartphone/ device  Make sure no other bluetooth devices are on and within the vicinity of your smartphone/ device and WatchPAT watch during testing.  Make sure to leave your smart phone/  device plugged in and charging all night.  When ready for bed:  Follow the instructions step by step in the WatchPAT One App to activate the testing device. For additional instructions, including video instruction, visit the WatchPAT One video on Youtube. You can search for [place] One within Youtube (video is 4 minutes and 18 seconds) or enter: https://youtube/watch?[email or web address] Please note: You will be prompted to enter a Pin to connect via bluetooth when starting the test. The PIN will be assigned to you when you receive the test.  The device is disposable, but it recommended that you retain the device until you receive a call letting you know the study has been received and the results have been interpreted.  We will let you know if the study did not transmit to [place] properly after the test is completed. You do not need to call us to confirm the receipt of the test.  Please complete the test within 48 hours of receiving PIN.   Frequently Asked Questions:  What is Watch [place] one?  A single use fully disposable home sleep apnea testing device and will not need to be returned after completion.  What are the requirements to use WatchPAT one?  The be able to have a successful watchpat one sleep study, you should have your Watch pat one device, your smart phone, watch pat one app, your PIN number and Internet access What type of phone do I need?  You should have a smart phone that uses Android 5.1 and above or any Iphone with IOS 10 and above How can I download the WatchPAT one app?  Based on your  device type search for WatchPAT one app either in google play for android devices or APP store for Iphone's Where will I get my PIN for the study?  Your PIN will be provided by your physician's office. It is used for authentication and if you lose/forget your PIN, please reach out to your providers office.  I do not have Internet at home. Can I do WatchPAT one study?  WatchPAT One needs Internet connection  throughout the night to be able to transmit the sleep data. You can use your home/local internet or your cellular's data package. However, it is always recommended to use home/local Internet. It is estimated that between 20MB-30MB will be used with each study.However, the application will be looking for 80MB space in the phone to start the study.  What happens if I lose internet or bluetooth connection?  During the internet disconnection, your phone will not be able to transmit the sleep data. All the data, will be stored in your phone. As soon as the internet connection is back on, the phone will being sending the sleep data. During the bluetooth disconnection, WatchPAT one will not be able to to send the sleep data to your phone. Data will be kept in the [facility] one until two devices have bluetooth connection back on. As soon as the connection is back on, WatchPAT one will send the sleep data to the phone.  How long do I need to wear the WatchPAT one?  After you start the study, you should wear the device at least 6 hours.  How far should I keep my phone from the device?  During the night, your phone should be within 15 feet.  What happens if I leave the room for restroom or other reasons?  Leaving the room for any reason will not cause any problem. As soon as your get back to the room, both devices will reconnect and will continue to send the sleep data. Can I use my phone during the sleep study?  Yes, you can use your phone as usual during the study. But it is recommended to put your watchpat one on when you are ready to go to bed.  How will I get my study results?  A soon as you completed your study, your sleep data will be sent to the provider. They will then share the results with you when they are ready.     Follow-Up: At [facility], you and your health needs are our priority.  As part of our continuing mission to provide you with exceptional heart care, we have created  designated Provider Care Teams.  These Care Teams include your primary Cardiologist (physician) and Advanced Practice Providers (APPs -  Physician Assistants and Nurse Practitioners) who all work together to provide you with the care you need, when you need it.  We recommend signing up for the patient portal called "MyChart".  Sign up information is provided on this After Visit Summary.  MyChart is used to connect with patients for Virtual Visits (Telemedicine).  Patients are able to view lab/test results, encounter notes, upcoming appointments, etc.  Non-urgent messages can be sent to your provider as well.   To learn more about what you can do with MyChart, go to [email or web address].    Your next appointment:   1 year(s)  Provider:   Sanda Klein, MD

## 2023-01-31 NOTE — Progress Notes (Signed)
Cardiology Office Note:    Date:  01/31/2023   ID:  Brittney Walker, DOB 01/16/1951, MRN [id number]  PCP:  Colon Branch, MD   [facility] Providers Cardiologist:  Sanda Klein, MD Electrophysiologist:  Melida Quitter, MD     Referring MD: Colon Branch, MD   Chief Complaint  Patient presents with   Atrial Fibrillation    History of Present Illness:    Brittney Walker is a 72 y.o. female with a hx of persistent atrial fibrillation for which she underwent electrical cardioversion on 10/11/2021.  Following the cardioversion she was noted to be severely anemic and underwent upper endoscopy that showed a large gastric ulcer, H. pylori positive.  Her echocardiogram showed dilated right heart chambers without evidence of left to right shunt, with mild-moderate reduced right ventricular systolic function, severely dilated right atrium and severe tricuspid regurgitation.  Labs were consistent with iron deficiency anemia.  She had recurrent persistent atrial fibrillation with slow ventricular response last October and underwent cardioversion 08/23/2022.  She reported substantial improvement in energy level after return to sinus rhythm.  She is referred to EP clinic and saw Dr. Myles Gip 01/09/2023.  He pointed out that she is likely to have nonpulmonary vein triggers related to right heart disease and recommended dofetilide for rhythm control should we choose to follow that path.  She has evidence of second-degree Mobitz type I AV block and will likely eventually need a pacemaker.  Paradoxically, she seems to have slower rates when she has atrial fibrillation than when she is in sinus rhythm.  She feels well and has no cardiovascular complaints.The patient specifically denies any chest pain at rest exertion, dyspnea at rest or with exertion, orthopnea, paroxysmal nocturnal dyspnea, syncope, palpitations, focal neurological deficits, intermittent claudication, lower extremity edema, unexplained  weight gain, cough, hemoptysis or wheezing.  She is aware that she snores, but has never been told that she has apneic events.  She lives by herself.  She reports that she stays up all night and sleeps during the day.  She does not seem to have significant hypersomnolence.  She can watch a movie without falling asleep.  She denies unexpected bouts of sleep.  He has never had a sleep study.  Loop recorder interrogation today continues to show relatively frequent episodes of pauses lasting 3-4 seconds, all of the available electrograms suggest Mobitz type I second-degree AV block.  These occur during daytime hours, not clear if they are occurring while she is asleep since she sleeps during the day.  The device has shown a remarkable reduction in the burden of atrial fibrillation which is only 3%, with many of the episodes actually being irregular rhythm due to second-degree AV block Mobitz type I.  There is only 1 convincing episode of atrial fibrillation that lasted for about an hour and 6 minutes.  There are no episodes of rapid ventricular response.  In June 2023 she also had lower extremity arterial Dopplers (ordered by Dr. Ila Mcgill, her podiatrist, for pain and numbness in her legs) with no evidence of arterial obstruction.  Past Medical History:  Diagnosis Date   Atrial fibrillation ([place]) 10/04/2021   Carotid bruit    w/ neg dopplers    CHF (congestive heart failure) ([place])    History of gastric ulcer 10/04/2021   Hyperthyroidism     s/p RAI 2004   Normocytic anemia    Persistent atrial fibrillation ([place])    a. Dx 07/2021; b. CHA2DS2VASc = 3-->Eliquis;  c. 10/2021 s/p DCCV.   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   Vitamin D deficiency     Past Surgical History:  Procedure Laterality Date   ABDOMINAL HYSTERECTOMY  11/05/1995   TAH-LEIOMYOMATA-- no BSO   BIOPSY  12/25/2021   Procedure: BIOPSY;  Surgeon: Doran Stabler, MD;  Location: [facility];  Service:  Gastroenterology;;   BIOPSY THYROID      bx of largest nodule 2005, benign   BREAST SURGERY     x's 2--fibrocystic dz   CARDIOVERSION N/A 10/10/2021   Procedure: CARDIOVERSION;  Surgeon: Sanda Klein, MD;  Location: [facility];  Service: Cardiovascular;  Laterality: N/A;   CARDIOVERSION N/A 08/23/2022   Procedure: CARDIOVERSION;  Surgeon: Elouise Munroe, MD;  Location: [place];  Service: Cardiovascular;  Laterality: N/A;   CATARACT EXTRACTION, BILATERAL Bilateral    COLONOSCOPY WITH PROPOFOL N/A 12/25/2021   Procedure: COLONOSCOPY WITH PROPOFOL;  Surgeon: Doran Stabler, MD;  Location: [facility];  Service: Gastroenterology;  Laterality: N/A;   ESOPHAGOGASTRODUODENOSCOPY (EGD) WITH PROPOFOL N/A 10/12/2021   Procedure: ESOPHAGOGASTRODUODENOSCOPY (EGD) WITH PROPOFOL;  Surgeon: Doran Stabler, MD;  Location: [facility];  Service: Gastroenterology;  Laterality: N/A;   ESOPHAGOGASTRODUODENOSCOPY (EGD) WITH PROPOFOL N/A 12/25/2021   Procedure: ESOPHAGOGASTRODUODENOSCOPY (EGD) WITH PROPOFOL;  Surgeon: Doran Stabler, MD;  Location: [facility];  Service: Gastroenterology;  Laterality: N/A;   POLYPECTOMY  12/25/2021   Procedure: POLYPECTOMY;  Surgeon: Doran Stabler, MD;  Location: [facility];  Service: Gastroenterology;;   TONSILLECTOMY      Current Medications: Current Meds  Medication Sig   Ascorbic Acid (VITAMIN C) 1000 MG tablet Take 1,000 mg by mouth daily.   calcium carbonate (OS-CAL - DOSED IN MG OF ELEMENTAL CALCIUM) 1250 MG tablet Take 1,250 mg by mouth daily with breakfast.   cholecalciferol (VITAMIN D) 1000 UNITS tablet Take 1,000 Units by mouth daily.   ELIQUIS 5 MG TABS tablet TAKE 1 TABLET BY MOUTH TWICE A DAY   furosemide (LASIX) 20 MG tablet As needed patient may take 40 MG Lasix PRN by mouth QD x 4 days as directed per Alleviate Research HF Study. PLEASE DO NOT REMOVE PRESCRIPTION PRN ONLY IF DIRECTED   Multiple Vitamin (MULTIVITAMIN WITH  MINERALS) TABS tablet Take 1 tablet by mouth in the morning. Alive   omeprazole (PRILOSEC) 40 MG capsule TAKE 1 CAPSULE (40 MG TOTAL) BY MOUTH DAILY.   potassium chloride (KLOR-CON) 10 MEQ tablet Patient may take additional 20 mEq potassium BY MOUTH DAILY AS DIRECTED for Alleviate HF Research Study. PLEASE DO NOT REMOVE PRESCRIPTION PRN ONLY IF DIRECTED BY STUDY TEAM   potassium chloride SA (KLOR-CON M) 20 MEQ tablet Take 1 tablet (20 mEq total) by mouth daily.   vitamin A 10000 UNIT capsule Take 10,000 Units by mouth daily.   vitamin B-12 (CYANOCOBALAMIN) 1000 MCG tablet Take 1,000 mcg by mouth daily.   vitamin E 400 UNIT capsule Take 400 Units by mouth daily.   zinc gluconate 50 MG tablet Take 50 mg by mouth daily.     Allergies:   Patient has no known allergies.   Social History   Socioeconomic History   Marital status: Single    Spouse name: Not on file   Number of children: 0   Years of education: Not on file   Highest education level: Not on file  Occupational History   Occupation: retired---collections  Tobacco Use   Smoking status: Never   Smokeless  tobacco: Never   Tobacco comments:    Never smoke 08/15/22  Vaping Use   Vaping Use: Never used  Substance and Sexual Activity   Alcohol use: No    Alcohol/week: 0.0 standard drinks of alcohol   Drug use: No   Sexual activity: Never    Comment: [place]  Other Topics Concern   Not on file  Social History Narrative   Lives by herself    Social Determinants of Health   Financial Resource Strain: Medium Risk (07/31/2022)   Overall Financial Resource Strain (CARDIA)    Difficulty of Paying Living Expenses: Somewhat hard  Food Insecurity: No Food Insecurity (07/31/2022)   Hunger Vital Sign    Worried About Running Out of Food in the Last Year: Never true    Ran Out of Food in the Last Year: Never true  Transportation Needs: No Transportation Needs (07/31/2022)   PRAPARE - Hydrologist  (Medical): No    Lack of Transportation (Non-Medical): No  Physical Activity: Inactive (07/31/2022)   Exercise Vital Sign    Days of Exercise per Week: 0 days    Minutes of Exercise per Session: 0 min  Stress: No Stress Concern Present (07/31/2022)   [place]    Feeling of Stress : Not at all  Social Connections: Moderately Isolated (07/31/2022)   Social Connection and Isolation Panel [NHANES]    Frequency of Communication with Friends and Family: Once a week    Frequency of Social Gatherings with Friends and Family: Once a week    Attends Religious Services: More than 4 times per year    Active Member of [employer] or Organizations: Yes    Attends Music therapist: More than 4 times per year    Marital Status: Never married     Family History: The patient's family history includes Breast cancer (age of onset: 34) in her sister; Heart attack in her father; Heart disease in her mother; Hypertension in her father and mother; Seizures in her father. There is no history of Colon cancer, Pancreatic cancer, Esophageal cancer, Prostate cancer, or Rectal cancer.  ROS:   Please see the history of present illness.     All other systems reviewed and are negative.  EKGs/Labs/Other Studies Reviewed:    The following studies were reviewed today: Echocardiogram 11/08/2021   1. Left ventricular ejection fraction, by estimation, is 60 to 65%. The  left ventricle has normal function. The left ventricle has no regional  wall motion abnormalities. There is mild left ventricular hypertrophy of  the septal segment. Left ventricular  diastolic parameters are consistent with Grade I diastolic dysfunction  (impaired relaxation). There is the interventricular septum is flattened  in systole and diastole, consistent with right ventricular pressure and  volume overload.   2. Right ventricular systolic function is mildly reduced. The  right  ventricular size is moderately enlarged. There is moderately elevated  pulmonary artery systolic pressure. The estimated right ventricular  systolic pressure is 99991111 mmHg.   3. Right atrial size was moderately dilated.   4. The mitral valve is normal in structure. Mild mitral valve  regurgitation. No evidence of mitral stenosis. There is mild holosystolic  prolapse of the middle segment of the anterior leaflet of the mitral  valve.   5. Tricuspid valve regurgitation is moderate to severe.   6. The aortic valve is normal in structure. Aortic valve regurgitation is  not visualized. No  aortic stenosis is present.   7. The inferior vena cava is dilated in size with <50% respiratory  variability, suggesting right atrial pressure of 15 mmHg.   Comparison(s): Prior images reviewed side by side.    EKG:  EKG is not ordered  today.  ECG from 01/09/2023 is personally reviewed and shows sinus rhythm with second-degree AV block with a very long Wenckebach cycle and remarkably long PR interval.     Recent Labs: 07/31/2022: ALT 20; TSH 3.06 10/15/2022: BNP 139.9; BUN 13; Creatinine, Ser 0.82; Hemoglobin 14.3; Platelets 134; Potassium 4.2; Sodium 140  Recent Lipid Panel    Component Value Date/Time   CHOL 180 12/21/2021 1105   TRIG 60.0 12/21/2021 1105   HDL 63.40 12/21/2021 1105   CHOLHDL 3 12/21/2021 1105   VLDL 12.0 12/21/2021 1105   LDLCALC 105 (H) 12/21/2021 1105   LDLDIRECT 141.1 05/01/2009 0947     Risk Assessment/Calculations:    CHA2DS2-VASc Score = 4  The patient's score is based upon: CHF History: 1 HTN History: 1 Diabetes History: 0 Stroke History: 0 Vascular Disease History: 0 Age Score: 1 Gender Score: 1       ASSESSMENT AND PLAN: Persistent Atrial Fibrillation (ICD10:  I48.19) The patient's CHA2DS2-VASc score is 4, indicating a 4.8% annual risk of stroke.    Secondary Hypercoagulable State (ICD10:  D68.69) The patient is at significant risk for  stroke/thromboembolism based upon her CHA2DS2-VASc Score of 4.  Start Apixaban (Eliquis).    Signed,  Sanda Klein, MD    01/31/2023 3:04 PM     Physical Exam:    VS:  BP 132/86   Pulse 75   Ht 5\' 6"  (1.676 m)   Wt 230 lb 9.6 oz (104.6 kg)   LMP 11/05/1995   SpO2 98%   BMI 37.22 kg/m     Wt Readings from Last 3 Encounters:  01/31/23 230 lb 9.6 oz (104.6 kg)  01/09/23 227 lb (103 kg)  11/22/22 235 lb 6 oz (106.8 kg)      General: Alert, oriented x3, no distress, obese Head: no evidence of trauma, PERRL, EOMI, no exophtalmos or lid lag, no myxedema, no xanthelasma; normal ears, nose and oropharynx Neck: normal jugular venous pulsations and no hepatojugular reflux; brisk carotid pulses without delay and no carotid bruits Chest: clear to auscultation, no signs of consolidation by percussion or palpation, normal fremitus, symmetrical and full respiratory excursions Cardiovascular: normal position and quality of the apical impulse, irregular rhythm, normal first and second heart sounds, no murmurs, rubs or gallops Abdomen: no tenderness or distention, no masses by palpation, no abnormal pulsatility or arterial bruits, normal bowel sounds, no hepatosplenomegaly Extremities: no clubbing, cyanosis or edema; 2+ radial, ulnar and brachial pulses bilaterally; 2+ right femoral, posterior tibial and dorsalis pedis pulses; 2+ left femoral, posterior tibial and dorsalis pedis pulses; no subclavian or femoral bruits Neurological: grossly nonfocal Psych: Normal mood and affect   ASSESSMENT:    1. Persistent atrial fibrillation ([place])   2. Second degree AV block, Mobitz type I   3. Chronic right-sided heart failure (HCC)   4. Status post placement of implantable loop recorder   5. History of anemia   6. Daytime somnolence   7. Snoring   8. Mild obesity      PLAN:    In order of problems listed above:  Persistent atrial fibrillation: She is maintaining normal sinus rhythm following  her cardioversion and has a remarkably low burden of atrial fibrillation now down to  about 3%.  Tends to have a slower rate in atrial fibrillation that she does in sinus rhythm.  She is not on any rate control medications due to second-degree AV block Mobitz type I.  Appropriately anticoagulated with Eliquis without interruption.   Underlying cause for atrial fibrillation appears to be right heart dilation.  No history of thromboembolic disease.   Second-degree AV block: Agree that she will eventually need a pacemaker for AV block, but starting antiarrhythmic therapy for atrial fibrillation would accelerate that process.  Since she is not particularly symptomatic during the episodes of atrial fibrillation we will hold off antiarrhythmics (if we do we will choose dofetilide).  Avoid any medications with negative chronotropic effect. Chronic right heart failure: Pulmonary pressure was moderately elevated.  Suspect underlying obstructive sleep apnea will schedule a sleep study.  Moderate to severe tricuspid regurgitation is most likely secondary.  Adjust diuretics for relief of edema.   Loop recorder:  Enrolled in ALLEVIATE-HF. Iron deficiency anemia: Has not recurred even though she is on continued anticoagulation Gastric ulcer, H. pylori positive: Expected to be healed at this point. Mild obesity: Increases likelihood of arrhythmia recurrence and possibility that she has obstructive sleep apnea.  Strongly encourage efforts to lose weight.     We will have her follow-up in the A-fib clinic next week.  If she remains in persistent atrial fibrillation, we will schedule for cardioversion.  Would also benefit from evaluation and discussion of ablation.  Antiarrhythmics may not be tolerated well (at least not without a pacemaker) due to her tendency to have bradycardia.      Medication Adjustments/Labs and Tests Ordered: Current medicines are reviewed at length with the patient today.  Concerns regarding  medicines are outlined above.  Orders Placed This Encounter  Procedures   ECHOCARDIOGRAM COMPLETE   Itamar Sleep Study   No orders of the defined types were placed in this encounter.   Patient Instructions  Medication Instructions:  No changes *If you need a refill on your cardiac medications before your next appointment, please call your pharmacy*  Testing/Procedures: Your physician has requested that you have an echocardiogram. Echocardiography is a painless test that uses sound waves to create images of your heart. It provides your doctor with information about the size and shape of your heart and how well your heart's chambers and valves are working. This procedure takes approximately one hour. There are no restrictions for this procedure. Please do NOT wear cologne, perfume, aftershave, or lotions (deodorant is allowed). Please arrive 15 minutes prior to your appointment time.   WatchPAT?  Is a FDA cleared portable home sleep study test that uses a watch and 3 points of contact to monitor 7 different channels, including your heart rate, oxygen saturations, body position, snoring, and chest motion.  The study is easy to use from the comfort of your own home and accurately detect sleep apnea.  Before bed, you attach the chest sensor, attached the sleep apnea bracelet to your nondominant hand, and attach the finger probe.  After the study, the raw data is downloaded from the watch and scored for apnea events.   For more information: [email or web address]  Patient Testing Instructions:  Do not put battery into the device until bedtime when you are ready to begin the test. Please call the support number if you need assistance after following the instructions below: 24 hour support line- [phone] or ITAMAR support at [phone] (option 2)  Download the Itamar "WatchPAT One" app through the [employer]  play store or App Store  Be sure to turn on or enable access to  bluetooth in [place] on your smartphone/ device  Make sure no other bluetooth devices are on and within the vicinity of your smartphone/ device and WatchPAT watch during testing.  Make sure to leave your smart phone/ device plugged in and charging all night.  When ready for bed:  Follow the instructions step by step in the WatchPAT One App to activate the testing device. For additional instructions, including video instruction, visit the WatchPAT One video on Youtube. You can search for [place] One within Youtube (video is 4 minutes and 18 seconds) or enter: https://youtube/watch?v=BCce_vbiwxE Please note: You will be prompted to enter a Pin to connect via bluetooth when starting the test. The PIN will be assigned to you when you receive the test.  The device is disposable, but it recommended that you retain the device until you receive a call letting you know the study has been received and the results have been interpreted.  We will let you know if the study did not transmit to [place] properly after the test is completed. You do not need to call us to confirm the receipt of the test.  Please complete the test within 48 hours of receiving PIN.   Frequently Asked Questions:  What is Watch [place] one?  A single use fully disposable home sleep apnea testing device and will not need to be returned after completion.  What are the requirements to use WatchPAT one?  The be able to have a successful watchpat one sleep study, you should have your Watch pat one device, your smart phone, watch pat one app, your PIN number and Internet access What type of phone do I need?  You should have a smart phone that uses Android 5.1 and above or any Iphone with IOS 10 and above How can I download the WatchPAT one app?  Based on your device type search for WatchPAT one app either in google play for android devices or APP store for Iphone's Where will I get my PIN for the study?  Your PIN will be provided by your  physician's office. It is used for authentication and if you lose/forget your PIN, please reach out to your providers office.  I do not have Internet at home. Can I do WatchPAT one study?  WatchPAT One needs Internet connection throughout the night to be able to transmit the sleep data. You can use your home/local internet or your cellular's data package. However, it is always recommended to use home/local Internet. It is estimated that between 20MB-30MB will be used with each study.However, the application will be looking for 80MB space in the phone to start the study.  What happens if I lose internet or bluetooth connection?  During the internet disconnection, your phone will not be able to transmit the sleep data. All the data, will be stored in your phone. As soon as the internet connection is back on, the phone will being sending the sleep data. During the bluetooth disconnection, WatchPAT one will not be able to to send the sleep data to your phone. Data will be kept in the [facility] one until two devices have bluetooth connection back on. As soon as the connection is back on, WatchPAT one will send the sleep data to the phone.  How long do I need to wear the WatchPAT one?  After you start the study, you should wear the device at least 6 hours.  How far should I keep my phone from the device?  During the night, your phone should be within 15 feet.  What happens if I leave the room for restroom or other reasons?  Leaving the room for any reason will not cause any problem. As soon as your get back to the room, both devices will reconnect and will continue to send the sleep data. Can I use my phone during the sleep study?  Yes, you can use your phone as usual during the study. But it is recommended to put your watchpat one on when you are ready to go to bed.  How will I get my study results?  A soon as you completed your study, your sleep data will be sent to the provider. They will then share the  results with you when they are ready.     Follow-Up: At [facility], you and your health needs are our priority.  As part of our continuing mission to provide you with exceptional heart care, we have created designated Provider Care Teams.  These Care Teams include your primary Cardiologist (physician) and Advanced Practice Providers (APPs -  Physician Assistants and Nurse Practitioners) who all work together to provide you with the care you need, when you need it.  We recommend signing up for the patient portal called "MyChart".  Sign up information is provided on this After Visit Summary.  MyChart is used to connect with patients for Virtual Visits (Telemedicine).  Patients are able to view lab/test results, encounter notes, upcoming appointments, etc.  Non-urgent messages can be sent to your provider as well.   To learn more about what you can do with MyChart, go to [email or web address].    Your next appointment:   1 year(s)  Provider:   Sanda Klein, MD        Signed, Sanda Klein, MD  01/31/2023 3:04 PM    [place]

## 2023-02-04 ENCOUNTER — Encounter: Payer: Self-pay | Admitting: Cardiovascular Disease

## 2023-02-05 ENCOUNTER — Telehealth: Payer: Self-pay | Admitting: *Deleted

## 2023-02-05 NOTE — Telephone Encounter (Signed)
Secure chat message sent to [place] ok to activate itamar device. 

## 2023-02-06 ENCOUNTER — Telehealth: Payer: Self-pay

## 2023-02-06 NOTE — Telephone Encounter (Signed)
I called the patient to speak to her about the Itamar device, LVM to return my call.

## 2023-02-13 ENCOUNTER — Encounter: Payer: Self-pay | Admitting: Internal Medicine

## 2023-02-27 DIAGNOSIS — Z006 Encounter for examination for normal comparison and control in clinical research program: Secondary | ICD-10-CM

## 2023-02-27 NOTE — Research (Signed)
Alleviate HF Carelink Transmission        

## 2023-03-04 ENCOUNTER — Ambulatory Visit (HOSPITAL_COMMUNITY): Payer: 59 | Attending: Internal Medicine

## 2023-03-04 ENCOUNTER — Telehealth: Payer: Self-pay | Admitting: Emergency Medicine

## 2023-03-04 DIAGNOSIS — I4819 Other persistent atrial fibrillation: Secondary | ICD-10-CM

## 2023-03-04 LAB — ECHOCARDIOGRAM COMPLETE
Area-P 1/2: 3.12 cm2
S' Lateral: 3.1 cm

## 2023-03-04 MED ORDER — PERFLUTREN LIPID MICROSPHERE
1.0000 mL | INTRAVENOUS | Status: AC | PRN
  Administered 2023-03-04: 2 mL via INTRAVENOUS

## 2023-03-04 NOTE — Telephone Encounter (Signed)
Called, no answer, left message with Echo result and call back number:  Normal echocardiogram.  The leak in the tricuspid valve is much much better than in the past.

## 2023-03-10 ENCOUNTER — Other Ambulatory Visit: Payer: Self-pay | Admitting: Cardiovascular Disease

## 2023-03-10 ENCOUNTER — Ambulatory Visit (INDEPENDENT_AMBULATORY_CARE_PROVIDER_SITE_OTHER): Payer: 59 | Admitting: Internal Medicine

## 2023-03-10 ENCOUNTER — Encounter: Payer: Self-pay | Admitting: Internal Medicine

## 2023-03-10 VITALS — BP 130/78 | HR 72 | Temp 97.5°F | Resp 18 | Ht 66.0 in | Wt 229.1 lb

## 2023-03-10 DIAGNOSIS — M79602 Pain in left arm: Secondary | ICD-10-CM | POA: Diagnosis not present

## 2023-03-10 DIAGNOSIS — I480 Paroxysmal atrial fibrillation: Secondary | ICD-10-CM | POA: Diagnosis not present

## 2023-03-10 DIAGNOSIS — I5032 Chronic diastolic (congestive) heart failure: Secondary | ICD-10-CM

## 2023-03-10 LAB — BASIC METABOLIC PANEL
BUN: 19 mg/dL (ref 6–23)
CO2: 32 mEq/L (ref 19–32)
Calcium: 9.6 mg/dL (ref 8.4–10.5)
Chloride: 99 mEq/L (ref 96–112)
Creatinine, Ser: 0.81 mg/dL (ref 0.40–1.20)
GFR: 72.12 mL/min (ref >60.00)
Glucose, Bld: 81 mg/dL (ref 70–99)
Potassium: 4 mEq/L (ref 3.5–5.1)
Sodium: 143 mEq/L (ref 135–145)

## 2023-03-10 LAB — CBC WITH DIFFERENTIAL/PLATELET
Basophils Absolute: 0 10*3/uL (ref 0.0–0.1)
Basophils Relative: 0.4 % (ref 0.0–3.0)
Eosinophils Absolute: 0.1 10*3/uL (ref 0.0–0.7)
Eosinophils Relative: 2.3 % (ref 0.0–5.0)
HCT: 40.3 % (ref 36.0–46.0)
Hemoglobin: 13.6 g/dL (ref 12.0–15.0)
Lymphocytes Relative: 30.3 % (ref 12.0–46.0)
Lymphs Abs: 1.6 10*3/uL (ref 0.7–4.0)
MCHC: 33.8 g/dL (ref 30.0–36.0)
MCV: 93.7 fl (ref 78.0–100.0)
Monocytes Absolute: 0.7 10*3/uL (ref 0.1–1.0)
Monocytes Relative: 13.3 % — ABNORMAL HIGH (ref 3.0–12.0)
Neutro Abs: 2.8 10*3/uL (ref 1.4–7.7)
Neutrophils Relative %: 53.7 % (ref 43.0–77.0)
Platelets: 132 10*3/uL — ABNORMAL LOW (ref 150.0–400.0)
RBC: 4.3 Mil/uL (ref 3.87–5.11)
RDW: 12.8 % (ref 11.5–15.5)
WBC: 5.3 10*3/uL (ref 4.0–10.5)

## 2023-03-10 LAB — LIPID PANEL
Cholesterol: 200 mg/dL (ref 0–200)
HDL: 62.3 mg/dL (ref >39.00)
LDL Cholesterol: 123 mg/dL — ABNORMAL HIGH (ref 0–99)
NonHDL: 137.29
Total CHOL/HDL Ratio: 3
Triglycerides: 72 mg/dL (ref 0.0–149.0)
VLDL: 14.4 mg/dL (ref 0.0–40.0)

## 2023-03-10 NOTE — Telephone Encounter (Signed)
Prescription refill request for Eliquis received. Indication: AF Last office visit: 01/31/23  Judie Petit Croitoru MD Scr: 0.82 on 10/15/22  Epic Age: 72 Weight: 104.6kg  Based on above findings Eliquis 5mg  twice daily is the appropriate dose.  Refill approved.

## 2023-03-10 NOTE — Patient Instructions (Addendum)
Vaccines I recommend:  Covid booster Shingrix (shingles) RSV vaccine   Check the  blood pressure regularly BP GOAL is between 110/65 and  135/85. If it is consistently higher or lower, let me know      GO TO THE LAB : Get the blood work     GO TO THE FRONT DESK, PLEASE SCHEDULE YOUR APPOINTMENTS Come back for   a physical exam in 4 months

## 2023-03-10 NOTE — Progress Notes (Signed)
Subjective:    Patient ID: Brittney Walker, female    DOB: [birth year], 72 y.o.   MRN: [id number]  DOS:  03/10/2023 Type of visit - description: Follow-up  Chronic medical problems addressed. Since the last visit saw cardiology, note reviewed.  Reports arm pain, worse on the left, going on for a while, would like something done about it. Pain is located mostly at the side of the arm, from the shoulder down. No neck pain per se No paresthesias.   Review of Systems See above   Past Medical History:  Diagnosis Date   Atrial fibrillation (HCC) 10/04/2021   Carotid bruit    w/ neg dopplers    CHF (congestive heart failure) (HCC)    History of gastric ulcer 10/04/2021   Hyperthyroidism     s/p RAI 2004   Normocytic anemia    Persistent atrial fibrillation (HCC)    a. Dx 07/2021; b. CHA2DS2VASc = 3-->Eliquis; c. 10/2021 s/p DCCV.   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   Vitamin D deficiency     Past Surgical History:  Procedure Laterality Date   ABDOMINAL HYSTERECTOMY  11/05/1995   TAH-LEIOMYOMATA-- no BSO   BIOPSY  12/25/2021   Procedure: BIOPSY;  Surgeon: Sherrilyn Rist, MD;  Location: [facility];  Service: Gastroenterology;;   BIOPSY THYROID      bx of largest nodule 2005, benign   BREAST SURGERY     x's 2--fibrocystic dz   CARDIOVERSION N/A 10/10/2021   Procedure: CARDIOVERSION;  Surgeon: Thurmon Fair, MD;  Location: [facility];  Service: Cardiovascular;  Laterality: N/A;   CARDIOVERSION N/A 08/23/2022   Procedure: CARDIOVERSION;  Surgeon: Parke Poisson, MD;  Location: [facility] ENDOSCOPY;  Service: Cardiovascular;  Laterality: N/A;   CATARACT EXTRACTION, BILATERAL Bilateral    COLONOSCOPY WITH PROPOFOL N/A 12/25/2021   Procedure: COLONOSCOPY WITH PROPOFOL;  Surgeon: Sherrilyn Rist, MD;  Location: [facility];  Service: Gastroenterology;  Laterality: N/A;   ESOPHAGOGASTRODUODENOSCOPY (EGD) WITH PROPOFOL N/A 10/12/2021   Procedure:  ESOPHAGOGASTRODUODENOSCOPY (EGD) WITH PROPOFOL;  Surgeon: Sherrilyn Rist, MD;  Location: [facility] ENDOSCOPY;  Service: Gastroenterology;  Laterality: N/A;   ESOPHAGOGASTRODUODENOSCOPY (EGD) WITH PROPOFOL N/A 12/25/2021   Procedure: ESOPHAGOGASTRODUODENOSCOPY (EGD) WITH PROPOFOL;  Surgeon: Sherrilyn Rist, MD;  Location: [facility];  Service: Gastroenterology;  Laterality: N/A;   POLYPECTOMY  12/25/2021   Procedure: POLYPECTOMY;  Surgeon: Sherrilyn Rist, MD;  Location: [facility];  Service: Gastroenterology;;   TONSILLECTOMY      Current Outpatient Medications  Medication Instructions   calcium carbonate (OS-CAL - DOSED IN MG OF ELEMENTAL CALCIUM) 1,250 mg, Oral, Daily with breakfast   cholecalciferol (VITAMIN D) 1,000 Units, Daily   cyanocobalamin (VITAMIN B12) 1,000 mcg, Oral, Daily   Eliquis 5 mg, Oral, 2 times daily   furosemide (LASIX) 20 MG tablet As needed patient may take 40 MG Lasix PRN by mouth QD x 4 days as directed per Alleviate Research HF Study.<BR>PLEASE DO NOT REMOVE PRESCRIPTION<BR>PRN ONLY IF DIRECTED   furosemide (LASIX) 40 mg, Oral, Daily   Multiple Vitamin (MULTIVITAMIN WITH MINERALS) TABS tablet 1 tablet, Oral, Every morning, Alive   omeprazole (PRILOSEC) 40 mg, Oral, Daily   potassium chloride (KLOR-CON) 10 MEQ tablet Patient may take additional 20 mEq potassium BY MOUTH DAILY AS DIRECTED for Alleviate HF Research Study.<BR>PLEASE DO NOT REMOVE PRESCRIPTION<BR>PRN ONLY IF DIRECTED BY STUDY TEAM   potassium chloride SA (KLOR-CON M) 20 MEQ tablet 20 mEq, Oral, Daily  vitamin A 10,000 Units, Daily   vitamin C 1,000 mg, Oral, Daily   vitamin E 400 Units, Daily   zinc gluconate 50 mg, Oral, Daily       Objective:   Physical Exam BP 130/78   Pulse 72   Temp (!) 97.5 F (36.4 C) (Oral)   Resp 18   Ht 5\' 6"  (1.676 m)   Wt 229 lb 2 oz (103.9 kg)   LMP 11/05/1995   SpO2 97%   BMI 36.98 kg/m  General:   Well developed, NAD, BMI noted. HEENT:   Normocephalic . Face symmetric, atraumatic Lungs:  CTA B Normal respiratory effort, no intercostal retractions, no accessory muscle use. Heart: RRR,  no murmur.  Lower extremities: no pretibial edema bilaterally Upper extremities: range of motion of the shoulder seems slightly decreased on the left. Hawkins sign + on the left? Skin: Not pale. Not jaundice Neurologic:  alert & oriented X3.  Speech normal, gait appropriate for age and unassisted Psych--  Cognition and judgment appear intact.  Cooperative with normal attention span and concentration.  Behavior appropriate. No anxious or depressed appearing.      Assessment     Assessment Gout Thyroid disease,  -RAI 2004 - Bx 2005 neg -[place] 10-2014-- new nodule, Bx unsuccessful 12-2014 Morbid obesity  H/o  VIN I,  2000 Vitamin D deficiency CV: Carotid bruit w/ negative Dopplers A- Fibrillation,Dx 07-2021, cardioverted 10/11/2021, 08/23/2022. CHF Dx 07-2021 Acute anemia due to H. pylori + gastric ulcer 10/2021   PLAN Atrial fibrillation: Saw cardiology 01/31/2023, noted to be a low  atrial fibrillation burden after cardioversion.  No medications were changed, she is anticoagulated. Plan: Check FLP and CBC. CHF: Does not seem volume overloaded.  Currently on Lasix 40 mg daily, KCl 20 mg tablets daily.  (She also takes Lasix on KCl as needed per a research study she participates at). L arm pain: Sports medicine  RTC 4 months CPX

## 2023-03-10 NOTE — Assessment & Plan Note (Signed)
Atrial fibrillation: Saw cardiology 01/31/2023, noted to be a low  atrial fibrillation burden after cardioversion.  No medications were changed, she is anticoagulated. Plan: Check FLP and CBC. CHF: Does not seem volume overloaded.  Currently on Lasix 40 mg daily, KCl 20 mg tablets daily.  (She also takes Lasix on KCl as needed per a research study she participates at). L arm pain: Sports medicine  RTC 4 months CPX

## 2023-03-19 NOTE — Telephone Encounter (Signed)
I called the patient to speak to her about her Itamar device pin #, LVM for her to return my call.

## 2023-03-21 ENCOUNTER — Telehealth: Payer: Self-pay | Admitting: Cardiovascular Disease

## 2023-03-21 NOTE — Telephone Encounter (Signed)
Patient is aware of echo results. She verbalized understanding. 

## 2023-03-21 NOTE — Telephone Encounter (Signed)
Patient wants a call back regarding test results

## 2023-03-25 ENCOUNTER — Ambulatory Visit (INDEPENDENT_AMBULATORY_CARE_PROVIDER_SITE_OTHER): Payer: 59

## 2023-03-25 ENCOUNTER — Encounter: Payer: Self-pay | Admitting: Family Medicine

## 2023-03-25 ENCOUNTER — Other Ambulatory Visit: Payer: Self-pay

## 2023-03-25 ENCOUNTER — Ambulatory Visit (INDEPENDENT_AMBULATORY_CARE_PROVIDER_SITE_OTHER): Payer: 59 | Admitting: Family Medicine

## 2023-03-25 ENCOUNTER — Telehealth: Payer: Self-pay

## 2023-03-25 VITALS — BP 110/62 | HR 59 | Ht 66.0 in | Wt 228.0 lb

## 2023-03-25 DIAGNOSIS — M79602 Pain in left arm: Secondary | ICD-10-CM

## 2023-03-25 DIAGNOSIS — M5412 Radiculopathy, cervical region: Secondary | ICD-10-CM | POA: Diagnosis not present

## 2023-03-25 DIAGNOSIS — E785 Hyperlipidemia, unspecified: Secondary | ICD-10-CM

## 2023-03-25 MED ORDER — ATORVASTATIN CALCIUM 20 MG PO TABS: 20.0000 mg | ORAL_TABLET | Freq: Every day | ORAL | 0 refills | Status: DC

## 2023-03-25 NOTE — Telephone Encounter (Signed)
Pt called back- informed of results and recommendations. Pt verbalized understanding. Rx sent to [facility]. AST, ALT, FLP ordered. Lab appt scheduled.

## 2023-03-25 NOTE — Telephone Encounter (Signed)
Conrad King, CMA 03/17/2023  9:07 AM EDT Back to Top    Have been unable to reach Pt via telephone. Letter mailed asking for call back.   Conrad [place], CMA 03/14/2023  2:30 PM EDT     [facility] asking for call back   Conrad [place], [facility] 03/12/2023  8:35 AM EDT     [facility] asking for call back.   Wanda Plump, MD 03/12/2023  8:30 AM EDT     The 10-year ASCVD risk score (Arnett DK, et al., 2019) is: 15.3% Please call patient. Cholesterol has increased, recommend atorvastatin 20 mg daily.  Send a prescription and arrange for FLP AST ALT in 6 weeks. If patient declines will discuss at the next visit. Other labs are okay.
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
## 2023-03-25 NOTE — Patient Instructions (Signed)
Thank you for coming in today.   Please get an Xray today before you leave   I've referred you to Physical Therapy.  Let us know if you don't hear from them in one week.   Recheck in 6 weeks.   Let me know if this does not work.

## 2023-04-01 NOTE — Progress Notes (Signed)
Cervical spine x-ray shows some arthritis changes in the neck.  No fractures are visible.

## 2023-04-01 NOTE — Progress Notes (Signed)
Left forearm x-ray looks normal to radiology.

## 2023-04-09 ENCOUNTER — Ambulatory Visit: Payer: 59 | Admitting: Physical Therapy

## 2023-04-11 DIAGNOSIS — Z006 Encounter for examination for normal comparison and control in clinical research program: Secondary | ICD-10-CM

## 2023-04-11 NOTE — Research (Signed)
Alleviate HF Research Study 18 month follow up  No cardiovascular medications changes or adverse events to report at this time.   Current Outpatient Medications:    Ascorbic Acid (VITAMIN C) 1000 MG tablet, Take 1,000 mg by mouth daily., Disp: , Rfl:    atorvastatin (LIPITOR) 20 MG tablet, Take 1 tablet (20 mg total) by mouth at bedtime., Disp: 90 tablet, Rfl: 0   calcium carbonate (OS-CAL - DOSED IN MG OF ELEMENTAL CALCIUM) 1250 MG tablet, Take 1,250 mg by mouth daily with breakfast., Disp: , Rfl:    cholecalciferol (VITAMIN D) 1000 UNITS tablet, Take 1,000 Units by mouth daily., Disp: , Rfl:    ELIQUIS 5 MG TABS tablet, TAKE 1 TABLET BY MOUTH TWICE A DAY, Disp: 180 tablet, Rfl: 1   furosemide (LASIX) 20 MG tablet, As needed patient may take 40 MG Lasix PRN by mouth QD x 4 days as directed per Alleviate Research HF Study. PLEASE DO NOT REMOVE PRESCRIPTION PRN ONLY IF DIRECTED, Disp: 90 tablet, Rfl: 3   furosemide (LASIX) 40 MG tablet, Take 1 tablet (40 mg total) by mouth daily., Disp: 90 tablet, Rfl: 3   Multiple Vitamin (MULTIVITAMIN WITH MINERALS) TABS tablet, Take 1 tablet by mouth in the morning. Alive, Disp: , Rfl:    potassium chloride (KLOR-CON) 10 MEQ tablet, Patient may take additional 20 mEq potassium BY MOUTH DAILY AS DIRECTED for Alleviate HF Research Study. PLEASE DO NOT REMOVE PRESCRIPTION PRN ONLY IF DIRECTED BY STUDY TEAM, Disp: 90 tablet, Rfl: 3   potassium chloride SA (KLOR-CON M) 20 MEQ tablet, Take 1 tablet (20 mEq total) by mouth daily., Disp: 90 tablet, Rfl: 3   vitamin A 11914 UNIT capsule, Take 10,000 Units by mouth daily., Disp: , Rfl:    vitamin B-12 (CYANOCOBALAMIN) 1000 MCG tablet, Take 1,000 mcg by mouth daily., Disp: , Rfl:    vitamin E 400 UNIT capsule, Take 400 Units by mouth daily., Disp: , Rfl:    zinc gluconate 50 MG tablet, Take 50 mg by mouth daily., Disp: , Rfl:

## 2023-04-23 ENCOUNTER — Ambulatory Visit (INDEPENDENT_AMBULATORY_CARE_PROVIDER_SITE_OTHER): Payer: Medicare Other | Admitting: Physical Therapy

## 2023-04-23 ENCOUNTER — Other Ambulatory Visit: Payer: Self-pay

## 2023-04-23 ENCOUNTER — Encounter: Payer: Self-pay | Admitting: Physical Therapy

## 2023-04-23 DIAGNOSIS — R293 Abnormal posture: Secondary | ICD-10-CM | POA: Diagnosis not present

## 2023-04-23 DIAGNOSIS — M79602 Pain in left arm: Secondary | ICD-10-CM | POA: Diagnosis not present

## 2023-04-23 DIAGNOSIS — M542 Cervicalgia: Secondary | ICD-10-CM

## 2023-04-23 DIAGNOSIS — M6281 Muscle weakness (generalized): Secondary | ICD-10-CM | POA: Diagnosis not present
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
## 2023-04-29 ENCOUNTER — Encounter: Payer: Medicare Other | Admitting: Physical Therapy

## 2023-04-29 ENCOUNTER — Encounter: Payer: Self-pay | Admitting: Physical Therapy

## 2023-04-29 NOTE — Therapy (Signed)
No-show for today's appt. Called and left message regarding missed appt and time/date of next scheduled session.   Nedra Hai, PT, DPT 04/29/23 2:05 PM

## 2023-05-05 NOTE — Progress Notes (Unsigned)
   Rubin Payor, PhD, LAT, ATC acting as a scribe for [name], MD.  Brittney Walker is a 72 y.o. female who presents to [facility] at [facility] today for f/u bilat forearm pain. Pt was last seen by Dr. Denyse Amass on 03/25/23 and was referred to PT, completing 1 visit.  Today, pt reports ***  Dx imaging: 03/25/23 L forearm & c-spine XR  Pertinent review of systems: ***  Relevant historical information: ***   Exam:  LMP 11/05/1995  General: Well Developed, well nourished, and in no acute distress.   MSK: ***    Lab and Radiology Results No results found for this or any previous visit (from the past 72 hour(s)). No results found.     Assessment and Plan: 72 y.o. female with ***   PDMP not reviewed this encounter. No orders of the defined types were placed in this encounter.  No orders of the defined types were placed in this encounter.    Discussed warning signs or symptoms. Please see discharge instructions. Patient expresses understanding.   ***

## 2023-05-06 ENCOUNTER — Ambulatory Visit: Payer: Medicare Other | Admitting: Family Medicine

## 2023-05-06 ENCOUNTER — Ambulatory Visit (INDEPENDENT_AMBULATORY_CARE_PROVIDER_SITE_OTHER): Payer: Medicare Other | Admitting: Physical Therapy

## 2023-05-06 ENCOUNTER — Encounter: Payer: Self-pay | Admitting: Family Medicine

## 2023-05-06 ENCOUNTER — Encounter: Payer: Self-pay | Admitting: Physical Therapy

## 2023-05-06 VITALS — BP 112/70 | HR 60 | Ht 66.0 in | Wt 238.6 lb

## 2023-05-06 DIAGNOSIS — M542 Cervicalgia: Secondary | ICD-10-CM | POA: Diagnosis not present

## 2023-05-06 DIAGNOSIS — M79602 Pain in left arm: Secondary | ICD-10-CM

## 2023-05-06 DIAGNOSIS — M6281 Muscle weakness (generalized): Secondary | ICD-10-CM | POA: Diagnosis not present

## 2023-05-06 DIAGNOSIS — M79601 Pain in right arm: Secondary | ICD-10-CM | POA: Diagnosis not present

## 2023-05-06 DIAGNOSIS — R293 Abnormal posture: Secondary | ICD-10-CM | POA: Diagnosis not present

## 2023-05-06 NOTE — Therapy (Signed)
OUTPATIENT PHYSICAL THERAPY TREATMENT   Patient Name: Brittney Walker MRN: [id number] DOB:15-Jul-1951, 72 y.o., female Today's Date: 05/06/2023  END OF SESSION:  PT End of Session - 05/06/23 0851     Visit Number 2    Number of Visits 12    Date for PT Re-Evaluation 06/04/23    Authorization Type U[employer] Medicare    Progress Note Due on Visit 10    PT Start Time 0846    PT Stop Time 0927    PT Time Calculation (min) 41 min    Activity Tolerance Patient tolerated treatment well    Behavior During Therapy [facility] for tasks assessed/performed              Past Medical History:  Diagnosis Date   Atrial fibrillation (HCC) 10/04/2021   Carotid bruit    w/ neg dopplers    CHF (congestive heart failure) (HCC)    History of gastric ulcer 10/04/2021   Hyperthyroidism     s/p RAI 2004   Normocytic anemia    Persistent atrial fibrillation (HCC)    a. Dx 07/2021; b. CHA2DS2VASc = 3-->Eliquis; c. 10/2021 s/p DCCV.   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   Vitamin D deficiency    Past Surgical History:  Procedure Laterality Date   ABDOMINAL HYSTERECTOMY  11/05/1995   TAH-LEIOMYOMATA-- no BSO   BIOPSY  12/25/2021   Procedure: BIOPSY;  Surgeon: Sherrilyn Rist, MD;  Location: [facility] ENDOSCOPY;  Service: Gastroenterology;;   BIOPSY THYROID      bx of largest nodule 2005, benign   BREAST SURGERY     x's 2--fibrocystic dz   CARDIOVERSION N/A 10/10/2021   Procedure: CARDIOVERSION;  Surgeon: Thurmon Fair, MD;  Location: [facility]OSCOPY;  Service: Cardiovascular;  Laterality: N/A;   CARDIOVERSION N/A 08/23/2022   Procedure: CARDIOVERSION;  Surgeon: Parke Poisson, MD;  Location: [facility] ENDOSCOPY;  Service: Cardiovascular;  Laterality: N/A;   CATARACT EXTRACTION, BILATERAL Bilateral    COLONOSCOPY WITH PROPOFOL N/A 12/25/2021   Procedure: COLONOSCOPY WITH PROPOFOL;  Surgeon: Sherrilyn Rist, MD;  Location: [facility];  Service: Gastroenterology;  Laterality: N/A;    ESOPHAGOGASTRODUODENOSCOPY (EGD) WITH PROPOFOL N/A 10/12/2021   Procedure: ESOPHAGOGASTRODUODENOSCOPY (EGD) WITH PROPOFOL;  Surgeon: Sherrilyn Rist, MD;  Location: [facility] ENDOSCOPY;  Service: Gastroenterology;  Laterality: N/A;   ESOPHAGOGASTRODUODENOSCOPY (EGD) WITH PROPOFOL N/A 12/25/2021   Procedure: ESOPHAGOGASTRODUODENOSCOPY (EGD) WITH PROPOFOL;  Surgeon: Sherrilyn Rist, MD;  Location: [facility] ENDOS[facility];  Service: Gastroenterology;  Laterality: N/A;   POLYPECTOMY  12/25/2021   Procedure: POLYPECTOMY;  Surgeon: Sherrilyn Rist, MD;  Location: [facility]OSCOPY;  Service: Gastroenterology;;   TONSILLECTOMY     Patient Active Problem List   Diagnosis Date Noted   Aortic atherosclerosis (HCC) 11/21/2021   Acute diastolic heart failure (HCC) 10/17/2021   Pulmonary hypertension, unspecified (HCC) 10/17/2021   PAF (paroxysmal atrial fibrillation) (HCC)    Symptomatic anemia 10/10/2021   Heart 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)    Persistent atrial fibrillation (HCC) 07/24/2021   Secondary hypercoagulable state (HCC) 07/24/2021   Morbid obesity (HCC) 12/10/2017   PCP NTES >>>>>>>>>>>>>>>>>>>>>>>> 12/04/2016   Pain in joint, ankle and foot 12/21/2014   Annual physical exam 10/14/2014   Vitamin D deficiency 02/14/2012   GOITER, MULTINODULAR 08/24/2009   Hypothyroidism 08/24/2009   HEMANGIOMA, SKIN 06/17/2007   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    PCP: Wanda Plump, MD  REFERRING PROVIDER: Rodolph Bong,  MD  REFERRING DIAG: M79.602 (ICD-10-CM) - Left arm pain M54.12 (ICD-10-CM) - Cervical radiculitis  Rationale for Evaluation and Treatment: Rehabilitation  THERAPY DIAG:  Cervicalgia  Muscle weakness (generalized)  Pain in left arm  Abnormal posture  ONSET DATE: chronic; date of referral 03/25/23   SUBJECTIVE:                                                                                                                                                                                            SUBJECTIVE STATEMENT: Missed last week due to no AC, states her arm still hurts but only when reaching; radiates to hand Hasn't done any exercises since last visit; house was too hot  PERTINENT HISTORY:  A-fib, CHF  PAIN:  Are you having pain? Yes: NPRS scale: 5 currently, up to 10; at best 4/10 Pain location: LUE; in forearm (does have some in Rt as well) Pain description: aching Aggravating factors: cold, activity Relieving factors: unsure  PRECAUTIONS:  None  WEIGHT BEARING RESTRICTIONS:  No  FALLS:  Has patient fallen in last 6 months? No  LIVING ENVIRONMENT: Lives with: lives alone Lives in: House/apartment Stairs: Yes, denies difficulty  OCCUPATION:  Retired from [employer]  PLOF:  Independent and Leisure: travel; no regular exercise  PATIENT GOALS:  Improve pain  NEXT MD VISIT: 05/06/23   OBJECTIVE:   DIAGNOSTIC FINDINGS:  X-Rays: C-spine: Loss of cervical lordosis.  Multilevel mild DDD present.  No acute fractures are visible.   Left forearm: No acute bony abnormality visible in the forearm  PATIENT SURVEYS:  04/23/23: FOTO 36 (predicted 48)  COGNITIVE STATUS: Within functional limits for tasks assessed   RED FLAGS: None    SENSATION: Reports occasional tingling in bil fingers/hands  POSTURE:  rounded shoulders, forward head, and increased thoracic kyphosis  HAND DOMINANCE:  Right  GAIT: 04/23/23 Comments: independent   Cervical and LUE  PALPATION: 04/23/23: diffuse tenderness to forearm; no specific active trigger points noted  CERVICAL ROM:   Active ROM A/PROM (deg) eval  Flexion 12  Extension 18  Right lateral flexion 6  Left lateral flexion 4  Right rotation Limited 50%  Left rotation Limited 50%   (Blank rows = not tested)   UPPER EXTREMITY ROM:  mild limitations noted in wrist AROM; did not formally measure at eval  Active ROM Right eval Left eval Rt/Lt 05/06/23  Shoulder flexion 90  90 109/125  Shoulder extension     Shoulder abduction 90 90 100/95  Shoulder adduction     Shoulder extension     Shoulder internal rotation     Shoulder external rotation      (  Blank rows = not tested)   UPPER EXTREMITY MMT: Give way weakness noted; mod cues for resistance  MMT Right eval Left eval  Shoulder flexion 3/5 3/5  Shoulder extension    Shoulder abduction 3/5 3/5  Shoulder adduction    Shoulder extension    Shoulder internal rotation 4/5 4/5  Shoulder external rotation 4/5 4/5  Wrist extension  3/5  Wrist pronation  5/5  Wrist supination  3/5  Grip strength     (Blank rows = not tested)   SPECIAL TESTS:  04/23/23: Cervical Spurling's test: Negative   TREATMENT:                                                                                                                              DATE:  05/06/23 TherEx UBE L2 x 6 min (3 min each direction) Review of HEP - max cues needed as she hasn't completed since initial eval AROM measurements - see above Rows L3 band 2x10; 5 sec hold  04/23/23  See HEP - performed trial reps PRN for comprehension   PATIENT EDUCATION:  Education details: HEP Person educated: Patient Education method: Programmer, multimedia, Demonstration, and Handouts Education comprehension: verbalized understanding, returned demonstration, and needs further education  HOME EXERCISE PROGRAM: Access Code: [email or web address] URL: [email or web address] Date: 04/23/2023 Prepared by: Moshe Cipro  Exercises - Standing Wrist Flexion Stretch  - 2 x daily - 7 x weekly - 1 sets - 3 reps - 30 sec hold - Wrist Prayer Stretch  - 2 x daily - 7 x weekly - 1 sets - 3 reps - 30 sec hold - Reverse Prayer Stretch  - 2 x daily - 7 x weekly - 1 sets - 3 reps - 30 sec hold - Wrist Tendon Gliding  - 2 x daily - 7 x weekly - 1 sets - 10 reps - Wrist Flexion with Dumbbell  - 1 x daily - 7 x weekly - 2 sets - 10 reps - Wrist Extension with Dumbbell  - 1 x daily - 7  x weekly - 2 sets - 10 reps - Standing Shoulder Flexion to 90 Degrees with Dumbbells  - 1 x daily - 7 x weekly - 2 sets - 10 reps - Shoulder Abduction with Dumbbells - Thumbs Up  - 1 x daily - 7 x weekly - 2 sets - 10 reps   ASSESSMENT:  CLINICAL IMPRESSION: Pt arrives today for 2nd session, and first in 2 weeks.  She has not been doing her HEP due to no AC so session today largely focused on HEP review.  She has demonstrated improvement in pain and ROM despite limited compliance with HEP.  No goals met at this time.  Continue skilled PT.   OBJECTIVE IMPAIRMENTS: decreased ROM, decreased strength, hypomobility, increased fascial restrictions, increased muscle spasms, impaired UE functional use, postural dysfunction, and pain.   ACTIVITY LIMITATIONS: carrying, lifting, bathing, toileting, dressing, reach over head, and hygiene/grooming  PARTICIPATION LIMITATIONS:  meal prep, cleaning, laundry, driving, shopping, and community activity  PERSONAL FACTORS: 1-2 comorbidities: CHF, a-fib  are also affecting patient's functional outcome.   REHAB POTENTIAL: Good  CLINICAL DECISION MAKING: Evolving/moderate complexity  EVALUATION COMPLEXITY: Moderate   GOALS: Goals reviewed with patient? Yes  SHORT TERM GOALS: Target date: 05/14/2023   Independent with initial HEP Goal status: ONGOING 05/06/23   LONG TERM GOALS: Target date: 06/04/2023   Independent with final HEP Goal status: INITIAL  2.  FOTO score improved to 48 Goal status: INITIAL  3.  LUE strength improved to 4/5 for improved function with ADLs Goal status: INIITAL  4.  Report pain < 5/10 with reaching and ADLs for improved function Goal status: INITIAL    PLAN:  PT FREQUENCY: 1-2x/week  PT DURATION: 6 weeks  PLANNED INTERVENTIONS: Therapeutic exercises, Therapeutic activity, Neuromuscular re-education, Patient/Family education, Self Care, Joint mobilization, Joint manipulation, Aquatic Therapy, Dry Needling,  Electrical stimulation, Cryotherapy, Moist heat, Taping, Traction, Ultrasound, Ionotophoresis 4mg /ml Dexamethasone, Manual therapy, and Re-evaluation.  PLAN FOR NEXT SESSION: review HEP PRN, continue light strengthening/stretching; cervical/shoulder ROM as able; consider DN   Clarita Crane, PT, DPT 05/06/23 9:26 AM

## 2023-05-06 NOTE — Patient Instructions (Signed)
Thank you for coming in today.   Recheck in 6 weeks if not better.   We may switch from Physical therapy to occupational therapy.   I will ask your therapist.   Keep me updated and recheck sooner if you need to.

## 2023-05-09 ENCOUNTER — Other Ambulatory Visit: Payer: 59

## 2023-05-13 ENCOUNTER — Encounter: Payer: Self-pay | Admitting: Physical Therapy

## 2023-05-13 ENCOUNTER — Ambulatory Visit (INDEPENDENT_AMBULATORY_CARE_PROVIDER_SITE_OTHER): Payer: Medicare Other | Admitting: Physical Therapy

## 2023-05-13 DIAGNOSIS — M79602 Pain in left arm: Secondary | ICD-10-CM

## 2023-05-13 DIAGNOSIS — M542 Cervicalgia: Secondary | ICD-10-CM

## 2023-05-13 DIAGNOSIS — M6281 Muscle weakness (generalized): Secondary | ICD-10-CM

## 2023-05-13 DIAGNOSIS — R293 Abnormal posture: Secondary | ICD-10-CM

## 2023-05-13 NOTE — Therapy (Signed)
OUTPATIENT PHYSICAL THERAPY TREATMENT   Patient Name: Brittney Walker MRN: [id number] DOB:06-Jul-1951, 72 y.o., female Today's Date: 05/13/2023  END OF SESSION:  PT End of Session - 05/13/23 1104     Visit Number 3    Number of Visits 12    Date for PT Re-Evaluation 06/04/23    Authorization Type [employer]HC Medicare    Progress Note Due on Visit 10    PT Start Time 1104    PT Stop Time 1142    PT Time Calculation (min) 38 min    Activity Tolerance Patient tolerated treatment well    Behavior During Therapy [facility] for tasks assessed/performed               Past Medical History:  Diagnosis Date   Atrial fibrillation (HCC) 10/04/2021   Carotid bruit    w/ neg dopplers    CHF (congestive heart failure) (HCC)    History of gastric ulcer 10/04/2021   Hyperthyroidism     s/p RAI 2004   Normocytic anemia    Persistent atrial fibrillation (HCC)    a. Dx 07/2021; b. CHA2DS2VASc = 3-->Eliquis; c. 10/2021 s/p DCCV.   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   Vitamin D deficiency    Past Surgical History:  Procedure Laterality Date   ABDOMINAL HYSTERECTOMY  11/05/1995   TAH-LEIOMYOMATA-- no BSO   BIOPSY  12/25/2021   Procedure: BIOPSY;  Surgeon: Sherrilyn Rist, MD;  Location: [facility];  Service: Gastroenterology;;   BIOPSY THYROID      bx of largest nodule 2005, benign   BREAST SURGERY     x's 2--fibrocystic dz   CARDIOVERSION N/A 10/10/2021   Procedure: CARDIOVERSION;  Surgeon: Thurmon Fair, MD;  Location: [facility] ENDOSCOPY;  Service: Cardiovascular;  Laterality: N/A;   CARDIOVERSION N/A 08/23/2022   Procedure: CARDIOVERSION;  Surgeon: Parke Poisson, MD;  Location: [facility] ENDOSCOPY;  Service: Cardiovascular;  Laterality: N/A;   CATARACT EXTRACTION, BILATERAL Bilateral    COLONOSCOPY WITH PROPOFOL N/A 12/25/2021   Procedure: COLONOSCOPY WITH PROPOFOL;  Surgeon: Sherrilyn Rist, MD;  Location: [facility];  Service: Gastroenterology;  Laterality: N/A;    ESOPHAGOGASTRODUODENOSCOPY (EGD) WITH PROPOFOL N/A 10/12/2021   Procedure: ESOPHAGOGASTRODUODENOSCOPY (EGD) WITH PROPOFOL;  Surgeon: Sherrilyn Rist, MD;  Location: [facility] ENDOSCOPY;  Service: Gastroenterology;  Laterality: N/A;   ESOPHAGOGASTRODUODENOSCOPY (EGD) WITH PROPOFOL N/A 12/25/2021   Procedure: ESOPHAGOGASTRODUODENOSCOPY (EGD) WITH PROPOFOL;  Surgeon: Sherrilyn Rist, MD;  Location: [facility]OS[facility];  Service: Gastroenterology;  Laterality: N/A;   POLYPECTOMY  12/25/2021   Procedure: POLYPECTOMY;  Surgeon: Sherrilyn Rist, MD;  Location: [facility] ENDOSCOPY;  Service: Gastroenterology;;   TONSILLECTOMY     Patient Active Problem List   Diagnosis Date Noted   Aortic atherosclerosis (HCC) 11/21/2021   Acute diastolic heart failure (HCC) 10/17/2021   Pulmonary hypertension, unspecified (HCC) 10/17/2021   PAF (paroxysmal atrial fibrillation) (HCC)    Symptomatic anemia 10/10/2021   Heart 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)    Persistent atrial fibrillation (HCC) 07/24/2021   Secondary hypercoagulable state (HCC) 07/24/2021   Morbid obesity (HCC) 12/10/2017   PCP NTES >>>>>>>>>>>>>>>>>>>>>>>> 12/04/2016   Pain in joint, ankle and foot 12/21/2014   Annual physical exam 10/14/2014   Vitamin D deficiency 02/14/2012   GOITER, MULTINODULAR 08/24/2009   Hypothyroidism 08/24/2009   HEMANGIOMA, SKIN 06/17/2007   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    PCP: Wanda Plump, MD  REFERRING PROVIDER: [name]  S, MD  REFERRING DIAG: Z61.096 (ICD-10-CM) - Left arm pain M54.12 (ICD-10-CM) - Cervical radiculitis  Rationale for Evaluation and Treatment: Rehabilitation  THERAPY DIAG:  Cervicalgia  Muscle weakness (generalized)  Pain in left arm  Abnormal posture  ONSET DATE: chronic; date of referral 03/25/23   SUBJECTIVE:                                                                                                                                                                                            SUBJECTIVE STATEMENT: Shoulder feels better today   PERTINENT HISTORY:  A-fib, CHF  PAIN:  Are you having pain? Yes: NPRS scale: 0 currently, up to 8; at best 0/10 Pain location: LUE; in forearm (does have some in Rt as well) Pain description: aching Aggravating factors: cold, activity Relieving factors: unsure  PRECAUTIONS:  None  WEIGHT BEARING RESTRICTIONS:  No  FALLS:  Has patient fallen in last 6 months? No  LIVING ENVIRONMENT: Lives with: lives alone Lives in: House/apartment Stairs: Yes, denies difficulty  OCCUPATION:  Retired from [employer]  PLOF:  Independent and Leisure: travel; no regular exercise  PATIENT GOALS:  Improve pain   OBJECTIVE:   DIAGNOSTIC FINDINGS:  X-Rays: C-spine: Loss of cervical lordosis.  Multilevel mild DDD present.  No acute fractures are visible.   Left forearm: No acute bony abnormality visible in the forearm  PATIENT SURVEYS:  04/23/23: FOTO 36 (predicted 48)  COGNITIVE STATUS: Within functional limits for tasks assessed   RED FLAGS: None    SENSATION: Reports occasional tingling in bil fingers/hands  POSTURE:  rounded shoulders, forward head, and increased thoracic kyphosis  HAND DOMINANCE:  Right  GAIT: 04/23/23 Comments: independent   Cervical and LUE  PALPATION: 04/23/23: diffuse tenderness to forearm; no specific active trigger points noted  CERVICAL ROM:   Active ROM A/PROM (deg) eval  Flexion 12  Extension 18  Right lateral flexion 6  Left lateral flexion 4  Right rotation Limited 50%  Left rotation Limited 50%   (Blank rows = not tested)   UPPER EXTREMITY ROM:  mild limitations noted in wrist AROM; did not formally measure at eval  Active ROM Right eval Left eval Rt/Lt 05/06/23  Shoulder flexion 90 90 109/125  Shoulder extension     Shoulder abduction 90 90 100/95  Shoulder adduction     Shoulder extension     Shoulder internal rotation      Shoulder external rotation      (Blank rows = not tested)   UPPER EXTREMITY MMT: Give way weakness noted; mod cues for resistance  MMT Right eval Left eval  Shoulder  flexion 3/5 3/5  Shoulder extension    Shoulder abduction 3/5 3/5  Shoulder adduction    Shoulder extension    Shoulder internal rotation 4/5 4/5  Shoulder external rotation 4/5 4/5  Wrist extension  3/5  Wrist pronation  5/5  Wrist supination  3/5  Grip strength     (Blank rows = not tested)   SPECIAL TESTS:  04/23/23: Cervical Spurling's test: Negative   TREATMENT:                                                                                                                              DATE:  05/13/23 TherEx UBE L3 x 6 min (forward only) Rows L4 band 2x10; 5 sec hold IR L4 band 2x10 bil ER L3 band 2x10 bil Supine cervical retraction 2x10; 5 sec hold Supine cervical rotation 5 sec hold; x10 bil   05/06/23 TherEx UBE L2 x 6 min (3 min each direction) Review of HEP - max cues needed as she hasn't completed since initial eval AROM measurements - see above Rows L3 band 2x10; 5 sec hold  04/23/23  See HEP - performed trial reps PRN for comprehension   PATIENT EDUCATION:  Education details: HEP Person educated: Patient Education method: Programmer, multimedia, Demonstration, and Handouts Education comprehension: verbalized understanding, returned demonstration, and needs further education  HOME EXERCISE PROGRAM: Access Code: [email or web address] URL: [email or web address] Date: 04/23/2023 Prepared by: Moshe Cipro  Exercises - Standing Wrist Flexion Stretch  - 2 x daily - 7 x weekly - 1 sets - 3 reps - 30 sec hold - Wrist Prayer Stretch  - 2 x daily - 7 x weekly - 1 sets - 3 reps - 30 sec hold - Reverse Prayer Stretch  - 2 x daily - 7 x weekly - 1 sets - 3 reps - 30 sec hold - Wrist Tendon Gliding  - 2 x daily - 7 x weekly - 1 sets - 10 reps - Wrist Flexion with Dumbbell  - 1 x daily - 7 x weekly -  2 sets - 10 reps - Wrist Extension with Dumbbell  - 1 x daily - 7 x weekly - 2 sets - 10 reps - Standing Shoulder Flexion to 90 Degrees with Dumbbells  - 1 x daily - 7 x weekly - 2 sets - 10 reps - Shoulder Abduction with Dumbbells - Thumbs Up  - 1 x daily - 7 x weekly - 2 sets - 10 reps   ASSESSMENT:  CLINICAL IMPRESSION: Pt tolerated session well today with progression of strengthening exercises.  Did trial some cervical exercises to see if this helps symptoms.  Will continue to benefit from PT to maximize function.   OBJECTIVE IMPAIRMENTS: decreased ROM, decreased strength, hypomobility, increased fascial restrictions, increased muscle spasms, impaired UE functional use, postural dysfunction, and pain.   ACTIVITY LIMITATIONS: carrying, lifting, bathing, toileting, dressing, reach over head, and hygiene/grooming  PARTICIPATION LIMITATIONS: meal prep, cleaning, laundry, driving, shopping, and  community activity  PERSONAL FACTORS: 1-2 comorbidities: CHF, a-fib  are also affecting patient's functional outcome.   REHAB POTENTIAL: Good  CLINICAL DECISION MAKING: Evolving/moderate complexity  EVALUATION COMPLEXITY: Moderate   GOALS: Goals reviewed with patient? Yes  SHORT TERM GOALS: Target date: 05/14/2023   Independent with initial HEP Goal status: MET 05/13/23  LONG TERM GOALS: Target date: 06/04/2023   Independent with final HEP Goal status: INITIAL  2.  FOTO score improved to 48 Goal status: INITIAL  3.  LUE strength improved to 4/5 for improved function with ADLs Goal status: INIITAL  4.  Report pain < 5/10 with reaching and ADLs for improved function Goal status: INITIAL    PLAN:  PT FREQUENCY: 1-2x/week  PT DURATION: 6 weeks  PLANNED INTERVENTIONS: Therapeutic exercises, Therapeutic activity, Neuromuscular re-education, Patient/Family education, Self Care, Joint mobilization, Joint manipulation, Aquatic Therapy, Dry Needling, Electrical stimulation,  Cryotherapy, Moist heat, Taping, Traction, Ultrasound, Ionotophoresis 4mg /ml Dexamethasone, Manual therapy, and Re-evaluation.  PLAN FOR NEXT SESSION: continue light strengthening/stretching; cervical/shoulder ROM as able; consider DN  NEXT MD VISIT: 06/24/23  Clarita Crane, PT, DPT 05/13/23 11:44 AM

## 2023-05-15 ENCOUNTER — Encounter: Payer: Medicare Other | Admitting: Physical Therapy

## 2023-05-16 ENCOUNTER — Other Ambulatory Visit: Payer: 59

## 2023-05-20 ENCOUNTER — Ambulatory Visit: Payer: Medicare Other | Admitting: Physical Therapy

## 2023-05-20 ENCOUNTER — Encounter: Payer: Medicare Other | Admitting: Physical Therapy

## 2023-05-20 ENCOUNTER — Other Ambulatory Visit: Payer: Medicare Other

## 2023-05-20 ENCOUNTER — Telehealth: Payer: Self-pay | Admitting: Physical Therapy

## 2023-05-20 ENCOUNTER — Encounter: Payer: Self-pay | Admitting: Physical Therapy

## 2023-05-20 DIAGNOSIS — M79602 Pain in left arm: Secondary | ICD-10-CM

## 2023-05-20 DIAGNOSIS — M542 Cervicalgia: Secondary | ICD-10-CM | POA: Diagnosis not present

## 2023-05-20 DIAGNOSIS — M6281 Muscle weakness (generalized): Secondary | ICD-10-CM

## 2023-05-20 DIAGNOSIS — R293 Abnormal posture: Secondary | ICD-10-CM

## 2023-05-20 NOTE — Telephone Encounter (Signed)
No-show for today's PT appointment. Attempted to call pt, it rang a couple of times but then call failed, unable to leave voicemail.  Nedra Hai, PT, DPT 05/20/23 9:48 AM

## 2023-05-20 NOTE — Therapy (Signed)
OUTPATIENT PHYSICAL THERAPY TREATMENT   Patient Name: Brittney Walker MRN: [id number] DOB:1951/03/03, 72 y.o., female Today's Date: 05/20/2023  END OF SESSION:  PT End of Session - 05/20/23 1104     Visit Number 4    Number of Visits 12    Date for PT Re-Evaluation 06/04/23    Progress Note Due on Visit 10    PT Start Time 1025    PT Stop Time 1105    PT Time Calculation (min) 40 min    Activity Tolerance Patient tolerated treatment well    Behavior During Therapy [facility] for tasks assessed/performed                Past Medical History:  Diagnosis Date   Atrial fibrillation (HCC) 10/04/2021   Carotid bruit    w/ neg dopplers    CHF (congestive heart failure) (HCC)    History of gastric ulcer 10/04/2021   Hyperthyroidism     s/p RAI 2004   Normocytic anemia    Persistent atrial fibrillation (HCC)    a. Dx 07/2021; b. CHA2DS2VASc = 3-->Eliquis; c. 10/2021 s/p DCCV.   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   Vitamin D deficiency    Past Surgical History:  Procedure Laterality Date   ABDOMINAL HYSTERECTOMY  11/05/1995   TAH-LEIOMYOMATA-- no BSO   BIOPSY  12/25/2021   Procedure: BIOPSY;  Surgeon: Sherrilyn Rist, MD;  Location: [facility] ENDOSCOPY;  Service: Gastroenterology;;   BIOPSY THYROID      bx of largest nodule 2005, benign   BREAST SURGERY     x's 2--fibrocystic dz   CARDIOVERSION N/A 10/10/2021   Procedure: CARDIOVERSION;  Surgeon: Thurmon Fair, MD;  Location: [facility] ENDOSCOPY;  Service: Cardiovascular;  Laterality: N/A;   CARDIOVERSION N/A 08/23/2022   Procedure: CARDIOVERSION;  Surgeon: Parke Poisson, MD;  Location: [facility] ENDOSCOPY;  Service: Cardiovascular;  Laterality: N/A;   CATARACT EXTRACTION, BILATERAL Bilateral    COLONOSCOPY WITH PROPOFOL N/A 12/25/2021   Procedure: COLONOSCOPY WITH PROPOFOL;  Surgeon: Sherrilyn Rist, MD;  Location: [facility] ENDOSCOPY;  Service: Gastroenterology;  Laterality: N/A;   ESOPHAGOGASTRODUODENOSCOPY (EGD)  WITH PROPOFOL N/A 10/12/2021   Procedure: ESOPHAGOGASTRODUODENOSCOPY (EGD) WITH PROPOFOL;  Surgeon: Sherrilyn Rist, MD;  Location: [facility] ENDOSCOPY;  Service: Gastroenterology;  Laterality: N/A;   ESOPHAGOGASTRODUODENOSCOPY (EGD) WITH PROPOFOL N/A 12/25/2021   Procedure: ESOPHAGOGASTRODUODENOSCOPY (EGD) WITH PROPOFOL;  Surgeon: Sherrilyn Rist, MD;  Location: [facility] ENDOSCOPY;  Service: Gastroenterology;  Laterality: N/A;   POLYPECTOMY  12/25/2021   Procedure: POLYPECTOMY;  Surgeon: Sherrilyn Rist, MD;  Location: [facility] ENDOSCOPY;  Service: Gastroenterology;;   TONSILLECTOMY     Patient Active Problem List   Diagnosis Date Noted   Aortic atherosclerosis (HCC) 11/21/2021   Acute diastolic heart failure (HCC) 10/17/2021   Pulmonary hypertension, unspecified (HCC) 10/17/2021   PAF (paroxysmal atrial fibrillation) (HCC)    Symptomatic anemia 10/10/2021   Heart 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)    Persistent atrial fibrillation (HCC) 07/24/2021   Secondary hypercoagulable state (HCC) 07/24/2021   Morbid obesity (HCC) 12/10/2017   PCP NTES >>>>>>>>>>>>>>>>>>>>>>>> 12/04/2016   Pain in joint, ankle and foot 12/21/2014   Annual physical exam 10/14/2014   Vitamin D deficiency 02/14/2012   GOITER, MULTINODULAR 08/24/2009   Hypothyroidism 08/24/2009   HEMANGIOMA, SKIN 06/17/2007   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    PCP: Wanda Plump, MD  REFERRING PROVIDER: Rodolph Bong, MD  REFERRING DIAG: [phone] (  ICD-10-CM) - Left arm pain M54.12 (ICD-10-CM) - Cervical radiculitis  Rationale for Evaluation and Treatment: Rehabilitation  THERAPY DIAG:  Cervicalgia  Muscle weakness (generalized)  Pain in left arm  Abnormal posture  ONSET DATE: chronic; date of referral 03/25/23   SUBJECTIVE:                                                                                                                                                                                            SUBJECTIVE STATEMENT: Pt stating her pain was about the same. Pt reporting 8/10 pain in bil shoulders and neck.    PERTINENT HISTORY:  A-fib, CHF  PAIN:  Are you having pain? Yes: NPRS scale: 0 currently, up to 8; at best 0/10 Pain location: LUE; in forearm (does have some in Rt as well) Pain description: aching Aggravating factors: cold, activity Relieving factors: unsure  PRECAUTIONS:  None  WEIGHT BEARING RESTRICTIONS:  No  FALLS:  Has patient fallen in last 6 months? No  LIVING ENVIRONMENT: Lives with: lives alone Lives in: House/apartment Stairs: Yes, denies difficulty  OCCUPATION:  Retired from [employer]  PLOF:  Independent and Leisure: travel; no regular exercise  PATIENT GOALS:  Improve pain   OBJECTIVE:   DIAGNOSTIC FINDINGS:  X-Rays: C-spine: Loss of cervical lordosis.  Multilevel mild DDD present.  No acute fractures are visible.   Left forearm: No acute bony abnormality visible in the forearm  PATIENT SURVEYS:  04/23/23: FOTO 36 (predicted 48)  COGNITIVE STATUS: Within functional limits for tasks assessed   RED FLAGS: None    SENSATION: Reports occasional tingling in bil fingers/hands  POSTURE:  rounded shoulders, forward head, and increased thoracic kyphosis  HAND DOMINANCE:  Right  GAIT: 04/23/23 Comments: independent   Cervical and LUE  PALPATION: 04/23/23: diffuse tenderness to forearm; no specific active trigger points noted  CERVICAL ROM:   Active ROM A/PROM (deg) eval  Flexion 12  Extension 18  Right lateral flexion 6  Left lateral flexion 4  Right rotation Limited 50%  Left rotation Limited 50%   (Blank rows = not tested)   UPPER EXTREMITY ROM:  mild limitations noted in wrist AROM; did not formally measure at eval  Active ROM Right eval Left eval Rt/Lt 05/06/23  Shoulder flexion 90 90 109/125  Shoulder extension     Shoulder abduction 90 90 100/95  Shoulder adduction     Shoulder extension     Shoulder  internal rotation     Shoulder external rotation      (Blank rows = not tested)   UPPER EXTREMITY MMT: Give way weakness noted; mod cues for resistance  MMT Right eval Left eval  Shoulder flexion 3/5 3/5  Shoulder extension    Shoulder abduction 3/5 3/5  Shoulder adduction    Shoulder extension    Shoulder internal rotation 4/5 4/5  Shoulder external rotation 4/5 4/5  Wrist extension  3/5  Wrist pronation  5/5  Wrist supination  3/5  Grip strength     (Blank rows = not tested)   SPECIAL TESTS:  04/23/23: Cervical Spurling's test: Negative   TREATMENT:                                                                                                                              DATE:  05/20/23 TherEx Scapular squeezes x 15 holding 5 sec Upper trap stretch: x 5 holding 10 sec bil  Sitting cervical retraction 2x10; 5 sec hold Sitting levator stretch:  5 sec hold; x 5 bil Manual:  Passive median and radial nerve flossing bil UE's IASTM to cervical spine and bilateral shoulders using Biofreeze  05/13/23 TherEx UBE L3 x 6 min (forward only) Rows L4 band 2x10; 5 sec hold IR L4 band 2x10 bil ER L3 band 2x10 bil Supine cervical retraction 2x10; 5 sec hold Supine cervical rotation 5 sec hold; x10 bil   05/06/23 TherEx UBE L2 x 6 min (3 min each direction) Review of HEP - max cues needed as she hasn't completed since initial eval AROM measurements - see above Rows L3 band 2x10; 5 sec hold  04/23/23  See HEP - performed trial reps PRN for comprehension   PATIENT EDUCATION:  Education details: HEP Person educated: Patient Education method: Programmer, multimedia, Demonstration, and Handouts Education comprehension: verbalized understanding, returned demonstration, and needs further education  HOME EXERCISE PROGRAM: Access Code: [email or web address] URL: [email or web address] Date: 04/23/2023 Prepared by: Moshe Cipro  Exercises - Standing Wrist Flexion Stretch  - 2 x  daily - 7 x weekly - 1 sets - 3 reps - 30 sec hold - Wrist Prayer Stretch  - 2 x daily - 7 x weekly - 1 sets - 3 reps - 30 sec hold - Reverse Prayer Stretch  - 2 x daily - 7 x weekly - 1 sets - 3 reps - 30 sec hold - Wrist Tendon Gliding  - 2 x daily - 7 x weekly - 1 sets - 10 reps - Wrist Flexion with Dumbbell  - 1 x daily - 7 x weekly - 2 sets - 10 reps - Wrist Extension with Dumbbell  - 1 x daily - 7 x weekly - 2 sets - 10 reps - Standing Shoulder Flexion to 90 Degrees with Dumbbells  - 1 x daily - 7 x weekly - 2 sets - 10 reps - Shoulder Abduction with Dumbbells - Thumbs Up  - 1 x daily - 7 x weekly - 2 sets - 10 reps   ASSESSMENT:  CLINICAL IMPRESSION: Pt reporting 8/10 pain upon arrival and 5/10 pain at end of session. Pt with good  response to manual stretching and STM this visit. Pt was encouraged to be consistent with her HEP for improved mobility and decreased pain. Pt was issued trial packets of Biofreeze to apply at home to help with improved comfort. Continue skilled PT interventions to maximize pt's function.    OBJECTIVE IMPAIRMENTS: decreased ROM, decreased strength, hypomobility, increased fascial restrictions, increased muscle spasms, impaired UE functional use, postural dysfunction, and pain.   ACTIVITY LIMITATIONS: carrying, lifting, bathing, toileting, dressing, reach over head, and hygiene/grooming  PARTICIPATION LIMITATIONS: meal prep, cleaning, laundry, driving, shopping, and community activity  PERSONAL FACTORS: 1-2 comorbidities: CHF, a-fib  are also affecting patient's functional outcome.   REHAB POTENTIAL: Good  CLINICAL DECISION MAKING: Evolving/moderate complexity  EVALUATION COMPLEXITY: Moderate   GOALS: Goals reviewed with patient? Yes  SHORT TERM GOALS: Target date: 05/14/2023   Independent with initial HEP Goal status: MET 05/13/23  LONG TERM GOALS: Target date: 06/04/2023   Independent with final HEP Goal status: On-going 05/20/23  2.  FOTO  score improved to 48 Goal status: On-going 05/20/23  3.  LUE strength improved to 4/5 for improved function with ADLs Goal status: On-going 05/20/23  4.  Report pain < 5/10 with reaching and ADLs for improved function Goal status: On-going 05/30/23    PLAN:  PT FREQUENCY: 1-2x/week  PT DURATION: 6 weeks  PLANNED INTERVENTIONS: Therapeutic exercises, Therapeutic activity, Neuromuscular re-education, Patient/Family education, Self Care, Joint mobilization, Joint manipulation, Aquatic Therapy, Dry Needling, Electrical stimulation, Cryotherapy, Moist heat, Taping, Traction, Ultrasound, Ionotophoresis 4mg /ml Dexamethasone, Manual therapy, and Re-evaluation.  PLAN FOR NEXT SESSION: manual therapy as needed,  continue light strengthening/stretching; cervical/shoulder ROM as able; consider DN  NEXT MD VISIT: 06/24/23  Narda Amber, PT, MPT 05/20/23 11:22 AM   05/20/23 11:07 AM

## 2023-05-23 ENCOUNTER — Telehealth: Payer: Self-pay | Admitting: Physical Therapy

## 2023-05-23 ENCOUNTER — Encounter: Payer: Medicare Other | Admitting: Physical Therapy

## 2023-05-23 NOTE — Telephone Encounter (Signed)
No-show for today's PT appointment. Tried to call patient but call could not be completed as dialed.  Nedra Hai, PT, DPT 05/23/23 9:48 AM

## 2023-05-26 ENCOUNTER — Ambulatory Visit (INDEPENDENT_AMBULATORY_CARE_PROVIDER_SITE_OTHER): Payer: Medicare Other | Admitting: Physical Therapy

## 2023-05-26 ENCOUNTER — Other Ambulatory Visit (INDEPENDENT_AMBULATORY_CARE_PROVIDER_SITE_OTHER): Payer: Medicare Other

## 2023-05-26 ENCOUNTER — Encounter: Payer: Self-pay | Admitting: Physical Therapy

## 2023-05-26 DIAGNOSIS — M542 Cervicalgia: Secondary | ICD-10-CM

## 2023-05-26 DIAGNOSIS — R293 Abnormal posture: Secondary | ICD-10-CM | POA: Diagnosis not present

## 2023-05-26 DIAGNOSIS — M79602 Pain in left arm: Secondary | ICD-10-CM | POA: Diagnosis not present

## 2023-05-26 DIAGNOSIS — E785 Hyperlipidemia, unspecified: Secondary | ICD-10-CM | POA: Diagnosis not present

## 2023-05-26 DIAGNOSIS — M6281 Muscle weakness (generalized): Secondary | ICD-10-CM

## 2023-05-26 LAB — LIPID PANEL
Cholesterol: 200 mg/dL (ref 0–200)
HDL: 56.7 mg/dL (ref >39.00)
LDL Cholesterol: 123 mg/dL — ABNORMAL HIGH (ref 0–99)
NonHDL: 143.23
Total CHOL/HDL Ratio: 4
Triglycerides: 100 mg/dL (ref 0.0–149.0)
VLDL: 20 mg/dL (ref 0.0–40.0)

## 2023-05-26 LAB — AST: AST: 15 U/L (ref 0–37)

## 2023-05-26 LAB — ALT: ALT: 12 U/L (ref 0–35)

## 2023-05-26 NOTE — Therapy (Signed)
OUTPATIENT PHYSICAL THERAPY TREATMENT   Patient Name: Brittney Walker MRN: [id number] DOB:July 18, 1951, 72 y.o., female Today's Date: 05/26/2023  END OF SESSION:  PT End of Session - 05/26/23 0942     Visit Number 5    Number of Visits 12    Date for PT Re-Evaluation 06/04/23    Authorization Type [employer] Medicare    Progress Note Due on Visit 10    PT Start Time 0940   pt arrived late   Activity Tolerance Patient tolerated treatment well    Behavior During Therapy [facility] for tasks assessed/performed                 Past Medical History:  Diagnosis Date   Atrial fibrillation (HCC) 10/04/2021   Carotid bruit    w/ neg dopplers    CHF (congestive heart failure) (HCC)    History of gastric ulcer 10/04/2021   Hyperthyroidism     s/p RAI 2004   Normocytic anemia    Persistent atrial fibrillation (HCC)    a. Dx 07/2021; b. CHA2DS2VASc = 3-->Eliquis; c. 10/2021 s/p DCCV.   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   Vitamin D deficiency    Past Surgical History:  Procedure Laterality Date   ABDOMINAL HYSTERECTOMY  11/05/1995   TAH-LEIOMYOMATA-- no BSO   BIOPSY  12/25/2021   Procedure: BIOPSY;  Surgeon: Sherrilyn Rist, MD;  Location: [facility] ENDOSCOPY;  Service: Gastroenterology;;   BIOPSY THYROID      bx of largest nodule 2005, benign   BREAST SURGERY     x's 2--fibrocystic dz   CARDIOVERSION N/A 10/10/2021   Procedure: CARDIOVERSION;  Surgeon: Thurmon Fair, MD;  Location: [facility] ENDOSCOPY;  Service: Cardiovascular;  Laterality: N/A;   CARDIOVERSION N/A 08/23/2022   Procedure: CARDIOVERSION;  Surgeon: Parke Poisson, MD;  Location: [facility] ENDOSCOPY;  Service: Cardiovascular;  Laterality: N/A;   CATARACT EXTRACTION, BILATERAL Bilateral    COLONOSCOPY WITH PROPOFOL N/A 12/25/2021   Procedure: COLONOSCOPY WITH PROPOFOL;  Surgeon: Sherrilyn Rist, MD;  Location: [facility] ENDOSCOPY;  Service: Gastroenterology;  Laterality: N/A;   ESOPHAGOGASTRODUODENOSCOPY (EGD) WITH  PROPOFOL N/A 10/12/2021   Procedure: ESOPHAGOGASTRODUODENOSCOPY (EGD) WITH PROPOFOL;  Surgeon: Sherrilyn Rist, MD;  Location: [facility] ENDOSCOPY;  Service: Gastroenterology;  Laterality: N/A;   ESOPHAGOGASTRODUODENOSCOPY (EGD) WITH PROPOFOL N/A 12/25/2021   Procedure: ESOPHAGOGASTRODUODENOSCOPY (EGD) WITH PROPOFOL;  Surgeon: Sherrilyn Rist, MD;  Location: [facility] ENDOSCOPY;  Service: Gastroenterology;  Laterality: N/A;   POLYPECTOMY  12/25/2021   Procedure: POLYPECTOMY;  Surgeon: Sherrilyn Rist, MD;  Location: [facility] ENDOSCOPY;  Service: Gastroenterology;;   TONSILLECTOMY     Patient Active Problem List   Diagnosis Date Noted   Aortic atherosclerosis (HCC) 11/21/2021   Acute diastolic heart failure (HCC) 10/17/2021   Pulmonary hypertension, unspecified (HCC) 10/17/2021   PAF (paroxysmal atrial fibrillation) (HCC)    Symptomatic anemia 10/10/2021   Heart 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)    Persistent atrial fibrillation (HCC) 07/24/2021   Secondary hypercoagulable state (HCC) 07/24/2021   Morbid obesity (HCC) 12/10/2017   PCP NTES >>>>>>>>>>>>>>>>>>>>>>>> 12/04/2016   Pain in joint, ankle and foot 12/21/2014   Annual physical exam 10/14/2014   Vitamin D deficiency 02/14/2012   GOITER, MULTINODULAR 08/24/2009   Hypothyroidism 08/24/2009   HEMANGIOMA, SKIN 06/17/2007   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    PCP: Wanda Plump, MD  REFERRING PROVIDER: Rodolph Bong, MD  REFERRING DIAG: [phone] (ICD-10-CM) - Left arm  pain M54.12 (ICD-10-CM) - Cervical radiculitis  Rationale for Evaluation and Treatment: Rehabilitation  THERAPY DIAG:  Cervicalgia  Muscle weakness (generalized)  Pain in left arm  Abnormal posture  ONSET DATE: chronic; date of referral 03/25/23   SUBJECTIVE:                                                                                                                                                                                            SUBJECTIVE STATEMENT: Pain felt much better after last session, lasted a couple of days. Doing "some of them" for her exercises.   PERTINENT HISTORY:  A-fib, CHF  PAIN:  Are you having pain? Yes: NPRS scale: 0 currently, up to 6; at best 0/10 Pain location: LUE; in forearm (does have some in Rt as well) Pain description: aching Aggravating factors: cold, activity Relieving factors: unsure  PRECAUTIONS:  None  WEIGHT BEARING RESTRICTIONS:  No  FALLS:  Has patient fallen in last 6 months? No  LIVING ENVIRONMENT: Lives with: lives alone Lives in: House/apartment Stairs: Yes, denies difficulty  OCCUPATION:  Retired from [employer]  PLOF:  Independent and Leisure: travel; no regular exercise  PATIENT GOALS:  Improve pain   OBJECTIVE:   DIAGNOSTIC FINDINGS:  X-Rays: C-spine: Loss of cervical lordosis.  Multilevel mild DDD present.  No acute fractures are visible.   Left forearm: No acute bony abnormality visible in the forearm  PATIENT SURVEYS:  04/23/23: FOTO 36 (predicted 48)  COGNITIVE STATUS: Within functional limits for tasks assessed   RED FLAGS: None    SENSATION: Reports occasional tingling in bil fingers/hands  POSTURE:  rounded shoulders, forward head, and increased thoracic kyphosis  HAND DOMINANCE:  Right  GAIT: 04/23/23 Comments: independent   Cervical and LUE  PALPATION: 04/23/23: diffuse tenderness to forearm; no specific active trigger points noted  CERVICAL ROM:   Active ROM A/PROM (deg) eval  Flexion 12  Extension 18  Right lateral flexion 6  Left lateral flexion 4  Right rotation Limited 50%  Left rotation Limited 50%   (Blank rows = not tested)   UPPER EXTREMITY ROM:  mild limitations noted in wrist AROM; did not formally measure at eval  Active ROM Right eval Left eval Rt/Lt 05/06/23  Shoulder flexion 90 90 109/125  Shoulder extension     Shoulder abduction 90 90 100/95  Shoulder adduction     Shoulder extension      Shoulder internal rotation     Shoulder external rotation      (Blank rows = not tested)   UPPER EXTREMITY MMT: Give way weakness noted; mod cues for resistance  MMT Right  eval Left eval  Shoulder flexion 3/5 3/5  Shoulder extension    Shoulder abduction 3/5 3/5  Shoulder adduction    Shoulder extension    Shoulder internal rotation 4/5 4/5  Shoulder external rotation 4/5 4/5  Wrist extension  3/5  Wrist pronation  5/5  Wrist supination  3/5  Grip strength     (Blank rows = not tested)   SPECIAL TESTS:  04/23/23: Cervical Spurling's test: Negative   TREATMENT:                                                                                                                              DATE:  05/26/23 Manual:  Passive median and radial nerve flossing bil UE's IASTM to cervical spine and bilateral shoulders using Biofreeze  TherEx Scapular squeezes x 15 holding 5 sec Bil shoulder ER with scapular retraction x 15 reps; 5 sec hold Demonstrated nerve flossing bil UEs for home  05/20/23 TherEx Scapular squeezes x 15 holding 5 sec Upper trap stretch: x 5 holding 10 sec bil  Sitting cervical retraction 2x10; 5 sec hold Sitting levator stretch:  5 sec hold; x 5 bil Manual:  Passive median and radial nerve flossing bil UE's IASTM to cervical spine and bilateral shoulders using Biofreeze  05/13/23 TherEx UBE L3 x 6 min (forward only) Rows L4 band 2x10; 5 sec hold IR L4 band 2x10 bil ER L3 band 2x10 bil Supine cervical retraction 2x10; 5 sec hold Supine cervical rotation 5 sec hold; x10 bil   05/06/23 TherEx UBE L2 x 6 min (3 min each direction) Review of HEP - max cues needed as she hasn't completed since initial eval AROM measurements - see above Rows L3 band 2x10; 5 sec hold  04/23/23  See HEP - performed trial reps PRN for comprehension   PATIENT EDUCATION:  Education details: HEP Person educated: Patient Education method: Programmer, multimedia, Demonstration, and  Handouts Education comprehension: verbalized understanding, returned demonstration, and needs further education  HOME EXERCISE PROGRAM: Access Code: [email or web address] URL: [email or web address] Date: 04/23/2023 Prepared by: Moshe Cipro  Exercises - Standing Wrist Flexion Stretch  - 2 x daily - 7 x weekly - 1 sets - 3 reps - 30 sec hold - Wrist Prayer Stretch  - 2 x daily - 7 x weekly - 1 sets - 3 reps - 30 sec hold - Reverse Prayer Stretch  - 2 x daily - 7 x weekly - 1 sets - 3 reps - 30 sec hold - Wrist Tendon Gliding  - 2 x daily - 7 x weekly - 1 sets - 10 reps - Wrist Flexion with Dumbbell  - 1 x daily - 7 x weekly - 2 sets - 10 reps - Wrist Extension with Dumbbell  - 1 x daily - 7 x weekly - 2 sets - 10 reps - Standing Shoulder Flexion to 90 Degrees with Dumbbells  - 1 x daily - 7 x weekly - 2 sets -  10 reps - Shoulder Abduction with Dumbbells - Thumbs Up  - 1 x daily - 7 x weekly - 2 sets - 10 reps   ASSESSMENT:  CLINICAL IMPRESSION: Pt reporting improvement in symptoms after last visit so continued similar session, but pt arriving late so limited session.  Will continue to benefit from PT to maximize function.   OBJECTIVE IMPAIRMENTS: decreased ROM, decreased strength, hypomobility, increased fascial restrictions, increased muscle spasms, impaired UE functional use, postural dysfunction, and pain.   ACTIVITY LIMITATIONS: carrying, lifting, bathing, toileting, dressing, reach over head, and hygiene/grooming  PARTICIPATION LIMITATIONS: meal prep, cleaning, laundry, driving, shopping, and community activity  PERSONAL FACTORS: 1-2 comorbidities: CHF, a-fib  are also affecting patient's functional outcome.   REHAB POTENTIAL: Good  CLINICAL DECISION MAKING: Evolving/moderate complexity  EVALUATION COMPLEXITY: Moderate   GOALS: Goals reviewed with patient? Yes  SHORT TERM GOALS: Target date: 05/14/2023   Independent with initial HEP Goal status: MET  05/13/23  LONG TERM GOALS: Target date: 06/04/2023   Independent with final HEP Goal status: On-going 05/20/23  2.  FOTO score improved to 48 Goal status: On-going 05/20/23  3.  LUE strength improved to 4/5 for improved function with ADLs Goal status: On-going 05/20/23  4.  Report pain < 5/10 with reaching and ADLs for improved function Goal status: On-going 05/30/23    PLAN:  PT FREQUENCY: 1-2x/week  PT DURATION: 6 weeks  PLANNED INTERVENTIONS: Therapeutic exercises, Therapeutic activity, Neuromuscular re-education, Patient/Family education, Self Care, Joint mobilization, Joint manipulation, Aquatic Therapy, Dry Needling, Electrical stimulation, Cryotherapy, Moist heat, Taping, Traction, Ultrasound, Ionotophoresis 4mg /ml Dexamethasone, Manual therapy, and Re-evaluation.  PLAN FOR NEXT SESSION: manual therapy as needed,  continue light strengthening/stretching; cervical/shoulder ROM as able; consider DN  NEXT MD VISIT: 06/24/23    Clarita Crane, PT, DPT 05/26/23 9:42 AM

## 2023-05-27 NOTE — Telephone Encounter (Signed)
Requested patient be billed for [employer]. Jim Like MHA RN CCM

## 2023-05-28 ENCOUNTER — Encounter: Payer: Medicare Other | Admitting: Physical Therapy

## 2023-05-29 ENCOUNTER — Encounter: Payer: Self-pay | Admitting: Physical Therapy

## 2023-05-29 ENCOUNTER — Ambulatory Visit (INDEPENDENT_AMBULATORY_CARE_PROVIDER_SITE_OTHER): Payer: Medicare Other | Admitting: Physical Therapy

## 2023-05-29 DIAGNOSIS — M79602 Pain in left arm: Secondary | ICD-10-CM

## 2023-05-29 DIAGNOSIS — M6281 Muscle weakness (generalized): Secondary | ICD-10-CM

## 2023-05-29 DIAGNOSIS — M542 Cervicalgia: Secondary | ICD-10-CM | POA: Diagnosis not present

## 2023-05-29 DIAGNOSIS — R293 Abnormal posture: Secondary | ICD-10-CM | POA: Diagnosis not present

## 2023-05-29 NOTE — Therapy (Addendum)
OUTPATIENT PHYSICAL THERAPY TREATMENT / DISCHARGE   Patient Name: Brittney Walker MRN: [id number] DOB:15-Jan-1951, 72 y.o., female Today's Date: 05/29/2023  END OF SESSION:  PT End of Session - 05/29/23 1029     Visit Number 6    Number of Visits 12    Date for PT Re-Evaluation 06/04/23    Authorization Type [employer] Medicare    Progress Note Due on Visit 10    PT Start Time 1025   pt arrived late   PT Stop Time 1055    PT Time Calculation (min) 30 min    Activity Tolerance Patient tolerated treatment well    Behavior During Therapy [facility] for tasks assessed/performed                  Past Medical History:  Diagnosis Date   Atrial fibrillation (HCC) 10/04/2021   Carotid bruit    w/ neg dopplers    CHF (congestive heart failure) (HCC)    History of gastric ulcer 10/04/2021   Hyperthyroidism     s/p RAI 2004   Normocytic anemia    Persistent atrial fibrillation (HCC)    a. Dx 07/2021; b. CHA2DS2VASc = 3-->Eliquis; c. 10/2021 s/p DCCV.   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   Vitamin D deficiency    Past Surgical History:  Procedure Laterality Date   ABDOMINAL HYSTERECTOMY  11/05/1995   TAH-LEIOMYOMATA-- no BSO   BIOPSY  12/25/2021   Procedure: BIOPSY;  Surgeon: Sherrilyn Rist, MD;  Location: [facility] ENDOSCOPY;  Service: Gastroenterology;;   BIOPSY THYROID      bx of largest nodule 2005, benign   BREAST SURGERY     x's 2--fibrocystic dz   CARDIOVERSION N/A 10/10/2021   Procedure: CARDIOVERSION;  Surgeon: Thurmon Fair, MD;  Location: [facility] ENDOSCOPY;  Service: Cardiovascular;  Laterality: N/A;   CARDIOVERSION N/A 08/23/2022   Procedure: CARDIOVERSION;  Surgeon: Parke Poisson, MD;  Location: [facility] ENDOSCOPY;  Service: Cardiovascular;  Laterality: N/A;   CATARACT EXTRACTION, BILATERAL Bilateral    COLONOSCOPY WITH PROPOFOL N/A 12/25/2021   Procedure: COLONOSCOPY WITH PROPOFOL;  Surgeon: Sherrilyn Rist, MD;  Location: [facility] ENDOSCOPY;  Service:  Gastroenterology;  Laterality: N/A;   ESOPHAGOGASTRODUODENOSCOPY (EGD) WITH PROPOFOL N/A 10/12/2021   Procedure: ESOPHAGOGASTRODUODENOSCOPY (EGD) WITH PROPOFOL;  Surgeon: Sherrilyn Rist, MD;  Location: [facility] ENDOSCOPY;  Service: Gastroenterology;  Laterality: N/A;   ESOPHAGOGASTRODUODENOSCOPY (EGD) WITH PROPOFOL N/A 12/25/2021   Procedure: ESOPHAGOGASTRODUODENOSCOPY (EGD) WITH PROPOFOL;  Surgeon: Sherrilyn Rist, MD;  Location: [facility] ENDOSCOPY;  Service: Gastroenterology;  Laterality: N/A;   POLYPECTOMY  12/25/2021   Procedure: POLYPECTOMY;  Surgeon: Sherrilyn Rist, MD;  Location: [facility] ENDOSCOPY;  Service: Gastroenterology;;   TONSILLECTOMY     Patient Active Problem List   Diagnosis Date Noted   Aortic atherosclerosis (HCC) 11/21/2021   Acute diastolic heart failure (HCC) 10/17/2021   Pulmonary hypertension, unspecified (HCC) 10/17/2021   PAF (paroxysmal atrial fibrillation) (HCC)    Symptomatic anemia 10/10/2021   Heart 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)    Persistent atrial fibrillation (HCC) 07/24/2021   Secondary hypercoagulable state (HCC) 07/24/2021   Morbid obesity (HCC) 12/10/2017   PCP NTES >>>>>>>>>>>>>>>>>>>>>>>> 12/04/2016   Pain in joint, ankle and foot 12/21/2014   Annual physical exam 10/14/2014   Vitamin D deficiency 02/14/2012   GOITER, MULTINODULAR 08/24/2009   Hypothyroidism 08/24/2009   HEMANGIOMA, SKIN 06/17/2007   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    PCP:  Wanda Plump, MD  REFERRING PROVIDER: Rodolph Bong, MD  REFERRING DIAG: [phone] (ICD-10-CM) - Left arm pain M54.12 (ICD-10-CM) - Cervical radiculitis  Rationale for Evaluation and Treatment: Rehabilitation  THERAPY DIAG:  Cervicalgia  Muscle weakness (generalized)  Pain in left arm  Abnormal posture  ONSET DATE: chronic; date of referral 03/25/23   SUBJECTIVE:                                                                                                                                                                                            SUBJECTIVE STATEMENT: Pain is a 0 when she leaves here; but slowly increases; has been better the past few days.   PERTINENT HISTORY:  A-fib, CHF  PAIN:  Are you having pain? Yes: NPRS scale: 0 currently, up to 3; at best 0/10 Pain location: LUE; in forearm (does have some in Rt as well) Pain description: aching Aggravating factors: cold, activity Relieving factors: unsure  PRECAUTIONS:  None  WEIGHT BEARING RESTRICTIONS:  No  FALLS:  Has patient fallen in last 6 months? No  LIVING ENVIRONMENT: Lives with: lives alone Lives in: House/apartment Stairs: Yes, denies difficulty  OCCUPATION:  Retired from [employer]  PLOF:  Independent and Leisure: travel; no regular exercise  PATIENT GOALS:  Improve pain   OBJECTIVE:   DIAGNOSTIC FINDINGS:  X-Rays: C-spine: Loss of cervical lordosis.  Multilevel mild DDD present.  No acute fractures are visible.   Left forearm: No acute bony abnormality visible in the forearm  PATIENT SURVEYS:  04/23/23: FOTO 36 (predicted 48)  COGNITIVE STATUS: Within functional limits for tasks assessed   RED FLAGS: None    SENSATION: Reports occasional tingling in bil fingers/hands  POSTURE:  rounded shoulders, forward head, and increased thoracic kyphosis  HAND DOMINANCE:  Right  GAIT: 04/23/23 Comments: independent   Cervical and LUE  PALPATION: 04/23/23: diffuse tenderness to forearm; no specific active trigger points noted  CERVICAL ROM:   Active ROM A/PROM (deg) eval  Flexion 12  Extension 18  Right lateral flexion 6  Left lateral flexion 4  Right rotation Limited 50%  Left rotation Limited 50%   (Blank rows = not tested)   UPPER EXTREMITY ROM:  mild limitations noted in wrist AROM; did not formally measure at eval  Active ROM Right eval Left eval Rt/Lt 05/06/23 Rt/Lt 05/29/23  Shoulder flexion 90 90 109/125 120/117  Shoulder extension       Shoulder abduction 90 90 100/95 110/105  Shoulder adduction      Shoulder extension      Shoulder internal rotation      Shoulder external rotation       (  Blank rows = not tested)   UPPER EXTREMITY MMT: Give way weakness noted; mod cues for resistance  MMT Right eval Left eval  Shoulder flexion 3/5 3/5  Shoulder extension    Shoulder abduction 3/5 3/5  Shoulder adduction    Shoulder extension    Shoulder internal rotation 4/5 4/5  Shoulder external rotation 4/5 4/5  Wrist extension  3/5  Wrist pronation  5/5  Wrist supination  3/5  Grip strength     (Blank rows = not tested)   SPECIAL TESTS:  04/23/23: Cervical Spurling's test: Negative   TREATMENT:                                                                                                                              DATE:  05/29/23 TherEx UBE L5 x 5 min fwd Rows L4 band 2x10; 5 sec hold Shoulder extension L4 band 2x10 Bil ER with scap retraction with L3 band 2x10 Median nerve flossing 10 x 10 sec hold bil ROM measurements - see above for details  Manual:  IASTM with percussive device to cervical spine and bilateral shoulders   05/26/23 Manual:  Passive median and radial nerve flossing bil UE's IASTM to cervical spine and bilateral shoulders using Biofreeze  TherEx Scapular squeezes x 15 holding 5 sec Bil shoulder ER with scapular retraction x 15 reps; 5 sec hold Demonstrated nerve flossing bil UEs for home  05/20/23 TherEx Scapular squeezes x 15 holding 5 sec Upper trap stretch: x 5 holding 10 sec bil  Sitting cervical retraction 2x10; 5 sec hold Sitting levator stretch:  5 sec hold; x 5 bil Manual:  Passive median and radial nerve flossing bil UE's IASTM to cervical spine and bilateral shoulders using Biofreeze  05/13/23 TherEx UBE L3 x 6 min (forward only) Rows L4 band 2x10; 5 sec hold IR L4 band 2x10 bil ER L3 band 2x10 bil Supine cervical retraction 2x10; 5 sec hold Supine cervical  rotation 5 sec hold; x10 bil   05/06/23 TherEx UBE L2 x 6 min (3 min each direction) Review of HEP - max cues needed as she hasn't completed since initial eval AROM measurements - see above Rows L3 band 2x10; 5 sec hold  04/23/23  See HEP - performed trial reps PRN for comprehension   PATIENT EDUCATION:  Education details: HEP Person educated: Patient Education method: Programmer, multimedia, Demonstration, and Handouts Education comprehension: verbalized understanding, returned demonstration, and needs further education  HOME EXERCISE PROGRAM: Access Code: [email or web address] URL: [email or web address] Date: 04/23/2023 Prepared by: Moshe Cipro  Exercises - Standing Wrist Flexion Stretch  - 2 x daily - 7 x weekly - 1 sets - 3 reps - 30 sec hold - Wrist Prayer Stretch  - 2 x daily - 7 x weekly - 1 sets - 3 reps - 30 sec hold - Reverse Prayer Stretch  - 2 x daily - 7 x weekly - 1 sets - 3 reps - 30 sec hold -  Wrist Tendon Gliding  - 2 x daily - 7 x weekly - 1 sets - 10 reps - Wrist Flexion with Dumbbell  - 1 x daily - 7 x weekly - 2 sets - 10 reps - Wrist Extension with Dumbbell  - 1 x daily - 7 x weekly - 2 sets - 10 reps - Standing Shoulder Flexion to 90 Degrees with Dumbbells  - 1 x daily - 7 x weekly - 2 sets - 10 reps - Shoulder Abduction with Dumbbells - Thumbs Up  - 1 x daily - 7 x weekly - 2 sets - 10 reps   ASSESSMENT:  CLINICAL IMPRESSION: Pt tolerated session well today and overall pain is improving with exercise.  AROM slowly improving as well in most areas.  Likely ready for transition to HEP next week.   OBJECTIVE IMPAIRMENTS: decreased ROM, decreased strength, hypomobility, increased fascial restrictions, increased muscle spasms, impaired UE functional use, postural dysfunction, and pain.   ACTIVITY LIMITATIONS: carrying, lifting, bathing, toileting, dressing, reach over head, and hygiene/grooming  PARTICIPATION LIMITATIONS: meal prep, cleaning, laundry,  driving, shopping, and community activity  PERSONAL FACTORS: 1-2 comorbidities: CHF, a-fib  are also affecting patient's functional outcome.   REHAB POTENTIAL: Good  CLINICAL DECISION MAKING: Evolving/moderate complexity  EVALUATION COMPLEXITY: Moderate   GOALS: Goals reviewed with patient? Yes  SHORT TERM GOALS: Target date: 05/14/2023   Independent with initial HEP Goal status: MET 05/13/23  LONG TERM GOALS: Target date: 06/04/2023   Independent with final HEP Goal status: On-going 05/20/23  2.  FOTO score improved to 48 Goal status: On-going 05/20/23  3.  LUE strength improved to 4/5 for improved function with ADLs Goal status: On-going 05/20/23  4.  Report pain < 5/10 with reaching and ADLs for improved function Goal status: On-going 05/30/23    PLAN:  PT FREQUENCY: 1-2x/week  PT DURATION: 6 weeks  PLANNED INTERVENTIONS: Therapeutic exercises, Therapeutic activity, Neuromuscular re-education, Patient/Family education, Self Care, Joint mobilization, Joint manipulation, Aquatic Therapy, Dry Needling, Electrical stimulation, Cryotherapy, Moist heat, Taping, Traction, Ultrasound, Ionotophoresis 4mg /ml Dexamethasone, Manual therapy, and Re-evaluation.  PLAN FOR NEXT SESSION: possible transition to HEP only, manual therapy as needed,  continue light strengthening/stretching; cervical/shoulder ROM as able; consider DN  NEXT MD VISIT: 06/24/23    Clarita Crane, PT, DPT 05/29/23 11:02 AM    PHYSICAL THERAPY DISCHARGE SUMMARY  Visits from Start of Care: 6  Current functional level related to goals / functional outcomes: See note   Remaining deficits: See note   Education / Equipment: HEP  Patient goals were partially met. Patient is being discharged due to not returning since the last visit.  Chyrel Masson, PT, DPT, OCS, ATC 07/16/23  3:54 PM

## 2023-06-02 ENCOUNTER — Encounter: Payer: Medicare Other | Admitting: Physical Therapy

## 2023-06-04 ENCOUNTER — Encounter: Payer: Medicare Other | Admitting: Physical Therapy

## 2023-06-09 ENCOUNTER — Encounter: Payer: Self-pay | Admitting: *Deleted

## 2023-06-11 ENCOUNTER — Telehealth: Payer: Self-pay | Admitting: *Deleted

## 2023-06-11 NOTE — Telephone Encounter (Signed)
Patient finally got back with [place] about labs from 05/26/23.  She stated that she has not been taking medication, the atorvastatin 20mg .  I asked her for about how long and she stated about 1 week.  She has been having side effects but when asked she stated that it has been "messing her up".  I tried several times try and get more details but I just kept getting "it messing with her".  I asked her about was she having pain in her legs and she stated " in my legs, head, whole body".

## 2023-06-11 NOTE — Telephone Encounter (Signed)
Call patient Cholesterol unchanged, increase atorvastatin to 40 mg daily. Send RX.  FLP AST ALT in 6 weeks

## 2023-06-12 NOTE — Telephone Encounter (Signed)
Okay, will reassess at the next visit

## 2023-06-18 ENCOUNTER — Encounter (HOSPITAL_COMMUNITY): Payer: Self-pay

## 2023-06-18 ENCOUNTER — Emergency Department (HOSPITAL_COMMUNITY)
Admission: EM | Admit: 2023-06-18 | Discharge: 2023-06-19 | Disposition: A | Discharge status: Home / Self Care | Payer: Medicare Other | Attending: Emergency Medicine | Admitting: Emergency Medicine

## 2023-06-18 ENCOUNTER — Other Ambulatory Visit: Payer: Self-pay

## 2023-06-18 DIAGNOSIS — R609 Edema, unspecified: Secondary | ICD-10-CM

## 2023-06-18 DIAGNOSIS — R600 Localized edema: Secondary | ICD-10-CM | POA: Insufficient documentation

## 2023-06-18 DIAGNOSIS — I509 Heart failure, unspecified: Secondary | ICD-10-CM | POA: Insufficient documentation

## 2023-06-18 DIAGNOSIS — Z760 Encounter for issue of repeat prescription: Secondary | ICD-10-CM | POA: Diagnosis not present

## 2023-06-18 LAB — CBC WITH DIFFERENTIAL/PLATELET
Abs Immature Granulocytes: 0.02 10*3/uL (ref 0.00–0.07)
Basophils Absolute: 0 10*3/uL (ref 0.0–0.1)
Basophils Relative: 0 %
Eosinophils Absolute: 0.1 10*3/uL (ref 0.0–0.5)
Eosinophils Relative: 2 %
HCT: 36.9 % (ref 36.0–46.0)
Hemoglobin: 12.2 g/dL (ref 12.0–15.0)
Immature Granulocytes: 0 %
Lymphocytes Relative: 28 %
Lymphs Abs: 1.5 10*3/uL (ref 0.7–4.0)
MCH: 31 pg (ref 26.0–34.0)
MCHC: 33.1 g/dL (ref 30.0–36.0)
MCV: 93.7 fL (ref 80.0–100.0)
Monocytes Absolute: 0.5 10*3/uL (ref 0.1–1.0)
Monocytes Relative: 10 %
Neutro Abs: 3.1 10*3/uL (ref 1.7–7.7)
Neutrophils Relative %: 60 %
Platelets: 132 10*3/uL — ABNORMAL LOW (ref 150–400)
RBC: 3.94 MIL/uL (ref 3.87–5.11)
RDW: 12.4 % (ref 11.5–15.5)
WBC: 5.3 10*3/uL (ref 4.0–10.5)
nRBC: 0 % (ref 0.0–0.2)

## 2023-06-18 LAB — COMPREHENSIVE METABOLIC PANEL
ALT: 18 U/L (ref 0–44)
AST: 20 U/L (ref 15–41)
Albumin: 3.7 g/dL (ref 3.5–5.0)
Alkaline Phosphatase: 52 U/L (ref 38–126)
Anion gap: 6 (ref 5–15)
BUN: 17 mg/dL (ref 8–23)
CO2: 26 mmol/L (ref 22–32)
Calcium: 9 mg/dL (ref 8.9–10.3)
Chloride: 107 mmol/L (ref 98–111)
Creatinine, Ser: 0.89 mg/dL (ref 0.44–1.00)
GFR, Estimated: >60 mL/min (ref >60)
Glucose, Bld: 91 mg/dL (ref 70–99)
Potassium: 3.8 mmol/L (ref 3.5–5.1)
Sodium: 139 mmol/L (ref 135–145)
Total Bilirubin: 0.4 mg/dL (ref 0.3–1.2)
Total Protein: 7.4 g/dL (ref 6.5–8.1)

## 2023-06-18 LAB — BRAIN NATRIURETIC PEPTIDE: B Natriuretic Peptide: 159 pg/mL — ABNORMAL HIGH (ref 0.0–100.0)

## 2023-06-18 NOTE — ED Triage Notes (Signed)
Pt reports not having her lasix 40 mg daily since last Thursday. Pt had some issues with the pharmacy. Pt reports having swelling all over.

## 2023-06-19 MED ORDER — FUROSEMIDE 40 MG PO TABS
40.0000 mg | ORAL_TABLET | Freq: Once | ORAL | Status: AC
  Administered 2023-06-19: 40 mg via ORAL
  Filled 2023-06-19: qty 1

## 2023-06-19 NOTE — Discharge Instructions (Signed)
Take your Lasix as prescribed.  Follow up with your doctor as needed.

## 2023-06-19 NOTE — ED Provider Notes (Signed)
Brittney Walker EMERGENCY DEPARTMENT AT [facility] Provider Note   CSN: [id number] Arrival date & time: 06/18/23  2216     History  Chief Complaint  Patient presents with   Medication Refill   Edema    Brittney Walker is a 72 y.o. female.  72 year old female with history of CHF presents with request for dose of Lasix. States she ran out of her Lasix on Thursday.  She went to her pharmacy to pick up her prescription but they had accidentally refilled her potassium pills instead of her Lasix.  Patient states that she has a new prescription waiting for her at the pharmacy but was concerned due to the swelling in her feet that she should come to the emergency room for a fluid pill before the fluid gets to her lungs.  She denies chest pain or shortness of breath.  No other complaints or concerns today.       Home Medications Prior to Admission medications   Medication Sig Start Date End Date Taking? Authorizing Provider  Ascorbic Acid (VITAMIN C) 1000 MG tablet Take 1,000 mg by mouth daily.    [provider]  atorvastatin (LIPITOR) 20 MG tablet Take 1 tablet (20 mg total) by mouth at bedtime. 03/25/23   Wanda Plump, MD  calcium carbonate (OS-CAL - DOSED IN MG OF ELEMENTAL CALCIUM) 1250 MG tablet Take 1,250 mg by mouth daily with breakfast.    [provider]  cholecalciferol (VITAMIN D) 1000 UNITS tablet Take 1,000 Units by mouth daily.    [provider]  ELIQUIS 5 MG TABS tablet TAKE 1 TABLET BY MOUTH TWICE A DAY 03/10/23   Croitoru, Mihai, MD  furosemide (LASIX) 20 MG tablet As needed patient may take 40 MG Lasix PRN by mouth QD x 4 days as directed per [employer] HF Study. PLEASE DO NOT REMOVE PRESCRIPTION PRN ONLY IF DIRECTED 10/15/22 10/16/23  Croitoru, Mihai, MD  furosemide (LASIX) 40 MG tablet Take 1 tablet (40 mg total) by mouth daily. 10/17/22   Croitoru, Mihai, MD  Multiple Vitamin (MULTIVITAMIN WITH MINERALS) TABS tablet Take 1  tablet by mouth in the morning. Alive    [provider]  potassium chloride (KLOR-CON) 10 MEQ tablet Patient may take additional 20 mEq potassium BY MOUTH DAILY AS DIRECTED for [employer] HF Research Study. PLEASE DO NOT REMOVE PRESCRIPTION PRN ONLY IF DIRECTED BY STUDY TEAM 10/15/22 10/16/23  Croitoru, Rachelle Hora, MD  potassium chloride SA (KLOR-CON M) 20 MEQ tablet Take 1 tablet (20 mEq total) by mouth daily. 10/17/22   Croitoru, Mihai, MD  vitamin A 78295 UNIT capsule Take 10,000 Units by mouth daily.    [provider]  vitamin B-12 (CYANOCOBALAMIN) 1000 MCG tablet Take 1,000 mcg by mouth daily.    [provider]  vitamin E 400 UNIT capsule Take 400 Units by mouth daily.    [provider]  zinc gluconate 50 MG tablet Take 50 mg by mouth daily.    [provider]      Allergies    Patient has no known allergies.    Review of Systems   Review of Systems Negative except as per HPI Physical Exam Updated Vital Signs BP (!) 142/81   Pulse 61   Temp 98.1 F (36.7 C)   Resp 18   Ht 5\' 6"  (1.676 m)   Wt 108.2 kg   LMP 11/05/1995   SpO2 99%   BMI 38.50 kg/m  Physical Exam  Vitals and nursing note reviewed.  Constitutional:      General: She is not in acute distress.    Appearance: She is well-developed. She is not diaphoretic.  HENT:     Head: Normocephalic and atraumatic.  Cardiovascular:     Rate and Rhythm: Normal rate and regular rhythm.     Pulses: Normal pulses.     Heart sounds: Normal heart sounds.  Pulmonary:     Effort: Pulmonary effort is normal.     Breath sounds: Normal breath sounds.  Musculoskeletal:     Right lower leg: Edema present.     Left lower leg: Edema present.     Comments: Trace pitting edema to feet only  Skin:    General: Skin is warm and dry.     Findings: No erythema or rash.  Neurological:     Mental Status: She is alert and oriented to person, place, and time.  Psychiatric:        Behavior:  Behavior normal.     ED Results / Procedures / Treatments   Labs (all labs ordered are listed, but only abnormal results are displayed) Labs Reviewed  CBC WITH DIFFERENTIAL/PLATELET - Abnormal; Notable for the following components:      Result Value   Platelets 132 (*)    All other components within normal limits  BRAIN NATRIURETIC PEPTIDE - Abnormal; Notable for the following components:   B Natriuretic Peptide 159.0 (*)    All other components within normal limits  COMPREHENSIVE METABOLIC PANEL    EKG None  Radiology No results found.  Procedures Procedures    Medications Ordered in ED Medications  furosemide (LASIX) tablet 40 mg (has no administration in time range)    ED Course/ Medical Decision Making/ A&P                                 Medical Decision Making Amount and/or Complexity of Data Reviewed Labs: ordered.  Risk Prescription drug management.   72 year old female presents for Lasix pill as above.  Well-appearing on exam.  Trace pitting edema to feet, lungs clear.  Blood pressure mildly elevated at 142/81, O2 sat 99% on room air.  BNP mildly elevated 159.  CBC and CMP without significant findings.  Patient plans to pick up her prescription in the morning and continue as prescribed.        Final Clinical Impression(s) / ED Diagnoses Final diagnoses:  Swelling    Rx / DC Orders ED Discharge Orders     None         Jeannie Fend, PA-C 06/19/23 0214    Wilkie Aye Mayer Masker, MD 06/19/23 [phone]

## 2023-06-24 ENCOUNTER — Ambulatory Visit: Payer: Medicare Other | Admitting: Family Medicine

## 2023-06-24 NOTE — Progress Notes (Deleted)
   Rubin Payor, PhD, LAT, ATC acting as a scribe for Brittney Graham, MD.  Brittney Walker is a 72 y.o. female who presents to [facility] at [facility] today for 6-wk f/u bilat arm pain. Pt was last seen by Dr. Denyse Amass on 05/06/23 and was advised to give PT a bit more time, completing 6 total visits.   Today, pt reports ***  Dx imaging: 03/25/23 L forearm & c-spine XR   Pertinent review of systems: ***  Relevant historical information: ***   Exam:  LMP 11/05/1995  General: Well Developed, well nourished, and in no acute distress.   MSK: ***    Lab and Radiology Results No results found for this or any previous visit (from the past 72 hour(s)). No results found.     Assessment and Plan: 72 y.o. female with ***   PDMP not reviewed this encounter. No orders of the defined types were placed in this encounter.  No orders of the defined types were placed in this encounter.    Discussed warning signs or symptoms. Please see discharge instructions. Patient expresses understanding.   ***

## 2023-06-27 ENCOUNTER — Other Ambulatory Visit (HOSPITAL_COMMUNITY): Payer: Self-pay

## 2023-06-27 MED ORDER — APIXABAN 5 MG PO TABS: 5.0000 mg | ORAL_TABLET | Freq: Two times a day (BID) | ORAL | 0 refills | Status: DC

## 2023-07-08 ENCOUNTER — Ambulatory Visit: Payer: Medicare Other | Admitting: Family Medicine

## 2023-07-08 NOTE — Progress Notes (Deleted)
   Rubin Payor, PhD, LAT, ATC acting as a scribe for [name], MD.  KATELINA STADTLER is a 72 y.o. female who presents to [facility] at [facility] today for 6-wk f/u bilat arm pain. Pt was last seen by Dr. Denyse Amass on 05/06/23 and was advised to give PT a bit more time, completing 6 total visits.   Today, pt reports ***  Dx imaging: 03/25/23 L forearm & c-spine XR   Pertinent review of systems: ***  Relevant historical information: ***   Exam:  LMP 11/05/1995  General: Well Developed, well nourished, and in no acute distress.   MSK: ***    Lab and Radiology Results No results found for this or any previous visit (from the past 72 hour(s)). No results found.     Assessment and Plan: 72 y.o. female with ***   PDMP not reviewed this encounter. No orders of the defined types were placed in this encounter.  No orders of the defined types were placed in this encounter.    Discussed warning signs or symptoms. Please see discharge instructions. Patient expresses understanding.   ***

## 2023-07-16 ENCOUNTER — Ambulatory Visit (INDEPENDENT_AMBULATORY_CARE_PROVIDER_SITE_OTHER): Payer: Medicare HMO | Admitting: Family Medicine

## 2023-07-16 VITALS — BP 118/82 | HR 55 | Ht 66.0 in | Wt 231.0 lb

## 2023-07-16 DIAGNOSIS — M79602 Pain in left arm: Secondary | ICD-10-CM

## 2023-07-16 DIAGNOSIS — M79601 Pain in right arm: Secondary | ICD-10-CM

## 2023-07-16 NOTE — Patient Instructions (Addendum)
Thank you for coming in today. Recheck as needed.    

## 2023-07-16 NOTE — Progress Notes (Signed)
   Rubin Payor, PhD, LAT, ATC acting as a scribe for [name], MD.  Brittney Walker is a 72 y.o. female who presents to [facility] at [facility] today for 6-wk f/u bilat arm pain. Pt was last seen by Dr. Denyse Amass on 05/06/23 and was advised to give PT a bit more time, completing 6 total visits.   Today, pt reports her arms are feeling pretty good overall. She notes only intermittent pain, esp w/ the changing weather.  She notes the cold air from the air conditioner on her arms tended to make it worse but that has reduced a bit recently and she is feeling better.  Dx imaging: 03/25/23 L forearm & c-spine XR   Pertinent review of systems: No fevers or chills  Relevant historical information: Heart disease   Exam:  BP 118/82   Pulse (!) 55   Ht 5\' 6"  (1.676 m)   Wt 231 lb (104.8 kg)   LMP 11/05/1995   SpO2 95%   BMI 37.28 kg/m  General: Well Developed, well nourished, and in no acute distress.   MSK: Forearms bilaterally normal-appearing nontender normal motion and strength.  Sensation is intact       Assessment and Plan: 72 y.o. female with bilateral forearm pain.  Etiology remains somewhat unclear.  She did have good improvement with physical therapy.  For now watchful waiting is a good plan.  We could reauthorize physical therapy in the future if needed.   PDMP not reviewed this encounter. No orders of the defined types were placed in this encounter.  No orders of the defined types were placed in this encounter.    Discussed warning signs or symptoms. Please see discharge instructions. Patient expresses understanding.   The above documentation has been reviewed and is accurate and complete [name], M.D.

## 2023-08-13 ENCOUNTER — Ambulatory Visit: Payer: Medicare HMO | Admitting: Internal Medicine

## 2023-08-13 ENCOUNTER — Encounter: Payer: Self-pay | Admitting: Internal Medicine

## 2023-08-13 ENCOUNTER — Ambulatory Visit: Payer: Medicare HMO | Admitting: *Deleted

## 2023-08-13 VITALS — BP 122/75 | HR 51 | Temp 98.0°F | Ht 66.5 in | Wt 236.8 lb

## 2023-08-13 VITALS — BP 122/75 | HR 51 | Ht 66.5 in | Wt 236.8 lb

## 2023-08-13 DIAGNOSIS — Z0000 Encounter for general adult medical examination without abnormal findings: Secondary | ICD-10-CM

## 2023-08-13 DIAGNOSIS — E785 Hyperlipidemia, unspecified: Secondary | ICD-10-CM | POA: Diagnosis not present

## 2023-08-13 DIAGNOSIS — Z23 Encounter for immunization: Secondary | ICD-10-CM

## 2023-08-13 MED ORDER — ROSUVASTATIN CALCIUM 20 MG PO TABS: 20.0000 mg | ORAL_TABLET | Freq: Every day | ORAL | 0 refills | Status: DC

## 2023-08-13 NOTE — Assessment & Plan Note (Signed)
Here for CPX -Td 11-2016 - pnm 23: 2016; prevnar 11-2016; PNM 20: 2023 - Had a flu shot -Vaccines I recommend: COVID-vaccine (plans to proceed ),  Shingrix , RSV. -CCS: Had a colonoscopy 10-2014, negative, next 10 years --Female care: per [facility] PAP 12-07-2018. MMG 12/13/21, plans to proceed this year  - Several DEXAs before, last  was  03/06/2015 @  [facility] gynecology.  Standard of care is to repeat  DEXA in 5 to 10 years, rec  to reach out to gynecology.  Continue vitamin D.   -Diet and exercise  -Labs:   See orders - H-POA info provided

## 2023-08-13 NOTE — Progress Notes (Signed)
Subjective:    Patient ID: KESLEE HARRINGTON, female    DOB: 06-Jul-1951, 72 y.o.   MRN: [id number]  DOS:  08/13/2023 Type of visit - description: Here for CPX  In general feeling well. Denies palpitation, difficulty breathing, lower extremity edema. Anticoagulated, denies blood in the stools or in the urine.  Reports side effects from Lipitor, "I feel bizarre, not connected  after I take it".  Could not elaborate more regards side effects.  Review of Systems  Other than above, a 14 point review of systems is negative     Past Medical History:  Diagnosis Date   Atrial fibrillation (HCC) 10/04/2021   Carotid bruit    w/ neg dopplers    CHF (congestive heart failure) (HCC)    History of gastric ulcer 10/04/2021   Hyperthyroidism     s/p RAI 2004   Normocytic anemia    Persistent atrial fibrillation (HCC)    a. Dx 07/2021; b. CHA2DS2VASc = 3-->Eliquis; c. 10/2021 s/p DCCV.   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   Vitamin D deficiency     Past Surgical History:  Procedure Laterality Date   ABDOMINAL HYSTERECTOMY  11/05/1995   TAH-LEIOMYOMATA-- no BSO   BIOPSY  12/25/2021   Procedure: BIOPSY;  Surgeon: Sherrilyn Rist, MD;  Location: [facility];  Service: Gastroenterology;;   BIOPSY THYROID      bx of largest nodule 2005, benign   BREAST SURGERY     x's 2--fibrocystic dz   CARDIOVERSION N/A 10/10/2021   Procedure: CARDIOVERSION;  Surgeon: Thurmon Fair, MD;  Location: [facility];  Service: Cardiovascular;  Laterality: N/A;   CARDIOVERSION N/A 08/23/2022   Procedure: CARDIOVERSION;  Surgeon: Parke Poisson, MD;  Location: [facility] ENDOSCOPY;  Service: Cardiovascular;  Laterality: N/A;   CATARACT EXTRACTION, BILATERAL Bilateral    COLONOSCOPY WITH PROPOFOL N/A 12/25/2021   Procedure: COLONOSCOPY WITH PROPOFOL;  Surgeon: Sherrilyn Rist, MD;  Location: [facility];  Service: Gastroenterology;  Laterality: N/A;   ESOPHAGOGASTRODUODENOSCOPY (EGD)  WITH PROPOFOL N/A 10/12/2021   Procedure: ESOPHAGOGASTRODUODENOSCOPY (EGD) WITH PROPOFOL;  Surgeon: Sherrilyn Rist, MD;  Location: [facility] ENDOSCOPY;  Service: Gastroenterology;  Laterality: N/A;   ESOPHAGOGASTRODUODENOSCOPY (EGD) WITH PROPOFOL N/A 12/25/2021   Procedure: ESOPHAGOGASTRODUODENOSCOPY (EGD) WITH PROPOFOL;  Surgeon: Sherrilyn Rist, MD;  Location: [facility];  Service: Gastroenterology;  Laterality: N/A;   POLYPECTOMY  12/25/2021   Procedure: POLYPECTOMY;  Surgeon: Sherrilyn Rist, MD;  Location: [facility];  Service: Gastroenterology;;   TONSILLECTOMY     Social History   Socioeconomic History   Marital status: Single    Spouse name: Not on file   Number of children: 0   Years of education: Not on file   Highest education level: Not on file  Occupational History   Occupation: retired---collections  Tobacco Use   Smoking status: Never   Smokeless tobacco: Never   Tobacco comments:    Never smoke 08/15/22  Vaping Use   Vaping status: Never Used  Substance and Sexual Activity   Alcohol use: No    Alcohol/week: 0.0 standard drinks of alcohol   Drug use: No   Sexual activity: Never    Comment: [place]  Other Topics Concern   Not on file  Social History Narrative   Lives by herself    Social Determinants of Health   Financial Resource Strain: Medium Risk (08/13/2023)   Overall Financial Resource Strain (CARDIA)    Difficulty of Paying Living  Expenses: Somewhat hard  Food Insecurity: No Food Insecurity (08/13/2023)   Hunger Vital Sign    Worried About Running Out of Food in the Last Year: Never true    Ran Out of Food in the Last Year: Never true  Transportation Needs: No Transportation Needs (08/13/2023)   PRAPARE - Administrator, Civil Service (Medical): No    Lack of Transportation (Non-Medical): No  Physical Activity: Inactive (08/13/2023)   Exercise Vital Sign    Days of Exercise per Week: 0 days    Minutes of Exercise per Session: 0 min   Stress: No Stress Concern Present (08/13/2023)   [employer] of Occupational Health - Occupational Stress Questionnaire    Feeling of Stress : Not at all  Social Connections: Moderately Integrated (08/13/2023)   Social Connection and Isolation Panel [NHANES]    Frequency of Communication with Friends and Family: More than three times a week    Frequency of Social Gatherings with Friends and Family: Once a week    Attends Religious Services: More than 4 times per year    Active Member of [employer] or Organizations: Yes    Attends Engineer, structural: More than 4 times per year    Marital Status: Never married  Intimate Partner Violence: Not At Risk (08/13/2023)   Humiliation, Afraid, Rape, and Kick questionnaire    Fear of Current or Ex-Partner: No    Emotionally Abused: No    Physically Abused: No    Sexually Abused: No     Current Outpatient Medications  Medication Instructions   apixaban (ELIQUIS) 5 mg, Oral, 2 times daily   calcium carbonate (OS-CAL - DOSED IN MG OF ELEMENTAL CALCIUM) 1,250 mg, Oral, Daily with breakfast   cholecalciferol (VITAMIN D) 1,000 Units, Daily   cyanocobalamin (VITAMIN B12) 1,000 mcg, Oral, Daily   furosemide (LASIX) 20 MG tablet As needed patient may take 40 MG Lasix PRN by mouth QD x 4 days as directed per [employer]leviate Research HF Study.<BR>PLEASE DO NOT REMOVE PRESCRIPTION<BR>PRN ONLY IF DIRECTED   furosemide (LASIX) 40 mg, Oral, Daily   Multiple Vitamin (MULTIVITAMIN WITH MINERALS) TABS tablet 1 tablet, Oral, Every morning, Alive   potassium chloride (KLOR-CON) 10 MEQ tablet Patient may take additional 20 mEq potassium BY MOUTH DAILY AS DIRECTED for Alleviate HF Research Study.<BR>PLEASE DO NOT REMOVE PRESCRIPTION<BR>PRN ONLY IF DIRECTED BY STUDY TEAM   potassium chloride SA (KLOR-CON M) 20 MEQ tablet 20 mEq, Oral, Daily   rosuvastatin (CRESTOR) 20 mg, Oral, Daily at bedtime   vitamin A 10,000 Units, Daily   vitamin C 1,000 mg, Oral, Daily    vitamin E 400 Units, Daily   zinc gluconate 50 mg, Oral, Daily       Objective:   Physical Exam BP 122/75   Pulse (!) 51   Temp 98 F (36.7 C) (Oral)   Ht 5' 6.5" (1.689 m)   Wt 236 lb 12 oz (107.4 kg)   LMP 11/05/1995   SpO2 97%   BMI 37.64 kg/m  General: Well developed, NAD, BMI noted Neck: No  thyromegaly  HEENT:  Normocephalic . Face symmetric, atraumatic Lungs:  CTA B Normal respiratory effort, no intercostal retractions, no accessory muscle use. Heart: RRR,  no murmur.  Abdomen:  Not distended, soft, non-tender. No rebound or rigidity.   Lower extremities: no pretibial edema bilaterally  Skin: Exposed areas without rash. Not pale. Not jaundice Neurologic:  alert & oriented X3.  Speech normal, gait appropriate for age and  unassisted Strength symmetric and appropriate for age.  Psych: Cognition and judgment appear intact.  Cooperative with normal attention span and concentration.  Behavior appropriate. No anxious or depressed appearing.     Assessment    ASSESSMENT Gout Thyroid disease,  -RAI 2004 - Bx 2005 neg -[place] 10-2014-- new nodule, Bx unsuccessful 12-2014 Morbid obesity  H/o  VIN I,  2000 Vitamin D deficiency CV: Carotid bruit w/ negative Dopplers A- Fibrillation,Dx 07-2021, cardioverted 10/11/2021, 08/23/2022. CHF Dx 07-2021 Acute anemia due to H. pylori + gastric ulcer 10/2021   PLAN Here for CPX -Td 11-2016 - pnm 23: 2016; prevnar 11-2016; PNM 20: 2023 - Had a flu shot -Vaccines I recommend: COVID-vaccine (plans to proceed ),  Shingrix , RSV. -CCS: Had a colonoscopy 10-2014, negative, next 10 years --Female care: per [facility] PAP 12-07-2018. MMG 12/13/21, plans to proceed this year  - Several DEXAs before, last  was  03/06/2015 @  [facility] gynecology.  Standard of care is to repeat  DEXA in 5 to 10 years, rec  to reach out to gynecology.  Continue vitamin D.   -Diet and exercise  -Labs:   See orders - H-POA info provided  Thyroid disease: Last TSH  WNL, not on any supplements. High cholesterol: Last LDL not at goal, was rec atorvastatin 40 mg but still taking 20 mg.  Reports unusual  s/e, see HPI, will switch to rosuvastatin 20 mg, labs in 6 weeks. Vitamin D deficiency: Last levels pretty good. Atrial fibrillation, CHF: Good med compliance, no symptoms, seems to be regular today.  No change. RTC labs 6 weeks RTC checkup 4 months

## 2023-08-13 NOTE — Patient Instructions (Signed)
[name] , Thank you for taking time to come for your Medicare Wellness Visit. I appreciate your ongoing commitment to your health goals. Please review the following plan we discussed and let me know if I can assist you in the future.   These are the goals we discussed:  Goals   None     This is a list of the screening recommended for you and due dates:  Health Maintenance  Topic Date Due   Zoster (Shingles) Vaccine (1 of 2) Never done   COVID-19 Vaccine (3 - 2023-24 season) 07/06/2023   Mammogram  12/14/2023   Medicare Annual Wellness Visit  08/12/2024   DTaP/Tdap/Td vaccine (3 - Tdap) 12/03/2026   Colon Cancer Screening  12/26/2031   Pneumonia Vaccine  Completed   Flu Shot  Completed   DEXA scan (bone density measurement)  Completed   Hepatitis C Screening  Completed   HPV Vaccine  Aged Out     Next appointment: Follow up in one year for your annual wellness visit.   Preventive Care 56 Years and Older, Female Preventive care refers to lifestyle choices and visits with your health care provider that can promote health and wellness. What does preventive care include? A yearly physical exam. This is also called an annual well check. Dental exams once or twice a year. Routine eye exams. Ask your health care provider how often you should have your eyes checked. Personal lifestyle choices, including: Daily care of your teeth and gums. Regular physical activity. Eating a healthy diet. Avoiding tobacco and drug use. Limiting alcohol use. Practicing safe sex. Taking low-dose aspirin every day. Taking vitamin and mineral supplements as recommended by your health care provider. What happens during an annual well check? The services and screenings done by your health care provider during your annual well check will depend on your age, overall health, lifestyle risk factors, and family history of disease. Counseling  Your health care provider may ask you questions about  your: Alcohol use. Tobacco use. Drug use. Emotional well-being. Home and relationship well-being. Sexual activity. Eating habits. History of falls. Memory and ability to understand (cognition). Work and work Astronomer. Reproductive health. Screening  You may have the following tests or measurements: Height, weight, and BMI. Blood pressure. Lipid and cholesterol levels. These may be checked every 5 years, or more frequently if you are over 73 years old. Skin check. Lung cancer screening. You may have this screening every year starting at age 28 if you have a 30-pack-year history of smoking and currently smoke or have quit within the past 15 years. Fecal occult blood test (FOBT) of the stool. You may have this test every year starting at age 66. Flexible sigmoidoscopy or colonoscopy. You may have a sigmoidoscopy every 5 years or a colonoscopy every 10 years starting at age 45. Hepatitis C blood test. Hepatitis B blood test. Sexually transmitted disease (STD) testing. Diabetes screening. This is done by checking your blood sugar (glucose) after you have not eaten for a while (fasting). You may have this done every 1-3 years. Bone density scan. This is done to screen for osteoporosis. You may have this done starting at age 49. Mammogram. This may be done every 1-2 years. Talk to your health care provider about how often you should have regular mammograms. Talk with your health care provider about your test results, treatment options, and if necessary, the need for more tests. Vaccines  Your health care provider may recommend certain vaccines, such as:  Influenza vaccine. This is recommended every year. Tetanus, diphtheria, and acellular pertussis (Tdap, Td) vaccine. You may need a Td booster every 10 years. Zoster vaccine. You may need this after age 15. Pneumococcal 13-valent conjugate (PCV13) vaccine. One dose is recommended after age 14. Pneumococcal polysaccharide (PPSV23) vaccine.  One dose is recommended after age 27. Talk to your health care provider about which screenings and vaccines you need and how often you need them. This information is not intended to replace advice given to you by your health care provider. Make sure you discuss any questions you have with your health care provider. Document Released: 11/17/2015 Document Revised: 07/10/2016 Document Reviewed: 08/22/2015 Elsevier Interactive Patient Education  2017 [employer].  Fall Prevention in the Home Falls can cause injuries. They can happen to people of all ages. There are many things you can do to make your home safe and to help prevent falls. What can I do on the outside of my home? Regularly fix the edges of walkways and driveways and fix any cracks. Remove anything that might make you trip as you walk through a door, such as a raised step or threshold. Trim any bushes or trees on the path to your home. Use bright outdoor lighting. Clear any walking paths of anything that might make someone trip, such as rocks or tools. Regularly check to see if handrails are loose or broken. Make sure that both sides of any steps have handrails. Any raised decks and porches should have guardrails on the edges. Have any leaves, snow, or ice cleared regularly. Use sand or salt on walking paths during winter. Clean up any spills in your garage right away. This includes oil or grease spills. What can I do in the bathroom? Use night lights. Install grab bars by the toilet and in the tub and shower. Do not use towel bars as grab bars. Use non-skid mats or decals in the tub or shower. If you need to sit down in the shower, use a plastic, non-slip stool. Keep the floor dry. Clean up any water that spills on the floor as soon as it happens. Remove soap buildup in the tub or shower regularly. Attach bath mats securely with double-sided non-slip rug tape. Do not have throw rugs and other things on the floor that can make  you trip. What can I do in the bedroom? Use night lights. Make sure that you have a light by your bed that is easy to reach. Do not use any sheets or blankets that are too big for your bed. They should not hang down onto the floor. Have a firm chair that has side arms. You can use this for support while you get dressed. Do not have throw rugs and other things on the floor that can make you trip. What can I do in the kitchen? Clean up any spills right away. Avoid walking on wet floors. Keep items that you use a lot in easy-to-reach places. If you need to reach something above you, use a strong step stool that has a grab bar. Keep electrical cords out of the way. Do not use floor polish or wax that makes floors slippery. If you must use wax, use non-skid floor wax. Do not have throw rugs and other things on the floor that can make you trip. What can I do with my stairs? Do not leave any items on the stairs. Make sure that there are handrails on both sides of the stairs and use them.  Fix handrails that are broken or loose. Make sure that handrails are as long as the stairways. Check any carpeting to make sure that it is firmly attached to the stairs. Fix any carpet that is loose or worn. Avoid having throw rugs at the top or bottom of the stairs. If you do have throw rugs, attach them to the floor with carpet tape. Make sure that you have a light switch at the top of the stairs and the bottom of the stairs. If you do not have them, ask someone to add them for you. What else can I do to help prevent falls? Wear shoes that: Do not have high heels. Have rubber bottoms. Are comfortable and fit you well. Are closed at the toe. Do not wear sandals. If you use a stepladder: Make sure that it is fully opened. Do not climb a closed stepladder. Make sure that both sides of the stepladder are locked into place. Ask someone to hold it for you, if possible. Clearly mark and make sure that you can  see: Any grab bars or handrails. First and last steps. Where the edge of each step is. Use tools that help you move around (mobility aids) if they are needed. These include: Canes. Walkers. Scooters. Crutches. Turn on the lights when you go into a dark area. Replace any light bulbs as soon as they burn out. Set up your furniture so you have a clear path. Avoid moving your furniture around. If any of your floors are uneven, fix them. If there are any pets around you, be aware of where they are. Review your medicines with your doctor. Some medicines can make you feel dizzy. This can increase your chance of falling. Ask your doctor what other things that you can do to help prevent falls. This information is not intended to replace advice given to you by your health care provider. Make sure you discuss any questions you have with your health care provider. Document Released: 08/17/2009 Document Revised: 03/28/2016 Document Reviewed: 11/25/2014 Elsevier Interactive Patient Education  2017 [employer].

## 2023-08-13 NOTE — Patient Instructions (Addendum)
Stop atorvastatin Take rosuvastatin 20 mg 1 tablet at bedtime. Come back in 6 weeks to recheck your cholesterol to be sure the new medication is working  Vaccines I recommend: Covid booster- new this fall Shingrix (shingles) RSV vaccine  Talk to your gynecologist about repeating a bone density test  Get your mammogram as you are planning.   Check the  blood pressure regularly Blood pressure goal:  between 110/65 and  135/85. If it is consistently higher or lower, let me know    Go to the front desk and schedule the following: Blood work in 6 weeks Office visit with me in 4 months   "Health Care Power of attorney" ,  "Living will" (Advance care planning documents)  If you already have a living will or healthcare power of attorney, is recommended you bring the copy to be scanned in your chart.   The document will be available to all the doctors you see in the system.  Advance care planning is a process that supports adults in  understanding and sharing their preferences regarding future medical care.  The patient's preferences are recorded in documents called Advance Directives and the can be modified at any time while the patient is in full mental capacity.   If you don't have one, please consider create one.      More information at: [email or web address]

## 2023-08-13 NOTE — Assessment & Plan Note (Signed)
Here for CPX Thyroid disease: Last TSH WNL, not on any supplements. High cholesterol: Last LDL not at goal, was rec atorvastatin 40 mg but still taking 20 mg.  Reports unusual  s/e, see HPI, will switch to rosuvastatin 20 mg, labs in 6 weeks. Vitamin D deficiency: Last levels pretty good. Atrial fibrillation, CHF: Good med compliance, no symptoms, seems to be regular today.  No change. RTC labs 6 weeks RTC checkup 4 months

## 2023-08-13 NOTE — Progress Notes (Signed)
Subjective:   Brittney Walker is a 72 y.o. female who presents for Medicare Annual (Subsequent) preventive examination.  Visit Complete: In person  Cardiac Risk Factors include: advanced age (>5men, >53 women);obesity (BMI >30kg/m2);dyslipidemia     Objective:    Today's Vitals   08/13/23 0850  BP: 122/75  Pulse: (!) 51  Weight: 236 lb 12.8 oz (107.4 kg)  Height: 5' 6.5" (1.689 m)   Body mass index is 37.65 kg/m.     08/13/2023    8:59 AM 06/18/2023   10:31 PM 04/23/2023    9:00 AM 08/23/2022    9:09 AM 07/31/2022   10:36 AM 12/25/2021    7:52 AM 10/12/2021    9:28 AM  Advanced Directives  Does Patient Have a Medical Advance Directive? No No No No No No No  Would patient like information on creating a medical advance directive? No - Patient declined No - Patient declined Yes (MAU/Ambulatory/Procedural Areas - Information given)  No - Patient declined No - Patient declined     Current Medications (verified) Outpatient Encounter Medications as of 08/13/2023  Medication Sig   apixaban (ELIQUIS) 5 MG TABS tablet Take 1 tablet (5 mg total) by mouth 2 (two) times daily.   Ascorbic Acid (VITAMIN C) 1000 MG tablet Take 1,000 mg by mouth daily.   atorvastatin (LIPITOR) 20 MG tablet Take 1 tablet (20 mg total) by mouth at bedtime.   calcium carbonate (OS-CAL - DOSED IN MG OF ELEMENTAL CALCIUM) 1250 MG tablet Take 1,250 mg by mouth daily with breakfast.   cholecalciferol (VITAMIN D) 1000 UNITS tablet Take 1,000 Units by mouth daily.   furosemide (LASIX) 20 MG tablet As needed patient may take 40 MG Lasix PRN by mouth QD x 4 days as directed per Alleviate Research HF Study. PLEASE DO NOT REMOVE PRESCRIPTION PRN ONLY IF DIRECTED   furosemide (LASIX) 40 MG tablet Take 1 tablet (40 mg total) by mouth daily.   Multiple Vitamin (MULTIVITAMIN WITH MINERALS) TABS tablet Take 1 tablet by mouth in the morning. Alive   potassium chloride (KLOR-CON) 10 MEQ tablet Patient may take additional 20  mEq potassium BY MOUTH DAILY AS DIRECTED for Alleviate HF Research Study. PLEASE DO NOT REMOVE PRESCRIPTION PRN ONLY IF DIRECTED BY STUDY TEAM   potassium chloride SA (KLOR-CON M) 20 MEQ tablet Take 1 tablet (20 mEq total) by mouth daily.   vitamin A 40981 UNIT capsule Take 10,000 Units by mouth daily.   vitamin B-12 (CYANOCOBALAMIN) 1000 MCG tablet Take 1,000 mcg by mouth daily.   vitamin E 400 UNIT capsule Take 400 Units by mouth daily.   zinc gluconate 50 MG tablet Take 50 mg by mouth daily.   No facility-administered encounter medications on file as of 08/13/2023.    Allergies (verified) Patient has no known allergies.   History: Past Medical History:  Diagnosis Date   Atrial fibrillation (HCC) 10/04/2021   Carotid bruit    w/ neg dopplers    CHF (congestive heart failure) (HCC)    History of gastric ulcer 10/04/2021   Hyperthyroidism     s/p RAI 2004   Normocytic anemia    Persistent atrial fibrillation (HCC)    a. Dx 07/2021; b. CHA2DS2VASc = 3-->Eliquis; c. 10/2021 s/p DCCV.   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   Vitamin D deficiency    Past Surgical History:  Procedure Laterality Date   ABDOMINAL HYSTERECTOMY  11/05/1995   TAH-LEIOMYOMATA-- no BSO   BIOPSY  12/25/2021  Procedure: BIOPSY;  Surgeon: Sherrilyn Rist, MD;  Location: [facility];  Service: Gastroenterology;;   BIOPSY THYROID      bx of largest nodule 2005, benign   BREAST SURGERY     x's 2--fibrocystic dz   CARDIOVERSION N/A 10/10/2021   Procedure: CARDIOVERSION;  Surgeon: Thurmon Fair, MD;  Location: [facility];  Service: Cardiovascular;  Laterality: N/A;   CARDIOVERSION N/A 08/23/2022   Procedure: CARDIOVERSION;  Surgeon: Parke Poisson, MD;  Location: [facility] ENDOSCOPY;  Service: Cardiovascular;  Laterality: N/A;   CATARACT EXTRACTION, BILATERAL Bilateral    COLONOSCOPY WITH PROPOFOL N/A 12/25/2021   Procedure: COLONOSCOPY WITH PROPOFOL;  Surgeon: Sherrilyn Rist, MD;   Location: [facility]COPY;  Service: Gastroenterology;  Laterality: N/A;   ESOPHAGOGASTRODUODENOSCOPY (EGD) WITH PROPOFOL N/A 10/12/2021   Procedure: ESOPHAGOGASTRODUODENOSCOPY (EGD) WITH PROPOFOL;  Surgeon: Sherrilyn Rist, MD;  Location: [facility] ENDOSCOPY;  Service: Gastroenterology;  Laterality: N/A;   ESOPHAGOGASTRODUODENOSCOPY (EGD) WITH PROPOFOL N/A 12/25/2021   Procedure: ESOPHAGOGASTRODUODENOSCOPY (EGD) WITH PROPOFOL;  Surgeon: Sherrilyn Rist, MD;  Location: [facility] ENDOS[facility];  Service: Gastroenterology;  Laterality: N/A;   POLYPECTOMY  12/25/2021   Procedure: POLYPECTOMY;  Surgeon: Sherrilyn Rist, MD;  Location: [facility];  Service: Gastroenterology;;   TONSILLECTOMY     Family History  Problem Relation Age of Onset   Seizures Father        Epilepsy   Hypertension Father    Heart attack Father        ?   Breast cancer Sister 60   Heart disease Mother    Hypertension Mother    Colon cancer Neg Hx    Pancreatic cancer Neg Hx    Esophageal cancer Neg Hx    Prostate cancer Neg Hx    Rectal cancer Neg Hx    Social History   Socioeconomic History   Marital status: Single    Spouse name: Not on file   Number of children: 0   Years of education: Not on file   Highest education level: Not on file  Occupational History   Occupation: retired---collections  Tobacco Use   Smoking status: Never   Smokeless tobacco: Never   Tobacco comments:    Never smoke 08/15/22  Vaping Use   Vaping status: Never Used  Substance and Sexual Activity   Alcohol use: No    Alcohol/week: 0.0 standard drinks of alcohol   Drug use: No   Sexual activity: Never    Comment: [place]  Other Topics Concern   Not on file  Social History Narrative   Lives by herself    Social Determinants of Health   Financial Resource Strain: Medium Risk (08/13/2023)   Overall Financial Resource Strain (CARDIA)    Difficulty of Paying Living Expenses: Somewhat hard  Food Insecurity: No Food Insecurity  (08/13/2023)   Hunger Vital Sign    Worried About Running Out of Food in the Last Year: Never true    Ran Out of Food in the Last Year: Never true  Transportation Needs: No Transportation Needs (08/13/2023)   PRAPARE - Administrator, Civil Service (Medical): No    Lack of Transportation (Non-Medical): No  Physical Activity: Inactive (08/13/2023)   Exercise Vital Sign    Days of Exercise per Week: 0 days    Minutes of Exercise per Session: 0 min  Stress: No Stress Concern Present (08/13/2023)   [employer] of Occupational Health - Occupational Stress Questionnaire    Feeling  of Stress : Not at all  Social Connections: Moderately Integrated (08/13/2023)   Social Connection and Isolation Panel [NHANES]    Frequency of Communication with Friends and Family: More than three times a week    Frequency of Social Gatherings with Friends and Family: Once a week    Attends Religious Services: More than 4 times per year    Active Member of [employer] or Organizations: Yes    Attends Engineer, structural: More than 4 times per year    Marital Status: Never married    Tobacco Counseling Counseling given: Not Answered Tobacco comments: Never smoke 08/15/22   Clinical Intake:  Pre-visit preparation completed: Yes  Pain : No/denies pain  BMI - recorded: 37.65 Nutritional Status: BMI > 30  Obese Nutritional Risks: None Diabetes: No  How often do you need to have someone help you when you read instructions, pamphlets, or other written materials from your doctor or pharmacy?: 1 - Never  Interpreter Needed?: No  Information entered by :: Donne Anon, CMA   Activities of Daily Living    08/13/2023    8:56 AM  In your present state of health, do you have any difficulty performing the following activities:  Hearing? 0  Vision? 0  Difficulty concentrating or making decisions? 0  Walking or climbing stairs? 0  Dressing or bathing? 0  Doing errands, shopping? 0   Preparing Food and eating ? N  Using the Toilet? N  In the past six months, have you accidently leaked urine? N  Do you have problems with loss of bowel control? N  Managing your Medications? N  Managing your Finances? N  Housekeeping or managing your Housekeeping? N    Patient Care Team: Wanda Plump, MD as PCP - General (Internal Medicine) Croitoru, Rachelle Hora, MD as PCP - Cardiology (Cardiology) Mealor, Roberts Gaudy, MD as PCP - Electrophysiology (Cardiology)  Indicate any recent Medical Services you may have received from other than [facility] providers in the past year (date may be approximate).     Assessment:   This is a routine wellness examination for Brittney Walker.  Hearing/Vision screen No results found.   Goals Addressed   None    Depression Screen    08/13/2023    9:03 AM 03/10/2023   11:05 AM 11/22/2022    8:26 AM 07/31/2022    9:15 AM 12/21/2021   10:10 AM 12/09/2017    2:27 PM 12/03/2016    3:50 PM  PHQ 2/9 Scores  PHQ - 2 Score 0 0 0 0 0 0 0    Fall Risk    08/13/2023    8:59 AM 03/10/2023   11:05 AM 11/22/2022    8:26 AM 07/31/2022    9:15 AM 12/21/2021   10:10 AM  Fall Risk   Falls in the past year? 0 0 0 0 0  Number falls in past yr: 0 0 0 0 0  Injury with Fall? 0 0 0 0 0  Risk for fall due to : No Fall Risks      Follow up Falls evaluation completed Falls evaluation completed Falls evaluation completed Falls evaluation completed Falls evaluation completed    MEDICARE RISK AT HOME: Medicare Risk at Home Any stairs in or around the home?: Yes If so, are there any without handrails?: No Home free of loose throw rugs in walkways, pet beds, electrical cords, etc?: Yes Adequate lighting in your home to reduce risk of falls?: Yes Life alert?: No Use of a  cane, walker or w/c?: No Grab bars in the bathroom?: Yes Shower chair or bench in shower?: No Elevated toilet seat or a handicapped toilet?: Yes  TIMED UP AND GO:  Was the test performed?  Yes  Length of time to  ambulate 10 feet: 7 sec Gait slow and steady without use of assistive device    Cognitive Function:        08/13/2023    9:08 AM 07/31/2022   10:44 AM  6CIT Screen  What Year? 0 points 0 points  What month? 0 points 0 points  What time? 0 points 0 points  Count back from 20 0 points 0 points  Months in reverse 0 points 0 points  Repeat phrase 0 points 0 points  Total Score 0 points 0 points    Immunizations Immunization History  Administered Date(s) Administered   Fluad Quad(high Dose 65+) 10/11/2021, 07/31/2022   Fluad Trivalent(High Dose 65+) 08/13/2023   Influenza, High Dose Seasonal PF 12/09/2017   Influenza,inj,Quad PF,6+ Mos 10/14/2014, 10/12/2015, 12/03/2016   Janssen (J&J) SARS-COV-2 Vaccination 01/16/2020   PFIZER(Purple Top)SARS-COV-2 Vaccination 09/30/2020   PNEUMOCOCCAL CONJUGATE-20 12/21/2021   Pneumococcal Conjugate-13 12/03/2016   Pneumococcal Polysaccharide-23 10/12/2015   Td 06/27/2005, 12/03/2016    TDAP status: Up to date  Flu Vaccine status: Completed at today's visit  Pneumococcal vaccine status: Up to date  Covid-19 vaccine status: Information provided on how to obtain vaccines.   Qualifies for Shingles Vaccine? Yes   Zostavax completed No   Shingrix Completed?: No.    Education has been provided regarding the importance of this vaccine. Patient has been advised to call insurance company to determine out of pocket expense if they have not yet received this vaccine. Advised may also receive vaccine at local pharmacy or Health Dept. Verbalized acceptance and understanding.  Screening Tests Health Maintenance  Topic Date Due   Zoster Vaccines- Shingrix (1 of 2) Never done   COVID-19 Vaccine (3 - 2023-24 season) 07/06/2023   Medicare Annual Wellness (AWV)  08/01/2023   MAMMOGRAM  12/14/2023   DTaP/Tdap/Td (3 - Tdap) 12/03/2026   Colonoscopy  12/26/2031   Pneumonia Vaccine 28+ Years old  Completed   INFLUENZA VACCINE  Completed   DEXA SCAN   Completed   Hepatitis C Screening  Completed   HPV VACCINES  Aged Out    Health Maintenance  Health Maintenance Due  Topic Date Due   Zoster Vaccines- Shingrix (1 of 2) Never done   COVID-19 Vaccine (3 - 2023-24 season) 07/06/2023   Medicare Annual Wellness (AWV)  08/01/2023    Colorectal cancer screening: Type of screening: Colonoscopy. Completed 12/25/21. Repeat every 10 years  Mammogram status: Completed 12/13/21. Repeat every year  Bone Density status: will wait to see what facility is in network and call us to place order  Lung Cancer Screening: (Low Dose CT Chest recommended if Age 5-80 years, 20 pack-year currently smoking OR have quit w/in 15years.) does not qualify.   Additional Screening:  Hepatitis C Screening: does qualify; Completed 06/24/17  Vision Screening: Recommended annual ophthalmology exams for early detection of glaucoma and other disorders of the eye. Is the patient up to date with their annual eye exam?  No  Who is the provider or what is the name of the office in which the patient attends annual eye exams? [facility] If pt is not established with a provider, would they like to be referred to a provider to establish care? No .   Dental  Screening: Recommended annual dental exams for proper oral hygiene  Diabetic Foot Exam: N/a  Community Resource Referral / Chronic Care Management: CRR required this visit?  No   CCM required this visit?  No     Plan:     I have personally reviewed and noted the following in the patient's chart:   Medical and social history Use of alcohol, tobacco or illicit drugs  Current medications and supplements including opioid prescriptions. Patient is not currently taking opioid prescriptions. Functional ability and status Nutritional status Physical activity Advanced directives List of other physicians Hospitalizations, surgeries, and ER visits in previous 12 months Vitals Screenings to include cognitive,  depression, and falls Referrals and appointments  In addition, I have reviewed and discussed with patient certain preventive protocols, quality metrics, and best practice recommendations. A written personalized care plan for preventive services as well as general preventive health recommendations were provided to patient.     Donne Anon, CMA   08/13/2023   After Visit Summary: pt declined  Nurse Notes: None

## 2023-09-12 ENCOUNTER — Other Ambulatory Visit (HOSPITAL_COMMUNITY): Payer: Self-pay

## 2023-09-12 MED ORDER — APIXABAN 5 MG PO TABS: 5.0000 mg | ORAL_TABLET | Freq: Two times a day (BID) | ORAL | Status: DC

## 2023-09-15 DIAGNOSIS — H2513 Age-related nuclear cataract, bilateral: Secondary | ICD-10-CM | POA: Diagnosis not present

## 2023-09-15 DIAGNOSIS — H40023 Open angle with borderline findings, high risk, bilateral: Secondary | ICD-10-CM | POA: Diagnosis not present

## 2023-09-15 DIAGNOSIS — H524 Presbyopia: Secondary | ICD-10-CM | POA: Diagnosis not present

## 2023-09-23 ENCOUNTER — Telehealth (HOSPITAL_COMMUNITY): Payer: Self-pay | Admitting: *Deleted

## 2023-09-23 NOTE — Telephone Encounter (Signed)
Pt unable to get Eliquis refilled due to cost as she is in the donut hole and request samples. Samples left a front desk for her to p/u  Medication Samples have been provided to the patient.  Drug name: Eliquis       Strength: 5 mg        Qty: 4  LOT: [id number]  Exp.Date: 08/2024  Dosing instructions: Take 1 tab Twice daily   The patient has been instructed regarding the correct time, dose, and frequency of taking this medication, including desired effects and most common side effects.   Brittney Walker 2:55 PM 09/23/2023

## 2023-09-24 ENCOUNTER — Other Ambulatory Visit: Payer: Medicare HMO

## 2023-09-26 ENCOUNTER — Ambulatory Visit: Payer: Medicare HMO | Admitting: Internal Medicine

## 2023-09-26 IMAGING — CR DG CHEST 2V
2 series · 2 of 2 positions shown · non-contrast
Comparison: Previous studies including the examination of
09/03/2021

CLINICAL DATA: Difficulty breathing

EXAM:
CHEST - 2 VIEW

[chest lat]
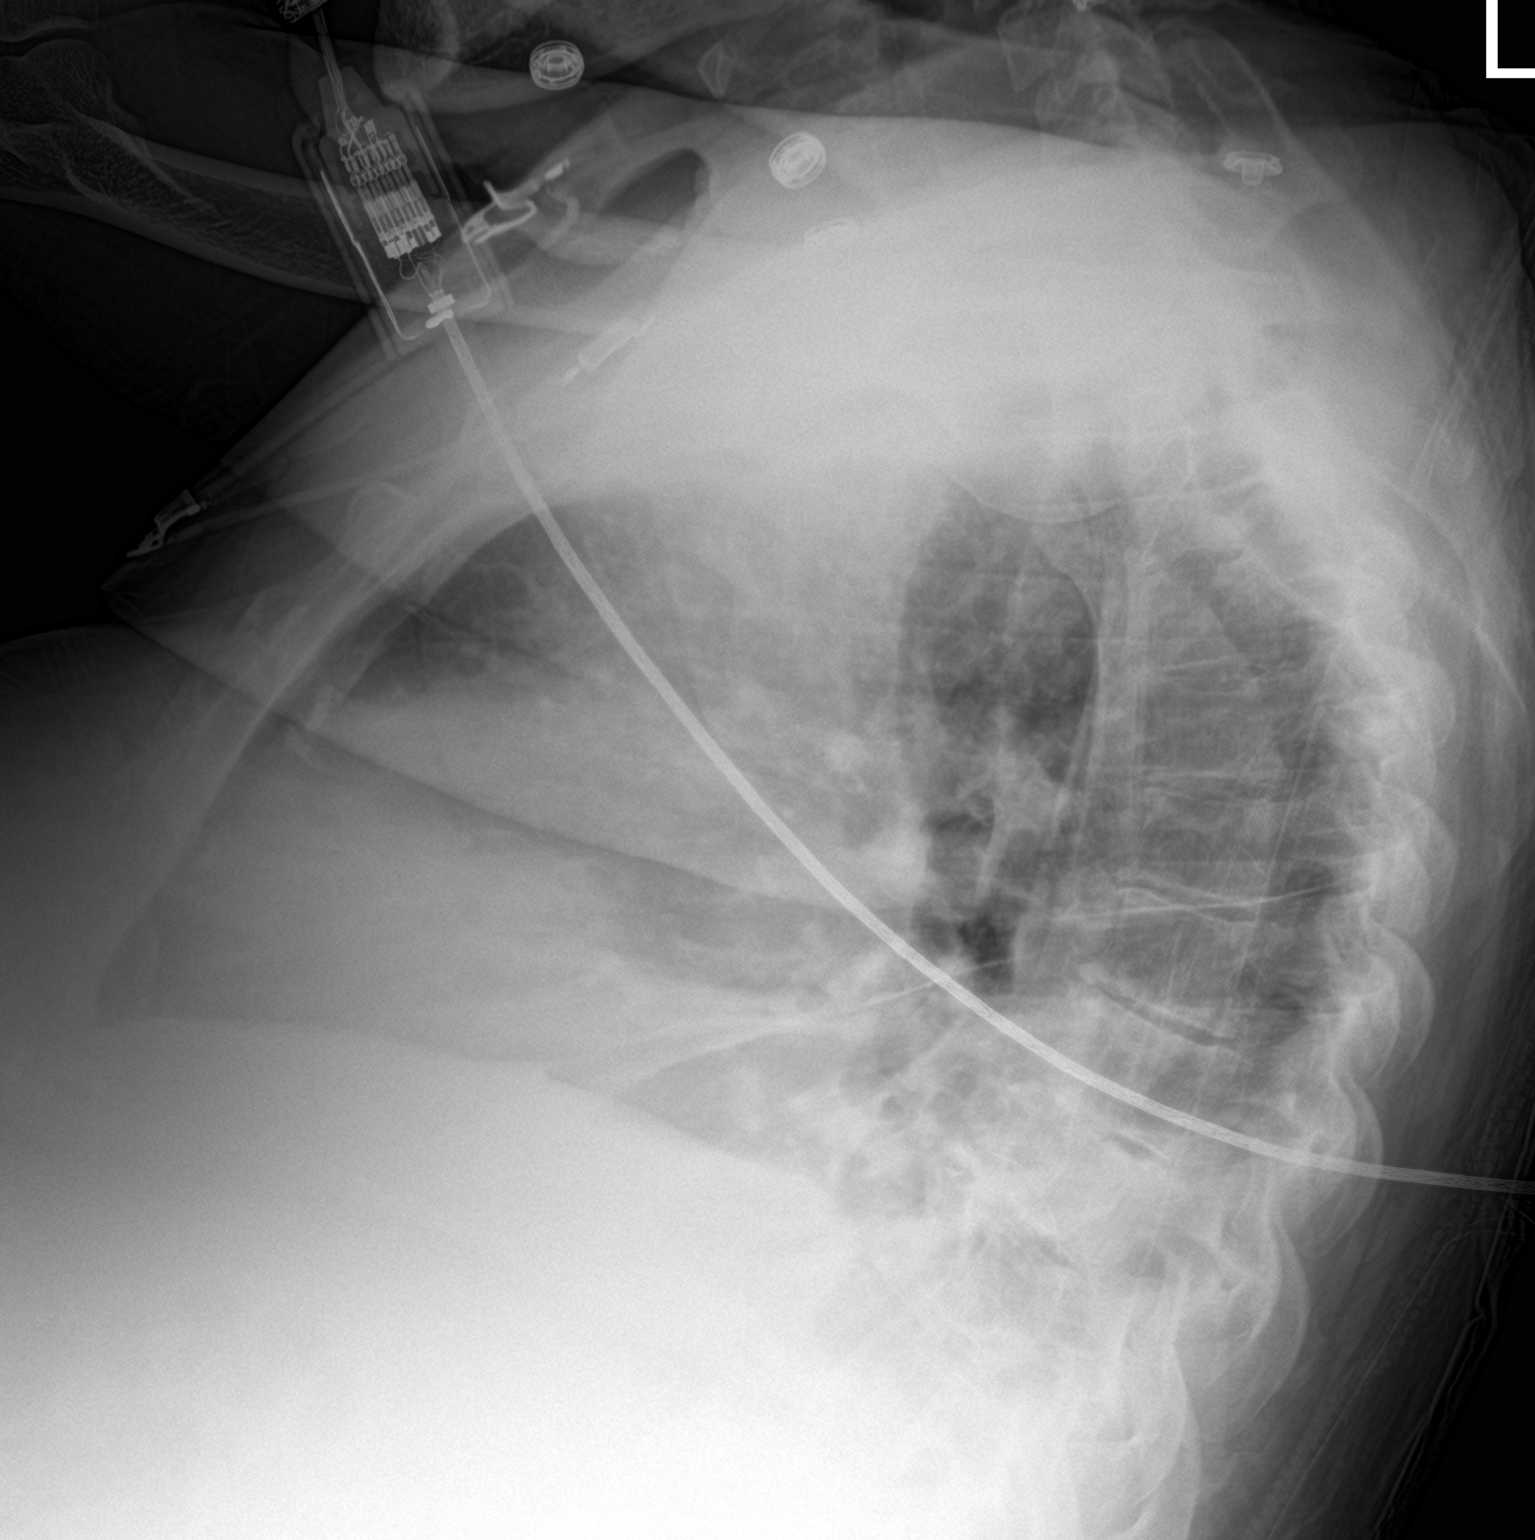

[chest ap]
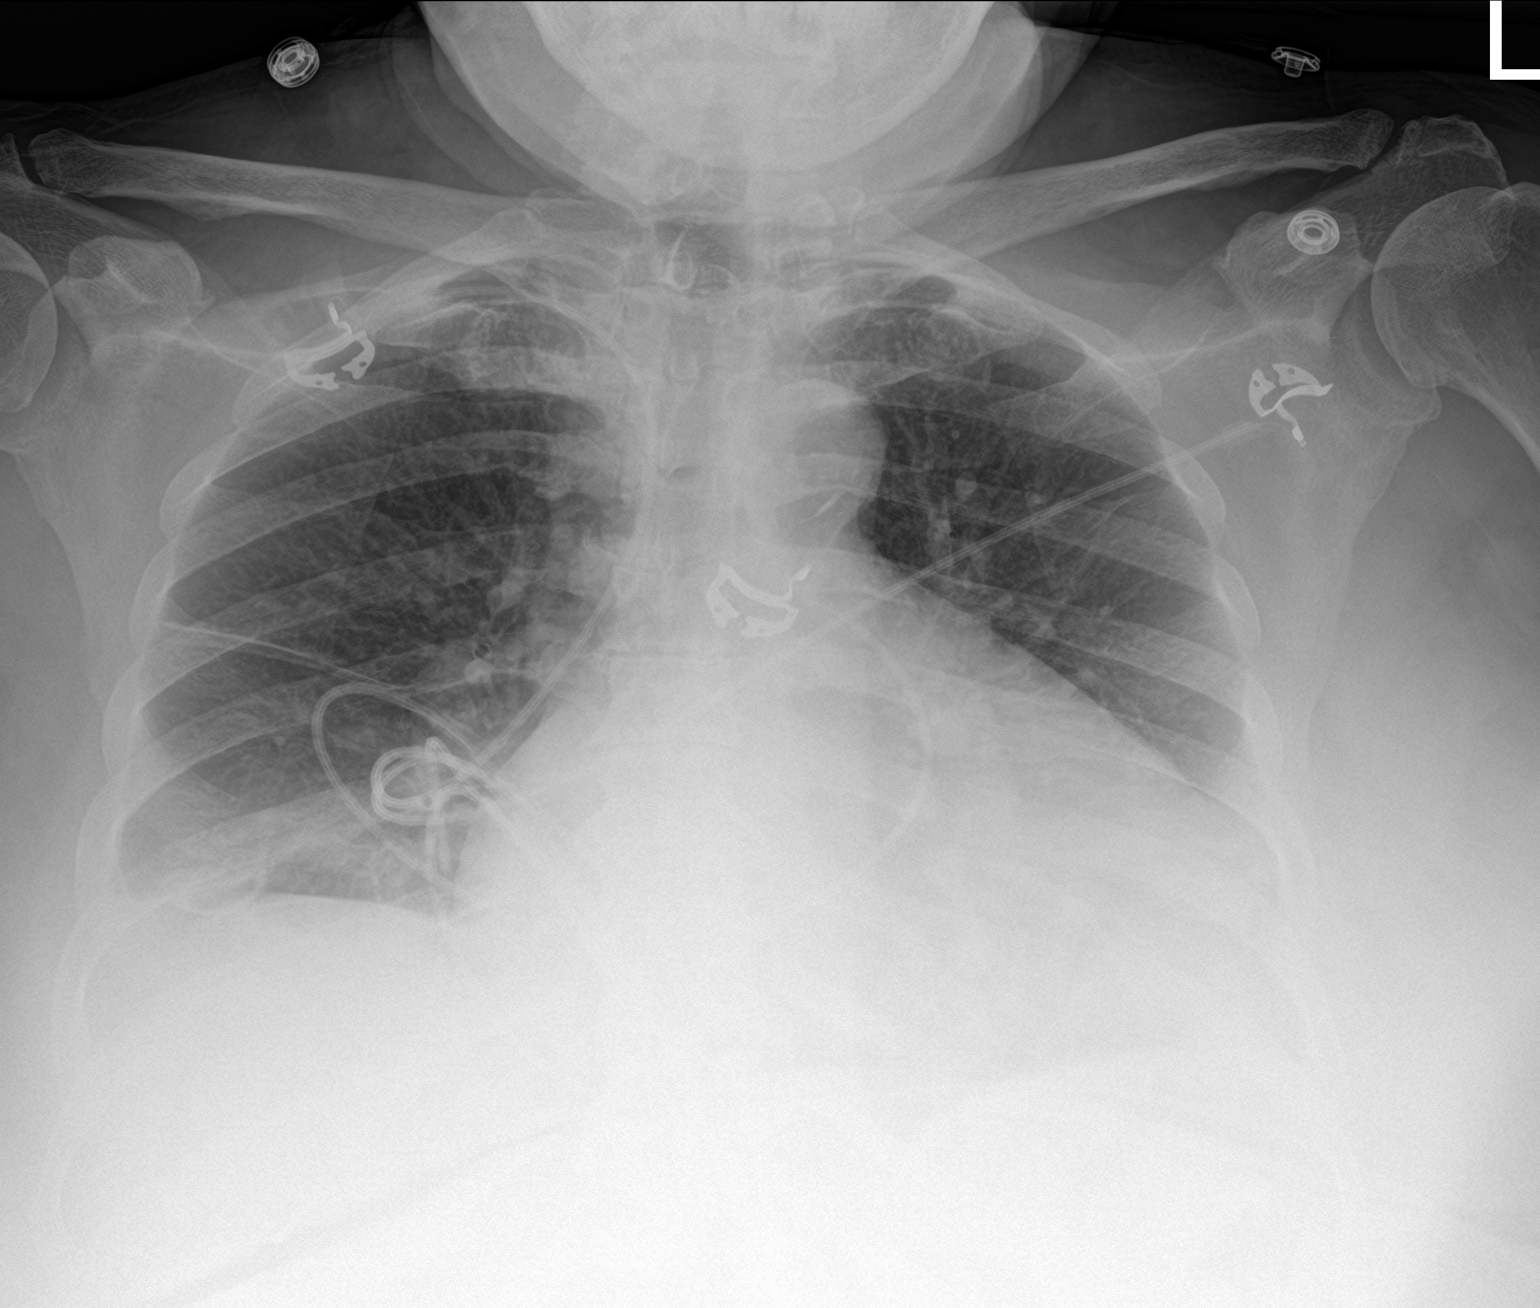

[2 of 2 positions shown; findings below may reference images not displayed]

FINDINGS: Transverse diameter of heart is increased. There are no signs of
alveolar pulmonary edema. There is poor inspiration. There is
blunting of lateral CP angles. There is no pneumothorax. Patchy
infiltrates are seen in the right lower lung fields. There is
interval improvement in aeration in the right parahilar region.
IMPRESSION: Cardiomegaly. There are no signs of pulmonary edema. Increased
density in the right lower lung fields may suggest pleural effusion
and possibly underlying atelectasis.

## 2023-09-30 ENCOUNTER — Ambulatory Visit: Payer: Medicare HMO | Admitting: Internal Medicine

## 2023-10-01 ENCOUNTER — Encounter: Payer: Self-pay | Admitting: Internal Medicine

## 2023-10-01 ENCOUNTER — Ambulatory Visit: Payer: Medicare HMO | Admitting: Internal Medicine

## 2023-10-01 VITALS — BP 136/68 | HR 53 | Temp 98.1°F | Resp 18 | Ht 66.5 in | Wt 234.1 lb

## 2023-10-01 DIAGNOSIS — E785 Hyperlipidemia, unspecified: Secondary | ICD-10-CM | POA: Diagnosis not present

## 2023-10-01 DIAGNOSIS — J069 Acute upper respiratory infection, unspecified: Secondary | ICD-10-CM

## 2023-10-01 NOTE — Progress Notes (Unsigned)
Subjective:    Patient ID: Brittney Walker, female    DOB: [birth year], 72 y.o.   MRN: [id number]  DOS:  10/01/2023 Type of visit - description: f/u  Here to get her cholesterol blood work.  Also developed a cold 2 days ago.  Denies fever or chills, mild cough with yellowish sputum production. Mild sore throat as well. No other symptoms.  Check factor V?  Review of Systems See above   Past Medical History:  Diagnosis Date   Atrial fibrillation (HCC) 10/04/2021   Carotid bruit    w/ neg dopplers    CHF (congestive heart failure) (HCC)    History of gastric ulcer 10/04/2021   Hyperthyroidism     s/p RAI 2004   Normocytic anemia    Persistent atrial fibrillation (HCC)    a. Dx 07/2021; b. CHA2DS2VASc = 3-->Eliquis; c. 10/2021 s/p DCCV.   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   Vitamin D deficiency     Past Surgical History:  Procedure Laterality Date   ABDOMINAL HYSTERECTOMY  11/05/1995   TAH-LEIOMYOMATA-- no BSO   BIOPSY  12/25/2021   Procedure: BIOPSY;  Surgeon: Sherrilyn Rist, MD;  Location: [facility] ENDOSCOPY;  Service: Gastroenterology;;   BIOPSY THYROID      bx of largest nodule 2005, benign   BREAST SURGERY     x's 2--fibrocystic dz   CARDIOVERSION N/A 10/10/2021   Procedure: CARDIOVERSION;  Surgeon: Thurmon Fair, MD;  Location: [facility] ENDOSCOPY;  Service: Cardiovascular;  Laterality: N/A;   CARDIOVERSION N/A 08/23/2022   Procedure: CARDIOVERSION;  Surgeon: Parke Poisson, MD;  Location: [facility] ENDOSCOPY;  Service: Cardiovascular;  Laterality: N/A;   CATARACT EXTRACTION, BILATERAL Bilateral    COLONOSCOPY WITH PROPOFOL N/A 12/25/2021   Procedure: COLONOSCOPY WITH PROPOFOL;  Surgeon: Sherrilyn Rist, MD;  Location: [facility] ENDOSCOPY;  Service: Gastroenterology;  Laterality: N/A;   ESOPHAGOGASTRODUODENOSCOPY (EGD) WITH PROPOFOL N/A 10/12/2021   Procedure: ESOPHAGOGASTRODUODENOSCOPY (EGD) WITH PROPOFOL;  Surgeon: Sherrilyn Rist, MD;  Location: [facility]  ENDOSCOPY;  Service: Gastroenterology;  Laterality: N/A;   ESOPHAGOGASTRODUODENOSCOPY (EGD) WITH PROPOFOL N/A 12/25/2021   Procedure: ESOPHAGOGASTRODUODENOSCOPY (EGD) WITH PROPOFOL;  Surgeon: Sherrilyn Rist, MD;  Location: [facility] ENDOSCOPY;  Service: Gastroenterology;  Laterality: N/A;   POLYPECTOMY  12/25/2021   Procedure: POLYPECTOMY;  Surgeon: Sherrilyn Rist, MD;  Location: [facility] ENDOSCOPY;  Service: Gastroenterology;;   TONSILLECTOMY      Current Outpatient Medications  Medication Instructions   apixaban (ELIQUIS) 5 mg, Oral, 2 times daily   calcium carbonate (OS-CAL - DOSED IN MG OF ELEMENTAL CALCIUM) 1,250 mg, Oral, Daily with breakfast   cholecalciferol (VITAMIN D) 1,000 Units, Daily   cyanocobalamin (VITAMIN B12) 1,000 mcg, Oral, Daily   furosemide (LASIX) 20 MG tablet As needed patient may take 40 MG Lasix PRN by mouth QD x 4 days as directed per Alleviate Research HF Study.<BR>PLEASE DO NOT REMOVE PRESCRIPTION<BR>PRN ONLY IF DIRECTED   furosemide (LASIX) 40 mg, Oral, Daily   Multiple Vitamin (MULTIVITAMIN WITH MINERALS) TABS tablet 1 tablet, Oral, Every morning, Alive   potassium chloride (KLOR-CON) 10 MEQ tablet Patient may take additional 20 mEq potassium BY MOUTH DAILY AS DIRECTED for Alleviate HF Research Study.<BR>PLEASE DO NOT REMOVE PRESCRIPTION<BR>PRN ONLY IF DIRECTED BY STUDY TEAM   potassium chloride SA (KLOR-CON M) 20 MEQ tablet 20 mEq, Oral, Daily   rosuvastatin (CRESTOR) 20 mg, Oral, Daily at bedtime   vitamin A 10,000 Units, Daily   vitamin C 1,000  mg, Oral, Daily   vitamin E 400 Units, Daily   zinc gluconate 50 mg, Oral, Daily       Objective:   Physical Exam BP 136/68   Pulse (!) 53   Temp 98.1 F (36.7 C) (Oral)   Resp 18   Ht 5' 6.5" (1.689 m)   Wt 234 lb 2 oz (106.2 kg)   LMP 11/05/1995   SpO2 96%   BMI 37.22 kg/m  General:   Well developed, NAD, BMI noted. HEENT:  Normocephalic . Face symmetric, atraumatic. Nose slightly congested. Throat:  Symmetric, no red. TMs: Normal Lungs:  CTA B Normal respiratory effort, no intercostal retractions, no accessory muscle use. Heart: RRR,  no murmur.  Lower extremities: no pretibial edema bilaterally  Skin: Not pale. Not jaundice Neurologic:  alert & oriented X3.  Speech normal, gait appropriate for age and unassisted Psych--  Cognition and judgment appear intact.  Cooperative with normal attention span and concentration.  Behavior appropriate. No anxious or depressed appearing.      Assessment     ASSESSMENT Gout Thyroid disease,  -RAI 2004 - Bx 2005 neg -[place] 10-2014-- new nodule, Bx unsuccessful 12-2014 Morbid obesity  H/o  VIN I,  2000 Vitamin D deficiency CV: Carotid bruit w/ negative Dopplers A- Fibrillation,Dx 07-2021, cardioverted 10/11/2021, 08/23/2022. CHF Dx 07-2021 Acute anemia due to H. pylori + gastric ulcer 10/2021   PLAN High cholesterol: On rosuvastatin, reports no side effects, check FLP AST ALT URI: Symptoms started 2 days ago, afebrile, exam benign.  Recommend rest, fluids, Tylenol, Robitussin, Astepro, see AVS. Factor V check?  Her brother has atrial fibrillation and his brother's doctor said to check for factor V.  Unclear reason to me, recommend to discuss with cardiology and see if that is necessary. RTC 4 months     Here for CPX -Td 11-2016 - pnm 23: 2016; prevnar 11-2016; PNM 20: 2023 - Had a flu shot -Vaccines I recommend: COVID-vaccine (plans to proceed ),  Shingrix , RSV. -CCS: Had a colonoscopy 10-2014, negative, next 10 years --Female care: per [facility] PAP 12-07-2018. MMG 12/13/21, plans to proceed this year  - Several DEXAs before, last  was  03/06/2015 @  [facility] gynecology.  Standard of care is to repeat  DEXA in 5 to 10 years, rec  to reach out to gynecology.  Continue vitamin D.   -Diet and exercise  -Labs:   See orders - H-POA info provided  Thyroid disease: Last TSH WNL, not on any supplements. High cholesterol: Last LDL not at goal,  was rec atorvastatin 40 mg but still taking 20 mg.  Reports unusual  s/e, see HPI, will switch to rosuvastatin 20 mg, labs in 6 weeks. Vitamin D deficiency: Last levels pretty good. Atrial fibrillation, CHF: Good med compliance, no symptoms, seems to be regular today.  No change. RTC labs 6 weeks RTC checkup 4 months

## 2023-10-01 NOTE — Patient Instructions (Addendum)
Vaccines I recommend: Shingrix (shingles)   Rest, fluids , tylenol  For cough:  Take Mucinex DM or Robitussin-DM OTC.  Follow the instructions in the box.   For nasal congestion: -Use OTC Astepro 2 nasal sprays on each side of the nose twice daily until better  Avoid decongestants such as  Pseudoephedrine or phenylephrine   Call if not gradually better over the next  10 days   Check the  blood pressure regularly Blood pressure goal:  between 110/65 and  135/85. If it is consistently higher or lower, let me know     GO TO THE LAB : Get the blood work     Next visit with me in 4 months Please schedule it at the front desk

## 2023-10-02 LAB — LIPID PANEL
Cholesterol: 222 mg/dL — ABNORMAL HIGH (ref <200)
HDL: 69 mg/dL (ref >=50)
LDL Cholesterol (Calc): 138 mg/dL — ABNORMAL HIGH
Non-HDL Cholesterol (Calc): 153 mg/dL — ABNORMAL HIGH (ref <130)
Total CHOL/HDL Ratio: 3.2 (calc) (ref <5.0)
Triglycerides: 62 mg/dL (ref <150)

## 2023-10-02 LAB — ALT: ALT: 11 U/L (ref 6–29)

## 2023-10-02 LAB — AST: AST: 14 U/L (ref 10–35)

## 2023-10-02 NOTE — Assessment & Plan Note (Signed)
High cholesterol: On rosuvastatin, reports no side effects, check FLP AST ALT URI: Symptoms started 2 days ago, afebrile, exam benign.  Recommend rest, fluids, Tylenol, Robitussin, Astepro, see AVS. Factor V check?  Her brother has atrial fibrillation (no CAD-stroke) and his brother's doctor said to check for factor V, recommend to discuss with cardiology and see if that is necessary. RTC 4 months

## 2023-10-06 MED ORDER — BENZONATATE 200 MG PO CAPS: 200.0000 mg | ORAL_CAPSULE | Freq: Three times a day (TID) | ORAL | 0 refills | Status: DC | PRN

## 2023-10-06 MED ORDER — ROSUVASTATIN CALCIUM 40 MG PO TABS: 40.0000 mg | ORAL_TABLET | Freq: Every day | ORAL | 1 refills | Status: DC

## 2023-10-06 NOTE — Addendum Note (Signed)
Addended byConrad [place] D on: 10/06/2023 04:13 PM   Modules accepted: Orders

## 2023-10-06 NOTE — Addendum Note (Signed)
Addended byConrad [place] D on: 10/06/2023 08:52 AM   Modules accepted: Orders

## 2023-10-07 DIAGNOSIS — Z006 Encounter for examination for normal comparison and control in clinical research program: Secondary | ICD-10-CM

## 2023-10-07 MED ORDER — FUROSEMIDE 20 MG PO TABS
ORAL_TABLET | ORAL | 3 refills | Status: DC
Start: 2023-10-07 — End: 2024-09-27

## 2023-10-07 MED ORDER — POTASSIUM CHLORIDE ER 10 MEQ PO TBCR
EXTENDED_RELEASE_TABLET | ORAL | 3 refills | Status: DC
Start: 2023-10-07 — End: 2024-08-17

## 2023-10-07 NOTE — Research (Signed)
ALLEVIATE 24 Month F/U  No adverse events or cardiovascular changes to report at this time.  NYHA l  Current Outpatient Medications:    apixaban (ELIQUIS) 5 MG TABS tablet, Take 1 tablet (5 mg total) by mouth 2 (two) times daily., Disp: 56 tablet, Rfl:    Ascorbic Acid (VITAMIN C) 1000 MG tablet, Take 1,000 mg by mouth daily., Disp: , Rfl:    benzonatate (TESSALON) 200 MG capsule, Take 1 capsule (200 mg total) by mouth 3 (three) times daily as needed for cough., Disp: 21 capsule, Rfl: 0   calcium carbonate (OS-CAL - DOSED IN MG OF ELEMENTAL CALCIUM) 1250 MG tablet, Take 1,250 mg by mouth daily with breakfast., Disp: , Rfl:    cholecalciferol (VITAMIN D) 1000 UNITS tablet, Take 1,000 Units by mouth daily., Disp: , Rfl:    furosemide (LASIX) 20 MG tablet, As needed patient may take 40 MG Lasix PRN by mouth QD x 4 days as directed per [facility] HF Study. PLEASE DO NOT REMOVE PRESCRIPTION PRN ONLY IF DIRECTED (Patient not taking: Reported on 08/13/2023), Disp: 90 tablet, Rfl: 3   furosemide (LASIX) 40 MG tablet, Take 1 tablet (40 mg total) by mouth daily., Disp: 90 tablet, Rfl: 3   Multiple Vitamin (MULTIVITAMIN WITH MINERALS) TABS tablet, Take 1 tablet by mouth in the morning. Alive, Disp: , Rfl:    potassium chloride (KLOR-CON) 10 MEQ tablet, Patient may take additional 20 mEq potassium BY MOUTH DAILY AS DIRECTED for [facility] HF Research Study. PLEASE DO NOT REMOVE PRESCRIPTION PRN ONLY IF DIRECTED BY STUDY TEAM (Patient not taking: Reported on 08/13/2023), Disp: 90 tablet, Rfl: 3   potassium chloride SA (KLOR-CON M) 20 MEQ tablet, Take 1 tablet (20 mEq total) by mouth daily., Disp: 90 tablet, Rfl: 3   rosuvastatin (CRESTOR) 40 MG tablet, Take 1 tablet (40 mg total) by mouth at bedtime., Disp: 90 tablet, Rfl: 1   vitamin A 16109 UNIT capsule, Take 10,000 Units by mouth daily., Disp: , Rfl:    vitamin B-12 (CYANOCOBALAMIN) 1000 MCG tablet, Take 1,000 mcg by mouth daily., Disp: , Rfl:     vitamin E 400 UNIT capsule, Take 400 Units by mouth daily., Disp: , Rfl:    zinc gluconate 50 MG tablet, Take 50 mg by mouth daily., Disp: , Rfl:

## 2023-10-10 ENCOUNTER — Telehealth: Payer: Self-pay | Admitting: Cardiovascular Disease

## 2023-10-10 DIAGNOSIS — I999 Unspecified disorder of circulatory system: Secondary | ICD-10-CM

## 2023-10-10 DIAGNOSIS — D6869 Other thrombophilia: Secondary | ICD-10-CM

## 2023-10-10 NOTE — Telephone Encounter (Signed)
Factor V Leiden is a test we do to see if patients have a hypercoagulable state, which makes them prone to venous clots and pulmonary embolism. I have no problem with [place] ordering the test for her, but it probably will not make a big difference in how we manage her.  The treatment for patients with factor V Leiden who have had problems with clots is anticoagulation with a medication such as Eliquis.  She is already taking Eliquis for atrial fibrillation.  In other words, knowing whether or not she has factor V Leiden will not make a big difference in our treatment plan.

## 2023-10-10 NOTE — Telephone Encounter (Signed)
  Patient is calling requesting to have a Factor V blood test done. Please advise.

## 2023-10-10 NOTE — Telephone Encounter (Signed)
Called patient with no answer. Left vm to return call. Patient called in requesting a Factor V blood test.

## 2023-10-10 NOTE — Telephone Encounter (Signed)
Called and spoke to patient. Below message relayed per Dr Royann Shivers. Patient verbalized understanding. Lab order placed and she will come by at her convenience to have lab drawn.  Thurmon Fair, MD  Physician Cardiology   Creation Time: 10/10/2023  2:06 PM  Factor V Leiden is a test we do to see if patients have a hypercoagulable state, which makes them prone to venous clots and pulmonary embolism. I have no problem with [place] ordering the test for her, but it probably will not make a big difference in how we manage her.  The treatment for patients with factor V Leiden who have had problems with clots is anticoagulation with a medication such as Eliquis.  She is already taking Eliquis for atrial fibrillation.  In other words, knowing whether or not she has factor V Leiden will not make a big difference in our treatment plan

## 2023-11-02 ENCOUNTER — Other Ambulatory Visit: Payer: Self-pay | Admitting: Cardiovascular Disease

## 2023-12-11 ENCOUNTER — Telehealth (HOSPITAL_COMMUNITY): Payer: Self-pay

## 2023-12-11 NOTE — Telephone Encounter (Signed)
 I have tried to call patient back. Her phone goes straight to voicemail. Her voicemail box is not set up.

## 2023-12-16 ENCOUNTER — Encounter: Payer: Self-pay | Admitting: *Deleted

## 2023-12-16 ENCOUNTER — Encounter: Payer: Self-pay | Admitting: Internal Medicine

## 2023-12-16 ENCOUNTER — Ambulatory Visit: Payer: Medicare HMO | Admitting: Internal Medicine

## 2023-12-16 ENCOUNTER — Telehealth: Payer: Self-pay | Admitting: Cardiovascular Disease

## 2023-12-16 NOTE — Telephone Encounter (Signed)
Patient would like assistance with [employer] application renewal.

## 2023-12-23 ENCOUNTER — Other Ambulatory Visit (HOSPITAL_COMMUNITY): Payer: Self-pay

## 2023-12-23 MED ORDER — APIXABAN 5 MG PO TABS: 5.0000 mg | ORAL_TABLET | Freq: Two times a day (BID) | ORAL | Status: DC

## 2023-12-24 ENCOUNTER — Other Ambulatory Visit (HOSPITAL_COMMUNITY): Payer: Self-pay

## 2023-12-24 MED ORDER — APIXABAN 5 MG PO TABS: 5.0000 mg | ORAL_TABLET | Freq: Two times a day (BID) | ORAL | Status: DC

## 2024-01-02 ENCOUNTER — Telehealth: Payer: Self-pay | Admitting: Cardiovascular Disease

## 2024-01-02 ENCOUNTER — Encounter: Payer: Self-pay | Admitting: Cardiovascular Disease

## 2024-01-02 ENCOUNTER — Other Ambulatory Visit (HOSPITAL_COMMUNITY): Payer: Self-pay

## 2024-01-02 DIAGNOSIS — I480 Paroxysmal atrial fibrillation: Secondary | ICD-10-CM

## 2024-01-02 MED ORDER — FUROSEMIDE 40 MG PO TABS: 40.0000 mg | ORAL_TABLET | Freq: Every day | ORAL | 0 refills | Status: DC

## 2024-01-02 MED ORDER — APIXABAN 5 MG PO TABS
5.0000 mg | ORAL_TABLET | Freq: Two times a day (BID) | ORAL | 5 refills | Status: DC
  Filled 2024-01-02: qty 60, 30d supply, fill #0

## 2024-01-02 MED ORDER — APIXABAN 5 MG PO TABS: 5.0000 mg | ORAL_TABLET | Freq: Two times a day (BID) | ORAL | 5 refills | Status: DC

## 2024-01-02 NOTE — Telephone Encounter (Signed)
 Should not need a PA for lasix. Eliquis refilled

## 2024-01-02 NOTE — Telephone Encounter (Signed)
Error

## 2024-01-02 NOTE — Telephone Encounter (Signed)
 Attempted to call patient x2, voicemail not set up unable to leave message.

## 2024-01-02 NOTE — Telephone Encounter (Signed)
    Attempted to call patient x2, no answer voicemail not set up    Rx for Lasix and Eliquis sent to [facility]  Patient as OV with Dr. Royann Shivers 02/04/24

## 2024-01-02 NOTE — Telephone Encounter (Signed)
 Patient also dropped off chronic condition release of information form and patient assistance form

## 2024-01-02 NOTE — Telephone Encounter (Signed)
 Pt stopped by this afternoon stating "Water pills" needs another PA and that she really needs them. She has been out for a few days.  Eliquis refill also needed.

## 2024-01-03 ENCOUNTER — Other Ambulatory Visit (HOSPITAL_COMMUNITY): Payer: Self-pay

## 2024-01-05 NOTE — Telephone Encounter (Signed)
 Patient identification verified by 2 forms. Marilynn Rail, RN    Called and spoke to patient  Patient states:   -was told [employer] would cover Eliquis Rx   -when Eliquis Rx was [facility] to [facility] cost is ~$300  -she spoke to [employer] and was told the medication is covered   -she is unable to pay the $300   -she would like samples   -if cost remains the same she will not be taking the prescription   -does not like that she is being told different information  Informed patient:   -cost is likely related to deductible -best to contact insurance to review what deductible is and what cost will be after deductible is met   -message sent to pharmacy to see if samples available  Patient verbalized understanding, no questions at this time

## 2024-01-05 NOTE — Telephone Encounter (Signed)
 2nd attempt to call patient, no answer voicemail not set up, unable to leave message.

## 2024-01-05 NOTE — Telephone Encounter (Signed)
 Pt returning call/requesting c/b

## 2024-01-06 MED ORDER — APIXABAN 5 MG PO TABS: 5.0000 mg | ORAL_TABLET | Freq: Two times a day (BID) | ORAL | 0 refills | Status: DC

## 2024-01-06 NOTE — Telephone Encounter (Signed)
 Yes, patient can have samples. Needs to call and discuss with her insurance provider

## 2024-01-06 NOTE — Telephone Encounter (Signed)
 Attempted to call patient x2, no answer unable to leave a voicemail.     Samples placed at front desk for patient pick up

## 2024-01-07 ENCOUNTER — Telehealth: Payer: Self-pay

## 2024-01-07 NOTE — Telephone Encounter (Signed)
 Patient identification verified by 2 forms. Marilynn Rail, RN    Called and spoke to patient  Informed patient samples available at front desk  Patient states:   -will pick up tomorrow   -would like update on patient assistance form   -dropped off form on 2/28  Informed patient no updates available regarding forms  Patient verbalized understanding, no questions at this time

## 2024-01-07 NOTE — Telephone Encounter (Signed)
 [facility] is requesting a [facility] Rx for medication furosemide 40 mg tablet with specific directions. Please resend Rx to pharmacy. Thanks

## 2024-01-12 NOTE — Telephone Encounter (Signed)
 If memory serves well, I believe I signed the paperwork on Friday Feb 28 and left with Laural Golden? But I signed a bunch of assistance forms that day, niot 100% sure hers was one of them.

## 2024-01-20 DIAGNOSIS — Z1231 Encounter for screening mammogram for malignant neoplasm of breast: Secondary | ICD-10-CM | POA: Diagnosis not present

## 2024-01-20 LAB — HM MAMMOGRAPHY

## 2024-01-22 ENCOUNTER — Encounter: Payer: Self-pay | Admitting: Internal Medicine

## 2024-02-02 ENCOUNTER — Other Ambulatory Visit: Payer: Self-pay | Admitting: Cardiovascular Disease

## 2024-02-04 ENCOUNTER — Ambulatory Visit: Payer: Medicare Other | Attending: Cardiovascular Disease | Admitting: Cardiovascular Disease

## 2024-02-04 ENCOUNTER — Encounter: Payer: Self-pay | Admitting: Cardiovascular Disease

## 2024-02-04 VITALS — BP 128/72 | HR 52 | Ht 66.5 in | Wt 234.0 lb

## 2024-02-04 DIAGNOSIS — Z862 Personal history of diseases of the blood and blood-forming organs and certain disorders involving the immune mechanism: Secondary | ICD-10-CM | POA: Diagnosis not present

## 2024-02-04 DIAGNOSIS — E7849 Other hyperlipidemia: Secondary | ICD-10-CM | POA: Diagnosis not present

## 2024-02-04 DIAGNOSIS — I50812 Chronic right heart failure: Secondary | ICD-10-CM | POA: Diagnosis not present

## 2024-02-04 DIAGNOSIS — Z95818 Presence of other cardiac implants and grafts: Secondary | ICD-10-CM

## 2024-02-04 DIAGNOSIS — I480 Paroxysmal atrial fibrillation: Secondary | ICD-10-CM | POA: Diagnosis not present

## 2024-02-04 DIAGNOSIS — I441 Atrioventricular block, second degree: Secondary | ICD-10-CM

## 2024-02-04 LAB — LIPID PANEL
Chol/HDL Ratio: 3.5 ratio (ref 0.0–4.4)
Cholesterol, Total: 193 mg/dL (ref 100–199)
HDL: 55 mg/dL (ref >39)
LDL Chol Calc (NIH): 125 mg/dL — ABNORMAL HIGH (ref 0–99)
Triglycerides: 69 mg/dL (ref 0–149)
VLDL Cholesterol Cal: 13 mg/dL (ref 5–40)

## 2024-02-04 MED ORDER — APIXABAN 5 MG PO TABS: 5.0000 mg | ORAL_TABLET | Freq: Two times a day (BID) | ORAL | Status: DC

## 2024-02-04 NOTE — Patient Instructions (Signed)
 Medication Instructions:  No changes *If you need a refill on your cardiac medications before your next appointment, please call your pharmacy*  Lab Work: Lipid Panel today If you have labs (blood work) drawn today and your tests are completely normal, you will receive your results only by: MyChart Message (if you have MyChart) OR A paper copy in the mail If you have any lab test that is abnormal or we need to change your treatment, we will call you to review the results.  Follow-Up: At [facility], you and your health needs are our priority.  As part of our continuing mission to provide you with exceptional heart care, our providers are all part of one team.  This team includes your primary Cardiologist (physician) and Advanced Practice Providers or APPs (Physician Assistants and Nurse Practitioners) who all work together to provide you with the care you need, when you need it.  Your next appointment:   1 year(s)  Provider:   Thurmon Fair, MD     We recommend signing up for the patient portal called "MyChart".  Sign up information is provided on this After Visit Summary.  MyChart is used to connect with patients for Virtual Visits (Telemedicine).  Patients are able to view lab/test results, encounter notes, upcoming appointments, etc.  Non-urgent messages can be sent to your provider as well.   To learn more about what you can do with MyChart, go to [email or web address].        1st Floor: - Lobby - Registration  - Pharmacy  - Lab - Cafe  2nd Floor: - PV Lab - Diagnostic Testing (echo, CT, nuclear med)  3rd Floor: - Vacant  4th Floor: - TCTS (cardiothoracic surgery) - AFib Clinic - Structural Heart Clinic - Vascular Surgery  - Vascular Ultrasound  5th Floor: - [facility] (general and EP) - Clinical Pharmacy for coumadin, hypertension, lipid, weight-loss medications, and med management appointments    Valet parking services will be  available as well.

## 2024-02-04 NOTE — Progress Notes (Signed)
 Cardiology Office Note:    Date:  02/04/2024   ID:  Brittney Walker, DOB 1951-01-25, MRN [id number]  PCP:  Wanda Plump, MD   [facility] Providers Cardiologist:  Thurmon Fair, MD Electrophysiologist:  Maurice Small, MD     Referring MD: Wanda Plump, MD   Chief Complaint  Patient presents with   Irregular Heart Beat    History of Present Illness:    Brittney Walker is a 73 y.o. female with a hx of persistent atrial fibrillation for which she underwent electrical cardioversion on 10/11/2021.  Following the cardioversion she was noted to be severely anemic and underwent upper endoscopy that showed a large gastric ulcer, H. pylori positive.  Her echocardiogram showed dilated right heart chambers without evidence of left to right shunt, with mild-moderate reduced right ventricular systolic function, severely dilated right atrium and severe tricuspid regurgitation.  Labs were consistent with iron deficiency anemia.  She had recurrent persistent atrial fibrillation with slow ventricular response last October and underwent cardioversion 08/23/2022.  She reported substantial improvement in energy level after return to sinus rhythm.  She is referred to EP clinic and saw Dr. Nelly Laurence 01/09/2023.  He pointed out that she is likely to have nonpulmonary vein triggers related to right heart disease and recommended dofetilide for rhythm control should we choose to follow that path.  She has evidence of second-degree Mobitz type I AV block and will likely eventually need a pacemaker.  Paradoxically, she seems to have slower rates when she has atrial fibrillation than when she is in sinus rhythm.  She presents today with a heart rate of 42 bpm due to second-degree AV block Mobitz type I but is completely asymptomatic.  She denies fatigue, dizziness, syncope or shortness of breath.  She takes care of her household chores without difficulty.  She reports that at home when she checks her blood pressure  her heart rate is typically in the 50s and she never sees 40s.  She has difficulty affording the Eliquis and has been out of it for a few days.  She is waiting on her patient assistance application forms.  She has not had any falls or bleeding problems.  Loop interrogation continues to show relatively frequent episodes of pauses of 3-4 seconds, never symptomatic.  Hard to say exactly what the burden of atrial fibrillation is, since many of the episodes labeled as A-fib are actually irregular rhythms due to Wenckebach cycles.  In June 2023 she also had lower extremity arterial Dopplers (ordered by Dr. Cristie Hem, her podiatrist, for pain and numbness in her legs) with no evidence of arterial obstruction.  Past Medical History:  Diagnosis Date   Atrial fibrillation (HCC) 10/04/2021   Carotid bruit    w/ neg dopplers    CHF (congestive heart failure) (HCC)    History of gastric ulcer 10/04/2021   Hyperthyroidism     s/p RAI 2004   Normocytic anemia    Persistent atrial fibrillation (HCC)    a. Dx 07/2021; b. CHA2DS2VASc = 3-->Eliquis; c. 10/2021 s/p DCCV.   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   Vitamin D deficiency     Past Surgical History:  Procedure Laterality Date   ABDOMINAL HYSTERECTOMY  11/05/1995   TAH-LEIOMYOMATA-- no BSO   BIOPSY  12/25/2021   Procedure: BIOPSY;  Surgeon: Sherrilyn Rist, MD;  Location: [facility] ENDOSCOPY;  Service: Gastroenterology;;   BIOPSY THYROID      bx of largest nodule 2005,  benign   BREAST SURGERY     x's 2--fibrocystic dz   CARDIOVERSION N/A 10/10/2021   Procedure: CARDIOVERSION;  Surgeon: Thurmon Fair, MD;  Location: [facility] ENDOSCOPY;  Service: Cardiovascular;  Laterality: N/A;   CARDIOVERSION N/A 08/23/2022   Procedure: CARDIOVERSION;  Surgeon: Parke Poisson, MD;  Location: [facility] ENDOSCOPY;  Service: Cardiovascular;  Laterality: N/A;   CATARACT EXTRACTION, BILATERAL Bilateral    COLONOSCOPY WITH PROPOFOL N/A 12/25/2021    Procedure: COLONOSCOPY WITH PROPOFOL;  Surgeon: Sherrilyn Rist, MD;  Location: [facility] ENDOSCOPY;  Service: Gastroenterology;  Laterality: N/A;   ESOPHAGOGASTRODUODENOSCOPY (EGD) WITH PROPOFOL N/A 10/12/2021   Procedure: ESOPHAGOGASTRODUODENOSCOPY (EGD) WITH PROPOFOL;  Surgeon: Sherrilyn Rist, MD;  Location: [facility] ENDOSCOPY;  Service: Gastroenterology;  Laterality: N/A;   ESOPHAGOGASTRODUODENOSCOPY (EGD) WITH PROPOFOL N/A 12/25/2021   Procedure: ESOPHAGOGASTRODUODENOSCOPY (EGD) WITH PROPOFOL;  Surgeon: Sherrilyn Rist, MD;  Location: [facility] ENDOSCOPY;  Service: Gastroenterology;  Laterality: N/A;   POLYPECTOMY  12/25/2021   Procedure: POLYPECTOMY;  Surgeon: Sherrilyn Rist, MD;  Location: [facility] ENDOSCOPY;  Service: Gastroenterology;;   TONSILLECTOMY      Current Medications: Current Meds  Medication Sig   apixaban (ELIQUIS) 5 MG TABS tablet Take 1 tablet (5 mg total) by mouth 2 (two) times daily.   apixaban (ELIQUIS) 5 MG TABS tablet Take 1 tablet (5 mg total) by mouth 2 (two) times daily.   apixaban (ELIQUIS) 5 MG TABS tablet Take 1 tablet (5 mg total) by mouth 2 (two) times daily.   Ascorbic Acid (VITAMIN C) 1000 MG tablet Take 1,000 mg by mouth daily.   calcium carbonate (OS-CAL - DOSED IN MG OF ELEMENTAL CALCIUM) 1250 MG tablet Take 1,250 mg by mouth daily with breakfast.   cholecalciferol (VITAMIN D) 1000 UNITS tablet Take 1,000 Units by mouth daily.   furosemide (LASIX) 20 MG tablet As needed patient may take 40 MG Lasix PRN by mouth QD x 4 days as directed per [facility]lev[facility] Research HF Study. PLEASE DO NOT REMOVE PRESCRIPTION PRN ONLY IF DIRECTED   furosemide (LASIX) 40 MG tablet Take 1 tablet (40 mg total) by mouth daily. Please keep 02/04/24 Appointment with Dr. Royann Shivers   Multiple Vitamin (MULTIVITAMIN WITH MINERALS) TABS tablet Take 1 tablet by mouth in the morning. Alive   potassium chloride (KLOR-CON) 10 MEQ tablet Patient may take additional 20 mEq potassium BY MOUTH DAILY AS DIRECTED  for Alleviate HF Research Study. PLEASE DO NOT REMOVE PRESCRIPTION PRN ONLY IF DIRECTED BY STUDY TEAM   potassium chloride SA (KLOR-CON M) 20 MEQ tablet Take 1 tablet (20 mEq total) by mouth daily. Please keep scheduled appointment for future refills. Thank you.   rosuvastatin (CRESTOR) 40 MG tablet Take 1 tablet (40 mg total) by mouth at bedtime.   vitamin A 16109 UNIT capsule Take 10,000 Units by mouth daily.   vitamin B-12 (CYANOCOBALAMIN) 1000 MCG tablet Take 1,000 mcg by mouth daily.   vitamin E 400 UNIT capsule Take 400 Units by mouth daily.   zinc gluconate 50 MG tablet Take 50 mg by mouth daily.     Allergies:   Patient has no known allergies.     Family History: The patient's family history includes Breast cancer (age of onset: 46) in her sister; Heart attack in her father; Heart disease in her mother; Hypertension in her father and mother; Seizures in her father. There is no history of Colon cancer, Pancreatic cancer, Esophageal cancer, Prostate cancer, or Rectal cancer.  ROS:  Please see the history of present illness.     All other systems reviewed and are negative.  EKGs/Labs/Other Studies Reviewed:    The following studies were reviewed today: Echocardiogram 11/08/2021   1. Left ventricular ejection fraction, by estimation, is 60 to 65%. The  left ventricle has normal function. The left ventricle has no regional  wall motion abnormalities. There is mild left ventricular hypertrophy of  the septal segment. Left ventricular  diastolic parameters are consistent with Grade I diastolic dysfunction  (impaired relaxation). There is the interventricular septum is flattened  in systole and diastole, consistent with right ventricular pressure and  volume overload.   2. Right ventricular systolic function is mildly reduced. The right  ventricular size is moderately enlarged. There is moderately elevated  pulmonary artery systolic pressure. The estimated right ventricular   systolic pressure is 47.5 mmHg.   3. Right atrial size was moderately dilated.   4. The mitral valve is normal in structure. Mild mitral valve  regurgitation. No evidence of mitral stenosis. There is mild holosystolic  prolapse of the middle segment of the anterior leaflet of the mitral  valve.   5. Tricuspid valve regurgitation is moderate to severe.   6. The aortic valve is normal in structure. Aortic valve regurgitation is  not visualized. No aortic stenosis is present.   7. The inferior vena cava is dilated in size with <50% respiratory  variability, suggesting right atrial pressure of 15 mmHg.   Comparison(s): Prior images reviewed side by side.    EKG:    EKG Interpretation Date/Time:  Wednesday February 04 2024 10:05:42 EDT Ventricular Rate:  42 PR Interval:    QRS Duration:  90 QT Interval:  462 QTC Calculation: 385 R Axis:   -20  Text Interpretation: Sinus bradycardia with 2nd degree A-V block (Mobitz I) Cannot rule out Anterior infarct (cited on or before 03-Sep-2021) When compared with ECG of 29-Aug-2022 14:08, Sinus rhythm is now with 2nd degree A-V block (Mobitz I) Confirmed by Palmira Stickle [phone]) on 02/04/2024 10:16:18 AM          Recent Labs: 06/18/2023: B Natriuretic Peptide 159.0; BUN 17; Creatinine, Ser 0.89; Hemoglobin 12.2; Platelets 132; Potassium 3.8; Sodium 139 10/01/2023: ALT 11  Recent Lipid Panel    Component Value Date/Time   CHOL 222 (H) 10/01/2023 1520   TRIG 62 10/01/2023 1520   HDL 69 10/01/2023 1520   CHOLHDL 3.2 10/01/2023 1520   VLDL 20.0 05/26/2023 1110   LDLCALC 138 (H) 10/01/2023 1520   LDLDIRECT 141.1 05/01/2009 0947     Risk Assessment/Calculations:    CHA2DS2-VASc Score = 4  The patient's score is based upon: CHF History: 1 HTN History: 0 Diabetes History: 0 Stroke History: 0 Vascular Disease History: 1 Age Score: 1 Gender Score: 1       ASSESSMENT AND PLAN: Paroxysmal Atrial Fibrillation (ICD10:  I48.0) The  patient's CHA2DS2-VASc score is 4, indicating a 4.8% annual risk of stroke.    Secondary Hypercoagulable State (ICD10:  D68.69) The patient is at significant risk for stroke/thromboembolism based upon her CHA2DS2-VASc Score of 4.  Continue Apixaban (Eliquis).      Physical Exam:    VS:  BP 128/72 (BP Location: Left Arm, Patient Position: Sitting, Cuff Size: Large)   Pulse (!) 52   Ht 5' 6.5" (1.689 m)   Wt 234 lb (106.1 kg)   LMP 11/05/1995   SpO2 96%   BMI 37.20 kg/m     Wt Readings from Last  3 Encounters:  02/04/24 234 lb (106.1 kg)  10/01/23 234 lb 2 oz (106.2 kg)  08/13/23 236 lb 12 oz (107.4 kg)     General: Alert, oriented x3, no distress, severely obese Head: no evidence of trauma, PERRL, EOMI, no exophtalmos or lid lag, no myxedema, no xanthelasma; normal ears, nose and oropharynx Neck: normal jugular venous pulsations and no hepatojugular reflux; brisk carotid pulses without delay and no carotid bruits Chest: clear to auscultation, no signs of consolidation by percussion or palpation, normal fremitus, symmetrical and full respiratory excursions Cardiovascular: normal position and quality of the apical impulse, regularly irregular rhythm (Wenckebach cycles), normal first and second heart sounds, no murmurs, rubs or gallops Abdomen: no tenderness or distention, no masses by palpation, no abnormal pulsatility or arterial bruits, normal bowel sounds, no hepatosplenomegaly Extremities: no clubbing, cyanosis or edema; 2+ radial, ulnar and brachial pulses bilaterally; 2+ right femoral, posterior tibial and dorsalis pedis pulses; 2+ left femoral, posterior tibial and dorsalis pedis pulses; no subclavian or femoral bruits Neurological: grossly nonfocal Psych: Normal mood and affect    ASSESSMENT:    1. PAF (paroxysmal atrial fibrillation) (HCC)   2. Other hyperlipidemia   3. Second degree AV block, Mobitz type I   4. Chronic right-sided heart failure (HCC)   5. Status post  placement of implantable loop recorder   6. History of anemia      PLAN:    In order of problems listed above:  Afib: Had a single episode of persistent atrial fibrillation and required cardioversion and has mostly maintained normal rhythm since then.  Very low burden of paroxysmal atrial fibrillation, probably exaggerated by her loop recorder since many of the events are actually irregular rhythms due to Wenckebach cycles.  Tends to have a slower rate in atrial fibrillation that she does in sinus rhythm.  She is not on any rate control medications due to second-degree AV block Mobitz type I.  Appropriately anticoagulated with Eliquis, gave her samples today while we figure out the patient assistance issues.   Underlying cause for atrial fibrillation appears to be right heart dilation.  No history of thromboembolic disease.   Second-degree AV block: As she is asymptomatic we will continue to delay pacemaker implantation, although this will eventually be necessary as she gets older.  Asked her to call promptly if she develops dizziness or syncope or fatigue.  Avoid any medications with negative chronotropic effect. Chronic right heart failure: She has no edema and appears clinically euvolemic on of pretty low dose of diuretic.  Pulmonary pressure was moderately elevated.  Sleep study was never performed moderate to severe tricuspid regurgitation is most likely secondary.  Adjust diuretics for relief of edema.   Loop recorder: Placed as part of ALLEVIATE-HF trial. Iron deficiency anemia: Has not recurred even though she is on continued anticoagulation.  Most recent hemoglobin was 12.2 Gastric ulcer, H. pylori positive: Expected to be healed at this point after appropriate antibiotic therapy. Mild obesity: Discussed the relationship between weight and A-fib burden.  Encouraged weight loss.     Medication Adjustments/Labs and Tests Ordered: Current medicines are reviewed at length with the patient  today.  Concerns regarding medicines are outlined above.  Orders Placed This Encounter  Procedures   Lipid panel   EKG 12-Lead   Meds ordered this encounter  Medications   apixaban (ELIQUIS) 5 MG TABS tablet    Sig: Take 1 tablet (5 mg total) by mouth 2 (two) times daily.    Dispense:  42 tablet    Lot Number?:   [id number]    Expiration Date?:   05/03/2025    Quantity:   42    Patient Instructions  Medication Instructions:  No changes *If you need a refill on your cardiac medications before your next appointment, please call your pharmacy*  Lab Work: Lipid Panel today If you have labs (blood work) drawn today and your tests are completely normal, you will receive your results only by: MyChart Message (if you have MyChart) OR A paper copy in the mail If you have any lab test that is abnormal or we need to change your treatment, we will call you to review the results.  Follow-Up: At [facility], you and your health needs are our priority.  As part of our continuing mission to provide you with exceptional heart care, our providers are all part of one team.  This team includes your primary Cardiologist (physician) and Advanced Practice Providers or APPs (Physician Assistants and Nurse Practitioners) who all work together to provide you with the care you need, when you need it.  Your next appointment:   1 year(s)  Provider:   Thurmon Fair, MD     We recommend signing up for the patient portal called "MyChart".  Sign up information is provided on this After Visit Summary.  MyChart is used to connect with patients for Virtual Visits (Telemedicine).  Patients are able to view lab/test results, encounter notes, upcoming appointments, etc.  Non-urgent messages can be sent to your provider as well.   To learn more about what you can do with MyChart, go to [email or web address].        1st Floor: - Lobby - Registration  - Pharmacy  - Lab - Cafe  2nd Floor: - PV  Lab - Diagnostic Testing (echo, CT, nuclear med)  3rd Floor: - Vacant  4th Floor: - TCTS (cardiothoracic surgery) - AFib Clinic - Structural Heart Clinic - Vascular Surgery  - Vascular Ultrasound  5th Floor: - HeartCare Cardiology (general and EP) - Clinical Pharmacy for coumadin, hypertension, lipid, weight-loss medications, and med management appointments    Valet parking services will be available as well.      Signed, Thurmon Fair, MD  02/04/2024 12:29 PM    [facility]

## 2024-02-06 ENCOUNTER — Telehealth: Payer: Self-pay | Admitting: Emergency Medicine

## 2024-02-06 DIAGNOSIS — E7849 Other hyperlipidemia: Secondary | ICD-10-CM

## 2024-02-06 NOTE — Telephone Encounter (Signed)
 Called pt phone, no voicemail. Called friend- Sheila Oats (dpr)  Requesting a call back to go over results- left call back number.  Croitoru, Rachelle Hora, MD to Wanda Plump, MD  Me      02/05/24  1:17 PM Result Note Cholesterol numbers are similar to what they have been over the years with an LDL in the 120s.  Previous imaging studies have shown evidence of aortic atherosclerosis so ideally would like the LDL less than 100. On maximum rosuvastatin dose. Would advise adding ezetimibe 10 mg daily and checking a lipid profile in 2-3 months.

## 2024-02-10 MED ORDER — EZETIMIBE 10 MG PO TABS: 10.0000 mg | ORAL_TABLET | Freq: Every day | ORAL | 3 refills | Status: AC

## 2024-02-10 NOTE — Telephone Encounter (Signed)
 Called the patient. Informed of results and all information on previous message.   Sent RX to preferred pharmacy. Lipid panel ordered. Will call when it is time to get labs drawn.

## 2024-02-10 NOTE — Addendum Note (Signed)
 Addended by: Scheryl Marten on: 02/10/2024 04:40 PM   Modules accepted: Orders

## 2024-03-14 ENCOUNTER — Other Ambulatory Visit: Payer: Self-pay | Admitting: Cardiovascular Disease

## 2024-03-17 IMAGING — US US THYROID
2 series · 13 of 25 positions shown · non-contrast
Comparison: Most recent prior thyroid ultrasound 01/12/2019; more
remote thyroid ultrasound 12/13/2014

CLINICAL DATA: Prior ultrasound follow-up. History of multinodular
thyroid goiter. Patient previously underwent aspiration of
left-sided thyroid nodule in 5171 and isthmic nodule in 9000.

EXAM:
THYROID ULTRASOUND
TECHNIQUE: Ultrasound examination of the thyroid gland and adjacent soft
tissues was performed.

[Series 1: us thyroid · 12 of 52 slices shown (1 of 2)]
[im 1/52]
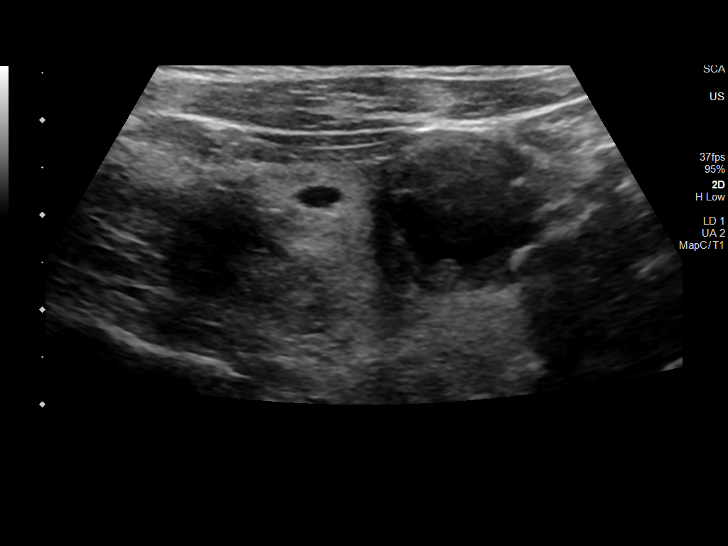
[im 5/52]
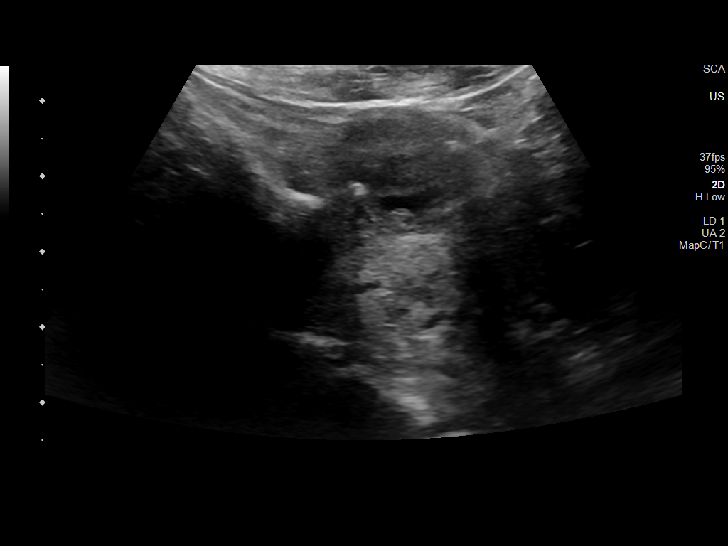
[im 9/52]
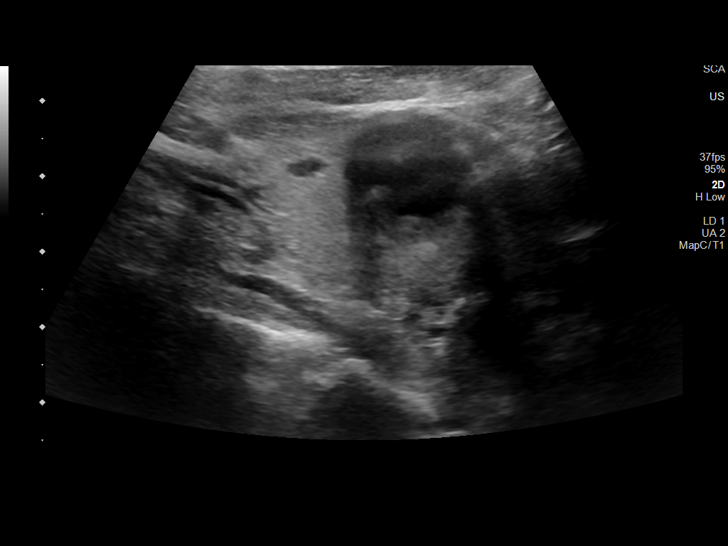
[im 14/52]
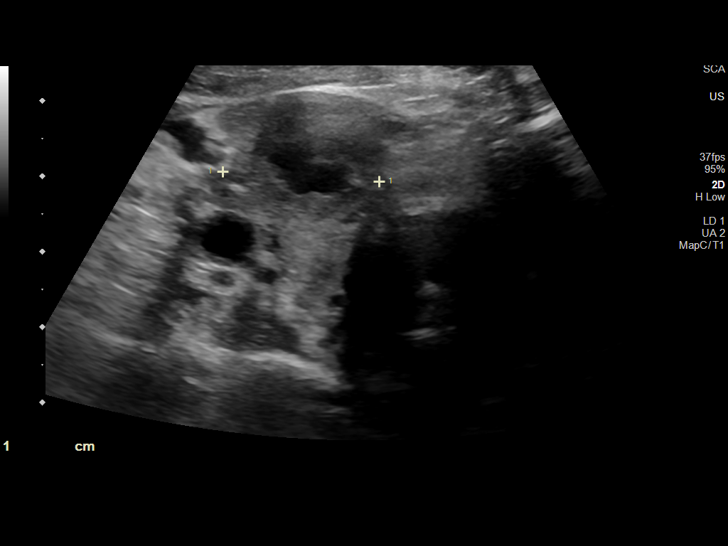
[im 18/52]
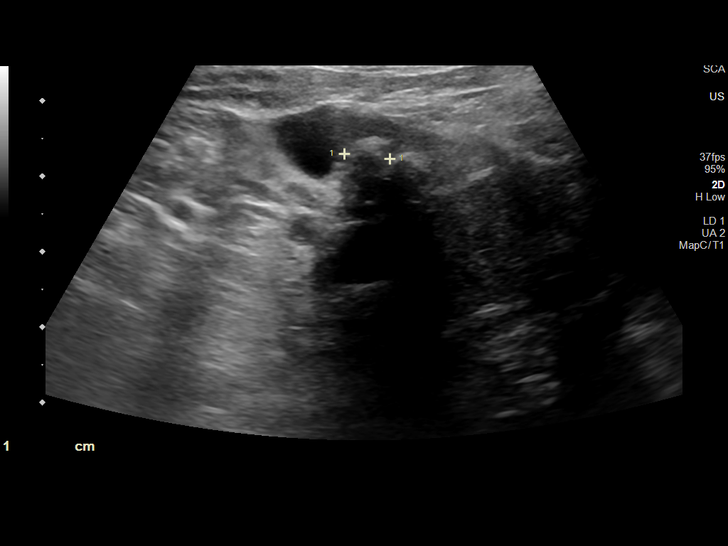
[im 23/52]
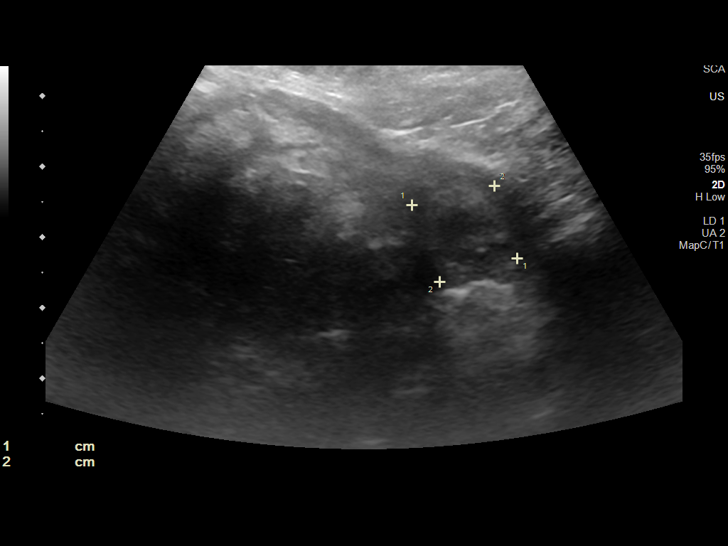
[im 27/52]
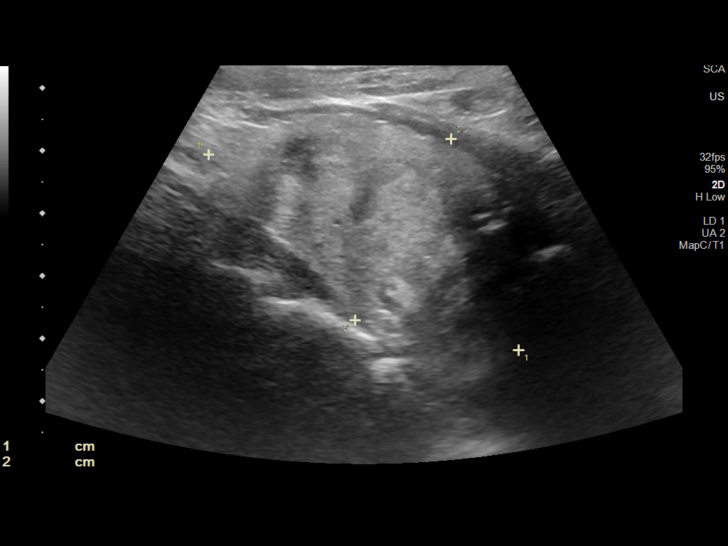
[im 32/52]
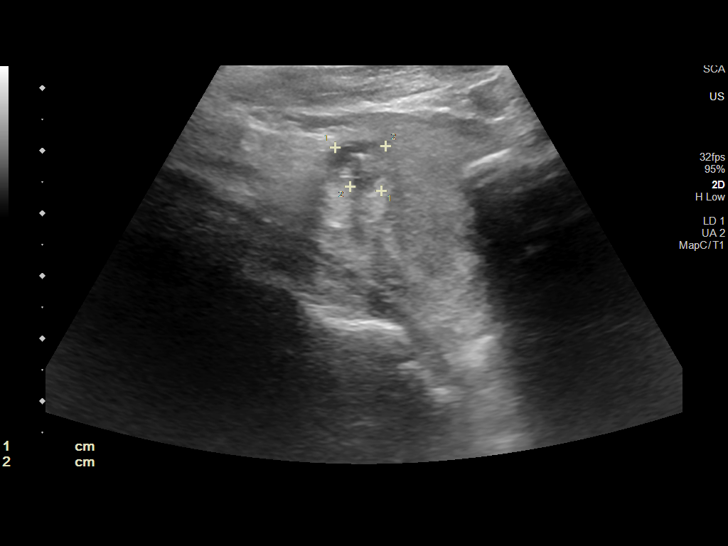
[im 36/52]
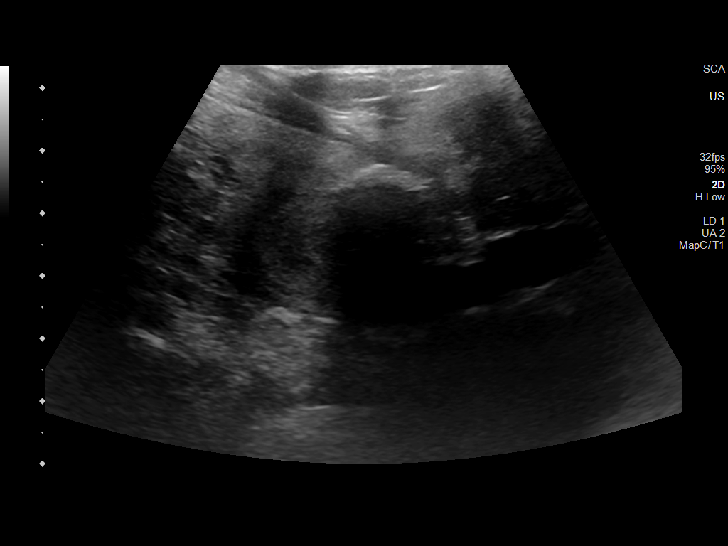
[im 40/52]
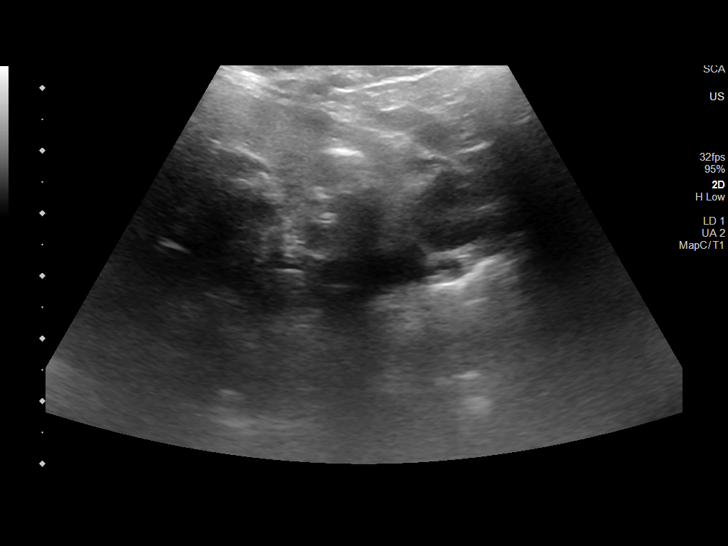
[im 45/52]
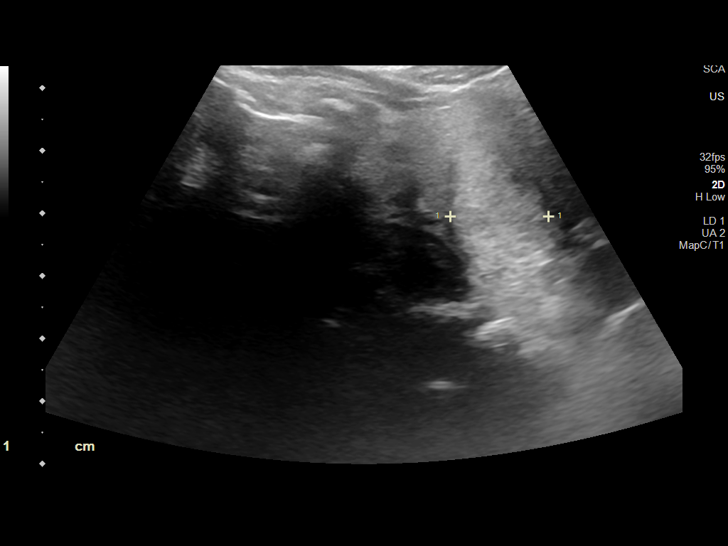
[im 49/52]
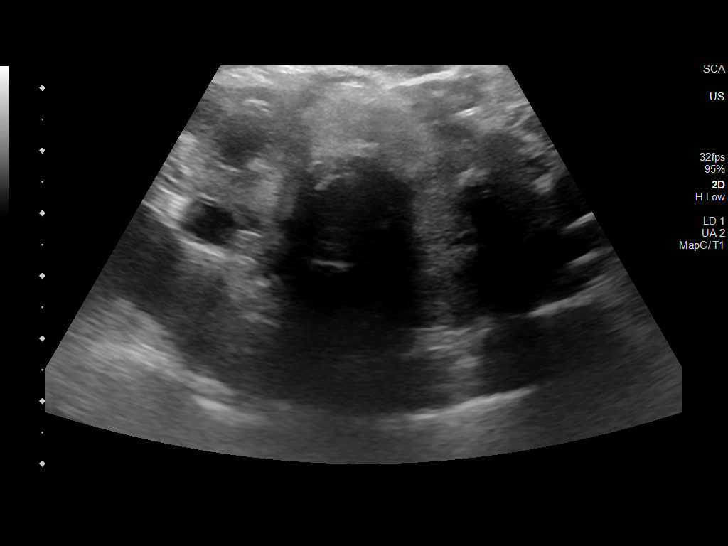

[Series 2: us thyroid · 1 of 3 slices shown (2 of 2)]
[im 1/3]
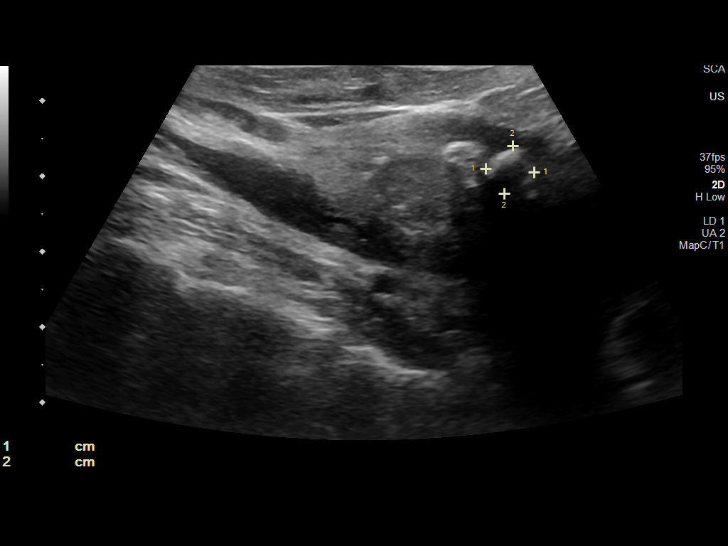

[13 of 25 positions shown; findings below may reference images not displayed]

FINDINGS: Parenchymal Echotexture: Moderately heterogenous

Isthmus: 1 cm

Right lobe: 5.3 x 2.9 x 2.1 cm

Left lobe: 5.2 x 3.0 x 1.8 cm

_________________________________________________________

Estimated total number of nodules >/= 1 cm: 5

Number of spongiform nodules >/=  2 cm not described below (TR1): 0

Number of mixed cystic and solid nodules >/= 1.5 cm not described
below (TR2): 0

_________________________________________________________

Diffusely enlarged, heterogeneous and multinodular thyroid gland.

Nodule # 6: The previously biopsied nodule in the thyroid isthmus is
no longer identified and may have involuted in the interim.

Nodule # 8: The previously biopsied nodule in the left inferior
gland is similar to slightly smaller on today's examination at
by 2.6 x 1.6 cm compared to 2.7 x 2.5 x 1.8 cm previously.

Numerous remaining dystrophic calcifications, nodules and areas of
pseudo nodularity are present throughout the entire thyroid gland.
No substantive changes are evident compared to prior imaging dating
back to 7505. Greater than 5 year stability is consistent with
benignity.
IMPRESSION: No significant interval change in the appearance of this extremely
complex and multinodular thyroid gland across multiple prior
studies. Greater than 5 year stability is consistent with benignity.
No further follow-up is recommended.

## 2024-03-23 DIAGNOSIS — Z006 Encounter for examination for normal comparison and control in clinical research program: Secondary | ICD-10-CM

## 2024-03-27 ENCOUNTER — Other Ambulatory Visit: Payer: Self-pay | Admitting: Internal Medicine

## 2024-04-05 NOTE — Research (Signed)
 Alleviate HF Research [facility].  No adverse events or cardiovascular medication changes to report at this time.

## 2024-04-21 DIAGNOSIS — Z780 Asymptomatic menopausal state: Secondary | ICD-10-CM | POA: Diagnosis not present

## 2024-04-21 LAB — HM DEXA SCAN: HM Dexa Scan: NORMAL

## 2024-05-26 ENCOUNTER — Other Ambulatory Visit: Payer: Self-pay | Admitting: Emergency Medicine

## 2024-05-26 DIAGNOSIS — E7849 Other hyperlipidemia: Secondary | ICD-10-CM

## 2024-05-26 NOTE — Telephone Encounter (Signed)
 Left message with call back number stating that pt is due for repeat lab work It is time for your Fasting Lipid panel to be drawn. No eating or drinking after midnight- water is ok. You can have this drawn at any [facility]- or come to our office at [address] [place], [place] [address], lab is on the 1st floor.

## 2024-05-31 ENCOUNTER — Encounter: Payer: Self-pay | Admitting: Pharmacist

## 2024-05-31 NOTE — Progress Notes (Signed)
 Pharmacy Quality Measure Review  This patient is appearing on a report for being at risk of failing the adherence measure for cholesterol (statin) medications this calendar year.   Medication: rosuvastatin  Last fill date: 01/01/2024 for 90 day supply per adherence report.  Rx was sent to her pharmacy in May but patient did not pick up.  I tried to call patient to discuss but her number went straight to VM and mailbox was not set up.  I left a message on the VM of an alternative contact - Rubie Hummer - just asked to have patient call our office CB# [phone]  Contacted pharmacy to facilitate refills. - spoke with [facility].    Madelin Ray, PharmD Clinical Pharmacist [facility]  Population Health [phone]

## 2024-06-04 ENCOUNTER — Other Ambulatory Visit (HOSPITAL_COMMUNITY)
Admission: RE | Admit: 2024-06-04 | Discharge: 2024-06-04 | Disposition: A | Discharge status: Home / Self Care | Source: Ambulatory Visit | Attending: Cardiovascular Disease | Admitting: Cardiovascular Disease

## 2024-06-04 DIAGNOSIS — E7849 Other hyperlipidemia: Secondary | ICD-10-CM | POA: Diagnosis not present

## 2024-06-05 ENCOUNTER — Ambulatory Visit: Payer: Self-pay | Admitting: Cardiovascular Disease

## 2024-06-05 LAB — LIPID PANEL
Chol/HDL Ratio: 2.7 ratio (ref 0.0–4.4)
Cholesterol, Total: 169 mg/dL (ref 100–199)
HDL: 63 mg/dL (ref >39)
LDL Chol Calc (NIH): 89 mg/dL (ref 0–99)
Triglycerides: 92 mg/dL (ref 0–149)
VLDL Cholesterol Cal: 17 mg/dL (ref 5–40)

## 2024-06-08 NOTE — Telephone Encounter (Signed)
 Left message on Brittney Walker's voicemail (dpr) stating that we have results for the patient, left call back number and stated that I will also mail the results to the patient.

## 2024-07-21 ENCOUNTER — Ambulatory Visit (INDEPENDENT_AMBULATORY_CARE_PROVIDER_SITE_OTHER)

## 2024-07-21 DIAGNOSIS — I4819 Other persistent atrial fibrillation: Secondary | ICD-10-CM | POA: Diagnosis not present

## 2024-08-02 LAB — CUP PACEART REMOTE DEVICE CHECK
Date Time Interrogation Session: 20250917164047
Implantable Pulse Generator Implant Date: 20221208

## 2024-08-03 ENCOUNTER — Telehealth: Payer: Self-pay | Admitting: Cardiovascular Disease

## 2024-08-03 NOTE — Telephone Encounter (Signed)
 Copied from CRM [id number]. Topic: Medicare AWV >> Aug 03, 2024 10:16 AM Nathanel DEL wrote: Reason for CRM: Unable to [facility] 08/03/24 to change AWV appt on 08/17/24 from in person to telephone due to [facility] working remote.  Please call back to confirm khc  Nathanel Paschal; Care Guide Ambulatory Clinical Support [place] [facility] Direct Dial: [phone]

## 2024-08-03 NOTE — Progress Notes (Signed)
 Remote Loop Recorder Transmission

## 2024-08-06 ENCOUNTER — Ambulatory Visit: Payer: Self-pay | Admitting: Cardiovascular Disease

## 2024-08-17 ENCOUNTER — Encounter: Payer: Self-pay | Admitting: Internal Medicine

## 2024-08-17 ENCOUNTER — Ambulatory Visit

## 2024-08-17 ENCOUNTER — Ambulatory Visit: Admitting: Internal Medicine

## 2024-08-17 VITALS — Ht 66.5 in | Wt 232.0 lb

## 2024-08-17 VITALS — BP 108/86 | HR 52 | Temp 97.8°F | Resp 16 | Ht 66.5 in | Wt 232.4 lb

## 2024-08-17 DIAGNOSIS — L603 Nail dystrophy: Secondary | ICD-10-CM

## 2024-08-17 DIAGNOSIS — R739 Hyperglycemia, unspecified: Secondary | ICD-10-CM

## 2024-08-17 DIAGNOSIS — Z8742 Personal history of other diseases of the female genital tract: Secondary | ICD-10-CM | POA: Diagnosis not present

## 2024-08-17 DIAGNOSIS — Z0000 Encounter for general adult medical examination without abnormal findings: Secondary | ICD-10-CM

## 2024-08-17 DIAGNOSIS — E559 Vitamin D deficiency, unspecified: Secondary | ICD-10-CM

## 2024-08-17 DIAGNOSIS — Z0001 Encounter for general adult medical examination with abnormal findings: Secondary | ICD-10-CM

## 2024-08-17 DIAGNOSIS — Z23 Encounter for immunization: Secondary | ICD-10-CM

## 2024-08-17 DIAGNOSIS — I5033 Acute on chronic diastolic (congestive) heart failure: Secondary | ICD-10-CM | POA: Diagnosis not present

## 2024-08-17 DIAGNOSIS — I4819 Other persistent atrial fibrillation: Secondary | ICD-10-CM | POA: Diagnosis not present

## 2024-08-17 LAB — CBC WITH DIFFERENTIAL/PLATELET
Basophils Absolute: 0 K/uL (ref 0.0–0.1)
Basophils Relative: 0.4 % (ref 0.0–3.0)
Eosinophils Absolute: 0.1 K/uL (ref 0.0–0.7)
Eosinophils Relative: 1.9 % (ref 0.0–5.0)
HCT: 36.8 % (ref 36.0–46.0)
Hemoglobin: 12 g/dL (ref 12.0–15.0)
Lymphocytes Relative: 18.5 % (ref 12.0–46.0)
Lymphs Abs: 1.3 K/uL (ref 0.7–4.0)
MCHC: 32.6 g/dL (ref 30.0–36.0)
MCV: 92.2 fl (ref 78.0–100.0)
Monocytes Absolute: 0.8 K/uL (ref 0.1–1.0)
Monocytes Relative: 11.8 % (ref 3.0–12.0)
Neutro Abs: 4.6 K/uL (ref 1.4–7.7)
Neutrophils Relative %: 67.4 % (ref 43.0–77.0)
Platelets: 149 K/uL — ABNORMAL LOW (ref 150.0–400.0)
RBC: 3.99 Mil/uL (ref 3.87–5.11)
RDW: 13.7 % (ref 11.5–15.5)
WBC: 6.8 K/uL (ref 4.0–10.5)

## 2024-08-17 LAB — COMPREHENSIVE METABOLIC PANEL WITH GFR
ALT: 24 U/L (ref 0–35)
AST: 21 U/L (ref 0–37)
Albumin: 4.2 g/dL (ref 3.5–5.2)
Alkaline Phosphatase: 58 U/L (ref 39–117)
BUN: 15 mg/dL (ref 6–23)
CO2: 34 meq/L — ABNORMAL HIGH (ref 19–32)
Calcium: 9.1 mg/dL (ref 8.4–10.5)
Chloride: 102 meq/L (ref 96–112)
Creatinine, Ser: 0.72 mg/dL (ref 0.40–1.20)
GFR: 82.24 mL/min (ref >60.00)
Glucose, Bld: 94 mg/dL (ref 70–99)
Potassium: 3.8 meq/L (ref 3.5–5.1)
Sodium: 141 meq/L (ref 135–145)
Total Bilirubin: 1 mg/dL (ref 0.2–1.2)
Total Protein: 7 g/dL (ref 6.0–8.3)

## 2024-08-17 LAB — BRAIN NATRIURETIC PEPTIDE: Pro B Natriuretic peptide (BNP): 225 pg/mL — ABNORMAL HIGH (ref 0.0–100.0)

## 2024-08-17 LAB — VITAMIN D 25 HYDROXY (VIT D DEFICIENCY, FRACTURES): VITD: 46.37 ng/mL (ref 30.00–100.00)

## 2024-08-17 LAB — HEMOGLOBIN A1C: Hgb A1c MFr Bld: 6 % (ref 4.6–6.5)

## 2024-08-17 NOTE — Assessment & Plan Note (Addendum)
 Here for CPX   Other issues: Congestive heart failure with exacerbation. Atrial fibrillation, AV block,    Saw cardiology April 2025, noted to have a very low burden, not on rate control drugs due to h/o second-degree AV block Mobitz type I.  Anticoagulated.  For now they are delaying the pacemaker in [place] d/t AV block. At this point, she has some peripheral edema and question of increased JVD indicating mild CHF exacerbation. Oxygen levels stable. - Order CMP, BNP, and CBC. - Further advised for results, consider additional doses of Lasix  as needed Hyperlipidemia Well-controlled on rosuvastatin  and Zetia   LDL 89, cardiology rec a LDL of less than 100.   Nail care: Unable to provide self nail care.  Referred to podiatry RTC 3 months

## 2024-08-17 NOTE — Patient Instructions (Addendum)
 GO TO THE LAB :  Get the blood work    Then, go to the front desk for the checkout Please make an appointment for a checkup in 3 months   Referrals: Podiatry for feet care Gynecology  Your last bone density test was April 21, 2024, normal. Continue taking vitamin D   After the blood work, I may need to adjust your medications, seems like you are retaining fluid.  Watch your salt intake.  Vaccines I recommend: COVID-vaccine and RSV

## 2024-08-17 NOTE — Progress Notes (Addendum)
 "  Subjective:    Patient ID: Brittney Walker, female    DOB: 1951/08/18, 73 y.o.   MRN: [id number]  DOS:  08/17/2024 CPX, multiple other issues addressed  Discussed the use of AI scribe software for clinical note transcription with the patient, who gave verbal consent to proceed.  History of Present Illness  States she is not feeling well in general .Symptoms includes - Rhinorrhea and pharyngitis, both improved since onset - Persistent nocturnal cough - No recent fever - Asthenia with low energy levels - some nausea with sensation of impending emesis - No vomiting, diarrhea, or hematochezia  Also, lower extremity edema - Significant bilateral lower extremity swelling, worsening by end of day - No chest pain or dyspnea  Genitourinary symptoms - No hematuria or dysuria    Wt Readings from Last 3 Encounters:  08/17/24 232 lb (105.2 kg)  08/17/24 232 lb 6 oz (105.4 kg)  02/04/24 234 lb (106.1 kg)     Review of Systems  Other than above, a 14 point review of systems is negative     Past Medical History:  Diagnosis Date   Atrial fibrillation (HCC) 10/04/2021   Carotid bruit    w/ neg dopplers    CHF (congestive heart failure) (HCC)    History of gastric ulcer 10/04/2021   Hyperthyroidism     s/p RAI 2004   Normocytic anemia    Persistent atrial fibrillation (HCC)    a. Dx 07/2021; b. CHA2DS2VASc = 3-->Eliquis ; c. 10/2021 s/p DCCV.   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   Vitamin D  deficiency     Past Surgical History:  Procedure Laterality Date   ABDOMINAL HYSTERECTOMY  11/05/1995   TAH-LEIOMYOMATA-- no BSO   BIOPSY  12/25/2021   Procedure: BIOPSY;  Surgeon: Legrand Victory LITTIE DOUGLAS, MD;  Location: [facility];  Service: Gastroenterology;;   BIOPSY THYROID       bx of largest nodule 2005, benign   BREAST SURGERY     x's 2--fibrocystic dz   CARDIOVERSION N/A 10/10/2021   Procedure: CARDIOVERSION;  Surgeon: Francyne Headland, MD;  Location: [facility]  ENDOSCOPY;  Service: Cardiovascular;  Laterality: N/A;   CARDIOVERSION N/A 08/23/2022   Procedure: CARDIOVERSION;  Surgeon: Loni Soyla LABOR, MD;  Location: [facility] ENDOSCOPY;  Service: Cardiovascular;  Laterality: N/A;   CATARACT EXTRACTION, BILATERAL Bilateral    COLONOSCOPY WITH PROPOFOL  N/A 12/25/2021   Procedure: COLONOSCOPY WITH PROPOFOL ;  Surgeon: Legrand Victory LITTIE DOUGLAS, MD;  Location: [facility];  Service: Gastroenterology;  Laterality: N/A;   ESOPHAGOGASTRODUODENOSCOPY (EGD) WITH PROPOFOL  N/A 10/12/2021   Procedure: ESOPHAGOGASTRODUODENOSCOPY (EGD) WITH PROPOFOL ;  Surgeon: Legrand Victory LITTIE DOUGLAS, MD;  Location: [facility] ENDOSCOPY;  Service: Gastroenterology;  Laterality: N/A;   ESOPHAGOGASTRODUODENOSCOPY (EGD) WITH PROPOFOL  N/A 12/25/2021   Procedure: ESOPHAGOGASTRODUODENOSCOPY (EGD) WITH PROPOFOL ;  Surgeon: Legrand Victory LITTIE DOUGLAS, MD;  Location: [facility];  Service: Gastroenterology;  Laterality: N/A;   POLYPECTOMY  12/25/2021   Procedure: POLYPECTOMY;  Surgeon: Legrand Victory LITTIE DOUGLAS, MD;  Location: [facility];  Service: Gastroenterology;;   TONSILLECTOMY      Current Outpatient Medications  Medication Instructions   apixaban  (ELIQUIS ) 5 mg, Oral, 2 times daily   calcium  carbonate (OS-CAL - DOSED IN MG OF ELEMENTAL CALCIUM ) 1,250 mg, Daily with breakfast   cholecalciferol (VITAMIN D ) 1,000 Units, Daily   cyanocobalamin  (VITAMIN B12) 1,000 mcg, Daily   ezetimibe  (ZETIA ) 10 mg, Oral, Daily   furosemide  (LASIX ) 20 MG tablet As needed patient may take 40 MG Lasix  PRN  by mouth QD x 4 days as directed per Alleviate Research HF Study. PLEASE DO NOT REMOVE PRESCRIPTION PRN ONLY IF DIRECTED   furosemide  (LASIX ) 40 mg, Oral, Daily   Multiple Vitamin (MULTIVITAMIN WITH MINERALS) TABS tablet 1 tablet, Every morning   potassium chloride  SA (KLOR-CON  M) 20 MEQ tablet 20 mEq, Oral, Daily, Please keep scheduled appointment for future refills. Thank you.   rosuvastatin  (CRESTOR ) 40 mg, Oral, Daily at bedtime    vitamin A 10,000 Units, Daily   vitamin C 1,000 mg, Daily   vitamin E 400 Units, Daily   zinc gluconate 50 mg, Daily       Objective:   Physical Exam BP 108/86   Pulse (!) 52   Temp 97.8 F (36.6 C) (Oral)   Resp 16   Ht 5' 6.5 (1.689 m)   Wt 232 lb 6 oz (105.4 kg)   LMP 11/05/1995   SpO2 97%   BMI 36.94 kg/m  General: Well developed, NAD, BMI noted Neck: No  thyromegaly.  Slightly increased JVD at 45 degrees HEENT:  Normocephalic . Face symmetric, atraumatic Lungs:  CTA B Normal respiratory effort, no intercostal retractions, no accessory muscle use. Heart: RRR,  no murmur.  Abdomen:  Not distended, soft, non-tender. No rebound or rigidity.   Lower extremities: +/+++ pretibial and pedal edema bilaterally  Skin: Exposed areas without rash. Not pale. Not jaundice Neurologic:  alert & oriented X3.  Speech normal, gait appropriate for age and unassisted Strength symmetric and appropriate for age.  Psych: Cognition and judgment appear intact.  Cooperative with normal attention span and concentration.  Behavior appropriate. No anxious or depressed appearing.     Assessment     ASSESSMENT CV: ---A- Fibrillation,Dx 07-2021, cardioverted 10/11/2021, 08/23/2022. ---CHF Dx 07-2021 ---Carotid bruit w/ negative Dopplers Gout Thyroid  disease,  -RAI 2004; Bx 2005 neg;US  10-2014-- new nodule, Bx unsuccessful 12-2014;US  May 2023 nodules stable Morbid obesity  H/o  VIN I,  2000 Vitamin D  deficiency Acute anemia due to H. pylori + gastric ulcer 10/2021    Assessment & Plan Here for CPX -Td 11-2016 - pnm 23: 2016; prevnar 11-2016; PNM 20: 2023 - s/p shingrix  x 2 per pt  -  Flu shot today -Vaccines I recommend:COVID-vaccine (plans to proceed )  , RSV. -CCS: Had a colonoscopy 10-2014, negative, C-scope 12/2021, next per GI.  Female care:  ---per [facility] PAP 12-07-2018, history of abnormal Pap in the past, requests  to establish with a new GYN, referral sent. ---MMG  01/20/2024 ---Several DEXAs before, last  was  03/06/2015 @  [facility] gynecology.   DEXA 04/21/2024: WNL -Labs:  CMP CBC A1c TSH BNP vitamin D   Other issues: Congestive heart failure with exacerbation. Atrial fibrillation, AV block,    Saw cardiology April 2025, noted to have a  low atrial fibrilation burden, not on rate control drugs due to h/o second-degree AV block Mobitz type I.  Anticoagulated.  For now they are delaying the pacemaker in [place] d/t AV block. At this point, she has some peripheral edema and question of increased JVD indicating mild CHF exacerbation. Oxygen levels stable. - Order CMP, BNP, and CBC. - Further advised for results, consider additional doses of Lasix  as needed Hyperlipidemia Well-controlled on rosuvastatin  and Zetia   LDL 89, cardiology rec a LDL of less than 100. Nail care: Unable to provide self nail care.  Referred to podiatry RTC 3 months   "

## 2024-08-17 NOTE — Patient Instructions (Addendum)
 Ms. Weightman,  Thank you for taking the time for your Medicare Wellness Visit. I appreciate your continued commitment to your health goals. Please review the care plan we discussed, and feel free to reach out if I can assist you further.  Medicare recommends these wellness visits once per year to help you and your care team stay ahead of potential health issues. These visits are designed to focus on prevention, allowing your provider to concentrate on managing your acute and chronic conditions during your regular appointments.  Please note that Annual Wellness Visits do not include a physical exam. Some assessments may be limited, especially if the visit was conducted virtually. If needed, we may recommend a separate in-person follow-up with your provider.  Ongoing Care Seeing your primary care provider every 3 to 6 months helps us  monitor your health and provide consistent, personalized care.   Referrals If a referral was made during today's visit and you haven't received any updates within two weeks, please contact the referred provider directly to check on the status.  Recommended Screenings:  Health Maintenance  Topic Date Due   Zoster (Shingles) Vaccine (2 of 2) 12/10/2022   COVID-19 Vaccine (4 - 2025-26 season) 07/05/2024   Medicare Annual Wellness Visit  08/17/2025   Breast Cancer Screening  01/19/2026   DTaP/Tdap/Td vaccine (3 - Tdap) 12/03/2026   Colon Cancer Screening  12/26/2031   Pneumococcal Vaccine for age over 26  Completed   Flu Shot  Completed   DEXA scan (bone density measurement)  Completed   Hepatitis C Screening  Completed   Meningitis B Vaccine  Aged Out       08/17/2024   10:21 AM  Advanced Directives  Does Patient Have a Medical Advance Directive? No  Would patient like information on creating a medical advance directive? No - Patient declined   Advance Care Planning is important because it: Ensures you receive medical care that aligns with your values,  goals, and preferences. Provides guidance to your family and loved ones, reducing the emotional burden of decision-making during critical moments.  Vision: Annual vision screenings are recommended for early detection of glaucoma, cataracts, and diabetic retinopathy. These exams can also reveal signs of chronic conditions such as diabetes and high blood pressure.  Dental: Annual dental screenings help detect early signs of oral cancer, gum disease, and other conditions linked to overall health, including heart disease and diabetes.  Please see the attached documents for additional preventive care recommendations.

## 2024-08-17 NOTE — Progress Notes (Signed)
 Subjective:   Brittney Walker is a 73 y.o. who presents for a Medicare Wellness preventive visit.  As a reminder, Annual Wellness Visits don't include a physical exam, and some assessments may be limited, especially if this visit is performed virtually. We may recommend an in-person follow-up visit with your provider if needed.  Visit Complete: Virtual I connected with  Brittney Walker on 08/17/24 by a audio enabled telemedicine application and verified that I am speaking with the correct person using two identifiers.  Patient Location: Home  Provider Location: Home Office  I discussed the limitations of evaluation and management by telemedicine. The patient expressed understanding and agreed to proceed.  Vital Signs: Because this visit was a virtual/telehealth visit, some criteria may be missing or patient reported. Any vitals not documented were not able to be obtained and vitals that have been documented are patient reported.    Persons Participating in Visit: Patient.  AWV Questionnaire: No: Patient Medicare AWV questionnaire was not completed prior to this visit.  Cardiac Risk Factors include: advanced age (>17men, >85 women)     Objective:    Today's Vitals   08/17/24 1013  Weight: 232 lb (105.2 kg)  Height: 5' 6.5 (1.689 m)   Body mass index is 36.88 kg/m.     08/17/2024   10:21 AM 08/13/2023    8:59 AM 06/18/2023   10:31 PM 04/23/2023    9:00 AM 08/23/2022    9:09 AM 07/31/2022   10:36 AM 12/25/2021    7:52 AM  Advanced Directives  Does Patient Have a Medical Advance Directive? No No No No No No No  Would patient like information on creating a medical advance directive? No - Patient declined No - Patient declined No - Patient declined Yes (MAU/Ambulatory/Procedural Areas - Information given)  No - Patient declined No - Patient declined    Current Medications (verified) Outpatient Encounter Medications as of 08/17/2024  Medication Sig   apixaban  (ELIQUIS ) 5  MG TABS tablet Take 1 tablet (5 mg total) by mouth 2 (two) times daily.   Ascorbic Acid (VITAMIN C) 1000 MG tablet Take 1,000 mg by mouth daily.   calcium  carbonate (OS-CAL - DOSED IN MG OF ELEMENTAL CALCIUM ) 1250 MG tablet Take 1,250 mg by mouth daily with breakfast.   cholecalciferol (VITAMIN D ) 1000 UNITS tablet Take 1,000 Units by mouth daily.   ezetimibe  (ZETIA ) 10 MG tablet Take 1 tablet (10 mg total) by mouth daily.   furosemide  (LASIX ) 20 MG tablet As needed patient may take 40 MG Lasix  PRN by mouth QD x 4 days as directed per Alleviate Research HF Study. PLEASE DO NOT REMOVE PRESCRIPTION PRN ONLY IF DIRECTED   furosemide  (LASIX ) 40 MG tablet TAKE 1 TABLET EVERY DAY   Multiple Vitamin (MULTIVITAMIN WITH MINERALS) TABS tablet Take 1 tablet by mouth in the morning. Alive   potassium chloride  SA (KLOR-CON  M) 20 MEQ tablet Take 1 tablet (20 mEq total) by mouth daily. Please keep scheduled appointment for future refills. Thank you.   rosuvastatin  (CRESTOR ) 40 MG tablet Take 1 tablet (40 mg total) by mouth at bedtime.   vitamin A 10000 UNIT capsule Take 10,000 Units by mouth daily.   vitamin B-12 (CYANOCOBALAMIN ) 1000 MCG tablet Take 1,000 mcg by mouth daily.   vitamin E 400 UNIT capsule Take 400 Units by mouth daily.   zinc gluconate 50 MG tablet Take 50 mg by mouth daily.   [DISCONTINUED] apixaban  (ELIQUIS ) 5 MG TABS tablet Take 1  tablet (5 mg total) by mouth 2 (two) times daily.   [DISCONTINUED] apixaban  (ELIQUIS ) 5 MG TABS tablet Take 1 tablet (5 mg total) by mouth 2 (two) times daily.   [DISCONTINUED] potassium chloride  (KLOR-CON ) 10 MEQ tablet Patient may take additional 20 mEq potassium BY MOUTH DAILY AS DIRECTED for Alleviate HF Research Study. PLEASE DO NOT REMOVE PRESCRIPTION PRN ONLY IF DIRECTED BY STUDY TEAM (Patient not taking: Reported on 08/17/2024)   No facility-administered encounter medications on file as of 08/17/2024.    Allergies (verified) Patient has no known  allergies.   History: Past Medical History:  Diagnosis Date   Atrial fibrillation (HCC) 10/04/2021   Carotid bruit    w/ neg dopplers    CHF (congestive heart failure) (HCC)    History of gastric ulcer 10/04/2021   Hyperthyroidism     s/p RAI 2004   Normocytic anemia    Persistent atrial fibrillation (HCC)    a. Dx 07/2021; b. CHA2DS2VASc = 3-->Eliquis ; c. 10/2021 s/p DCCV.   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   Vitamin D  deficiency    Past Surgical History:  Procedure Laterality Date   ABDOMINAL HYSTERECTOMY  11/05/1995   TAH-LEIOMYOMATA-- no BSO   BIOPSY  12/25/2021   Procedure: BIOPSY;  Surgeon: Legrand Victory LITTIE DOUGLAS, MD;  Location: [facility];  Service: Gastroenterology;;   BIOPSY THYROID       bx of largest nodule 2005, benign   BREAST SURGERY     x's 2--fibrocystic dz   CARDIOVERSION N/A 10/10/2021   Procedure: CARDIOVERSION;  Surgeon: Francyne Headland, MD;  Location: [facility] ENDOSCOPY;  Service: Cardiovascular;  Laterality: N/A;   CARDIOVERSION N/A 08/23/2022   Procedure: CARDIOVERSION;  Surgeon: Loni Soyla LABOR, MD;  Location: [facility] ENDOSCOPY;  Service: Cardiovascular;  Laterality: N/A;   CATARACT EXTRACTION, BILATERAL Bilateral    COLONOSCOPY WITH PROPOFOL  N/A 12/25/2021   Procedure: COLONOSCOPY WITH PROPOFOL ;  Surgeon: Legrand Victory LITTIE DOUGLAS, MD;  Location: [facility];  Service: Gastroenterology;  Laterality: N/A;   ESOPHAGOGASTRODUODENOSCOPY (EGD) WITH PROPOFOL  N/A 10/12/2021   Procedure: ESOPHAGOGASTRODUODENOSCOPY (EGD) WITH PROPOFOL ;  Surgeon: Legrand Victory LITTIE DOUGLAS, MD;  Location: [facility] ENDOSCOPY;  Service: Gastroenterology;  Laterality: N/A;   ESOPHAGOGASTRODUODENOSCOPY (EGD) WITH PROPOFOL  N/A 12/25/2021   Procedure: ESOPHAGOGASTRODUODENOSCOPY (EGD) WITH PROPOFOL ;  Surgeon: Legrand Victory LITTIE DOUGLAS, MD;  Location: [facility];  Service: Gastroenterology;  Laterality: N/A;   POLYPECTOMY  12/25/2021   Procedure: POLYPECTOMY;  Surgeon: Legrand Victory LITTIE DOUGLAS, MD;   Location: [facility];  Service: Gastroenterology;;   TONSILLECTOMY     Family History  Problem Relation Age of Onset   Seizures Father        Epilepsy   Hypertension Father    Heart attack Father        ?   Breast cancer Sister 53   Heart disease Mother    Hypertension Mother    Colon cancer Neg Hx    Pancreatic cancer Neg Hx    Esophageal cancer Neg Hx    Prostate cancer Neg Hx    Rectal cancer Neg Hx    Social History   Socioeconomic History   Marital status: Single    Spouse name: Not on file   Number of children: 0   Years of education: Not on file   Highest education level: Not on file  Occupational History   Occupation: retired---collections  Tobacco Use   Smoking status: Never   Smokeless tobacco: Never   Tobacco comments:    Never smoke  08/15/22  Vaping Use   Vaping status: Never Used  Substance and Sexual Activity   Alcohol use: No    Alcohol/week: 0.0 standard drinks of alcohol   Drug use: No   Sexual activity: Never    Comment: [place]  Other Topics Concern   Not on file  Social History Narrative   Lives by herself    Social Drivers of Health   Financial Resource Strain: Low Risk  (08/17/2024)   Overall Financial Resource Strain (CARDIA)    Difficulty of Paying Living Expenses: Not hard at all  Food Insecurity: No Food Insecurity (08/17/2024)   Hunger Vital Sign    Worried About Running Out of Food in the Last Year: Never true    Ran Out of Food in the Last Year: Never true  Transportation Needs: No Transportation Needs (08/17/2024)   PRAPARE - Administrator, Civil Service (Medical): No    Lack of Transportation (Non-Medical): No  Physical Activity: Inactive (08/17/2024)   Exercise Vital Sign    Days of Exercise per Week: 0 days    Minutes of Exercise per Session: 0 min  Stress: No Stress Concern Present (08/17/2024)   [employer] of Occupational Health - Occupational Stress Questionnaire    Feeling of Stress: Not at all   Social Connections: Moderately Integrated (08/17/2024)   Social Connection and Isolation Panel    Frequency of Communication with Friends and Family: More than three times a week    Frequency of Social Gatherings with Friends and Family: More than three times a week    Attends Religious Services: More than 4 times per year    Active Member of [employer] or Organizations: Yes    Attends Engineer, structural: More than 4 times per year    Marital Status: Never married    Tobacco Counseling Counseling given: Not Answered Tobacco comments: Never smoke 08/15/22    Clinical Intake:  Pre-visit preparation completed: Yes  Pain : No/denies pain     BMI - recorded: 36.88 Nutritional Status: BMI > 30  Obese Nutritional Risks: None Diabetes: No  Lab Results  Component Value Date   HGBA1C 5.6 12/21/2021     How often do you need to have someone help you when you read instructions, pamphlets, or other written materials from your doctor or pharmacy?: 1 - Never  Interpreter Needed?: No  Information entered by :: Rojelio Blush LPN   Activities of Daily Living     08/17/2024   10:20 AM 08/17/2024    9:22 AM  In your present state of health, do you have any difficulty performing the following activities:  Hearing? 0 0  Vision? 0 0  Difficulty concentrating or making decisions? 0 0  Walking or climbing stairs? 1 0  Comment Uses a Arts development officer or bathing? 0 0  Doing errands, shopping? 0 0  Preparing Food and eating ? N   Using the Toilet? N   In the past six months, have you accidently leaked urine? N   Do you have problems with loss of bowel control? N   Managing your Medications? N   Managing your Finances? N   Housekeeping or managing your Housekeeping? N     Patient Care Team: Amon Aloysius BRAVO, MD as PCP - General (Internal Medicine) Francyne Headland, MD as PCP - Cardiology (Cardiology) Mealor, Eulas BRAVO, MD as PCP - Electrophysiology (Cardiology)  I  have updated your Care Teams any recent Medical Services  you may have received from other providers in the past year.     Assessment:   This is a routine wellness examination for Patrici.  Hearing/Vision screen Hearing Screening - Comments:: Denies hearing difficulties   Vision Screening - Comments:: Wears rx glasses - up to date with routine eye exams with  [facility]   Goals Addressed               This Visit's Progress     Increase physical activity (pt-stated)        Get more active       Depression Screen     08/17/2024    9:52 AM 10/01/2023    2:50 PM 08/13/2023    9:03 AM 03/10/2023   11:05 AM 11/22/2022    8:26 AM 07/31/2022    9:15 AM 12/21/2021   10:10 AM  PHQ 2/9 Scores  PHQ - 2 Score 0 0 0 0 0 0 0  PHQ- 9 Score 3          Fall Risk     08/17/2024   10:20 AM 10/01/2023    2:50 PM 08/13/2023    8:59 AM 03/10/2023   11:05 AM 11/22/2022    8:26 AM  Fall Risk   Falls in the past year? 0 0 0 0 0  Number falls in past yr: 0 0 0 0 0  Injury with Fall? 0 0 0 0 0  Risk for fall due to : No Fall Risks  No Fall Risks    Follow up Falls evaluation completed Falls evaluation completed Falls evaluation completed Falls evaluation completed Falls evaluation completed      Data saved with a previous flowsheet row definition    MEDICARE RISK AT HOME:  Medicare Risk at Home Any stairs in or around the home?: No If so, are there any without handrails?: No Home free of loose throw rugs in walkways, pet beds, electrical cords, etc?: Yes Adequate lighting in your home to reduce risk of falls?: Yes Life alert?: No Use of a cane, walker or w/c?: Yes Grab bars in the bathroom?: No Shower chair or bench in shower?: No Elevated toilet seat or a handicapped toilet?: Yes  TIMED UP AND GO:  Was the test performed?  No  Cognitive Function: 6CIT completed        08/17/2024   10:21 AM 08/13/2023    9:08 AM 07/31/2022   10:44 AM  6CIT Screen  What Year? 0 points 0  points 0 points  What month? 0 points 0 points 0 points  What time? 0 points 0 points 0 points  Count back from 20 0 points 0 points 0 points  Months in reverse 0 points 0 points 0 points  Repeat phrase 0 points 0 points 0 points  Total Score 0 points 0 points 0 points    Immunizations Immunization History  Administered Date(s) Administered   Fluad Quad(high Dose 65+) 10/11/2021, 07/31/2022   Fluad Trivalent(High Dose 65+) 08/13/2023   INFLUENZA, HIGH DOSE SEASONAL PF 12/09/2017, 08/17/2024   Influenza,inj,Quad PF,6+ Mos 10/14/2014, 10/12/2015, 12/03/2016   Janssen (J&J) SARS-COV-2 Vaccination 01/16/2020   PFIZER(Purple Top)SARS-COV-2 Vaccination 09/30/2020   PNEUMOCOCCAL CONJUGATE-20 12/21/2021   Pfizer(Comirnaty)Fall Seasonal Vaccine 12 years and older 08/23/2023   Pneumococcal Conjugate-13 12/03/2016   Pneumococcal Polysaccharide-23 10/12/2015   Td 06/27/2005, 12/03/2016   Zoster Recombinant(Shingrix ) 10/15/2022    Screening Tests Health Maintenance  Topic Date Due   Zoster Vaccines- Shingrix  (2 of 2) 12/10/2022  COVID-19 Vaccine (4 - 2025-26 season) 07/05/2024   Medicare Annual Wellness (AWV)  08/17/2025   Mammogram  01/19/2026   DTaP/Tdap/Td (3 - Tdap) 12/03/2026   Colonoscopy  12/26/2031   Pneumococcal Vaccine: 50+ Years  Completed   Influenza Vaccine  Completed   DEXA SCAN  Completed   Hepatitis C Screening  Completed   Meningococcal B Vaccine  Aged Out    Health Maintenance Items Addressed:   Additional Screening:  Vision Screening: Recommended annual ophthalmology exams for early detection of glaucoma and other disorders of the eye. Is the patient up to date with their annual eye exam?  Yes  Who is the provider or what is the name of the office in which the patient attends annual eye exams? [facility]  Dental Screening: Recommended annual dental exams for proper oral hygiene  Community Resource Referral / Chronic Care Management: CRR required  this visit?  No   CCM required this visit?  No   Plan:    I have personally reviewed and noted the following in the patient's chart:   Medical and social history Use of alcohol, tobacco or illicit drugs  Current medications and supplements including opioid prescriptions. Patient is not currently taking opioid prescriptions. Functional ability and status Nutritional status Physical activity Advanced directives List of other physicians Hospitalizations, surgeries, and ER visits in previous 12 months Vitals Screenings to include cognitive, depression, and falls Referrals and appointments  In addition, I have reviewed and discussed with patient certain preventive protocols, quality metrics, and best practice recommendations. A written personalized care plan for preventive services as well as general preventive health recommendations were provided to patient.   Rojelio LELON Blush, LPN   [id number]   After Visit Summary: (MyChart) Due to this being a telephonic visit, the after visit summary with patients personalized plan was offered to patient via MyChart   Notes: Nothing significant to report at this time.

## 2024-08-17 NOTE — Assessment & Plan Note (Signed)
 Here for CPX -Td 11-2016 - pnm 23: 2016; prevnar 11-2016; PNM 20: 2023 - s/p shingrix  x 2 per pt  -  Flu shot today -Vaccines I recommend:COVID-vaccine (plans to proceed )  , RSV. -CCS: Had a colonoscopy 10-2014, negative, C-scope 12/2021, next per GI.  Female care:  ---per [facility] PAP 12-07-2018, history of abnormal Pap in the past, requests  to establish with a new GYN, referral sent. ---MMG 01/20/2024 ---Several DEXAs before, last  was  03/06/2015 @  [facility] gynecology.   DEXA 04/21/2024: WNL -Labs:  CMP CBC A1c TSH BNP vitamin D 

## 2024-08-18 ENCOUNTER — Ambulatory Visit: Payer: Self-pay | Admitting: Internal Medicine

## 2024-08-21 ENCOUNTER — Ambulatory Visit (INDEPENDENT_AMBULATORY_CARE_PROVIDER_SITE_OTHER)

## 2024-08-21 DIAGNOSIS — I4819 Other persistent atrial fibrillation: Secondary | ICD-10-CM | POA: Diagnosis not present

## 2024-08-24 LAB — CUP PACEART REMOTE DEVICE CHECK
Date Time Interrogation Session: 20251017230315
Implantable Pulse Generator Implant Date: 20221208

## 2024-08-25 ENCOUNTER — Ambulatory Visit: Payer: Self-pay | Admitting: Cardiovascular Disease

## 2024-08-26 ENCOUNTER — Ambulatory Visit: Admitting: Podiatry

## 2024-08-27 NOTE — Progress Notes (Signed)
 Remote Loop Recorder Transmission

## 2024-08-28 ENCOUNTER — Encounter

## 2024-08-31 ENCOUNTER — Ambulatory Visit: Admitting: Podiatry

## 2024-09-01 ENCOUNTER — Other Ambulatory Visit: Payer: Self-pay | Admitting: Cardiovascular Disease

## 2024-09-01 DIAGNOSIS — I4819 Other persistent atrial fibrillation: Secondary | ICD-10-CM

## 2024-09-01 NOTE — Telephone Encounter (Signed)
 Eliquis  5mg  refill request received. Patient is 74 years old, weight-105.2kg, Crea-0.72 on 08/17/24, Diagnosis-Afib, and last seen by Dr. Francyne on 02/04/24. Dose is appropriate based on dosing criteria. Will send in refill to requested pharmacy.

## 2024-09-07 ENCOUNTER — Ambulatory Visit: Admitting: Podiatry

## 2024-09-09 NOTE — Progress Notes (Unsigned)
 Electrophysiology Office Note:   Date:  09/10/2024  ID:  BRIDNEY GUADARRAMA, DOB 05-Dec-1950, MRN [id number]  Primary Cardiologist: Jerel Balding, MD Primary Heart Failure: None Electrophysiologist: Eulas FORBES Furbish, MD      History of Present Illness:   Brittney Walker is a 73 y.o. female with h/o AF, thyroid  disease s/p radioiodide ablation seen today for routine electrophysiology followup.   Since last being seen in our clinic the patient reports doing well overall. She has multiple concerns that are related to her recent move > she reports she was living in a condo that had mold in it. She worries she needs to have her lungs checked. She is also concerned about her iron levels. She states when she moved, they lost her monitor for her loop.  She states she thinks she is swollen in her feet.     She denies chest pain, palpitations, dyspnea, PND, orthopnea, nausea, vomiting, dizziness, syncope, weight gain, or early satiety.   Review of systems complete and found to be negative unless listed in HPI.   EP Information / Studies Reviewed:    EKG is not ordered today. EKG from 02/04/24 reviewed which showed SB with 2nd Degree AVB Mobitz I        Arrhythmia / AAD / Pertinent EP Studies AF > initial dx ~ 07/2021 DCCV 10/2021   Device  [facility] ILR implanted 10/11/21 for AF, HF   Risk Assessment/Calculations:    CHA2DS2-VASc Score = 4   This indicates a 4.8% annual risk of stroke. The patient's score is based upon: CHF History: 1 HTN History: 0 Diabetes History: 0 Stroke History: 0 Vascular Disease History: 1 Age Score: 1 Gender Score: 1        STOP-Bang Score:          Physical Exam:   VS:  BP (!) 120/54   Pulse (!) 48   Ht 5' 6 (1.676 m)   Wt 234 lb (106.1 kg)   LMP 11/05/1995   SpO2 97%   BMI 37.77 kg/m    Wt Readings from Last 3 Encounters:  09/10/24 234 lb (106.1 kg)  08/17/24 232 lb (105.2 kg)  08/17/24 232 lb 6 oz (105.4 kg)     GEN: Well nourished, well  developed in no acute distress NECK: No JVD; No carotid bruits CARDIAC: Regular rate and rhythm, no murmurs, rubs, gallops RESPIRATORY:  Clear to auscultation without rales, wheezing or rhonchi  ABDOMEN: Soft, non-tender, non-distended EXTREMITIES:  trace pedal edema; No deformity   ASSESSMENT AND PLAN:    Persistent Atrial Fibrillation  CHA2DS2-VASc 4 -OAC for stroke prophylaxis  -ILR review shows > from 4/15-9/27/25 2.8% AF burden, monitor has been disconnected > reviewed with patient and she has contacted [facility] to get a new monitor  -discussed importance of no missed doses of OAC -reviewed rhythm control with her > if her burden increases could consider AAD  Secondary Hypercoagulable State  -continue Eliquis  5mg  BID, dose reviewed and appropriate by age / wt  Mobitz I Second Degree AVB  -asymptomatic    -pause episodes > brief, largely 3-4 seconds in setting of AF. One 5 read as 5 sec in setting of AF as well.  -avoid AV nodal blockers  -may require PPM at some point   HFpEF  -follows with Dr. Balding -pt has trace edema in feet, none in legs -discussed compressions stockings   Encouraged her to follow up with PCP for concerns regarding mold and pulmonary evaluation.  Follow up with Dr. Nancey in 12 months or sooner if new symptoms arise  Signed, Daphne Barrack, NP-C, AGACNP-BC [facility] - Electrophysiology  09/10/2024, 9:43 AM

## 2024-09-10 ENCOUNTER — Ambulatory Visit: Attending: Pulmonary Disease | Admitting: Pulmonary Disease

## 2024-09-10 ENCOUNTER — Encounter: Payer: Self-pay | Admitting: Pulmonary Disease

## 2024-09-10 VITALS — BP 120/54 | HR 48 | Ht 66.0 in | Wt 234.0 lb

## 2024-09-10 DIAGNOSIS — Z95818 Presence of other cardiac implants and grafts: Secondary | ICD-10-CM

## 2024-09-10 DIAGNOSIS — I4819 Other persistent atrial fibrillation: Secondary | ICD-10-CM

## 2024-09-10 DIAGNOSIS — I441 Atrioventricular block, second degree: Secondary | ICD-10-CM

## 2024-09-10 DIAGNOSIS — I50812 Chronic right heart failure: Secondary | ICD-10-CM

## 2024-09-10 DIAGNOSIS — D6869 Other thrombophilia: Secondary | ICD-10-CM

## 2024-09-10 NOTE — Patient Instructions (Signed)
 Medication Instructions:   Your physician recommends that you continue on your current medications as directed. Please refer to the Current Medication list given to you today.  *If you need a refill on your cardiac medications before your next appointment, please call your pharmacy*  Lab Work: NONE ORDERED  TODAY   If you have labs (blood work) drawn today and your tests are completely normal, you will receive your results only by: MyChart Message (if you have MyChart) OR A paper copy in the mail If you have any lab test that is abnormal or we need to change your treatment, we will call you to review the results.  Testing/Procedures: NONE ORDERED  TODAY    Follow-Up: At [facility], you and your health needs are our priority.  As part of our continuing mission to provide you with exceptional heart care, our providers are all part of one team.  This team includes your primary Cardiologist (physician) and Advanced Practice Providers or APPs (Physician Assistants and Nurse Practitioners) who all work together to provide you with the care you need, when you need it.  Your next appointment:    1 year(s)  Provider:    You may see Eulas FORBES Furbish, MD or one of the following Advanced Practice Providers on your designated Care Team:   Daphne Barrack, NP  We recommend signing up for the patient portal called MyChart.  Sign up information is provided on this After Visit Summary.  MyChart is used to connect with patients for Virtual Visits (Telemedicine).  Patients are able to view lab/test results, encounter notes, upcoming appointments, etc.  Non-urgent messages can be sent to your provider as well.   To learn more about what you can do with MyChart, go to [email or web address].   Other Instructions

## 2024-09-14 ENCOUNTER — Ambulatory Visit: Admitting: Podiatry

## 2024-09-21 ENCOUNTER — Ambulatory Visit (INDEPENDENT_AMBULATORY_CARE_PROVIDER_SITE_OTHER)

## 2024-09-21 DIAGNOSIS — I4819 Other persistent atrial fibrillation: Secondary | ICD-10-CM

## 2024-09-22 LAB — CUP PACEART REMOTE DEVICE CHECK
Date Time Interrogation Session: 20251117231239
Implantable Pulse Generator Implant Date: 20221208

## 2024-09-23 ENCOUNTER — Other Ambulatory Visit: Payer: Self-pay

## 2024-09-23 ENCOUNTER — Emergency Department (HOSPITAL_COMMUNITY)

## 2024-09-23 ENCOUNTER — Ambulatory Visit: Admitting: Podiatry

## 2024-09-23 ENCOUNTER — Observation Stay (HOSPITAL_COMMUNITY)
Admission: EM | Admit: 2024-09-23 | Discharge: 2024-09-27 | Disposition: A | Discharge status: Home / Self Care | Attending: Internal Medicine | Admitting: Internal Medicine

## 2024-09-23 DIAGNOSIS — R2681 Unsteadiness on feet: Secondary | ICD-10-CM | POA: Diagnosis not present

## 2024-09-23 DIAGNOSIS — Z006 Encounter for examination for normal comparison and control in clinical research program: Secondary | ICD-10-CM

## 2024-09-23 DIAGNOSIS — E782 Mixed hyperlipidemia: Secondary | ICD-10-CM | POA: Diagnosis not present

## 2024-09-23 DIAGNOSIS — J209 Acute bronchitis, unspecified: Secondary | ICD-10-CM | POA: Diagnosis not present

## 2024-09-23 DIAGNOSIS — M542 Cervicalgia: Secondary | ICD-10-CM | POA: Diagnosis not present

## 2024-09-23 DIAGNOSIS — K2289 Other specified disease of esophagus: Secondary | ICD-10-CM | POA: Diagnosis not present

## 2024-09-23 DIAGNOSIS — I272 Pulmonary hypertension, unspecified: Secondary | ICD-10-CM | POA: Diagnosis present

## 2024-09-23 DIAGNOSIS — J40 Bronchitis, not specified as acute or chronic: Secondary | ICD-10-CM | POA: Diagnosis present

## 2024-09-23 DIAGNOSIS — M7671 Peroneal tendinitis, right leg: Secondary | ICD-10-CM | POA: Diagnosis not present

## 2024-09-23 DIAGNOSIS — I441 Atrioventricular block, second degree: Secondary | ICD-10-CM | POA: Diagnosis not present

## 2024-09-23 DIAGNOSIS — K229 Disease of esophagus, unspecified: Secondary | ICD-10-CM | POA: Diagnosis present

## 2024-09-23 DIAGNOSIS — D509 Iron deficiency anemia, unspecified: Secondary | ICD-10-CM | POA: Insufficient documentation

## 2024-09-23 DIAGNOSIS — I480 Paroxysmal atrial fibrillation: Secondary | ICD-10-CM | POA: Insufficient documentation

## 2024-09-23 DIAGNOSIS — I50811 Acute right heart failure: Principal | ICD-10-CM

## 2024-09-23 DIAGNOSIS — E039 Hypothyroidism, unspecified: Secondary | ICD-10-CM | POA: Diagnosis not present

## 2024-09-23 DIAGNOSIS — I5033 Acute on chronic diastolic (congestive) heart failure: Secondary | ICD-10-CM | POA: Diagnosis not present

## 2024-09-23 DIAGNOSIS — I50812 Chronic right heart failure: Secondary | ICD-10-CM

## 2024-09-23 DIAGNOSIS — R059 Cough, unspecified: Secondary | ICD-10-CM | POA: Diagnosis present

## 2024-09-23 DIAGNOSIS — E042 Nontoxic multinodular goiter: Secondary | ICD-10-CM | POA: Insufficient documentation

## 2024-09-23 LAB — I-STAT CHEM 8, ED
BUN: 21 mg/dL (ref 8–23)
Calcium, Ion: 1.17 mmol/L (ref 1.15–1.40)
Chloride: 102 mmol/L (ref 98–111)
Creatinine, Ser: 1 mg/dL (ref 0.44–1.00)
Glucose, Bld: 91 mg/dL (ref 70–99)
HCT: 35 % — ABNORMAL LOW (ref 36.0–46.0)
Hemoglobin: 11.9 g/dL — ABNORMAL LOW (ref 12.0–15.0)
Potassium: 4.2 mmol/L (ref 3.5–5.1)
Sodium: 141 mmol/L (ref 135–145)
TCO2: 27 mmol/L (ref 22–32)

## 2024-09-23 LAB — CBC WITH DIFFERENTIAL/PLATELET
Abs Immature Granulocytes: 0.04 K/uL (ref 0.00–0.07)
Basophils Absolute: 0 K/uL (ref 0.0–0.1)
Basophils Relative: 0 %
Eosinophils Absolute: 0.1 K/uL (ref 0.0–0.5)
Eosinophils Relative: 1 %
HCT: 36.6 % (ref 36.0–46.0)
Hemoglobin: 11.6 g/dL — ABNORMAL LOW (ref 12.0–15.0)
Immature Granulocytes: 0 %
Lymphocytes Relative: 13 %
Lymphs Abs: 1.3 K/uL (ref 0.7–4.0)
MCH: 29.7 pg (ref 26.0–34.0)
MCHC: 31.7 g/dL (ref 30.0–36.0)
MCV: 93.6 fL (ref 80.0–100.0)
Monocytes Absolute: 0.9 K/uL (ref 0.1–1.0)
Monocytes Relative: 9 %
Neutro Abs: 7.5 K/uL (ref 1.7–7.7)
Neutrophils Relative %: 77 %
Platelets: 171 K/uL (ref 150–400)
RBC: 3.91 MIL/uL (ref 3.87–5.11)
RDW: 13.6 % (ref 11.5–15.5)
Smear Review: NORMAL
WBC: 9.9 K/uL (ref 4.0–10.5)
nRBC: 0 % (ref 0.0–0.2)

## 2024-09-23 LAB — RESP PANEL BY RT-PCR (RSV, FLU A&B, COVID)  RVPGX2
Influenza A by PCR: NEGATIVE
Influenza B by PCR: NEGATIVE
Resp Syncytial Virus by PCR: NEGATIVE
SARS Coronavirus 2 by RT PCR: NEGATIVE

## 2024-09-23 LAB — PRO BRAIN NATRIURETIC PEPTIDE: Pro Brain Natriuretic Peptide: 1560 pg/mL — ABNORMAL HIGH (ref <300.0)

## 2024-09-23 LAB — TROPONIN T, HIGH SENSITIVITY: Troponin T High Sensitivity: 16 ng/L (ref 0–19)

## 2024-09-23 MED ORDER — IPRATROPIUM BROMIDE 0.02 % IN SOLN
0.5000 mg | Freq: Once | RESPIRATORY_TRACT | Status: AC
  Administered 2024-09-23: 0.5 mg via RESPIRATORY_TRACT
  Filled 2024-09-23: qty 2.5

## 2024-09-23 MED ORDER — FUROSEMIDE 10 MG/ML IJ SOLN
40.0000 mg | Freq: Once | INTRAMUSCULAR | Status: AC
  Administered 2024-09-24: 40 mg via INTRAVENOUS
  Filled 2024-09-23: qty 4

## 2024-09-23 MED ORDER — IOHEXOL 350 MG/ML SOLN
75.0000 mL | Freq: Once | INTRAVENOUS | Status: AC | PRN
  Administered 2024-09-23: 75 mL via INTRAVENOUS

## 2024-09-23 MED ORDER — ALBUTEROL SULFATE (2.5 MG/3ML) 0.083% IN NEBU
5.0000 mg | INHALATION_SOLUTION | Freq: Once | RESPIRATORY_TRACT | Status: AC
  Administered 2024-09-23: 5 mg via RESPIRATORY_TRACT
  Filled 2024-09-23: qty 6

## 2024-09-23 NOTE — Subjective & Objective (Signed)
 Patient was seen by podiatry today with severe foot pain she has known history of A-fib CHF hypothyroidism and anemia and had a steroid injection done today She came to emergency department later on complaining of leg swelling chronic cough shortness of breath wheezing for the past week Also had a laceration of the left hand with a knife which happened yesterday and bleeding hemorrhoids. Patient feels like her shortness of breath has been somewhat chronic but dyspnea on exertion and orthopnea getting worse States she is compliant with her Eliquis  Unable to lay flat No fevers no chills No chest pain No nausea no vomiting no diarrhea Supposed to be on Lasix  40 mg a day Chest x-ray nonacute COVID and flu test normal Noted to have elevated BNP CT angio done showed right-sided heart failure

## 2024-09-23 NOTE — Assessment & Plan Note (Signed)
 Vs right hear failure - Pt diagnosed with CHF based on presence of the following:  PND, OA,  and   bilateral leg edema, DOE,   With noted response to IV diuretic in ER  admit on telemetry,  cycle cardiac enzymes   obtain serial ECG  to evaluate for ischemia as a cause of heart failure  monitor daily weight:  Filed Weights   09/23/24 1951  Weight: 106.1 kg      diurese with IV lasix    40 mg IV Daily and monitor orthostatics and creatinine to avoid over diuresis.  Order echogram to evaluate EF and valves

## 2024-09-23 NOTE — Progress Notes (Signed)
 Subjective:  Patient ID: Brittney Walker, female    DOB: [birth year],  MRN: [id number]  Chief Complaint  Patient presents with   Nail Problem    73 y.o. female presents with the above complaint.  Patient presents with bilateral foot pain that has been off for quite some time is progressing and worse worse with ambulation and shoe pressure patient like to discuss treatment options for her.  She wants to get it evaluated pain scale 7 out of 10 dull aching nature.   Review of Systems: Negative except as noted in the HPI. Denies N/V/F/Ch.  Past Medical History:  Diagnosis Date   Atrial fibrillation (HCC) 10/04/2021   Carotid bruit    w/ neg dopplers    CHF (congestive heart failure) (HCC)    History of gastric ulcer 10/04/2021   Hyperthyroidism     s/p RAI 2004   Normocytic anemia    Persistent atrial fibrillation (HCC)    a. Dx 07/2021; b. CHA2DS2VASc = 3-->Eliquis ; c. 10/2021 s/p DCCV.   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   Vitamin D  deficiency     Current Outpatient Medications:    Ascorbic Acid (VITAMIN C) 1000 MG tablet, Take 1,000 mg by mouth daily., Disp: , Rfl:    calcium  carbonate (OS-CAL - DOSED IN MG OF ELEMENTAL CALCIUM ) 1250 MG tablet, Take 1,250 mg by mouth daily with breakfast., Disp: , Rfl:    cholecalciferol (VITAMIN D ) 1000 UNITS tablet, Take 1,000 Units by mouth daily., Disp: , Rfl:    ELIQUIS  5 MG TABS tablet, TAKE 1 TABLET BY MOUTH TWICE A DAY, Disp: 60 tablet, Rfl: 5   ezetimibe  (ZETIA ) 10 MG tablet, Take 1 tablet (10 mg total) by mouth daily., Disp: 90 tablet, Rfl: 3   furosemide  (LASIX ) 20 MG tablet, As needed patient may take 40 MG Lasix  PRN by mouth QD x 4 days as directed per [employer] HF Study. PLEASE DO NOT REMOVE PRESCRIPTION PRN ONLY IF DIRECTED, Disp: 90 tablet, Rfl: 3   furosemide  (LASIX ) 40 MG tablet, TAKE 1 TABLET EVERY DAY, Disp: 90 tablet, Rfl: 3   Multiple Vitamin (MULTIVITAMIN WITH MINERALS) TABS tablet, Take 1 tablet  by mouth in the morning. Alive, Disp: , Rfl:    potassium chloride  SA (KLOR-CON  M) 20 MEQ tablet, Take 1 tablet (20 mEq total) by mouth daily., Disp: 90 tablet, Rfl: 1   rosuvastatin  (CRESTOR ) 40 MG tablet, Take 1 tablet (40 mg total) by mouth at bedtime., Disp: 90 tablet, Rfl: 1   vitamin A 10000 UNIT capsule, Take 10,000 Units by mouth daily., Disp: , Rfl:    vitamin B-12 (CYANOCOBALAMIN ) 1000 MCG tablet, Take 1,000 mcg by mouth daily., Disp: , Rfl:    vitamin E 400 UNIT capsule, Take 400 Units by mouth daily., Disp: , Rfl:    zinc gluconate 50 MG tablet, Take 50 mg by mouth daily., Disp: , Rfl:   Social History   Tobacco Use  Smoking Status Never  Smokeless Tobacco Never  Tobacco Comments   Never smoke 08/15/22    No Known Allergies Objective:  There were no vitals filed for this visit. There is no height or weight on file to calculate BMI. Constitutional Well developed. Well nourished.  Vascular Dorsalis pedis pulses palpable bilaterally. Posterior tibial pulses palpable bilaterally. Capillary refill normal to all digits.  No cyanosis or clubbing noted. Pedal hair growth normal.  Neurologic Normal speech. Oriented to person, place, and time. Epicritic sensation to light touch grossly present  bilaterally.  Dermatologic Nails well groomed and normal in appearance. No open wounds. No skin lesions.  Orthopedic: Pain on palpation right lateral foot pain along the course of the peroneal tendon pain with resisted dorsiflexion eversion of the foot no pain with plantarflexion inversion of the foot.   Radiographs: None Assessment:   1. Peroneal tendinitis, right    Plan:  Patient was evaluated and treated and all questions answered.  Right peroneal tendinitis - All questions or concerns were discussed with the patient in extensive detail given the amount of pain that she is experiencing she will benefit from steroid injection help decrease acute inflammatory fibrosis with pain.   Patient agrees with plan like to proceed with steroid injection.  I discussed the risk of rupture associate with this she states understand like to proceed despite the risk -A steroid injection was performed at right lateral foot plantar maximal tenderness using 1% plain Lidocaine  and 10 mg of Kenalog . This was well tolerated.   No follow-ups on file.

## 2024-09-23 NOTE — ED Triage Notes (Signed)
 Pt arrived pov for multiple complaints, legs swelling, chronic cough mucous was yellow/brown, sob and sneezing for a week. Laceration on hand left hand with a knife yesterday Pt also wants to be seen for bleeding hemorrhoids.

## 2024-09-23 NOTE — Assessment & Plan Note (Signed)
-   Check TSH continue home medications

## 2024-09-23 NOTE — Assessment & Plan Note (Signed)
 Likely contributing to increased dyspnea,  Will need follow up with Cardiology

## 2024-09-23 NOTE — ED Provider Notes (Signed)
 [place] EMERGENCY DEPARTMENT AT [facility] Provider Note   CSN: [id number] Arrival date & time: 09/23/24  1927     Patient presents with: Cough   Brittney Walker is a 73 y.o. female.   73 year old with history of chronic shortness of breath presents with increased dyspnea exertion as well as orthopnea.  She does have a history of A-fib and is on Eliquis  and states that she is compliant.  Patient states that she frequently is unable to lie flat but that this has become worse.  Notes increasing cough without fever or chills.  States she has had bilateral lower extremity edema.  Denies any chest pain.  No vomiting or diarrhea.  Endorses a chronic cough.  Patient also states superficial laceration to the palmar surface of her left hand.  States that she has chronic hemorrhoids and that they are unchanged.  Her main issue for presenting today is due to her shortness of breath.       Prior to Admission medications   Medication Sig Start Date End Date Taking? Authorizing Provider  Ascorbic Acid (VITAMIN C) 1000 MG tablet Take 1,000 mg by mouth daily.   Yes [provider]  cholecalciferol (VITAMIN D ) 1000 UNITS tablet Take 1,000 Units by mouth daily.   Yes [provider]  ELIQUIS  5 MG TABS tablet TAKE 1 TABLET BY MOUTH TWICE A DAY 09/01/24  Yes Croitoru, Mihai, MD  ezetimibe  (ZETIA ) 10 MG tablet Take 1 tablet (10 mg total) by mouth daily. 02/10/24  Yes Croitoru, Mihai, MD  furosemide  (LASIX ) 20 MG tablet As needed patient may take 40 MG Lasix  PRN by mouth QD x 4 days as directed per [employer] HF Study. PLEASE DO NOT REMOVE PRESCRIPTION PRN ONLY IF DIRECTED 10/07/23 10/07/24 Yes Cindie Ole DASEN, MD  furosemide  (LASIX ) 40 MG tablet TAKE 1 TABLET EVERY DAY 03/15/24  Yes Croitoru, Mihai, MD  Multiple Vitamin (MULTIVITAMIN WITH MINERALS) TABS tablet Take 1 tablet by mouth in the morning. Alive   Yes [provider]  potassium chloride  SA  (KLOR-CON  M) 20 MEQ tablet Take 1 tablet (20 mEq total) by mouth daily. 09/02/24  Yes Croitoru, Mihai, MD  rosuvastatin  (CRESTOR ) 40 MG tablet Take 1 tablet (40 mg total) by mouth at bedtime. 03/30/24  Yes Paz, Jose E, MD  vitamin A 10000 UNIT capsule Take 10,000 Units by mouth daily.   Yes [provider]  vitamin B-12 (CYANOCOBALAMIN ) 1000 MCG tablet Take 1,000 mcg by mouth daily.   Yes [provider]  zinc gluconate 50 MG tablet Take 50 mg by mouth daily.   Yes [provider]  calcium  carbonate (OS-CAL - DOSED IN MG OF ELEMENTAL CALCIUM ) 1250 MG tablet Take 1,250 mg by mouth daily with breakfast.    [provider]  vitamin E 400 UNIT capsule Take 400 Units by mouth daily.    [provider]    Allergies: Patient has no known allergies.    Review of Systems  All other systems reviewed and are negative.   Updated Vital Signs BP (!) 153/57 (BP Location: Left Arm)   Pulse 60   Temp 98 F (36.7 C) (Oral)   Resp 18   Ht 1.676 m (5' 6)   Wt 106.1 kg   LMP 11/05/1995   SpO2 92%   BMI 37.77 kg/m   Physical Exam Vitals and nursing note reviewed.  Constitutional:      General: She is not in acute distress.  Appearance: Normal appearance. She is well-developed. She is not toxic-appearing.  HENT:     Head: Normocephalic and atraumatic.  Eyes:     General: Lids are normal.     Conjunctiva/sclera: Conjunctivae normal.     Pupils: Pupils are equal, round, and reactive to light.  Neck:     Thyroid : No thyroid  mass.     Trachea: No tracheal deviation.  Cardiovascular:     Rate and Rhythm: Normal rate and regular rhythm.     Heart sounds: Normal heart sounds. No murmur heard.    No gallop.  Pulmonary:     Effort: Pulmonary effort is normal. No respiratory distress.     Breath sounds: Normal breath sounds. No stridor. No decreased breath sounds, wheezing, rhonchi or rales.  Abdominal:     General: There is no distension.      Palpations: Abdomen is soft.     Tenderness: There is no abdominal tenderness. There is no rebound.  Musculoskeletal:        General: No tenderness. Normal range of motion.       Arms:     Cervical back: Normal range of motion and neck supple.  Skin:    General: Skin is warm and dry.     Findings: No abrasion or rash.  Neurological:     Mental Status: She is alert and oriented to person, place, and time. Mental status is at baseline.     GCS: GCS eye subscore is 4. GCS verbal subscore is 5. GCS motor subscore is 6.     Cranial Nerves: No cranial nerve deficit.     Sensory: No sensory deficit.     Motor: Motor function is intact.  Psychiatric:        Attention and Perception: Attention normal.        Speech: Speech normal.        Behavior: Behavior normal.     (all labs ordered are listed, but only abnormal results are displayed) Labs Reviewed  RESP PANEL BY RT-PCR (RSV, FLU A&B, COVID)  [id number]  CBC WITH DIFFERENTIAL/PLATELET  I-STAT CHEM 8, ED    EKG: None  Radiology: DG Chest 2 View Result Date: 09/23/2024 CLINICAL DATA:  Cough and shortness of breath EXAM: CHEST - 2 VIEW COMPARISON:  Chest x-ray 10/12/2011 FINDINGS: The heart is enlarged. Loop recorder device again seen. No pleural effusion or pneumothorax. Both lungs are clear. The visualized skeletal structures are unremarkable. IMPRESSION: 1. No active cardiopulmonary disease. 2. Cardiomegaly. Electronically Signed   By: Greig Pique M.D.   On: 09/23/2024 20:30     Procedures   Medications Ordered in the ED  albuterol  (PROVENTIL ) (2.5 MG/3ML) 0.083% nebulizer solution 5 mg (has no administration in time range)  ipratropium (ATROVENT) nebulizer solution 0.5 mg (has no administration in time range)                                    Medical Decision Making Amount and/or Complexity of Data Reviewed Labs: ordered. Radiology: ordered.  Risk Prescription drug management.   Patient had a chest x-ray which did  not show any severe acute findings.  Patient endorsed being short of breath.  Patient CBC and c-Met were reassuring.  COVID and flu test negative.  She had an elevated BNP.  She subsequently had a CT angio chest which demonstrated right-sided heart failure.  Patient has also had a infiltrative process surrounding  the esophagus and carotid vasculature.  Will need to get neck CT to evaluate for this.  Patient did endorse increased pedal edema.  Will give patient dose of Lasix  here and admit to the hospitalist team     Final diagnoses:  None    ED Discharge Orders     None          Dasie Faden, MD 09/23/24 2339

## 2024-09-23 NOTE — Progress Notes (Signed)
 Remote Loop Recorder Transmission

## 2024-09-23 NOTE — H&P (Signed)
 Brittney Walker FMW:[id number] DOB: 29-Sep-1951 DOA: 09/23/2024     PCP: Amon Aloysius BRAVO, MD   Outpatient Specialists:   CARDS: Dr. Jerel Balding, MD    Patient arrived to ER on 09/23/24 at 1927 Referred by Attending Dasie Faden, MD   Patient coming from:    home Lives alone,        Chief Complaint:   Chief Complaint  Patient presents with   Cough    HPI: Brittney Walker is a 73 y.o. 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 Patient feels like her shortness of breath has been somewhat chronic but dyspnea on exertion and orthopnea getting worse States she is compliant with her Eliquis  Unable to lay flat No fevers no chills No chest pain No nausea no vomiting no diarrhea Supposed to be on Lasix  40 mg a day Chest x-ray nonacute COVID and flu test normal Noted to have elevated BNP CT angio done showed right-sided heart failure    PT has hx of right heart failure  Patient reports she has been having cugh productive of yellow sputum for the past 2 weeks some chills but not fever, some wheezing,, she is never smoker,  Leg edema has been ongoing for the past few months But not getting better  No CP no syncope  Denies significant ETOH intake   Does not smoke      Regarding pertinent Chronic problems:     Hyperlipidemia -  on zetia , crestor  Lipid Panel     Component Value Date/Time   CHOL 169 06/04/2024 1545   TRIG 92  06/04/2024 1545   HDL 63 06/04/2024 1545   CHOLHDL 2.7 06/04/2024 1545   CHOLHDL 3.2 10/01/2023 1520   VLDL 20.0 05/26/2023 1110   LDLCALC 89 06/04/2024 1545   LDLCALC 138 (H) 10/01/2023 1520   LDLDIRECT 141.1 05/01/2009 0947   LABVLDL 17 06/04/2024 1545     HTN on lasix    last echo  Recent Results (from the past 56199 hours)  ECHOCARDIOGRAM COMPLETE   Collection Time: 03/04/23 11:45 AM  Result Value   Area-P 1/2 3.12   S' Lateral 3.10   Est EF 60 - 65%   Narrative      ECHOCARDIOGRAM REPORT       IMPRESSIONS    1. Left ventricular ejection fraction, by estimation, is 60 to 65%. The left ventricle has normal function. The left ventricle has no regional wall motion abnormalities. Left ventricular diastolic parameters are indeterminate.  2. Right ventricular systolic function is normal. The right ventricular size is normal. There is normal pulmonary artery systolic pressure. The estimated right ventricular systolic pressure is 22.9 mmHg.  3. The mitral valve is grossly normal. Mild mitral valve regurgitation. No evidence of mitral stenosis.  4. Tricuspid valve regurgitation is mild.  5. The aortic valve is tricuspid. There is mild thickening of the aortic valve. Aortic valve regurgitation is not visualized. Aortic valve sclerosis  is present, with no evidence of aortic valve stenosis.  6. The inferior vena cava is normal in size with greater than 50% respiratory variability, suggesting right atrial pressure of 3 mmHg.              Hyperthyroidism:   Lab Results  Component Value Date   TSH 3.06 07/31/2022  Sp ablation    obesity-   BMI Readings from Last 1 Encounters:  09/23/24 37.77 kg/m        A. Fib -   atrial fibrillation CHA2DS2 vas score  4   current  on anticoagulation with Eliquis ,       Mobitz I Second Degree AVB  - avoid AV nodal blockers     Chronic anemia - baseline hg Hemoglobin & Hematocrit  Recent Labs    08/17/24 1001 09/23/24 2152  09/23/24 2224  HGB 12.0 11.6* 11.9*   Iron/TIBC/Ferritin/ %Sat    Component Value Date/Time   IRON 168 (H) 03/20/2022 1103   TIBC 406.0 11/21/2021 1142   FERRITIN 133.8 03/20/2022 1103   IRONPCTSAT 17.0 (L) 11/21/2021 1142      While in ER:   CTA showed no PE  esophageal infiltrative changes unclear etiology needs further investigation Bronchomalacia suggestive of bronchial wall inflammation may explain some of the wheezing shortness of breath     Lab Orders         Resp panel by RT-PCR (RSV, Flu A&B, Covid) Anterior Nasal Swab         CBC with Differential/Platelet         Pro Brain natriuretic peptide         I-stat chem 8, ed       CXR -cardiomegaly   CTA chest -  , no PE,  no evidence of infiltrate  Following Medications were ordered in ER: Medications  furosemide  (LASIX ) injection 40 mg (has no administration in time range)  albuterol  (PROVENTIL ) (2.5 MG/3ML) 0.083% nebulizer solution 5 mg (5 mg Nebulization Given 09/23/24 2219)  ipratropium (ATROVENT ) nebulizer solution 0.5 mg (0.5 mg Nebulization Given 09/23/24 2219)  iohexol  (OMNIPAQUE ) 350 MG/ML injection 75 mL (75 mLs Intravenous Contrast Given 09/23/24 2258)        ED Triage Vitals  Encounter Vitals Group     BP 09/23/24 1946 (!) 153/57     Girls Systolic BP Percentile --      Girls Diastolic BP Percentile --      Boys Systolic BP Percentile --      Boys Diastolic BP Percentile --      Pulse Rate 09/23/24 1946 60     Resp 09/23/24 1946 18     Temp 09/23/24 1946 98 F (36.7 C)     Temp Source 09/23/24 1946 Oral     SpO2 09/23/24 1946 92 %     Weight 09/23/24 1951 234 lb (106.1 kg)     Height 09/23/24 1951 5' 6 (1.676 m)     Head Circumference --      Peak Flow --      Pain Score --      Pain Loc --      Pain Education --      Exclude from Growth Chart --   UFJK(75)@     _________________________________________ Significant initial  Findings: Abnormal Labs Reviewed  CBC WITH  DIFFERENTIAL/PLATELET - Abnormal; Notable for the following components:      Result Value   Hemoglobin 11.6 (*)    All other components within normal limits  PRO BRAIN NATRIURETIC PEPTIDE - Abnormal; Notable for the following components:   Pro Brain Natriuretic Peptide 1,560.0 (*)    All other components within normal limits  I-STAT CHEM 8, ED - Abnormal; Notable for the following components:   Hemoglobin 11.9 (*)    HCT 35.0 (*)    All other components within normal limits      _________________________ Troponin 16 Cardiac Panel (last 3 results) No results for input(s): CKTOTAL, CKMB, TROPONINIHS, RELINDX in the last 72 hours.   ECG: Ordered Personally reviewed and interpreted by me showing: HR : 61 Rhythm: A.fib.  with conduction delay  no evidence of ischemic changes [id number]  BNP  1560    The recent clinical data is shown below. Vitals:   09/23/24 1946 09/23/24 1951 09/23/24 2252  BP: (!) 153/57  (!) 131/48  Pulse: 60  (!) 56  Resp: 18  17  Temp: 98 F (36.7 C)  98.1 F (36.7 C)  TempSrc: Oral  Oral  SpO2: 92%  96%  Weight:  106.1 kg   Height:  5' 6 (1.676 m)     WBC     Component Value Date/Time   WBC 9.9 09/23/2024 2152   LYMPHSABS 1.3 09/23/2024 2152   MONOABS 0.9 09/23/2024 2152   EOSABS 0.1 09/23/2024 2152   BASOSABS 0.0 09/23/2024 2152      UA  ordered    Results for orders placed or performed during the hospital encounter of 09/23/24  Resp panel by RT-PCR (RSV, Flu A&B, Covid) Anterior Nasal Swab     Status: None   Collection Time: 09/23/24  7:59 PM   Specimen: Anterior Nasal Swab  Result Value Ref Range Status   SARS Coronavirus 2 by RT PCR NEGATIVE NEGATIVE Final         Influenza A by PCR NEGATIVE NEGATIVE Final   Influenza B by PCR NEGATIVE NEGATIVE Final         Resp Syncytial Virus by PCR NEGATIVE NEGATIVE Final         ____________________________________________________ Recent Labs  Lab 09/23/24 2224  NA 141  K 4.2   GLUCOSE 91  BUN 21  CREATININE 1.00    Cr  Up from baseline see below Lab Results  Component Value Date   CREATININE 1.00 09/23/2024   CREATININE 0.72 08/17/2024   CREATININE 0.89 06/18/2023    No results for input(s): AST, ALT, ALKPHOS, BILITOT, PROT, ALBUMIN in the last 168 hours. Lab Results  Component Value Date   CALCIUM  9.1 08/17/2024       Plt: Lab Results  Component Value Date   PLT 171 09/23/2024       Recent Labs  Lab 09/23/24 2152 09/23/24 2224  WBC 9.9  --   NEUTROABS 7.5  --   HGB 11.6* 11.9*  HCT 36.6 35.0*  MCV 93.6  --   PLT 171  --     HG/HCT   stable,     Component Value Date/Time   HGB 11.9 (L) 09/23/2024 2224   HGB 14.3 10/15/2022 1249   HCT 35.0 (L) 09/23/2024 2224   HCT 42.2 10/15/2022 1249   MCV 93.6 09/23/2024 2152   MCV 92 10/15/2022 1249     _______________________________________________ Hospitalist was called for admission for   Acute right-sided heart failure  , bronchitis  The following Work up has been ordered so far:  Orders Placed This Encounter  Procedures   Resp panel by RT-PCR (RSV, Flu A&B, Covid) Anterior Nasal Swab  DG Chest 2 View   CT Angio Chest PE W/Cm &/Or Wo Cm   CBC with Differential/Platelet   Pro Brain natriuretic peptide   Consult to hospitalist   I-stat chem 8, ed   EKG 12-Lead   Saline lock IV     OTHER Significant initial  Findings:  labs showing:     DM  labs:  HbA1C: Recent Labs    08/17/24 1001  HGBA1C 6.0       CBG (last 3)  No results for input(s): GLUCAP in the last 72 hours.        Cultures: No results found for: SDES, SPECREQUEST, CULT, REPTSTATUS   Radiological Exams on Admission: CT Angio Chest PE W/Cm &/Or Wo Cm Result Date: 09/23/2024 EXAM: CTA CHEST AORTA 09/23/2024 11:08:19 PM TECHNIQUE: CTA of the chest was performed after the administration of intravenous contrast. Multiplanar reformatted images are provided for review. MIP images are  provided for review. Automated exposure control, iterative reconstruction, and/or weight based adjustment of the mA/kV was utilized to reduce the radiation dose to as low as reasonably achievable. COMPARISON: None available. CLINICAL HISTORY: Pulmonary embolism (PE) suspected, high prob. FINDINGS: AORTA: Mild atherosclerotic calcification within the thoracic aorta. No aortic aneurysm. No thoracic aortic dissection. No intramural hematoma. No aortic ulceration. No rupture. MEDIASTINUM: Moderate multivessel coronary artery calcification. Mild-to-moderate cardiomegaly with right ventricular and right atrial enlargement. There is reflux with contrast at the hepatic venous system in keeping with at least some degree of right heart failure. No pericardial effusion. Central pulmonary arteries are of normal caliber. No pulmonary embolism. LYMPH NODES: No mediastinal, hilar or axillary lymphadenopathy. LUNGS AND PLEURA: Bibasilar atelectasis. Segmental air trapping within the medial and anterior segment of the right lower lobe, possibly related to small airways disease or bronchial atresia given its isolated nature. Bronchomalacia involving the bronchus intermedius best seen on image 57/12 with collapse on this expiratory phase examination. Bronchial wall thickening noted in keeping with airway inflammation. No focal consolidation or pulmonary edema. No pleural effusion or pneumothorax. UPPER ABDOMEN: Cholelithiasis partially visualized. SOFT TISSUES AND BONES: There is relatively poor delineation of the thyroid  gland with infiltrative changes surrounding the esophagus and carotid vasculature at the thoracic inlet. This appearance may be in part related to low-dose imaging and bolus timing for this radicular study, however, an infiltrative process such as anaplastic thyroid  cancer, or changes of prior radiation therapy could appear this fashion. Esophagus otherwise unremarkable. Osseous structures are age appropriate. No  acute bone abnormality. No lytic or blastic bone lesion. IMPRESSION: 1. No evidence of pulmonary embolism. 2. Mild-to-moderate cardiomegaly with right heart enlargement and reflux of contrast into the hepatic venous system, consistent with right heart failure. 3. Infiltrative changes surrounding the esophagus and carotid vasculature at the thoracic inlet with poor delineation of the thyroid ; given the possibility of an infiltrative thyroid  process with extension into the deep soft tissues of the thoracic inlet, neck CT or MRI with contrast is recommended for further evaluation. 4. Bronchoalacia involving the bronchus intermedius with collapse on this expiratory phase examination. Bronchial wall thickening in keeping with airway inflammation. Electronically signed by: Dorethia Molt MD 09/23/2024 11:32 PM EST RP Workstation: [id number]   DG Chest 2 View Result Date: 09/23/2024 CLINICAL DATA:  Cough and shortness of breath EXAM: CHEST - 2 VIEW COMPARISON:  Chest x-ray 10/12/2011 FINDINGS: The heart is enlarged. Loop recorder device again seen. No pleural effusion or pneumothorax. Both lungs are clear. The visualized skeletal structures are unremarkable. IMPRESSION: 1.  No active cardiopulmonary disease. 2. Cardiomegaly. Electronically Signed   By: Greig Pique M.D.   On: 09/23/2024 20:30   _______________________________________________________________________________________________________ Latest  Blood pressure (!) 131/48, pulse (!) 56, temperature 98.1 F (36.7 C), temperature source Oral, resp. rate 17, height 5' 6 (1.676 m), weight 106.1 kg, last menstrual period 11/05/1995, SpO2 96%.   Vitals  labs and radiology finding personally reviewed  Review of Systems:    Pertinent positives include:  Bilateral lower extremity swelling  shortness of breath at rest.  Constitutional:  No weight loss, night sweats, Fevers, chills, fatigue, weight loss  HEENT:  No headaches, Difficulty  swallowing,Tooth/dental problems,Sore throat,  No sneezing, itching, ear ache, nasal congestion, post nasal drip,  Cardio-vascular:  No chest pain, Orthopnea, PND, anasarca, dizziness, palpitations.no  GI:  No heartburn, indigestion, abdominal pain, nausea, vomiting, diarrhea, change in bowel habits, loss of appetite, melena, blood in stool, hematemesis Resp:  no No dyspnea on exertion, No excess mucus, no productive cough, No non-productive cough, No coughing up of blood.No change in color of mucus.No wheezing. Skin:  no rash or lesions. No jaundice GU:  no dysuria, change in color of urine, no urgency or frequency. No straining to urinate.  No flank pain.  Musculoskeletal:  No joint pain or no joint swelling. No decreased range of motion. No back pain.  Psych:  No change in mood or affect. No depression or anxiety. No memory loss.  Neuro: no localizing neurological complaints, no tingling, no weakness, no double vision, no gait abnormality, no slurred speech, no confusion  All systems reviewed and apart from HOPI all are negative _______________________________________________________________________________________________ Past Medical History:   Past Medical History:  Diagnosis Date   Atrial fibrillation (HCC) 10/04/2021   Carotid bruit    w/ neg dopplers    CHF (congestive heart failure) (HCC)    History of gastric ulcer 10/04/2021   Hyperthyroidism     s/p RAI 2004   Normocytic anemia    Persistent atrial fibrillation (HCC)    a. Dx 07/2021; b. CHA2DS2VASc = 3-->Eliquis ; c. 10/2021 s/p DCCV.   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   Vitamin D  deficiency       Past Surgical History:  Procedure Laterality Date   ABDOMINAL HYSTERECTOMY  11/05/1995   TAH-LEIOMYOMATA-- no BSO   BIOPSY  12/25/2021   Procedure: BIOPSY;  Surgeon: Legrand Victory LITTIE DOUGLAS, MD;  Location: [facility];  Service: Gastroenterology;;   BIOPSY THYROID       bx of largest nodule 2005,  benign   BREAST SURGERY     x's 2--fibrocystic dz   CARDIOVERSION N/A 10/10/2021   Procedure: CARDIOVERSION;  Surgeon: Francyne Headland, MD;  Location: [facility];  Service: Cardiovascular;  Laterality: N/A;   CARDIOVERSION N/A 08/23/2022   Procedure: CARDIOVERSION;  Surgeon: Loni Soyla LABOR, MD;  Location: [facility];  Service: Cardiovascular;  Laterality: N/A;   CATARACT EXTRACTION, BILATERAL Bilateral    COLONOSCOPY WITH PROPOFOL  N/A 12/25/2021   Procedure: COLONOSCOPY WITH PROPOFOL ;  Surgeon: Legrand Victory LITTIE DOUGLAS, MD;  Location: [facility];  Service: Gastroenterology;  Laterality: N/A;   ESOPHAGOGASTRODUODENOSCOPY (EGD) WITH PROPOFOL  N/A 10/12/2021   Procedure: ESOPHAGOGASTRODUODENOSCOPY (EGD) WITH PROPOFOL ;  Surgeon: Legrand Victory LITTIE DOUGLAS, MD;  Location: [facility]COPY;  Service: Gastroenterology;  Laterality: N/A;   ESOPHAGOGASTRODUODENOSCOPY (EGD) WITH PROPOFOL  N/A 12/25/2021   Procedure: ESOPHAGOGASTRODUODENOSCOPY (EGD) WITH PROPOFOL ;  Surgeon: Legrand Victory LITTIE DOUGLAS, MD;  Location: [facility];  Service: Gastroenterology;  Laterality: N/A;   POLYPECTOMY  12/25/2021  Procedure: POLYPECTOMY;  Surgeon: Legrand Victory LITTIE DOUGLAS, MD;  Location: [facility];  Service: Gastroenterology;;   TONSILLECTOMY      Social History:  Ambulatory   cane, walker    reports that she has never smoked. She has never used smokeless tobacco. She reports that she does not drink alcohol and does not use drugs.    Family History:   Family History  Problem Relation Age of Onset   Seizures Father        Epilepsy   Hypertension Father    Heart attack Father        ?   Breast cancer Sister 76   Heart disease Mother    Hypertension Mother    Colon cancer Neg Hx    Pancreatic cancer Neg Hx    Esophageal cancer Neg Hx    Prostate cancer Neg Hx    Rectal cancer Neg Hx    ______________________________________________________________________________________________ Allergies: No Known  Allergies   Prior to Admission medications   Medication Sig Start Date End Date Taking? Authorizing Provider  Ascorbic Acid (VITAMIN C) 1000 MG tablet Take 1,000 mg by mouth daily.   Yes [provider]  cholecalciferol (VITAMIN D ) 1000 UNITS tablet Take 1,000 Units by mouth daily.   Yes [provider]  ELIQUIS  5 MG TABS tablet TAKE 1 TABLET BY MOUTH TWICE A DAY 09/01/24  Yes Croitoru, Mihai, MD  ezetimibe  (ZETIA ) 10 MG tablet Take 1 tablet (10 mg total) by mouth daily. 02/10/24  Yes Croitoru, Mihai, MD  furosemide  (LASIX ) 20 MG tablet As needed patient may take 40 MG Lasix  PRN by mouth QD x 4 days as directed per [employer] HF Study. PLEASE DO NOT REMOVE PRESCRIPTION PRN ONLY IF DIRECTED 10/07/23 10/07/24 Yes Cindie Ole DASEN, MD  furosemide  (LASIX ) 40 MG tablet TAKE 1 TABLET EVERY DAY 03/15/24  Yes Croitoru, Mihai, MD  Multiple Vitamin (MULTIVITAMIN WITH MINERALS) TABS tablet Take 1 tablet by mouth in the morning. Alive   Yes [provider]  potassium chloride  SA (KLOR-CON  M) 20 MEQ tablet Take 1 tablet (20 mEq total) by mouth daily. 09/02/24  Yes Croitoru, Mihai, MD  rosuvastatin  (CRESTOR ) 40 MG tablet Take 1 tablet (40 mg total) by mouth at bedtime. 03/30/24  Yes Paz, Jose E, MD  vitamin A 10000 UNIT capsule Take 10,000 Units by mouth daily.   Yes [provider]  vitamin B-12 (CYANOCOBALAMIN ) 1000 MCG tablet Take 1,000 mcg by mouth daily.   Yes [provider]  zinc gluconate 50 MG tablet Take 50 mg by mouth daily.   Yes [provider]  calcium  carbonate (OS-CAL - DOSED IN MG OF ELEMENTAL CALCIUM ) 1250 MG tablet Take 1,250 mg by mouth daily with breakfast.    [provider]  vitamin E 400 UNIT capsule Take 400 Units by mouth daily.    [provider]    ___________________________________________________________________________________________________ Physical Exam:    09/23/2024   10:52 PM 09/23/2024     7:51 PM 09/23/2024    7:46 PM  Vitals with BMI  Height  5' 6   Weight  234 lbs   BMI  37.79   Systolic 131  153  Diastolic 48  57  Pulse 56  60     1. General:  in No  Acute distress   Chronically ill   -appearing 2. Psychological: Alert and   Oriented 3. Head/ENT:   Moist  Mucous Membranes  Head Non traumatic, neck supple                           Poor Dentition 4. SKIN: normal   Skin turgor,  Skin clean Dry and intact no rash    5. Heart: Regular rate and rhythm no  Murmur, no Rub or gallop 6. Lungs: some  wheezes no  crackles   7. Abdomen: Soft,  non-tender, Non distended   obese  bowel sounds present 8. Lower extremities: no clubbing, cyanosis,1+ edema 9. Neurologically Grossly intact, moving all 4 extremities equally   10. MSK: Normal range of motion    Chart has been reviewed  ______________________________________________________________________________________________  Assessment/Plan 73 y.o. 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 block Mobitz type 1, right sided heart failure, thyroid  disease s/p radioiodide ablation   Admitted for   Acute right-sided heart failure (HCC)   And bronchitis  Present on Admission:  Acute on chronic diastolic (congestive) heart failure (HCC)  Hypothyroidism  PAF (paroxysmal atrial fibrillation) (HCC)  Pulmonary hypertension, unspecified (HCC)  Esophageal abnormality  Acute on chronic diastolic CHF (congestive heart failure) (HCC)  Bronchitis     Acute on chronic diastolic (congestive) heart failure (HCC) Vs right hear failure - Pt diagnosed with CHF based on presence of the following:  PND, OA,  and   bilateral leg edema, DOE,   With noted response to IV diuretic in ER  admit on telemetry,  cycle cardiac enzymes   obtain serial ECG  to evaluate for ischemia as a cause of heart failure  monitor daily weight:  Filed Weights   09/23/24 1951  Weight: 106.1 kg       diurese with IV lasix    40 mg IV Daily and monitor orthostatics and creatinine to avoid over diuresis.  Order echogram to evaluate EF and valves    Hypothyroidism - Check TSH continue home medications     PAF (paroxysmal atrial fibrillation) (HCC) Continue Eliquis  5 mg po bid    Pulmonary hypertension, unspecified (HCC) Likely contributing to increased dyspnea,  Will need follow up with Cardiology   Esophageal abnormality Will need MRI once stable  Vs neck CT since pt just had recent contrasted study will need to schedule at a later time  Infiltrative changes surrounding the esophagus and carotid vasculature at the thoracic inlet with poor delineation of the thyroid ; given the possibility of an infiltrative thyroid  process with extension into the deep soft tissues of the thoracic inlet, neck CT or MRI with contrast is recommended for further evaluation.  Bronchitis CTA showing no evidence of infiltrate but there is evidence of bronchomalacia and some airway inflammation which would be consistent with bronchitis Patient may benefit from a course of steroids  To see if that may help   Other plan as per orders.  DVT prophylaxis:  eliquis     Code Status:    Code Status: Prior FULL CODE  as per patient   I had personally discussed CODE STATUS with patient   ACP   none   Family Communication:   Family not at  Bedside    Diet heart healthy   Disposition Plan:     To home once workup is complete and patient is stable   Following barriers for discharge:  Electrolytes corrected                                                       Work of breathing improves       Consult Orders  (From admission, onward)           Start     Ordered   09/23/24 2337  Consult to hospitalist  Once       Provider:  (Not yet assigned)  Question Answer Comment  Place call to: [facility]   Reason for Consult Admit       09/23/24 2336                                Consults called:    NONE   Admission status:  ED Disposition     ED Disposition  Admit   Condition  --   Comment  Hospital Area: [facility] [[id number]]  Level of Care: Telemetry [5]  Admit to tele based on following criteria: Other see comments  Comments: right heart failure  May place patient in observation at [facility] or [facility] if equivalent level of care is available:: No  Diagnosis: Acute on chronic diastolic CHF (congestive heart failure) [facility]) [[id number]]  Admitting Physician: Aimi Essner [[id number]]  Attending Physician: Arjen Deringer [[id number]]           Obs     Level of care     tele  For   24H    Anays Detore 09/24/2024, 1:09 AM    [facility]     after 2 AM please page floor coverage   If 7AM-7PM, please contact the day team taking care of the patient using [email or web address]

## 2024-09-23 NOTE — H&P (Incomplete)
 Brittney Walker FMW:[id number] DOB: 1951/01/06 DOA: 09/23/2024     PCP: Amon Aloysius BRAVO, MD   Outpatient Specialists:   CARDS: Dr. Jerel Balding, MD    Patient arrived to ER on 09/23/24 at 1927 Referred by Attending Dasie Faden, MD   Patient coming from:    home Lives alone,   *** With family     Chief Complaint:   Chief Complaint  Patient presents with  . Cough    HPI: Brittney Walker is a 73 y.o. female with medical history significant of ***     Presented with   * Patient was seen by podiatry today with severe foot pain she has known history of A-fib CHF hypothyroidism and anemia and had a steroid injection done today She came to emergency department later on complaining of leg swelling chronic cough shortness of breath wheezing for the past week Also had a laceration of the left hand with a knife which happened yesterday and bleeding hemorrhoids. Patient feels like her shortness of breath has been somewhat chronic but dyspnea on exertion and orthopnea getting worse States she is compliant with her Eliquis  Unable to lay flat No fevers no chills No chest pain No nausea no vomiting no diarrhea Supposed to be on Lasix  40 mg a day Chest x-ray nonacute COVID and flu test normal Noted to have elevated BNP CT angio done showed right-sided heart failure      Denies significant ETOH intake *** Does not smoke*** but interested in quitting***  Denies marijuana use ***    Regarding pertinent Chronic problems: ***  ****Hyperlipidemia - *on statins {statin:315258}  Lipid Panel     Component Value Date/Time   CHOL 169 06/04/2024 1545   TRIG 92 06/04/2024 1545   HDL 63 06/04/2024 1545   CHOLHDL 2.7 06/04/2024 1545   CHOLHDL 3.2 10/01/2023 1520   VLDL 20.0 05/26/2023 1110   LDLCALC 89 06/04/2024 1545   LDLCALC 138 (H) 10/01/2023 1520   LDLDIRECT 141.1 05/01/2009 0947   LABVLDL 17 06/04/2024 1545    ***HTN on   ***chronic CHF diastolic/systolic/ combined -  last echo*** Recent Results (from the past 56199 hours)  ECHOCARDIOGRAM COMPLETE   Collection Time: 03/04/23 11:45 AM  Result Value   Area-P 1/2 3.12   S' Lateral 3.10   Est EF 60 - 65%   Narrative      ECHOCARDIOGRAM REPORT       Patient Name:   ADALAYA IRION Date of Exam: 03/04/2023 Medical Rec #:  [id number]        Height:       66.0 in Accession #:    [id number]       Weight:       230.6 lb Date of Birth:  1951-08-23        BSA:          2.125 m Patient Age:    72 years         BP:           132/86 mmHg Patient Gender: F                HR:           56 bpm. Exam Location:  [address]  Procedure: 2D Echo, Cardiac Doppler, Color Doppler and Intracardiac            Opacification Agent  Indications:    I48.91 Atrial Fibrillation   History:  Patient has prior history of Echocardiogram examinations, most                 recent 11/08/2021. CHF; Arrythmias:Atrial Fibrillation.   Sonographer:    Carl Rodgers-Jones RDCS Referring Phys: [id number] MIHAI CROITORU  IMPRESSIONS    1. Left ventricular ejection fraction, by estimation, is 60 to 65%. The left ventricle has normal function. The left ventricle has no regional wall motion abnormalities. Left ventricular diastolic parameters are indeterminate.  2. Right ventricular systolic function is normal. The right ventricular size is normal. There is normal pulmonary artery systolic pressure. The estimated right ventricular systolic pressure is 22.9 mmHg.  3. The mitral valve is grossly normal. Mild mitral valve regurgitation. No evidence of mitral stenosis.  4. Tricuspid valve regurgitation is mild.  5. The aortic valve is tricuspid. There is mild thickening of the aortic valve. Aortic valve regurgitation is not visualized. Aortic valve sclerosis is present, with no evidence of aortic valve stenosis.  6. The inferior vena cava is normal in size with greater than 50% respiratory variability, suggesting right atrial pressure of 3  mmHg.  FINDINGS  Left Ventricle: Left ventricular ejection fraction, by estimation, is 60 to 65%. The left ventricle has normal function. The left ventricle has no regional wall motion abnormalities. Definity  contrast agent was given IV to delineate the left ventricular  endocardial borders. The left ventricular internal cavity size was normal in size. There is no left ventricular hypertrophy. Left ventricular diastolic parameters are indeterminate.  Right Ventricle: The right ventricular size is normal. No increase in right ventricular wall thickness. Right ventricular systolic function is normal. There is normal pulmonary artery systolic pressure. The tricuspid regurgitant velocity is 2.23 m/s, and  with an assumed right atrial pressure of 3 mmHg, the estimated right ventricular systolic pressure is 22.9 mmHg.  Left Atrium: Left atrial size was normal in size.  Right Atrium: Right atrial size was normal in size.  Pericardium: There is no evidence of pericardial effusion.  Mitral Valve: The mitral valve is grossly normal. Mild mitral valve regurgitation. No evidence of mitral valve stenosis.  Tricuspid Valve: Tricuspid valve regurgitation is mild. The tricuspid valve is normal in structure. Tricuspid valve regurgitation is mild . No evidence of tricuspid stenosis.  Aortic Valve: The aortic valve is tricuspid. There is mild thickening of the aortic valve. Aortic valve regurgitation is not visualized. Aortic valve sclerosis is present, with no evidence of aortic valve stenosis.  Pulmonic Valve: The pulmonic valve was not well visualized. Pulmonic valve regurgitation is not visualized. No evidence of pulmonic stenosis.  Aorta: The aortic root is normal in size and structure.  Venous: The inferior vena cava is normal in size with greater than 50% respiratory variability, suggesting right atrial pressure of 3 mmHg.  IAS/Shunts: No atrial level shunt detected by color flow Doppler.    LEFT  VENTRICLE PLAX 2D LVIDd:         5.50 cm   Diastology LVIDs:         3.10 cm   LV e' medial:    6.96 cm/s LV PW:         0.80 cm   LV E/e' medial:  8.8 LV IVS:        0.80 cm   LV e' lateral:   8.27 cm/s LVOT diam:     2.00 cm   LV E/e' lateral: 7.4 LV SV:         53 LV SV Index:   25  LVOT Area:     3.14 cm    RIGHT VENTRICLE             IVC RV Basal diam:  3.80 cm     IVC diam: 2.00 cm RV S prime:     13.75 cm/s TAPSE (M-mode): 2.2 cm  LEFT ATRIUM             Index        RIGHT ATRIUM           Index LA diam:        5.20 cm 2.45 cm/m   RA Area:     18.70 cm LA Vol (A2C):   39.6 ml 18.64 ml/m  RA Volume:   53.60 ml  25.23 ml/m LA Vol (A4C):   46.6 ml 21.93 ml/m LA Biplane Vol: 43.4 ml 20.43 ml/m  AORTIC VALVE LVOT Vmax:   68.70 cm/s LVOT Vmean:  50.800 cm/s LVOT VTI:    0.169 m   AORTA Ao Root diam: 2.90 cm Ao Asc diam:  3.00 cm  MITRAL VALVE               TRICUSPID VALVE MV Area (PHT): 3.12 cm    TR Peak grad:   19.9 mmHg MV Decel Time: 243 msec    TR Vmax:        223.00 cm/s MV E velocity: 61.10 cm/s MV A velocity: 61.45 cm/s  SHUNTS MV E/A ratio:  0.99        Systemic VTI:  0.17 m                            Systemic Diam: 2.00 cm  Soyla Merck MD Electronically signed by Soyla Merck MD Signature Date/Time: 03/04/2023/3:38:37 PM       Final     *Note: Due to a large number of results and/or encounters for the requested time period, some results have not been displayed. A complete set of results can be found in Results Review.    *** CAD  - On Aspirin, statin, betablocker, Plavix                 - *followed by cardiology                - last cardiac cath       ***DM 2 -  Lab Results  Component Value Date   HGBA1C 6.0 08/17/2024   ****on insulin, PO meds only, diet controlled  ***Hypothyroidism:   Lab Results  Component Value Date   TSH 3.06 07/31/2022   on synthroid  *** Morbid obesity-   BMI Readings from Last 1 Encounters:   09/23/24 37.77 kg/m     *** Asthma -well *** controlled on home inhalers/ nebs                     *** COPD - not **followed by pulmonology *** not  on baseline oxygen  *L,    *** OSA -on nocturnal oxygen, *CPAP, *noncompliant with CPAP  *** Hx of CVA - *with/out residual deficits on Aspirin 81 mg, 325, Plavix  ***A. Fib -   atrial fibrillation CHA2DS2 vas score **** CHA2DS2/VAS Stroke Risk Points  Current as of 21 minutes ago     3 >= 2 Points: High Risk  1 to 1.99 Points: Medium Risk  0 Points: Low Risk    Last Change: N/A  Details    This score determines the patient's risk of having a stroke if the  patient has atrial fibrillation.       Points Metrics  1 Has Congestive Heart Failure:  Yes    Current as of 21 minutes ago  0 Has Vascular Disease:  No    Current as of 21 minutes ago  0 Has Hypertension:  No    Current as of 21 minutes ago  1 Age:  56    Current as of 21 minutes ago  0 Has Diabetes Excluding Gestational Diabetes:  No    Current as of 21 minutes ago  0 Had Stroke:  No  Had TIA:  No  Had Thromboembolism:  No    Current as of 21 minutes ago  1 Female:  Yes    Current as of 21 minutes ago           current  on anticoagulation with ****Coumadin  ***Xarelto,* Eliquis ,  *** Not on anticoagulation secondary to Risk of Falls, *** recurrent bleeding         -  Rate control:  Currently controlled with ***Toprolol,  *Metoprolol,* Diltiazem, *Coreg          - Rhythm control: *** amiodarone, *flecainide  ***Hx of DVT/PE on - anticoagulation with ****Coumadin  ***Xarelto,* Eliquis ,     ***CKD stage III*-   baseline Cr **** Estimated Creatinine Clearance: 60.8 mL/min (by C-G formula based on SCr of 1 mg/dL).  Lab Results  Component Value Date   CREATININE 1.00 09/23/2024   CREATININE 0.72 08/17/2024   CREATININE 0.89 06/18/2023   Lab Results  Component Value Date   NA 141 09/23/2024   CL 102 09/23/2024   K 4.2 09/23/2024   CO2 34 (H)  08/17/2024   BUN 21 09/23/2024   CREATININE 1.00 09/23/2024   GFR 82.24 08/17/2024   CALCIUM  9.1 08/17/2024   ALBUMIN 4.2 08/17/2024   GLUCOSE 91 09/23/2024    **** Liver disease Computed MELD 3.0 unavailable. One or more values for this score either were not found within the given timeframe or did not fit some other criterion. Computed MELD-Na unavailable. One or more values for this score either were not found within the given timeframe or did not fit some other criterion.  Hepatic Function Panel     Component Value Date/Time   PROT 7.0 08/17/2024 1001   ALBUMIN 4.2 08/17/2024 1001   AST 21 08/17/2024 1001   ALT 24 08/17/2024 1001   ALKPHOS 58 08/17/2024 1001   BILITOT 1.0 08/17/2024 1001   BILIDIR 0.0 05/01/2009 0947   No results found for requested labs within last 1095 days.  ***BPH - on Flomax, Proscar    *** Dementia - on Aricept** Nemenda  *** Chronic anemia - baseline hg Hemoglobin & Hematocrit  Recent Labs    08/17/24 1001 09/23/24 2152 09/23/24 2224  HGB 12.0 11.6* 11.9*   Iron/TIBC/Ferritin/ %Sat    Component Value Date/Time   IRON 168 (H) 03/20/2022 1103   TIBC 406.0 11/21/2021 1142   FERRITIN 133.8 03/20/2022 1103   IRONPCTSAT 17.0 (L) 11/21/2021 1142     Seizure DO - las seizure *** currently on     Cancer:     While in ER:         Lab Orders         Resp panel by RT-PCR (RSV, Flu A&B, Covid) Anterior Nasal Swab         CBC with Differential/Platelet  Pro Brain natriuretic peptide         I-stat chem 8, ed      CT HEAD *** NON acute   MRI brain  ***no acute CVA  CXR - ***NON acute  CTabd/pelvis - ***nonacute  CTA chest - ***nonacute, no PE, * no evidence of infiltrate  Following Medications were ordered in ER: Medications  furosemide  (LASIX ) injection 40 mg (has no administration in time range)  albuterol  (PROVENTIL ) (2.5 MG/3ML) 0.083% nebulizer solution 5 mg (5 mg Nebulization Given 09/23/24 2219)  ipratropium  (ATROVENT) nebulizer solution 0.5 mg (0.5 mg Nebulization Given 09/23/24 2219)  iohexol  (OMNIPAQUE ) 350 MG/ML injection 75 mL (75 mLs Intravenous Contrast Given 09/23/24 2258)    _______________________________________________________ ER Provider Called:       DrGOMEZ  They Recommend admit to medicine *** Will see in AM  ***SEEN in ER     ED Triage Vitals  Encounter Vitals Group     BP 09/23/24 1946 (!) 153/57     Girls Systolic BP Percentile --      Girls Diastolic BP Percentile --      Boys Systolic BP Percentile --      Boys Diastolic BP Percentile --      Pulse Rate 09/23/24 1946 60     Resp 09/23/24 1946 18     Temp 09/23/24 1946 98 F (36.7 C)     Temp Source 09/23/24 1946 Oral     SpO2 09/23/24 1946 92 %     Weight 09/23/24 1951 234 lb (106.1 kg)     Height 09/23/24 1951 5' 6 (1.676 m)     Head Circumference --      Peak Flow --      Pain Score --      Pain Loc --      Pain Education --      Exclude from Growth Chart --   UFJK(75)@     _________________________________________ Significant initial  Findings: Abnormal Labs Reviewed  CBC WITH DIFFERENTIAL/PLATELET - Abnormal; Notable for the following components:      Result Value   Hemoglobin 11.6 (*)    All other components within normal limits  PRO BRAIN NATRIURETIC PEPTIDE - Abnormal; Notable for the following components:   Pro Brain Natriuretic Peptide 1,560.0 (*)    All other components within normal limits  I-STAT CHEM 8, ED - Abnormal; Notable for the following components:   Hemoglobin 11.9 (*)    HCT 35.0 (*)    All other components within normal limits      _________________________ Troponin 16 Cardiac Panel (last 3 results) No results for input(s): CKTOTAL, CKMB, TROPONINIHS, RELINDX in the last 72 hours.   ECG: Ordered Personally reviewed and interpreted by me showing: HR : *** Rhythm: *NSR, Sinus tachycardia * A.fib. W RVR, RBBB, LBBB, Paced Ischemic changes*nonspecific changes, no  evidence of ischemic changes QTC*  BNP  1560    The recent clinical data is shown below. Vitals:   09/23/24 1946 09/23/24 1951 09/23/24 2252  BP: (!) 153/57  (!) 131/48  Pulse: 60  (!) 56  Resp: 18  17  Temp: 98 F (36.7 C)  98.1 F (36.7 C)  TempSrc: Oral  Oral  SpO2: 92%  96%  Weight:  106.1 kg   Height:  5' 6 (1.676 m)     WBC     Component Value Date/Time   WBC 9.9 09/23/2024 2152   LYMPHSABS 1.3 09/23/2024 2152   MONOABS 0.9 09/23/2024 2152  EOSABS 0.1 09/23/2024 2152   BASOSABS 0.0 09/23/2024 2152      UA *** no evidence of UTI  ***Pending ***not ordered   Urine analysis:    Component Value Date/Time   COLORURINE YELLOW 07/17/2021 2150   APPEARANCEUR CLEAR 07/17/2021 2150   LABSPEC 1.015 07/17/2021 2150   PHURINE 6.0 07/17/2021 2150   GLUCOSEU NEGATIVE 07/17/2021 2150   HGBUR NEGATIVE 07/17/2021 2150   BILIRUBINUR NEGATIVE 07/17/2021 2150   KETONESUR NEGATIVE 07/17/2021 2150   PROTEINUR 30 (A) 07/17/2021 2150   UROBILINOGEN 0.2 01/10/2015 1623   NITRITE NEGATIVE 07/17/2021 2150   LEUKOCYTESUR NEGATIVE 07/17/2021 2150    Results for orders placed or performed during the hospital encounter of 09/23/24  Resp panel by RT-PCR (RSV, Flu A&B, Covid) Anterior Nasal Swab     Status: None   Collection Time: 09/23/24  7:59 PM   Specimen: Anterior Nasal Swab  Result Value Ref Range Status   SARS Coronavirus 2 by RT PCR NEGATIVE NEGATIVE Final         Influenza A by PCR NEGATIVE NEGATIVE Final   Influenza B by PCR NEGATIVE NEGATIVE Final         Resp Syncytial Virus by PCR NEGATIVE NEGATIVE Final         ________________________________________________________  Arterial ***Venous  Blood Gas result:  pH *** pCO2 ***; pO2 ***;     %O2 Sat ***.  ABG    Component Value Date/Time   TCO2 27 09/23/2024 2224       __________________________________________________________ Recent Labs  Lab 09/23/24 2224  NA 141  K 4.2  GLUCOSE 91  BUN 21  CREATININE  1.00    Cr  * stable,  Up from baseline see below Lab Results  Component Value Date   CREATININE 1.00 09/23/2024   CREATININE 0.72 08/17/2024   CREATININE 0.89 06/18/2023    No results for input(s): AST, ALT, ALKPHOS, BILITOT, PROT, ALBUMIN in the last 168 hours. Lab Results  Component Value Date   CALCIUM  9.1 08/17/2024          Plt: Lab Results  Component Value Date   PLT 171 09/23/2024         Recent Labs  Lab 09/23/24 2152 09/23/24 2224  WBC 9.9  --   NEUTROABS 7.5  --   HGB 11.6* 11.9*  HCT 36.6 35.0*  MCV 93.6  --   PLT 171  --     HG/HCT * stable,  Down *Up from baseline see below    Component Value Date/Time   HGB 11.9 (L) 09/23/2024 2224   HGB 14.3 10/15/2022 1249   HCT 35.0 (L) 09/23/2024 2224   HCT 42.2 10/15/2022 1249   MCV 93.6 09/23/2024 2152   MCV 92 10/15/2022 1249      No results for input(s): LIPASE, AMYLASE in the last 168 hours. No results for input(s): AMMONIA in the last 168 hours.    _______________________________________________ Hospitalist was called for admission for *** Acute right-sided heart failure (HCC) ***    The following Work up has been ordered so far:  Orders Placed This Encounter  Procedures  . Resp panel by RT-PCR (RSV, Flu A&B, Covid) Anterior Nasal Swab  . DG Chest 2 View  . CT Angio Chest PE W/Cm &/Or Wo Cm  . CBC with Differential/Platelet  . Pro Brain natriuretic peptide  . Consult to hospitalist  . I-stat chem 8, ed  . EKG 12-Lead  . Saline lock IV  OTHER Significant initial  Findings:  labs showing:     DM  labs:  HbA1C: Recent Labs    08/17/24 1001  HGBA1C 6.0       CBG (last 3)  No results for input(s): GLUCAP in the last 72 hours.        Cultures: No results found for: SDES, SPECREQUEST, CULT, REPTSTATUS   Radiological Exams on Admission: CT Angio Chest PE W/Cm &/Or Wo Cm Result Date: 09/23/2024 EXAM: CTA CHEST AORTA 09/23/2024 11:08:19 PM  TECHNIQUE: CTA of the chest was performed after the administration of intravenous contrast. Multiplanar reformatted images are provided for review. MIP images are provided for review. Automated exposure control, iterative reconstruction, and/or weight based adjustment of the mA/kV was utilized to reduce the radiation dose to as low as reasonably achievable. COMPARISON: None available. CLINICAL HISTORY: Pulmonary embolism (PE) suspected, high prob. FINDINGS: AORTA: Mild atherosclerotic calcification within the thoracic aorta. No aortic aneurysm. No thoracic aortic dissection. No intramural hematoma. No aortic ulceration. No rupture. MEDIASTINUM: Moderate multivessel coronary artery calcification. Mild-to-moderate cardiomegaly with right ventricular and right atrial enlargement. There is reflux with contrast at the hepatic venous system in keeping with at least some degree of right heart failure. No pericardial effusion. Central pulmonary arteries are of normal caliber. No pulmonary embolism. LYMPH NODES: No mediastinal, hilar or axillary lymphadenopathy. LUNGS AND PLEURA: Bibasilar atelectasis. Segmental air trapping within the medial and anterior segment of the right lower lobe, possibly related to small airways disease or bronchial atresia given its isolated nature. Bronchomalacia involving the bronchus intermedius best seen on image 57/12 with collapse on this expiratory phase examination. Bronchial wall thickening noted in keeping with airway inflammation. No focal consolidation or pulmonary edema. No pleural effusion or pneumothorax. UPPER ABDOMEN: Cholelithiasis partially visualized. SOFT TISSUES AND BONES: There is relatively poor delineation of the thyroid  gland with infiltrative changes surrounding the esophagus and carotid vasculature at the thoracic inlet. This appearance may be in part related to low-dose imaging and bolus timing for this radicular study, however, an infiltrative process such as  anaplastic thyroid  cancer, or changes of prior radiation therapy could appear this fashion. Esophagus otherwise unremarkable. Osseous structures are age appropriate. No acute bone abnormality. No lytic or blastic bone lesion. IMPRESSION: 1. No evidence of pulmonary embolism. 2. Mild-to-moderate cardiomegaly with right heart enlargement and reflux of contrast into the hepatic venous system, consistent with right heart failure. 3. Infiltrative changes surrounding the esophagus and carotid vasculature at the thoracic inlet with poor delineation of the thyroid ; given the possibility of an infiltrative thyroid  process with extension into the deep soft tissues of the thoracic inlet, neck CT or MRI with contrast is recommended for further evaluation. 4. Bronchoalacia involving the bronchus intermedius with collapse on this expiratory phase examination. Bronchial wall thickening in keeping with airway inflammation. Electronically signed by: Dorethia Molt MD 09/23/2024 11:32 PM EST RP Workstation: [id number]   DG Chest 2 View Result Date: 09/23/2024 CLINICAL DATA:  Cough and shortness of breath EXAM: CHEST - 2 VIEW COMPARISON:  Chest x-ray 10/12/2011 FINDINGS: The heart is enlarged. Loop recorder device again seen. No pleural effusion or pneumothorax. Both lungs are clear. The visualized skeletal structures are unremarkable. IMPRESSION: 1. No active cardiopulmonary disease. 2. Cardiomegaly. Electronically Signed   By: Greig Pique M.D.   On: 09/23/2024 20:30   _______________________________________________________________________________________________________ Latest  Blood pressure (!) 131/48, pulse (!) 56, temperature 98.1 F (36.7 C), temperature source Oral, resp. rate 17, height 5' 6 (1.676 m), weight  106.1 kg, last menstrual period 11/05/1995, SpO2 96%.   Vitals  labs and radiology finding personally reviewed  Review of Systems:    Pertinent positives include: ***  Constitutional:  No weight loss,  night sweats, Fevers, chills, fatigue, weight loss  HEENT:  No headaches, Difficulty swallowing,Tooth/dental problems,Sore throat,  No sneezing, itching, ear ache, nasal congestion, post nasal drip,  Cardio-vascular:  No chest pain, Orthopnea, PND, anasarca, dizziness, palpitations.no Bilateral lower extremity swelling  GI:  No heartburn, indigestion, abdominal pain, nausea, vomiting, diarrhea, change in bowel habits, loss of appetite, melena, blood in stool, hematemesis Resp:  no shortness of breath at rest. No dyspnea on exertion, No excess mucus, no productive cough, No non-productive cough, No coughing up of blood.No change in color of mucus.No wheezing. Skin:  no rash or lesions. No jaundice GU:  no dysuria, change in color of urine, no urgency or frequency. No straining to urinate.  No flank pain.  Musculoskeletal:  No joint pain or no joint swelling. No decreased range of motion. No back pain.  Psych:  No change in mood or affect. No depression or anxiety. No memory loss.  Neuro: no localizing neurological complaints, no tingling, no weakness, no double vision, no gait abnormality, no slurred speech, no confusion  All systems reviewed and apart from HOPI all are negative _______________________________________________________________________________________________ Past Medical History:   Past Medical History:  Diagnosis Date  . Atrial fibrillation (HCC) 10/04/2021  . Carotid bruit    w/ neg dopplers   . CHF (congestive heart failure) (HCC)   . History of gastric ulcer 10/04/2021  . Hyperthyroidism     s/p RAI 2004  . Normocytic anemia   . Persistent atrial fibrillation (HCC)    a. Dx 07/2021; b. CHA2DS2VASc = 3-->Eliquis ; c. 10/2021 s/p DCCV.  . 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 Vitamin D  deficiency       Past Surgical History:  Procedure Laterality Date  . ABDOMINAL HYSTERECTOMY  11/05/1995   TAH-LEIOMYOMATA-- no BSO  . BIOPSY  12/25/2021    Procedure: BIOPSY;  Surgeon: Legrand Victory LITTIE DOUGLAS, MD;  Location: [facility];  Service: Gastroenterology;;  . BIOPSY THYROID       bx of largest nodule 2005, benign  . BREAST SURGERY     x's 2--fibrocystic dz  . CARDIOVERSION N/A 10/10/2021   Procedure: CARDIOVERSION;  Surgeon: Francyne Headland, MD;  Location: [facility];  Service: Cardiovascular;  Laterality: N/A;  . CARDIOVERSION N/A 08/23/2022   Procedure: CARDIOVERSION;  Surgeon: Loni Soyla LABOR, MD;  Location[facility] ENDOSCOPY;  Service: Cardiovascular;  Laterality: N/A;  . CATARACT EXTRACTION, BILATERAL Bilateral   . COLONOSCOPY WITH PROPOFOL  N/A 12/25/2021   Procedure: COLONOSCOPY WITH PROPOFOL ;  Surgeon: Legrand Victory LITTIE DOUGLAS, MD;  Location: [facility] ENDOS[facility];  Service: Gastroenterology;  Laterality: N/A;  . ESOPHAGOGASTRODUODENOSCOPY (EGD) WITH PROPOFOL  N/A 10/12/2021   Procedure: ESOPHAGOGASTRODUODENOSCOPY (EGD) WITH PROPOFOL ;  Surgeon: Legrand Victory LITTIE DOUGLAS, MD;  Location: [facility] ENDOSCOPY;  Service: Gastroenterology;  Laterality: N/A;  . ESOPHAGOGASTRODUODENOSCOPY (EGD) WITH PROPOFOL  N/A 12/25/2021   Procedure: ESOPHAGOGASTRODUODENOSCOPY (EGD) WITH PROPOFOL ;  Surgeon: Legrand Victory LITTIE DOUGLAS, MD;  Location: [facility] ENDOSCOPY;  Service: Gastroenterology;  Laterality: N/A;  . POLYPECTOMY  12/25/2021   Procedure: POLYPECTOMY;  Surgeon: Legrand Victory LITTIE DOUGLAS, MD;  Location: [facility];  Service: Gastroenterology;;  . TONSILLECTOMY      Social History:  Ambulatory *** independently cane, walker  wheelchair bound, bed bound     reports that she has never smoked. She  has never used smokeless tobacco. She reports that she does not drink alcohol and does not use drugs.     Family History: *** Family History  Problem Relation Age of Onset  . Seizures Father        Epilepsy  . Hypertension Father   . Heart attack Father        ?  [name] Breast cancer Sister 59  . Heart disease Mother   . Hypertension Mother   . Colon cancer Neg Hx   . Pancreatic  cancer Neg Hx   . Esophageal cancer Neg Hx   . Prostate cancer Neg Hx   . Rectal cancer Neg Hx    ______________________________________________________________________________________________ Allergies: No Known Allergies   Prior to Admission medications   Medication Sig Start Date End Date Taking? Authorizing Provider  Ascorbic Acid (VITAMIN C) 1000 MG tablet Take 1,000 mg by mouth daily.   Yes [provider]  cholecalciferol (VITAMIN D ) 1000 UNITS tablet Take 1,000 Units by mouth daily.   Yes [provider]  ELIQUIS  5 MG TABS tablet TAKE 1 TABLET BY MOUTH TWICE A DAY 09/01/24  Yes Croitoru, Mihai, MD  ezetimibe  (ZETIA ) 10 MG tablet Take 1 tablet (10 mg total) by mouth daily. 02/10/24  Yes Croitoru, Mihai, MD  furosemide  (LASIX ) 20 MG tablet As needed patient may take 40 MG Lasix  PRN by mouth QD x 4 days as directed per [employer] HF Study. PLEASE DO NOT REMOVE PRESCRIPTION PRN ONLY IF DIRECTED 10/07/23 10/07/24 Yes Cindie Ole DASEN, MD  furosemide  (LASIX ) 40 MG tablet TAKE 1 TABLET EVERY DAY 03/15/24  Yes Croitoru, Mihai, MD  Multiple Vitamin (MULTIVITAMIN WITH MINERALS) TABS tablet Take 1 tablet by mouth in the morning. Alive   Yes [provider]  potassium chloride  SA (KLOR-CON  M) 20 MEQ tablet Take 1 tablet (20 mEq total) by mouth daily. 09/02/24  Yes Croitoru, Mihai, MD  rosuvastatin  (CRESTOR ) 40 MG tablet Take 1 tablet (40 mg total) by mouth at bedtime. 03/30/24  Yes Paz, Jose E, MD  vitamin A 10000 UNIT capsule Take 10,000 Units by mouth daily.   Yes [provider]  vitamin B-12 (CYANOCOBALAMIN ) 1000 MCG tablet Take 1,000 mcg by mouth daily.   Yes [provider]  zinc gluconate 50 MG tablet Take 50 mg by mouth daily.   Yes [provider]  calcium  carbonate (OS-CAL - DOSED IN MG OF ELEMENTAL CALCIUM ) 1250 MG tablet Take 1,250 mg by mouth daily with breakfast.    [provider]  vitamin E 400 UNIT capsule Take  400 Units by mouth daily.    [provider]    ___________________________________________________________________________________________________ Physical Exam:    09/23/2024   10:52 PM 09/23/2024    7:51 PM 09/23/2024    7:46 PM  Vitals with BMI  Height  5' 6   Weight  234 lbs   BMI  37.79   Systolic 131  153  Diastolic 48  57  Pulse 56  60     1. General:  in No ***Acute distress***increased work of breathing ***complaining of severe pain****agitated * Chronically ill *well *cachectic *toxic acutely ill -appearing 2. Psychological: Alert and *** Oriented 3. Head/ENT:   Moist *** Dry Mucous Membranes                          Head Non traumatic, neck supple  Normal *** Poor Dentition 4. SKIN: normal *** decreased Skin turgor,  Skin clean Dry and intact no rash    5. Heart: Regular rate and rhythm no*** Murmur, no Rub or gallop 6. Lungs: ***Clear to auscultation bilaterally, no wheezes or crackles   7. Abdomen: Soft, ***non-tender, Non distended *** obese ***bowel sounds present 8. Lower extremities: no clubbing, cyanosis, no ***edema 9. Neurologically Grossly intact, moving all 4 extremities equally *** strength 5 out of 5 in all 4 extremities cranial nerves II through XII intact 10. MSK: Normal range of motion    Chart has been reviewed  ______________________________________________________________________________________________  Assessment/Plan  ***  Admitted for *** Acute right-sided heart failure (HCC) ***    Present on Admission: **None**     No problem-specific Assessment & Plan notes found for this encounter.    Other plan as per orders.  DVT prophylaxis:  SCD *** Lovenox        Code Status:    Code Status: Prior FULL CODE *** DNR/DNI ***comfort care as per patient ***family  I had personally discussed CODE STATUS with patient and family*  ACP *** none has been reviewed ***   Family Communication:   Family  not at  Bedside  plan of care was discussed on the phone with *** Son, Daughter, Wife, Husband, Sister, Brother , father, mother  Diet    Disposition Plan:   *** likely will need placement for rehabilitation                          Back to current facility when stable                            To home once workup is complete and patient is stable  ***Following barriers for discharge:                             Chest pain *** Stroke *** Syncope ***work up is complete                            Electrolytes corrected                               Anemia corrected h/H stable                             Pain controlled with PO medications                               Afebrile, white count improving able to transition to PO antibiotics                             Will need to be able to tolerate PO                            Will likely need home health, home O2, set up                           Will need consultants to evaluate patient prior to discharge  Work of breathing improves       Consult Orders  (From admission, onward)           Start     Ordered   09/23/24 2337  Consult to hospitalist  Once       Provider:  (Not yet assigned)  Question Answer Comment  Place call to: [facility]   Reason for Consult Admit      09/23/24 2336                              ***Would benefit from PT/OT eval prior to DC  Ordered                   Swallow eval - SLP ordered                   Diabetes care coordinator                   Transition of care consulted                   Nutrition    consulted                  Wound care  consulted                   Palliative care    consulted                   Behavioral health  consulted                    Consults called: ***  NONE   Admission status:  ED Disposition     ED Disposition  Admit   Condition  --   Comment  The patient appears reasonably stabilized for admission considering the  current resources, flow, and capabilities available in the ED at this time, and I doubt any other [facility] requiring further screening and/or treatment in the ED prior to admission is  present.           Obs***  ***  inpatient     I Expect 2 midnight stay secondary to severity of patient's current illness need for inpatient interventions justified by the following: ***hemodynamic instability despite optimal treatment (tachycardia *hypotension * tachypnea *hypoxia, hypercapnia) *** Severe lab/radiological/exam abnormalities including:    Acute right-sided heart failure (HCC) ***  and extensive comorbidities including: *substance abuse  *Chronic pain *DM2  * CHF * CAD  * COPD/asthma *Morbid Obesity * CKD *dementia *liver disease *history of stroke with residual deficits *  malignancy, * sickle cell disease  History of amputation Chronic anticoagulation  That are currently affecting medical management.   I expect  patient to be hospitalized for 2 midnights requiring inpatient medical care.  Patient is at high risk for adverse outcome (such as loss of life or disability) if not treated.  Indication for inpatient stay as follows:  Severe change from baseline regarding mental status Hemodynamic instability despite maximal medical therapy,  severe pain requiring acute inpatient management,  inability to maintain oral hydration   persistent chest pain despite medical management Need for operative/procedural  intervention New or worsening hypoxia ongoing suicidal ideations   Need for IV antibiotics, IV fluids,, IV pain medications, IV anticoagulation,  IV rate controling medications, IV antihypertensives need for biPAP Frequent labs    Level of care   ***  tele  For 12H 24H     medical floor       progressive     stepdown   tele indefinitely please discontinue once patient no longer qualifies COVID-19 Labs    Critical***  Patient is critically ill due to  hemodynamic  instability * respiratory failure *severe sepsis* ongoing chest pain*  They are at high risk for life/limb threatening clinical deterioration requiring frequent reassessment and modifications of care.  Services provided include examination of the patient, review of relevant ancillary tests, prescription of lifesaving therapies, review of medications and prophylactic therapy.  Total critical care time excluding separately billable procedures: 60*  Minutes.    Loralye Loberg 09/23/2024, 11:51 PM ***  [facility]     after 2 AM please page floor coverage   If 7AM-7PM, please contact the day team taking care of the patient using [email or web address]

## 2024-09-23 NOTE — Assessment & Plan Note (Signed)
Continue Eliquis 5 mg p.o. b.i.d..

## 2024-09-24 ENCOUNTER — Observation Stay (HOSPITAL_COMMUNITY)

## 2024-09-24 ENCOUNTER — Encounter (HOSPITAL_COMMUNITY): Payer: Self-pay | Admitting: Internal Medicine

## 2024-09-24 ENCOUNTER — Other Ambulatory Visit: Payer: Self-pay

## 2024-09-24 DIAGNOSIS — I5033 Acute on chronic diastolic (congestive) heart failure: Secondary | ICD-10-CM | POA: Diagnosis present

## 2024-09-24 DIAGNOSIS — J40 Bronchitis, not specified as acute or chronic: Secondary | ICD-10-CM | POA: Diagnosis present

## 2024-09-24 DIAGNOSIS — I5031 Acute diastolic (congestive) heart failure: Secondary | ICD-10-CM | POA: Diagnosis not present

## 2024-09-24 DIAGNOSIS — K229 Disease of esophagus, unspecified: Secondary | ICD-10-CM | POA: Diagnosis present

## 2024-09-24 LAB — URINALYSIS, COMPLETE (UACMP) WITH MICROSCOPIC
Bilirubin Urine: NEGATIVE
Glucose, UA: NEGATIVE mg/dL
Hgb urine dipstick: NEGATIVE
Ketones, ur: NEGATIVE mg/dL
Leukocytes,Ua: NEGATIVE
Nitrite: NEGATIVE
Protein, ur: NEGATIVE mg/dL
Specific Gravity, Urine: 1.036 — ABNORMAL HIGH (ref 1.005–1.030)
pH: 5 (ref 5.0–8.0)

## 2024-09-24 LAB — ECHOCARDIOGRAM COMPLETE
AR max vel: 2.1 cm2
AV Area VTI: 2.13 cm2
AV Area mean vel: 1.83 cm2
AV Mean grad: 7 mmHg
AV Peak grad: 10.4 mmHg
Ao pk vel: 1.61 m/s
Area-P 1/2: 3.86 cm2
Height: 66 in
S' Lateral: 3.2 cm
Weight: 3744 [oz_av]

## 2024-09-24 LAB — MAGNESIUM
Magnesium: 2.3 mg/dL (ref 1.7–2.4)
Magnesium: 2.2 mg/dL (ref 1.7–2.4)

## 2024-09-24 LAB — FERRITIN: Ferritin: 217 ng/mL (ref 11–307)

## 2024-09-24 LAB — RETICULOCYTES
Immature Retic Fract: 19.7 % — ABNORMAL HIGH (ref 2.3–15.9)
RBC.: 3.57 MIL/uL — ABNORMAL LOW (ref 3.87–5.11)
Retic Count, Absolute: 51.1 K/uL (ref 19.0–186.0)
Retic Ct Pct: 1.4 % (ref 0.4–3.1)

## 2024-09-24 LAB — IRON AND TIBC
Iron: 26 ug/dL — ABNORMAL LOW (ref 28–170)
Saturation Ratios: 8 % — ABNORMAL LOW (ref 10.4–31.8)
TIBC: 332 ug/dL (ref 250–450)
UIBC: 306 ug/dL

## 2024-09-24 LAB — COMPREHENSIVE METABOLIC PANEL WITH GFR
ALT: 26 U/L (ref 0–44)
ALT: 32 U/L (ref 0–44)
AST: 34 U/L (ref 15–41)
AST: 32 U/L (ref 15–41)
Albumin: 3.8 g/dL (ref 3.5–5.0)
Albumin: 4 g/dL (ref 3.5–5.0)
Alkaline Phosphatase: 70 U/L (ref 38–126)
Alkaline Phosphatase: 71 U/L (ref 38–126)
Anion gap: 11 (ref 5–15)
Anion gap: 11 (ref 5–15)
BUN: 17 mg/dL (ref 8–23)
BUN: 18 mg/dL (ref 8–23)
CO2: 24 mmol/L (ref 22–32)
CO2: 27 mmol/L (ref 22–32)
Calcium: 9.3 mg/dL (ref 8.9–10.3)
Calcium: 9.5 mg/dL (ref 8.9–10.3)
Chloride: 103 mmol/L (ref 98–111)
Chloride: 103 mmol/L (ref 98–111)
Creatinine, Ser: 0.78 mg/dL (ref 0.44–1.00)
Creatinine, Ser: 0.85 mg/dL (ref 0.44–1.00)
GFR, Estimated: >60 mL/min (ref >60)
GFR, Estimated: >60 mL/min (ref >60)
Glucose, Bld: 108 mg/dL — ABNORMAL HIGH (ref 70–99)
Glucose, Bld: 162 mg/dL — ABNORMAL HIGH (ref 70–99)
Potassium: 3.9 mmol/L (ref 3.5–5.1)
Potassium: 3.8 mmol/L (ref 3.5–5.1)
Sodium: 138 mmol/L (ref 135–145)
Sodium: 140 mmol/L (ref 135–145)
Total Bilirubin: 0.6 mg/dL (ref 0.0–1.2)
Total Bilirubin: 0.7 mg/dL (ref 0.0–1.2)
Total Protein: 7.7 g/dL (ref 6.5–8.1)
Total Protein: 7.6 g/dL (ref 6.5–8.1)

## 2024-09-24 LAB — CBC
HCT: 34.5 % — ABNORMAL LOW (ref 36.0–46.0)
Hemoglobin: 11.2 g/dL — ABNORMAL LOW (ref 12.0–15.0)
MCH: 30.2 pg (ref 26.0–34.0)
MCHC: 32.5 g/dL (ref 30.0–36.0)
MCV: 93 fL (ref 80.0–100.0)
Platelets: 158 K/uL (ref 150–400)
RBC: 3.71 MIL/uL — ABNORMAL LOW (ref 3.87–5.11)
RDW: 13.6 % (ref 11.5–15.5)
WBC: 8.9 K/uL (ref 4.0–10.5)
nRBC: 0 % (ref 0.0–0.2)

## 2024-09-24 LAB — FOLATE: Folate: 15 ng/mL (ref >5.9)

## 2024-09-24 LAB — PHOSPHORUS
Phosphorus: 3.2 mg/dL (ref 2.5–4.6)
Phosphorus: 3.1 mg/dL (ref 2.5–4.6)

## 2024-09-24 LAB — CK: Total CK: 134 U/L (ref 38–234)

## 2024-09-24 LAB — TROPONIN T, HIGH SENSITIVITY
Troponin T High Sensitivity: <15 ng/L (ref 0–19)
Troponin T High Sensitivity: <15 ng/L (ref 0–19)
Troponin T High Sensitivity: <15 ng/L (ref 0–19)

## 2024-09-24 LAB — VITAMIN B12: Vitamin B-12: 1109 pg/mL — ABNORMAL HIGH (ref 180–914)

## 2024-09-24 LAB — TSH: TSH: 1.95 u[IU]/mL (ref 0.350–4.500)

## 2024-09-24 LAB — PROTIME-INR
INR: 1.4 — ABNORMAL HIGH (ref 0.8–1.2)
Prothrombin Time: 18 s — ABNORMAL HIGH (ref 11.4–15.2)

## 2024-09-24 LAB — T4, FREE: Free T4: 0.87 ng/dL (ref 0.61–1.12)

## 2024-09-24 MED ORDER — EZETIMIBE 10 MG PO TABS
10.0000 mg | ORAL_TABLET | Freq: Every day | ORAL | Status: DC
  Administered 2024-09-24 – 2024-09-27 (×4): 10 mg via ORAL
  Filled 2024-09-24 (×4): qty 1

## 2024-09-24 MED ORDER — SODIUM CHLORIDE 0.9% FLUSH
3.0000 mL | INTRAVENOUS | Status: DC | PRN
Start: 2024-09-24 — End: 2024-09-27

## 2024-09-24 MED ORDER — PREDNISONE 20 MG PO TABS
40.0000 mg | ORAL_TABLET | Freq: Every day | ORAL | Status: DC
  Administered 2024-09-24 – 2024-09-27 (×4): 40 mg via ORAL
  Filled 2024-09-24 (×4): qty 2

## 2024-09-24 MED ORDER — FUROSEMIDE 10 MG/ML IJ SOLN
40.0000 mg | Freq: Every day | INTRAMUSCULAR | Status: DC
  Administered 2024-09-24 – 2024-09-26 (×3): 40 mg via INTRAVENOUS
  Filled 2024-09-24 (×3): qty 4

## 2024-09-24 MED ORDER — HYDROCODONE-ACETAMINOPHEN 5-325 MG PO TABS: 1.0000 | ORAL_TABLET | ORAL | Status: DC | PRN

## 2024-09-24 MED ORDER — ACETAMINOPHEN 325 MG PO TABS: 650.0000 mg | ORAL_TABLET | Freq: Four times a day (QID) | ORAL | Status: DC | PRN

## 2024-09-24 MED ORDER — ALBUTEROL SULFATE (2.5 MG/3ML) 0.083% IN NEBU: 2.5000 mg | INHALATION_SOLUTION | RESPIRATORY_TRACT | Status: DC | PRN

## 2024-09-24 MED ORDER — ONDANSETRON HCL 4 MG PO TABS: 4.0000 mg | ORAL_TABLET | Freq: Four times a day (QID) | ORAL | Status: DC | PRN

## 2024-09-24 MED ORDER — GADOBUTROL 1 MMOL/ML IV SOLN
10.0000 mL | Freq: Once | INTRAVENOUS | Status: AC | PRN
  Administered 2024-09-24: 10 mL via INTRAVENOUS

## 2024-09-24 MED ORDER — ENOXAPARIN SODIUM 100 MG/ML IJ SOSY
100.0000 mg | PREFILLED_SYRINGE | Freq: Two times a day (BID) | INTRAMUSCULAR | Status: DC
  Administered 2024-09-24 – 2024-09-27 (×7): 100 mg via SUBCUTANEOUS
  Filled 2024-09-24 (×8): qty 1

## 2024-09-24 MED ORDER — ONDANSETRON HCL 4 MG/2ML IJ SOLN: 4.0000 mg | Freq: Four times a day (QID) | INTRAMUSCULAR | Status: DC | PRN

## 2024-09-24 MED ORDER — SODIUM CHLORIDE 0.9 % IV SOLN: 250.0000 mL | INTRAVENOUS | Status: AC | PRN

## 2024-09-24 MED ORDER — ROSUVASTATIN CALCIUM 20 MG PO TABS
40.0000 mg | ORAL_TABLET | Freq: Every day | ORAL | Status: DC
  Administered 2024-09-24 – 2024-09-26 (×3): 40 mg via ORAL
  Filled 2024-09-24 (×3): qty 2

## 2024-09-24 MED ORDER — SODIUM CHLORIDE 0.9% FLUSH
3.0000 mL | Freq: Two times a day (BID) | INTRAVENOUS | Status: DC
  Administered 2024-09-24 – 2024-09-26 (×5): 3 mL via INTRAVENOUS

## 2024-09-24 MED ORDER — ACETAMINOPHEN 650 MG RE SUPP: 650.0000 mg | Freq: Four times a day (QID) | RECTAL | Status: DC | PRN

## 2024-09-24 NOTE — Assessment & Plan Note (Signed)
 Will need MRI once stable  Vs neck CT since pt just had recent contrasted study will need to schedule at a later time  Infiltrative changes surrounding the esophagus and carotid vasculature at the thoracic inlet with poor delineation of the thyroid ; given the possibility of an infiltrative thyroid  process with extension into the deep soft tissues of the thoracic inlet, neck CT or MRI with contrast is recommended for further evaluation.

## 2024-09-24 NOTE — Progress Notes (Signed)
 [facility] PROGRESS NOTE    Progress Note  Brittney Walker  FMW:[id number] DOB: 1951/04/19 DOA: 09/23/2024 PCP: Amon Aloysius BRAVO, MD     Brief Narrative:   Brittney Walker is an 73 y.o. female past medical history significant for paroxysmal atrial fibrillation on anticoagulation, Mobitz 1 AV block, right sided heart failure comes in for complaints of lower extremity swelling, chronic cough and shortness of breath, she is also been complaining of orthopnea, proBNP of 1500 CT angio of the chest showed no evidence of PE mild cardiomegaly right heart enlargement and reflux of contrast into the hepatic venous system.  It also showed infiltrative changes surrounding the esophagus and carotid vasculature at the thoracic inlet with poor delineation of the thyroid  with extension into the deep soft tissue thoracic inlet  Assessment/Plan:   Chronic diastolic CHF (congestive heart failure)/pulmonary hypertension: With bilateral lower extremity edema, orthopnea cardiac biomarkers are negative twelve-lead EKG showed no evidence of ischemia. She has elevated JVD, but she relates she is not short of breath. Unlikely heart failure to explain all the picture, she has persistent cough with this new mass in the neck see below for further details. Hold Lasix  for now, she is satting 100% on room air. Continue strict I's and O's and daily weights. Out of bed to chair consult physical therapy. 2D echo is pending.  Infiltrative changes surrounding the esophagus and carotid vasculature: Check an MRI of the neck. She has thyroid  problems.  She has positive JVD and lower extremity edema.    Hypothyroidism Continue Synthroid.  Paroxysmal atrial fibrillation: Rate controlled, change Eliquis  to Lovenox  in case procedure is needed. She is currently rate controlled.   DVT prophylaxis: Lovenox  Family Communication:none Status is: Observation The patient remains OBS appropriate and will d/c before 2  midnights.    Code Status:     Code Status Orders  (From admission, onward)           Start     Ordered   09/24/24 0040  Full code  Continuous       Question:  By:  Answer:  Consent: discussion documented in EHR   09/24/24 0040           Code Status History     Date Active Date Inactive Code Status Order ID Comments User Context   10/10/2021 2014 10/17/2021 2252 Full Code [id number]  Vivienne Lonni Ingle, NP Inpatient         IV Access:   Peripheral IV   Procedures and diagnostic studies:   CT Angio Chest PE W/Cm &/Or Wo Cm Result Date: 09/23/2024 EXAM: CTA CHEST AORTA 09/23/2024 11:08:19 PM TECHNIQUE: CTA of the chest was performed after the administration of intravenous contrast. Multiplanar reformatted images are provided for review. MIP images are provided for review. Automated exposure control, iterative reconstruction, and/or weight based adjustment of the mA/kV was utilized to reduce the radiation dose to as low as reasonably achievable. COMPARISON: None available. CLINICAL HISTORY: Pulmonary embolism (PE) suspected, high prob. FINDINGS: AORTA: Mild atherosclerotic calcification within the thoracic aorta. No aortic aneurysm. No thoracic aortic dissection. No intramural hematoma. No aortic ulceration. No rupture. MEDIASTINUM: Moderate multivessel coronary artery calcification. Mild-to-moderate cardiomegaly with right ventricular and right atrial enlargement. There is reflux with contrast at the hepatic venous system in keeping with at least some degree of right heart failure. No pericardial effusion. Central pulmonary arteries are of normal caliber. No pulmonary embolism. LYMPH NODES: No mediastinal, hilar or axillary lymphadenopathy. LUNGS AND PLEURA:  Bibasilar atelectasis. Segmental air trapping within the medial and anterior segment of the right lower lobe, possibly related to small airways disease or bronchial atresia given its isolated nature. Bronchomalacia  involving the bronchus intermedius best seen on image 57/12 with collapse on this expiratory phase examination. Bronchial wall thickening noted in keeping with airway inflammation. No focal consolidation or pulmonary edema. No pleural effusion or pneumothorax. UPPER ABDOMEN: Cholelithiasis partially visualized. SOFT TISSUES AND BONES: There is relatively poor delineation of the thyroid  gland with infiltrative changes surrounding the esophagus and carotid vasculature at the thoracic inlet. This appearance may be in part related to low-dose imaging and bolus timing for this radicular study, however, an infiltrative process such as anaplastic thyroid  cancer, or changes of prior radiation therapy could appear this fashion. Esophagus otherwise unremarkable. Osseous structures are age appropriate. No acute bone abnormality. No lytic or blastic bone lesion. IMPRESSION: 1. No evidence of pulmonary embolism. 2. Mild-to-moderate cardiomegaly with right heart enlargement and reflux of contrast into the hepatic venous system, consistent with right heart failure. 3. Infiltrative changes surrounding the esophagus and carotid vasculature at the thoracic inlet with poor delineation of the thyroid ; given the possibility of an infiltrative thyroid  process with extension into the deep soft tissues of the thoracic inlet, neck CT or MRI with contrast is recommended for further evaluation. 4. Bronchoalacia involving the bronchus intermedius with collapse on this expiratory phase examination. Bronchial wall thickening in keeping with airway inflammation. Electronically signed by: Dorethia Molt MD 09/23/2024 11:32 PM EST RP Workstation: [id number]   DG Chest 2 View Result Date: 09/23/2024 CLINICAL DATA:  Cough and shortness of breath EXAM: CHEST - 2 VIEW COMPARISON:  Chest x-ray 10/12/2011 FINDINGS: The heart is enlarged. Loop recorder device again seen. No pleural effusion or pneumothorax. Both lungs are clear. The visualized skeletal  structures are unremarkable. IMPRESSION: 1. No active cardiopulmonary disease. 2. Cardiomegaly. Electronically Signed   By: Greig Pique M.D.   On: 09/23/2024 20:30     Medical Consultants:   None.   Subjective:    Brittney Walker relates her shortness of breath is about the same.  Objective:    Vitals:   09/24/24 0500 09/24/24 0515 09/24/24 0530 09/24/24 0545  BP: (!) 142/62 (!) 135/58    Pulse: (!) 45 (!) 48 (!) 45 (!) 47  Resp:      Temp:      TempSrc:      SpO2: 96% 94% 97% 96%  Weight:      Height:       SpO2: 96 %  No intake or output data in the 24 hours ending 09/24/24 0600 Filed Weights   09/23/24 1951  Weight: 106.1 kg    Exam: General exam: In no acute distress. Respiratory system: Good air movement and clear to auscultation. Cardiovascular system: S1 & S2 heard, RRR.  Positive JVD Gastrointestinal system: Abdomen is nondistended, soft and nontender.  Extremities: 2+ woody edema Skin: No rashes, lesions or ulcers Psychiatry: Judgement and insight appear normal. Mood & affect appropriate.    Data Reviewed:    Labs: Basic Metabolic Panel: Recent Labs  Lab 09/23/24 2224 09/24/24 0351  NA 141 138  K 4.2 3.9  CL 102 103  CO2  --  24  GLUCOSE 91 108*  BUN 21 17  CREATININE 1.00 0.78  CALCIUM   --  9.3  MG  --  2.3  PHOS  --  3.2   GFR Estimated Creatinine Clearance: 76 mL/min (by C-G formula  based on SCr of 0.78 mg/dL). Liver Function Tests: Recent Labs  Lab 09/24/24 0351  AST 34  ALT 26  ALKPHOS 70  BILITOT 0.6  PROT 7.7  ALBUMIN 3.8   No results for input(s): LIPASE, AMYLASE in the last 168 hours. No results for input(s): AMMONIA in the last 168 hours. Coagulation profile Recent Labs  Lab 09/24/24 0351  INR 1.4*   COVID-19 Labs  No results for input(s): DDIMER, FERRITIN, LDH, CRP in the last 72 hours.  Lab Results  Component Value Date   SARSCOV2NAA NEGATIVE 09/23/2024   SARSCOV2NAA NEGATIVE 10/10/2021    SARSCOV2NAA NEGATIVE 09/04/2021   SARSCOV2NAA NEGATIVE 07/17/2021    CBC: Recent Labs  Lab 09/23/24 2152 09/23/24 2224  WBC 9.9  --   NEUTROABS 7.5  --   HGB 11.6* 11.9*  HCT 36.6 35.0*  MCV 93.6  --   PLT 171  --    Cardiac Enzymes: Recent Labs  Lab 09/24/24 0351  CKTOTAL 134   BNP (last 3 results) Recent Labs    08/17/24 1001 09/23/24 2152  PROBNP 225.0* 1,560.0*   CBG: No results for input(s): GLUCAP in the last 168 hours. D-Dimer: No results for input(s): DDIMER in the last 72 hours. Hgb A1c: No results for input(s): HGBA1C in the last 72 hours. Lipid Profile: No results for input(s): CHOL, HDL, LDLCALC, TRIG, CHOLHDL, LDLDIRECT in the last 72 hours. Thyroid  function studies: No results for input(s): TSH, T4TOTAL, T3FREE, THYROIDAB in the last 72 hours.  Invalid input(s): FREET3 Anemia work up: Recent Labs    09/24/24 0351  RETICCTPCT 1.4   Sepsis Labs: Recent Labs  Lab 09/23/24 2152  WBC 9.9   Microbiology Recent Results (from the past 240 hours)  Resp panel by RT-PCR (RSV, Flu A&B, Covid) Anterior Nasal Swab     Status: None   Collection Time: 09/23/24  7:59 PM   Specimen: Anterior Nasal Swab  Result Value Ref Range Status   SARS Coronavirus 2 by RT PCR NEGATIVE NEGATIVE Final    Comment: (NOTE) SARS-CoV-2 target nucleic acids are NOT DETECTED.  The SARS-CoV-2 RNA is generally detectable in upper respiratory specimens during the acute phase of infection. The lowest concentration of SARS-CoV-2 viral copies this assay can detect is 138 copies/mL. A negative result does not preclude SARS-Cov-2 infection and should not be used as the sole basis for treatment or other patient management decisions. A negative result may occur with  improper specimen collection/handling, submission of specimen other than nasopharyngeal swab, presence of viral mutation(s) within the areas targeted by this assay, and inadequate number  of viral copies(<138 copies/mL). A negative result must be combined with clinical observations, patient history, and epidemiological information. The expected result is Negative.  Fact Sheet for Patients:  bloggercourse.com  Fact Sheet for Healthcare Providers:  seriousbroker.it  This test is no t yet approved or cleared by the [place]  FDA and  has been authorized for detection and/or diagnosis of SARS-CoV-2 by FDA under an Emergency Use Authorization (EUA). This EUA will remain  in effect (meaning this test can be used) for the duration of the COVID-19 declaration under Section 564(b)(1) of the Act, 21 U.S.C.section 360bbb-3(b)(1), unless the authorization is terminated  or revoked sooner.       Influenza A by PCR NEGATIVE NEGATIVE Final   Influenza B by PCR NEGATIVE NEGATIVE Final    Comment: (NOTE) The Xpert Xpress SARS-CoV-2/FLU/RSV plus assay is intended as an aid in the diagnosis of influenza from  Nasopharyngeal swab specimens and should not be used as a sole basis for treatment. Nasal washings and aspirates are unacceptable for Xpert Xpress SARS-CoV-2/FLU/RSV testing.  Fact Sheet for Patients: bloggercourse.com  Fact Sheet for Healthcare Providers: seriousbroker.it  This test is not yet approved or cleared by the [place]  FDA and has been authorized for detection and/or diagnosis of SARS-CoV-2 by FDA under an Emergency Use Authorization (EUA). This EUA will remain in effect (meaning this test can be used) for the duration of the COVID-19 declaration under Section 564(b)(1) of the Act, 21 U.S.C. section 360bbb-3(b)(1), unless the authorization is terminated or revoked.     Resp Syncytial Virus by PCR NEGATIVE NEGATIVE Final    Comment: (NOTE) Fact Sheet for Patients: bloggercourse.com  Fact Sheet for Healthcare  Providers: seriousbroker.it  This test is not yet approved or cleared by the [place]  FDA and has been authorized for detection and/or diagnosis of SARS-CoV-2 by FDA under an Emergency Use Authorization (EUA). This EUA will remain in effect (meaning this test can be used) for the duration of the COVID-19 declaration under Section 564(b)(1) of the Act, 21 U.S.C. section 360bbb-3(b)(1), unless the authorization is terminated or revoked.  Performed at [facility], [address] [place], [place] [address]      Medications:    predniSONE   40 mg Oral Q breakfast   Continuous Infusions:    LOS: 0 days   Erle Odell Castor  [facility]  09/24/2024, 6:00 AM

## 2024-09-24 NOTE — Assessment & Plan Note (Signed)
 CTA showing no evidence of infiltrate but there is evidence of bronchomalacia and some airway inflammation which would be consistent with bronchitis Patient may benefit from a course of steroids  To see if that may help

## 2024-09-24 NOTE — Evaluation (Signed)
 Physical Therapy Evaluation Patient Details Name: Brittney Walker MRN: [id number] DOB: 19-Feb-1951 Today's Date: 09/24/2024  History of Present Illness  Brittney Walker is an 73 y.o. female comes in for complaints of lower extremity swelling, chronic cough and shortness of breath, she is also been complaining of orthopnea, proBNP of 1500. CT angio of the chest showed no evidence of PE, mild cardiomegaly right heart enlargement and reflux of contrast into the hepatic venous system. Neck MRI: Asymmetric and indistinct enhancement of the left thyroid , not well  evaluated by MRA.      PMH: paroxysmal atrial fibrillation on anticoagulation, Mobitz 1 AV block, right sided heart failure  Clinical Impression  Pt admitted with above diagnosis. PTA, pt ind with mobility, not using AD for in home ambulation, reliant on shopping cart while ambulating around grocery store, denies falls, reports lives in first floor apartment, has friend support. On eval, pt with good sensation throughout BLE, edema noted, pt reports tenderness to palpation throughout entire LE, not specific to calf region. Pt's AROM WFL. Pt powers to stand from elevated gurney slowly, BUE assisting, CGA for safety. Pt amb in room without AD, slightly unsteady, reaching for walls and furniture. Pt amb remainder of eval with RW, good steadiness, cadence functional, supv for safety. Mild dyspnea noted, pt continues conversation, on RA with SpO2 98%. Pt reports had a breathing treatment earlier this morning and she has noticed an improvement in her breathing since then. Recommend HHPT and may progress to no f/u; recommend rollator as pt reports she had one for distance ambulation but it was lost when she moved. Pt currently with functional limitations due to the deficits listed below (see PT Problem List). Pt will benefit from acute skilled PT to increase their independence and safety with mobility to allow discharge.           If plan is discharge  home, recommend the following:     Can travel by private vehicle        Equipment Recommendations Rollator (4 wheels)  Recommendations for Other Services       Functional Status Assessment Patient has had a recent decline in their functional status and demonstrates the ability to make significant improvements in function in a reasonable and predictable amount of time.     Precautions / Restrictions Precautions Precautions: Fall Recall of Precautions/Restrictions: Intact Restrictions Weight Bearing Restrictions Per Provider Order: No      Mobility  Bed Mobility Overal bed mobility: Needs Assistance Bed Mobility: Supine to Sit, Sit to Supine     Supine to sit: Supervision Sit to supine: Contact guard assist   General bed mobility comments: supv to come to sitting at bedside; CGA to lift BLE back into elevated gurney bed, increased time and effort    Transfers Overall transfer level: Needs assistance Equipment used: Rolling walker (2 wheels) Transfers: Sit to/from Stand Sit to Stand: Contact guard assist, From elevated surface           General transfer comment: CGA to power up from elevated gurney, good steadiness, slow to rise, BUE assisting to power up    Ambulation/Gait Ambulation/Gait assistance: Supervision, Contact guard assist Gait Distance (Feet): 200 Feet Assistive device: Rolling walker (2 wheels), None Gait Pattern/deviations: Step-through pattern, Decreased stride length Gait velocity: functional     General Gait Details: pt takes steps in room without AD, slightly unsteady reaching for walls and furniture, CGA for safety; provided RW and pt amb with RW for duration of  eval and supv for safety, able to clear past obstacles with slight increased time and steps  Stairs            Wheelchair Mobility     Tilt Bed    Modified Rankin (Stroke Patients Only)       Balance Overall balance assessment: Mild deficits observed, not formally  tested                                           Pertinent Vitals/Pain Pain Assessment Pain Assessment: No/denies pain    Home Living Family/patient expects to be discharged to:: Private residence Living Arrangements: Alone Available Help at Discharge: Friend(s);Available PRN/intermittently Type of Home: Apartment Home Access: Level entry       Home Layout: One level Home Equipment: Cane - single point Additional Comments: pt reports had rollator and BSC but they were lost in the move    Prior Function Prior Level of Function : Independent/Modified Independent;Driving             Mobility Comments: pt reports ind with in home ambulation, relies on shopping cart to with amb in store, denies falls in last 6 months ADLs Comments: pt reports ind with ADLs/IADLs     Extremity/Trunk Assessment   Upper Extremity Assessment Upper Extremity Assessment: Overall WFL for tasks assessed    Lower Extremity Assessment Lower Extremity Assessment: Overall WFL for tasks assessed (AROM WFL, strength grossly 3+/5, denies numbness/tingling, edema noted, somewhat tender to palpation)    Cervical / Trunk Assessment Cervical / Trunk Assessment: Normal  Communication   Communication Communication: No apparent difficulties    Cognition Arousal: Alert Behavior During Therapy: WFL for tasks assessed/performed   PT - Cognitive impairments: No apparent impairments                         Following commands: Intact       Cueing Cueing Techniques: Verbal cues     General Comments      Exercises     Assessment/Plan    PT Assessment Patient needs continued PT services  PT Problem List Decreased activity tolerance;Decreased balance;Cardiopulmonary status limiting activity       PT Treatment Interventions DME instruction;Gait training;Functional mobility training;Therapeutic activities;Therapeutic exercise;Balance training;Neuromuscular  re-education;Patient/family education    PT Goals (Current goals can be found in the Care Plan section)  Acute Rehab PT Goals Patient Stated Goal: return home, get a rollator PT Goal Formulation: With patient Time For Goal Achievement: 10/08/24 Potential to Achieve Goals: Good    Frequency Min 2X/week     Co-evaluation               AM-PAC PT 6 Clicks Mobility  Outcome Measure Help needed turning from your back to your side while in a flat bed without using bedrails?: A Little Help needed moving from lying on your back to sitting on the side of a flat bed without using bedrails?: A Little Help needed moving to and from a bed to a chair (including a wheelchair)?: A Little Help needed standing up from a chair using your arms (e.g., wheelchair or bedside chair)?: A Little Help needed to walk in hospital room?: A Little Help needed climbing 3-5 steps with a railing? : A Lot 6 Click Score: 17    End of Session Equipment Utilized During Treatment: Gait belt Activity  Tolerance: Patient tolerated treatment well Patient left: in bed;with call bell/phone within reach Nurse Communication: Mobility status;Other (comment) (pt awaiting lunch tray) PT Visit Diagnosis: Other abnormalities of gait and mobility (R26.89)    Time: 8844-8771 PT Time Calculation (min) (ACUTE ONLY): 33 min   Charges:   PT Evaluation $PT Eval Low Complexity: 1 Low PT Treatments $Gait Training: 8-22 mins PT General Charges $$ ACUTE PT VISIT: 1 Visit         Tori Lorien Shingler PT, DPT 09/24/24, 1:47 PM

## 2024-09-25 ENCOUNTER — Observation Stay (HOSPITAL_COMMUNITY)

## 2024-09-25 DIAGNOSIS — I5033 Acute on chronic diastolic (congestive) heart failure: Secondary | ICD-10-CM | POA: Diagnosis not present

## 2024-09-25 LAB — T3: T3, Total: 105 ng/dL (ref 71–180)

## 2024-09-25 MED ORDER — IOHEXOL 300 MG/ML  SOLN
80.0000 mL | Freq: Once | INTRAMUSCULAR | Status: AC | PRN
  Administered 2024-09-25: 80 mL via INTRAVENOUS

## 2024-09-25 MED ORDER — SODIUM CHLORIDE 0.9 % IV SOLN: INTRAVENOUS | Status: DC

## 2024-09-25 NOTE — Progress Notes (Signed)
 IP CM acknowledges consult for [facility] screen; orders for HF Navigation Team previously placed; clearing consult.

## 2024-09-25 NOTE — Progress Notes (Signed)
 [facility] PROGRESS NOTE    Progress Note  Brittney Walker  FMW:[id number] DOB: April 23, 1951 DOA: 09/23/2024 PCP: Amon Aloysius BRAVO, MD     Brief Narrative:   Brittney Walker is an 73 y.o. female past medical history significant for paroxysmal atrial fibrillation on anticoagulation, Mobitz 1 AV block, right sided heart failure comes in for complaints of lower extremity swelling, chronic cough and shortness of breath, she is also been complaining of orthopnea, proBNP of 1500 CT angio of the chest showed no evidence of PE mild cardiomegaly right heart enlargement and reflux of contrast into the hepatic venous system.  It also showed infiltrative changes surrounding the esophagus and carotid vasculature at the thoracic inlet with poor delineation of the thyroid  with extension into the deep soft tissue thoracic inlet  Assessment/Plan:   Chronic diastolic CHF (congestive heart failure)/pulmonary hypertension: She has elevated JVD, but she relates she is not short of breath. Unlikely heart failure to explain all the picture, she has persistent cough with this new mass in the neck see below for further details. Continue IV Lasix . Continue strict I's and O's and daily weights. Out of bed to chair consult physical therapy. 2D echo showed an EF of 55% no regional wall motion abnormalities diastolic parameters were indeterminate.  Infiltrative changes surrounding the esophagus and carotid vasculature: MRI of the neck inconclusive. Check a CT head and neck with contrast.  Hypothyroidism Continue Synthroid.  Paroxysmal atrial fibrillation with slow ventricular rate: Rate controlled, change Eliquis  to Lovenox  in case procedure is needed. She went as low as 30 yesterday.    DVT prophylaxis: Lovenox  Family Communication:none Status is: Observation The patient remains OBS appropriate and will d/c before 2 midnights.    Code Status:     Code Status Orders  (From admission, onward)            Start     Ordered   09/24/24 0040  Full code  Continuous       Question:  By:  Answer:  Consent: discussion documented in EHR   09/24/24 0040           Code Status History     Date Active Date Inactive Code Status Order ID Comments User Context   10/10/2021 2014 10/17/2021 2252 Full Code [id number]  Vivienne Lonni Ingle, NP Inpatient         IV Access:   Peripheral IV   Procedures and diagnostic studies:   ECHOCARDIOGRAM COMPLETE Result Date: 09/24/2024    ECHOCARDIOGRAM REPORT   Patient Name:   Brittney Walker Date of Exam: 09/24/2024 Medical Rec #:  [id number]        Height:       66.0 in Accession #:    [id number]       Weight:       234.0 lb Date of Birth:  1950/12/04        BSA:          2.138 m Patient Age:    73 years         BP:           130/59 mmHg Patient Gender: F                HR:           54 bpm. Exam Location:  Inpatient Procedure: 2D Echo, Cardiac Doppler and Color Doppler (Both Spectral and Color            Flow Doppler were  utilized during procedure). Indications:    CHF-Acute Diastolic I50.31  History:        Patient has prior history of Echocardiogram examinations, most                 recent 03/04/2023. CHF.  Sonographer:    Jayson Gaskins Referring Phys: [id number] ANASTASSIA DOUTOVA IMPRESSIONS  1. Left ventricular ejection fraction, by estimation, is 55 to 60%. The left ventricle has normal function. The left ventricle has no regional wall motion abnormalities. Left ventricular diastolic parameters are indeterminate.  2. Right ventricular systolic function was not well visualized. The right ventricular size is not well visualized.  3. Left atrial size was moderately dilated.  4. Right atrial size was moderately dilated.  5. The mitral valve is abnormal. Mild mitral valve regurgitation. No evidence of mitral stenosis.  6. Tricuspid valve regurgitation is mild to moderate.  7. The aortic valve is tricuspid. There is mild calcification of the aortic valve.  There is mild thickening of the aortic valve. Aortic valve regurgitation is not visualized. Aortic valve sclerosis is present, with no evidence of aortic valve stenosis.  8. The inferior vena cava is dilated in size with >50% respiratory variability, suggesting right atrial pressure of 8 mmHg. FINDINGS  Left Ventricle: Left ventricular ejection fraction, by estimation, is 55 to 60%. The left ventricle has normal function. The left ventricle has no regional wall motion abnormalities. Strain was performed and the global longitudinal strain is indeterminate. The left ventricular internal cavity size was normal in size. There is no left ventricular hypertrophy. Left ventricular diastolic parameters are indeterminate. Right Ventricle: The right ventricular size is not well visualized. No increase in right ventricular wall thickness. Right ventricular systolic function was not well visualized. Left Atrium: Left atrial size was moderately dilated. Right Atrium: Right atrial size was moderately dilated. Pericardium: There is no evidence of pericardial effusion. Mitral Valve: The mitral valve is abnormal. There is mild thickening of the mitral valve leaflet(s). There is mild calcification of the mitral valve leaflet(s). Mild mitral valve regurgitation. No evidence of mitral valve stenosis. Tricuspid Valve: The tricuspid valve is normal in structure. Tricuspid valve regurgitation is mild to moderate. No evidence of tricuspid stenosis. Aortic Valve: The aortic valve is tricuspid. There is mild calcification of the aortic valve. There is mild thickening of the aortic valve. Aortic valve regurgitation is not visualized. Aortic valve sclerosis is present, with no evidence of aortic valve stenosis. Aortic valve mean gradient measures 7.0 mmHg. Aortic valve peak gradient measures 10.4 mmHg. Aortic valve area, by VTI measures 2.13 cm. Pulmonic Valve: The pulmonic valve was normal in structure. Pulmonic valve regurgitation is not  visualized. No evidence of pulmonic stenosis. Aorta: The aortic root is normal in size and structure. Venous: The inferior vena cava is dilated in size with greater than 50% respiratory variability, suggesting right atrial pressure of 8 mmHg. IAS/Shunts: No atrial level shunt detected by color flow Doppler. Additional Comments: 3D was performed not requiring image post processing on an independent workstation and was indeterminate.  LEFT VENTRICLE PLAX 2D LVIDd:         5.00 cm LVIDs:         3.20 cm LV PW:         1.00 cm LV IVS:        1.10 cm LVOT diam:     1.80 cm LV SV:         79 LV SV Index:   37 LVOT Area:  2.54 cm  RIGHT VENTRICLE            IVC RV S prime:     9.68 cm/s  IVC diam: 3.00 cm TAPSE (M-mode): 1.9 cm LEFT ATRIUM             Index LA Vol (A2C):   57.5 ml 26.90 ml/m LA Vol (A4C):   76.3 ml 35.69 ml/m LA Biplane Vol: 69.7 ml 32.60 ml/m  AORTIC VALVE AV Area (Vmax):    2.10 cm AV Area (Vmean):   1.83 cm AV Area (VTI):     2.13 cm AV Vmax:           161.00 cm/s AV Vmean:          124.000 cm/s AV VTI:            0.369 m AV Peak Grad:      10.4 mmHg AV Mean Grad:      7.0 mmHg LVOT Vmax:         133.00 cm/s LVOT Vmean:        89.100 cm/s LVOT VTI:          0.309 m LVOT/AV VTI ratio: 0.84  AORTA Ao Root diam: 2.90 cm MITRAL VALVE                TRICUSPID VALVE MV Area (PHT): 3.86 cm     TR Peak grad:   44.9 mmHg MV E velocity: 109.00 cm/s  TR Vmax:        335.00 cm/s                              SHUNTS                             Systemic VTI:  0.31 m                             Systemic Diam: 1.80 cm Maude Emmer MD Electronically signed by Maude Emmer MD Signature Date/Time: 09/24/2024/3:26:54 PM    Final    MR ANGIO NECK W WO CONTRAST Result Date: 09/24/2024 EXAM: MRA Neck without and with contrast 09/24/2024 08:37:14 AM TECHNIQUE: Multiplanar multisequence MRA of the neck was performed without and with the administration of 10 mL gadobutrol  (GADAVIST ) 1 MMOL/ML injection. 2D and 3D  reformatted images are provided for review. Stenosis of the internal carotid arteries is measured using NASCET criteria. COMPARISON: CTA chest dated 09/23/2024. CLINICAL HISTORY: 73 year old female with acute neck pain, no red flags. Abnormal thoracic inlet soft tissues on prior CTA chest. FINDINGS: OTHER: Asymmetric and indistinct enhancement of the left thyroid , which is otherwise not well evaluated on this exam (series 21 image 48). AORTIC ARCH: 3 vessel aortic arch configuration. CAROTID ARTERIES: Precontrast time of flight MR angiogram images reveal antegrade fluid in the bilateral cervical carotid arteries. Carotid bifurcations appear patent and within normal limits. Postcontrast neck MR angiogram images demonstrate common carotid arteries, carotid bifurcations, cervical internal carotid arteries, and visible ICA siphons appear patent without evidence of stenosis. No dissection. No hemodynamically significant stenosis by NASCET criteria. VERTEBRAL ARTERIES: Precontrast time of flight MR angiogram images reveal antegrade fluid in the bilateral cervical vertebral arteries. The left vertebral artery appears mildly dominant. Postcontrast neck MR angiogram images demonstrate both vertebral arteries appear patent to the vertebrobasilar junction without evidence of hemodynamically significant  stenosis. Visible basilar artery is patent. No dissection. OTHER: The findings are negative for age-related significant pathology. IMPRESSION: 1. Asymmetric and indistinct enhancement of the left thyroid , not well evaluated by MRA. Dedicated Neck CT with IV contrast is the preferred first line imaging for evaluation of possible abnormal thyroid  and soft tissue at the thoracic inlet. MRI neck without and with contrast is less preferred. 2. Negative-for-age MRI angiogram of the neck. Electronically signed by: Helayne Hurst MD 09/24/2024 08:52 AM EST RP Workstation: [id number]   CT Angio Chest PE W/Cm &/Or Wo Cm Result Date:  09/23/2024 EXAM: CTA CHEST AORTA 09/23/2024 11:08:19 PM TECHNIQUE: CTA of the chest was performed after the administration of intravenous contrast. Multiplanar reformatted images are provided for review. MIP images are provided for review. Automated exposure control, iterative reconstruction, and/or weight based adjustment of the mA/kV was utilized to reduce the radiation dose to as low as reasonably achievable. COMPARISON: None available. CLINICAL HISTORY: Pulmonary embolism (PE) suspected, high prob. FINDINGS: AORTA: Mild atherosclerotic calcification within the thoracic aorta. No aortic aneurysm. No thoracic aortic dissection. No intramural hematoma. No aortic ulceration. No rupture. MEDIASTINUM: Moderate multivessel coronary artery calcification. Mild-to-moderate cardiomegaly with right ventricular and right atrial enlargement. There is reflux with contrast at the hepatic venous system in keeping with at least some degree of right heart failure. No pericardial effusion. Central pulmonary arteries are of normal caliber. No pulmonary embolism. LYMPH NODES: No mediastinal, hilar or axillary lymphadenopathy. LUNGS AND PLEURA: Bibasilar atelectasis. Segmental air trapping within the medial and anterior segment of the right lower lobe, possibly related to small airways disease or bronchial atresia given its isolated nature. Bronchomalacia involving the bronchus intermedius best seen on image 57/12 with collapse on this expiratory phase examination. Bronchial wall thickening noted in keeping with airway inflammation. No focal consolidation or pulmonary edema. No pleural effusion or pneumothorax. UPPER ABDOMEN: Cholelithiasis partially visualized. SOFT TISSUES AND BONES: There is relatively poor delineation of the thyroid  gland with infiltrative changes surrounding the esophagus and carotid vasculature at the thoracic inlet. This appearance may be in part related to low-dose imaging and bolus timing for this radicular  study, however, an infiltrative process such as anaplastic thyroid  cancer, or changes of prior radiation therapy could appear this fashion. Esophagus otherwise unremarkable. Osseous structures are age appropriate. No acute bone abnormality. No lytic or blastic bone lesion. IMPRESSION: 1. No evidence of pulmonary embolism. 2. Mild-to-moderate cardiomegaly with right heart enlargement and reflux of contrast into the hepatic venous system, consistent with right heart failure. 3. Infiltrative changes surrounding the esophagus and carotid vasculature at the thoracic inlet with poor delineation of the thyroid ; given the possibility of an infiltrative thyroid  process with extension into the deep soft tissues of the thoracic inlet, neck CT or MRI with contrast is recommended for further evaluation. 4. Bronchoalacia involving the bronchus intermedius with collapse on this expiratory phase examination. Bronchial wall thickening in keeping with airway inflammation. Electronically signed by: Dorethia Molt MD 09/23/2024 11:32 PM EST RP Workstation: [id number]   DG Chest 2 View Result Date: 09/23/2024 CLINICAL DATA:  Cough and shortness of breath EXAM: CHEST - 2 VIEW COMPARISON:  Chest x-ray 10/12/2011 FINDINGS: The heart is enlarged. Loop recorder device again seen. No pleural effusion or pneumothorax. Both lungs are clear. The visualized skeletal structures are unremarkable. IMPRESSION: 1. No active cardiopulmonary disease. 2. Cardiomegaly. Electronically Signed   By: Greig Pique M.D.   On: 09/23/2024 20:30     Medical Consultants:   None.  Subjective:    Brittney Walker continues to have persistent cough.  Objective:    Vitals:   09/24/24 2100 09/25/24 0100 09/25/24 0508 09/25/24 0600  BP:  (!) 132/58 (!) 111/51   Pulse:  (!) 49 (!) 45   Resp:  18 18   Temp:  97.9 F (36.6 C) 97.9 F (36.6 C)   TempSrc:  Oral Oral   SpO2:  98% 97%   Weight: 107.3 kg   106.7 kg  Height: 5' 6.5 (1.689 m)       SpO2: 97 %   Intake/Output Summary (Last 24 hours) at 09/25/2024 0804 Last data filed at 09/25/2024 9278 Gross per 24 hour  Intake 720 ml  Output 850 ml  Net -130 ml   Filed Weights   09/23/24 1951 09/24/24 2100 09/25/24 0600  Weight: 106.1 kg 107.3 kg 106.7 kg    Exam: General exam: In no acute distress. Respiratory system: Good air movement and clear to auscultation. Cardiovascular system: S1 & S2 heard, RRR. No JVD. Gastrointestinal system: Abdomen is nondistended, soft and nontender.  Extremities: No pedal edema. Skin: No rashes, lesions or ulcers Psychiatry: Judgement and insight appear normal. Mood & affect appropriate.  Data Reviewed:    Labs: Basic Metabolic Panel: Recent Labs  Lab 09/23/24 2224 09/24/24 0351 09/24/24 1355  NA 141 138 140  K 4.2 3.9 3.8  CL 102 103 103  CO2  --  24 27  GLUCOSE 91 108* 162*  BUN 21 17 18   CREATININE 1.00 0.78 0.85  CALCIUM   --  9.3 9.5  MG  --  2.3 2.2  PHOS  --  3.2 3.1   GFR Estimated Creatinine Clearance: 72.4 mL/min (by C-G formula based on SCr of 0.85 mg/dL). Liver Function Tests: Recent Labs  Lab 09/24/24 0351 09/24/24 1355  AST 34 32  ALT 26 32  ALKPHOS 70 71  BILITOT 0.6 0.7  PROT 7.7 7.6  ALBUMIN 3.8 4.0   No results for input(s): LIPASE, AMYLASE in the last 168 hours. No results for input(s): AMMONIA in the last 168 hours. Coagulation profile Recent Labs  Lab 09/24/24 0351  INR 1.4*   COVID-19 Labs  Recent Labs    09/24/24 0548  FERRITIN 217    Lab Results  Component Value Date   SARSCOV2NAA NEGATIVE 09/23/2024   SARSCOV2NAA NEGATIVE 10/10/2021   SARSCOV2NAA NEGATIVE 09/04/2021   SARSCOV2NAA NEGATIVE 07/17/2021    CBC: Recent Labs  Lab 09/23/24 2152 09/23/24 2224 09/24/24 1355  WBC 9.9  --  8.9  NEUTROABS 7.5  --   --   HGB 11.6* 11.9* 11.2*  HCT 36.6 35.0* 34.5*  MCV 93.6  --  93.0  PLT 171  --  158   Cardiac Enzymes: Recent Labs  Lab 09/24/24 0351  CKTOTAL  134   BNP (last 3 results) Recent Labs    08/17/24 1001 09/23/24 2152  PROBNP 225.0* 1,560.0*   CBG: No results for input(s): GLUCAP in the last 168 hours. D-Dimer: No results for input(s): DDIMER in the last 72 hours. Hgb A1c: No results for input(s): HGBA1C in the last 72 hours. Lipid Profile: No results for input(s): CHOL, HDL, LDLCALC, TRIG, CHOLHDL, LDLDIRECT in the last 72 hours. Thyroid  function studies: Recent Labs    09/24/24 0548  TSH 1.950   Anemia work up: Recent Labs    09/24/24 0351 09/24/24 0548  VITAMINB12  --  1,109*  FOLATE  --  15.0  FERRITIN  --  217  TIBC  --  332  IRON  --  26*  RETICCTPCT 1.4  --    Sepsis Labs: Recent Labs  Lab 09/23/24 2152 09/24/24 1355  WBC 9.9 8.9   Microbiology Recent Results (from the past 240 hours)  Resp panel by RT-PCR (RSV, Flu A&B, Covid) Anterior Nasal Swab     Status: None   Collection Time: 09/23/24  7:59 PM   Specimen: Anterior Nasal Swab  Result Value Ref Range Status   SARS Coronavirus 2 by RT PCR NEGATIVE NEGATIVE Final    Comment: (NOTE) SARS-CoV-2 target nucleic acids are NOT DETECTED.  The SARS-CoV-2 RNA is generally detectable in upper respiratory specimens during the acute phase of infection. The lowest concentration of SARS-CoV-2 viral copies this assay can detect is 138 copies/mL. A negative result does not preclude SARS-Cov-2 infection and should not be used as the sole basis for treatment or other patient management decisions. A negative result may occur with  improper specimen collection/handling, submission of specimen other than nasopharyngeal swab, presence of viral mutation(s) within the areas targeted by this assay, and inadequate number of viral copies(<138 copies/mL). A negative result must be combined with clinical observations, patient history, and epidemiological information. The expected result is Negative.  Fact Sheet for Patients:   bloggercourse.com  Fact Sheet for Healthcare Providers:  seriousbroker.it  This test is no t yet approved or cleared by the [place]  FDA and  has been authorized for detection and/or diagnosis of SARS-CoV-2 by FDA under an Emergency Use Authorization (EUA). This EUA will remain  in effect (meaning this test can be used) for the duration of the COVID-19 declaration under Section 564(b)(1) of the Act, 21 U.S.C.section 360bbb-3(b)(1), unless the authorization is terminated  or revoked sooner.       Influenza A by PCR NEGATIVE NEGATIVE Final   Influenza B by PCR NEGATIVE NEGATIVE Final    Comment: (NOTE) The Xpert Xpress SARS-CoV-2/FLU/RSV plus assay is intended as an aid in the diagnosis of influenza from Nasopharyngeal swab specimens and should not be used as a sole basis for treatment. Nasal washings and aspirates are unacceptable for Xpert Xpress SARS-CoV-2/FLU/RSV testing.  Fact Sheet for Patients: bloggercourse.com  Fact Sheet for Healthcare Providers: seriousbroker.it  This test is not yet approved or cleared by the [place]  FDA and has been authorized for detection and/or diagnosis of SARS-CoV-2 by FDA under an Emergency Use Authorization (EUA). This EUA will remain in effect (meaning this test can be used) for the duration of the COVID-19 declaration under Section 564(b)(1) of the Act, 21 U.S.C. section 360bbb-3(b)(1), unless the authorization is terminated or revoked.     Resp Syncytial Virus by PCR NEGATIVE NEGATIVE Final    Comment: (NOTE) Fact Sheet for Patients: bloggercourse.com  Fact Sheet for Healthcare Providers: seriousbroker.it  This test is not yet approved or cleared by the [place]  FDA and has been authorized for detection and/or diagnosis of SARS-CoV-2 by FDA under an Emergency Use  Authorization (EUA). This EUA will remain in effect (meaning this test can be used) for the duration of the COVID-19 declaration under Section 564(b)(1) of the Act, 21 U.S.C. section 360bbb-3(b)(1), unless the authorization is terminated or revoked.  Performed at [facility], [address] [place], [place] [address]      Medications:    enoxaparin  (LOVENOX ) injection  100 mg Subcutaneous Q12H   ezetimibe   10 mg Oral Daily   furosemide   40 mg Intravenous Daily   predniSONE   40 mg Oral Q breakfast   rosuvastatin   40 mg Oral QHS   sodium chloride  flush  3 mL Intravenous Q12H   Continuous Infusions:  sodium chloride         LOS: 0 days   Erle Odell Castor  [facility]  09/25/2024, 8:04 AM

## 2024-09-25 NOTE — Care Management Obs Status (Signed)
 MEDICARE OBSERVATION STATUS NOTIFICATION   Patient Details  Name: BURNIE HANK MRN: [id number] Date of Birth: 08/04/1951   Medicare Observation Status Notification Given:  Yes    Sonda Manuella Quill, RN 09/25/2024, 4:57 PM

## 2024-09-26 DIAGNOSIS — I441 Atrioventricular block, second degree: Secondary | ICD-10-CM | POA: Diagnosis not present

## 2024-09-26 DIAGNOSIS — I5033 Acute on chronic diastolic (congestive) heart failure: Secondary | ICD-10-CM | POA: Diagnosis not present

## 2024-09-26 DIAGNOSIS — I50812 Chronic right heart failure: Secondary | ICD-10-CM

## 2024-09-26 DIAGNOSIS — I4819 Other persistent atrial fibrillation: Secondary | ICD-10-CM

## 2024-09-26 DIAGNOSIS — E782 Mixed hyperlipidemia: Secondary | ICD-10-CM

## 2024-09-26 DIAGNOSIS — D509 Iron deficiency anemia, unspecified: Secondary | ICD-10-CM | POA: Diagnosis not present

## 2024-09-26 LAB — BASIC METABOLIC PANEL WITH GFR
Anion gap: 10 (ref 5–15)
BUN: 19 mg/dL (ref 8–23)
CO2: 29 mmol/L (ref 22–32)
Calcium: 9.8 mg/dL (ref 8.9–10.3)
Chloride: 99 mmol/L (ref 98–111)
Creatinine, Ser: 0.85 mg/dL (ref 0.44–1.00)
GFR, Estimated: >60 mL/min (ref >60)
Glucose, Bld: 166 mg/dL — ABNORMAL HIGH (ref 70–99)
Potassium: 3.7 mmol/L (ref 3.5–5.1)
Sodium: 138 mmol/L (ref 135–145)

## 2024-09-26 MED ORDER — FUROSEMIDE 10 MG/ML IJ SOLN
40.0000 mg | Freq: Two times a day (BID) | INTRAMUSCULAR | Status: DC
  Administered 2024-09-26: 40 mg via INTRAVENOUS
  Filled 2024-09-26: qty 4

## 2024-09-26 NOTE — Consult Note (Signed)
 Cardiology Consultation   Patient ID: Brittney Walker MRN: [id number]; DOB: 1951/10/26  Admit date: 09/23/2024 Date of Consult: 09/26/2024  PCP:  Amon Aloysius BRAVO, MD   [facility] Providers Cardiologist:  Jerel Balding, MD  Electrophysiologist:  Eulas BRAVO [place], MD      CC: cough Reason of Consult: Atrial fibrillation  Patient Profile: Brittney Walker is a 73 y.o. female with a hx of persistent atrial fibrillation, history of direct-current cardioversion December 2022, history of anemia with large gastric ulcer, who is being seen 09/26/2024 for the evaluation of atrial fibrillation/cough/orthopnea at the request of Odell Celinda Balo, MD.    History of Present Illness: [name] with a past medical history as mentioned above presents to the hospital with a chief complaint of cough.  Review of prior cardiology notes she had an echocardiogram in the past which noted dilated right heart chambers without evidence of left-to-right shunt, severe tricuspid regurgitation.  Patient's been treated with medical therapy.  Given her persistent A-fib and underlying conduction disease she has been evaluated by EP who was felt that her atrial fibrillation is likely secondary to a nonpulmonary vein trigger given her right heart disease.  If antiarrhythmics should be considered in the future dofetilide was recommended.  Given her underlying second-degree type I AV block her overall A-fib burden has been difficult to assess on her loop recorder implant.  She was last seen in the office in April 2025 by my partner.  She presents to the hospital with a chief complaint of coughing.  Workup in the ER notes negative COVID and flu panels. Chest x-ray does not show concerns for active pulmonary vascular congestion. CT angio performed noted no evidence of pulmonary embolism, sequelae of right heart failure, infiltrative changes surrounding the esophagus and carotid vasculature at the thoracic  inlet with poor delineation of the thyroid  given the possibility of infiltrative thyroid  process with extension into the deep soft tissue of the thoracic inlet as well as Bronchoalacia.  MRI neck with and without contrast also performed and mentioned below.   Cardiology consulted due to concerns that her symptoms of orthopnea and cough are secondary to slow ventricular rate.  Patient denies anginal chest pain. Patient states that her lower extremity swelling has improved.  She has chronic 2-3 pillow orthopnea, denies PND. Patient states that her URI/bronchitis symptoms probably exacerbated her underlying heart failure leading to the hospitalization. Patient states she is likely back to baseline. Has undergone multiple imaging studies with and without contrast during this hospitalization.   Patient has not been evaluated for sleep apnea but understands its importance.  Past Medical History:  Diagnosis Date   Atrial fibrillation (HCC) 10/04/2021   Carotid bruit    w/ neg dopplers    CHF (congestive heart failure) (HCC)    History of gastric ulcer 10/04/2021   Hyperthyroidism     s/p RAI 2004   Normocytic anemia    Persistent atrial fibrillation (HCC)    a. Dx 07/2021; b. CHA2DS2VASc = 3-->Eliquis ; c. 10/2021 s/p DCCV.   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   Vitamin D  deficiency     Past Surgical History:  Procedure Laterality Date   ABDOMINAL HYSTERECTOMY  11/05/1995   TAH-LEIOMYOMATA-- no BSO   BIOPSY  12/25/2021   Procedure: BIOPSY;  Surgeon: Legrand Victory LITTIE DOUGLAS, MD;  Location: [facility];  Service: Gastroenterology;;   BIOPSY THYROID       bx of largest nodule 2005, benign   BREAST SURGERY  x's 2--fibrocystic dz   CARDIOVERSION N/A 10/10/2021   Procedure: CARDIOVERSION;  Surgeon: Francyne Headland, MD;  Location: [facility];  Service: Cardiovascular;  Laterality: N/A;   CARDIOVERSION N/A 08/23/2022   Procedure: CARDIOVERSION;  Surgeon: Loni Soyla LABOR,  MD;  Location: [facility] ENDOSCOPY;  Service: Cardiovascular;  Laterality: N/A;   CATARACT EXTRACTION, BILATERAL Bilateral    COLONOSCOPY WITH PROPOFOL  N/A 12/25/2021   Procedure: COLONOSCOPY WITH PROPOFOL ;  Surgeon: Legrand Victory LITTIE DOUGLAS, MD;  Location: [facility];  Service: Gastroenterology;  Laterality: N/A;   ESOPHAGOGASTRODUODENOSCOPY (EGD) WITH PROPOFOL  N/A 10/12/2021   Procedure: ESOPHAGOGASTRODUODENOSCOPY (EGD) WITH PROPOFOL ;  Surgeon: Legrand Victory LITTIE DOUGLAS, MD;  Location: [facility] ENDOSCOPY;  Service: Gastroenterology;  Laterality: N/A;   ESOPHAGOGASTRODUODENOSCOPY (EGD) WITH PROPOFOL  N/A 12/25/2021   Procedure: ESOPHAGOGASTRODUODENOSCOPY (EGD) WITH PROPOFOL ;  Surgeon: Legrand Victory LITTIE DOUGLAS, MD;  Location: [facility];  Service: Gastroenterology;  Laterality: N/A;   POLYPECTOMY  12/25/2021   Procedure: POLYPECTOMY;  Surgeon: Legrand Victory LITTIE DOUGLAS, MD;  Location: [facility]OS[facility];  Service: Gastroenterology;;   TONSILLECTOMY         Scheduled Meds:  enoxaparin  (LOVENOX ) injection  100 mg Subcutaneous Q12H   ezetimibe   10 mg Oral Daily   furosemide   40 mg Intravenous Q12H   predniSONE   40 mg Oral Q breakfast   rosuvastatin   40 mg Oral QHS   sodium chloride  flush  3 mL Intravenous Q12H   Continuous Infusions:  PRN Meds: acetaminophen  **OR** acetaminophen , albuterol , HYDROcodone -acetaminophen , ondansetron  **OR** ondansetron  (ZOFRAN ) IV, sodium chloride  flush  Allergies:   No Known Allergies  Social History:   Social History   Socioeconomic History   Marital status: Single    Spouse name: Not on file   Number of children: 0   Years of education: Not on file   Highest education level: Not on file  Occupational History   Occupation: retired---collections  Tobacco Use   Smoking status: Never   Smokeless tobacco: Never   Tobacco comments:    Never smoke 08/15/22  Vaping Use   Vaping status: Never Used  Substance and Sexual Activity   Alcohol use: No    Alcohol/week: 0.0 standard drinks of  alcohol   Drug use: No   Sexual activity: Never    Comment: Virgin  Other Topics Concern   Not on file  Social History Narrative   Lives by herself    Social Drivers of Health   Financial Resource Strain: Low Risk  (08/17/2024)   Overall Financial Resource Strain (CARDIA)    Difficulty of Paying Living Expenses: Not hard at all  Food Insecurity: No Food Insecurity (09/24/2024)   Hunger Vital Sign    Worried About Radiation Protection Practitioner of Food in the Last Year: Never true    Ran Out of Food in the Last Year: Never true  Transportation Needs: No Transportation Needs (09/24/2024)   PRAPARE - Administrator, Civil Service (Medical): No    Lack of Transportation (Non-Medical): No  Physical Activity: Inactive (08/17/2024)   Exercise Vital Sign    Days of Exercise per Week: 0 days    Minutes of Exercise per Session: 0 min  Stress: No Stress Concern Present (08/17/2024)   [employer] of Occupational Health - Occupational Stress Questionnaire    Feeling of Stress: Not at all  Social Connections: Moderately Integrated (09/24/2024)   Social Connection and Isolation Panel    Frequency of Communication with Friends and Family: More than three times a week    Frequency  of Social Gatherings with Friends and Family: More than three times a week    Attends Religious Services: More than 4 times per year    Active Member of [employer] or Organizations: Yes    Attends Engineer, Structural: More than 4 times per year    Marital Status: Never married  Intimate Partner Violence: Not At Risk (09/24/2024)   Humiliation, Afraid, Rape, and Kick questionnaire    Fear of Current or Ex-Partner: No    Emotionally Abused: No    Physically Abused: No    Sexually Abused: No    Family History:   Family History  Problem Relation Age of Onset   Seizures Father        Epilepsy   Hypertension Father    Heart attack Father        ?   Breast cancer Sister 82   Heart disease Mother     Hypertension Mother    Colon cancer Neg Hx    Pancreatic cancer Neg Hx    Esophageal cancer Neg Hx    Prostate cancer Neg Hx    Rectal cancer Neg Hx      ROS:  Review of Systems  Cardiovascular:  Positive for leg swelling and orthopnea (chronically uses 2-3 pillows). Negative for chest pain, claudication, irregular heartbeat, near-syncope, palpitations, paroxysmal nocturnal dyspnea and syncope.  Respiratory:  Positive for cough (improved), sputum production and wheezing. Negative for shortness of breath.   Hematologic/Lymphatic: Negative for bleeding problem.  Musculoskeletal:        Injury to the left palm    Physical Exam/Data: Vitals:   09/25/24 1030 09/25/24 1345 09/26/24 0340 09/26/24 0500  BP: (!) 130/59 (!) 107/54 118/60   Pulse: (!) 43 (!) 42 62   Resp: 18 18 19    Temp:  98.4 F (36.9 C) 98.7 F (37.1 C)   TempSrc:  Oral Oral   SpO2: 98% 97% 97%   Weight:    107.2 kg  Height:        Intake/Output Summary (Last 24 hours) at 09/26/2024 1056 Last data filed at 09/26/2024 0900 Gross per 24 hour  Intake 723 ml  Output 2150 ml  Net -1427 ml      09/26/2024    5:00 AM 09/25/2024    6:00 AM 09/24/2024    9:00 PM  Last 3 Weights  Weight (lbs) 236 lb 5.3 oz 235 lb 3.7 oz 236 lb 9.6 oz  Weight (kg) 107.2 kg 106.7 kg 107.321 kg     Body mass index is 37.57 kg/m.  General:  Well nourished, well developed, in no acute distress HEENT: normal Neck: + JVD Vascular: No carotid bruits; Distal pulses 2+ bilaterally Cardiac:  irregularly irregular, bradycardia, S1, S2; ; no m/g/r  Lungs:  clear to auscultation bilaterally, no wheezing, rhonchi or rales  Abd: soft, nontender, no hepatomegaly  Ext: +1 edema bilateral, warm  Musculoskeletal:  No deformities, BUE and BLE strength normal and equal Skin: warm and dry  Neuro:  CNs 2-12 intact, no focal abnormalities noted Psych:  Normal affect   EKG:  The EKG was personally reviewed and demonstrates:   09/24/2024: afib with   slow ventricular response, low voltage, and no ischemia or injury pattern.  Telemetry:  Telemetry was personally reviewed and demonstrates:  Atrial fibrillation with slow ventricular rate as well as sinus rhythm with second-degree type I AV block  Relevant CV Studies: Echocardiogram September 24, 2024 1. Left ventricular ejection fraction, by estimation, is 55  to 60%. The  left ventricle has normal function. The left ventricle has no regional  wall motion abnormalities. Left ventricular diastolic parameters are  indeterminate.   2. Right ventricular systolic function was not well visualized. The right  ventricular size is not well visualized.   3. Left atrial size was moderately dilated.   4. Right atrial size was moderately dilated.   5. The mitral valve is abnormal. Mild mitral valve regurgitation. No  evidence of mitral stenosis.   6. Tricuspid valve regurgitation is mild to moderate.   7. The aortic valve is tricuspid. There is mild calcification of the  aortic valve. There is mild thickening of the aortic valve. Aortic valve  regurgitation is not visualized. Aortic valve sclerosis is present, with  no evidence of aortic valve stenosis.   8. The inferior vena cava is dilated in size with >50% respiratory  variability, suggesting right atrial pressure of 8 mmHg.   Laboratory Data: High Sensitivity Troponin:  No results for input(s): TROPONINIHS in the last 720 hours.   Chemistry Recent Labs  Lab 09/24/24 0351 09/24/24 1355 09/26/24 0901  NA 138 140 138  K 3.9 3.8 3.7  CL 103 103 99  CO2 24 27 29   GLUCOSE 108* 162* 166*  BUN 17 18 19   CREATININE 0.78 0.85 0.85  CALCIUM  9.3 9.5 9.8  MG 2.3 2.2  --   GFRNONAA >60 >60 >60  ANIONGAP 11 11 10     Recent Labs  Lab 09/24/24 0351 09/24/24 1355  PROT 7.7 7.6  ALBUMIN 3.8 4.0  AST 34 32  ALT 26 32  ALKPHOS 70 71  BILITOT 0.6 0.7   Lipids No results for input(s): CHOL, TRIG, HDL, LABVLDL, LDLCALC, CHOLHDL  in the last 168 hours.  Hematology Recent Labs  Lab 09/23/24 2152 09/23/24 2224 09/24/24 0351 09/24/24 1355  WBC 9.9  --   --  8.9  RBC 3.91  --  3.57* 3.71*  HGB 11.6* 11.9*  --  11.2*  HCT 36.6 35.0*  --  34.5*  MCV 93.6  --   --  93.0  MCH 29.7  --   --  30.2  MCHC 31.7  --   --  32.5  RDW 13.6  --   --  13.6  PLT 171  --   --  158   Thyroid   Recent Labs  Lab 09/24/24 0548  TSH 1.950  FREET4 0.87    BNP Recent Labs  Lab 09/23/24 2152  PROBNP 1,560.0*    DDimer No results for input(s): DDIMER in the last 168 hours.  Radiology/Studies:  CT SOFT TISSUE NECK W CONTRAST Result Date: 09/25/2024 EXAM: CT NECK WITH CONTRAST 09/25/2024 11:18:50 AM TECHNIQUE: CT of the neck was performed with the administration of 80 mL of iohexol  (OMNIPAQUE ) 300 MG/ML solution. Multiplanar reformatted images are provided for review. Automated exposure control, iterative reconstruction, and/or weight based adjustment of the mA/kV was utilized to reduce the radiation dose to as low as reasonably achievable. COMPARISON: None available. CLINICAL HISTORY: Thyroid  nodule. FINDINGS: AERODIGESTIVE TRACT: No discrete mass. No edema. SALIVARY GLANDS: The parotid and submandibular glands are unremarkable. THYROID : UM Multinodular goiter is similar to prior CT scans and numerous thyroid  ultrasounds. This corresponds with the finding on the MRA. Calcified nodules are present in the inferior aspect of the gland bilaterally. LYMPH NODES: No suspicious cervical lymphadenopathy. SOFT TISSUES: No mass or fluid collection. BRAIN, ORBITS, SINUSES AND MASTOIDS: No acute abnormality. LUNGS AND MEDIASTINUM: No acute abnormality. BONES:  Straightening of the normal cervical lordosis is present. No focal bone abnormality. IMPRESSION: 1. Multinodular goiter with calcified nodules in the inferior aspect of the gland bilaterally, similar to prior CT scans and numerous thyroid  ultrasounds. No follow-up imaging recommended.  Electronically signed by: Lonni Necessary MD 09/25/2024 11:44 AM EST RP Workstation: [id number]   ECHOCARDIOGRAM COMPLETE Result Date: 09/24/2024    ECHOCARDIOGRAM REPORT   Patient Name:   SISSI PADIA Date of Exam: 09/24/2024 Medical Rec #:  [id number]        Height:       66.0 in Accession #:    [id number]       Weight:       234.0 lb Date of Birth:  Sep 19, 1951        BSA:          2.138 m Patient Age:    73 years         BP:           130/59 mmHg Patient Gender: F                HR:           54 bpm. Exam Location:  Inpatient Procedure: 2D Echo, Cardiac Doppler and Color Doppler (Both Spectral and Color            Flow Doppler were utilized during procedure). Indications:    CHF-Acute Diastolic I50.31  History:        Patient has prior history of Echocardiogram examinations, most                 recent 03/04/2023. CHF.  Sonographer:    Jayson Gaskins Referring Phys: [id number] ANASTASSIA DOUTOVA IMPRESSIONS  1. Left ventricular ejection fraction, by estimation, is 55 to 60%. The left ventricle has normal function. The left ventricle has no regional wall motion abnormalities. Left ventricular diastolic parameters are indeterminate.  2. Right ventricular systolic function was not well visualized. The right ventricular size is not well visualized.  3. Left atrial size was moderately dilated.  4. Right atrial size was moderately dilated.  5. The mitral valve is abnormal. Mild mitral valve regurgitation. No evidence of mitral stenosis.  6. Tricuspid valve regurgitation is mild to moderate.  7. The aortic valve is tricuspid. There is mild calcification of the aortic valve. There is mild thickening of the aortic valve. Aortic valve regurgitation is not visualized. Aortic valve sclerosis is present, with no evidence of aortic valve stenosis.  8. The inferior vena cava is dilated in size with >50% respiratory variability, suggesting right atrial pressure of 8 mmHg. FINDINGS  Left Ventricle: Left ventricular ejection  fraction, by estimation, is 55 to 60%. The left ventricle has normal function. The left ventricle has no regional wall motion abnormalities. Strain was performed and the global longitudinal strain is indeterminate. The left ventricular internal cavity size was normal in size. There is no left ventricular hypertrophy. Left ventricular diastolic parameters are indeterminate. Right Ventricle: The right ventricular size is not well visualized. No increase in right ventricular wall thickness. Right ventricular systolic function was not well visualized. Left Atrium: Left atrial size was moderately dilated. Right Atrium: Right atrial size was moderately dilated. Pericardium: There is no evidence of pericardial effusion. Mitral Valve: The mitral valve is abnormal. There is mild thickening of the mitral valve leaflet(s). There is mild calcification of the mitral valve leaflet(s). Mild mitral valve regurgitation. No evidence of mitral valve stenosis. Tricuspid Valve: The tricuspid valve is normal  in structure. Tricuspid valve regurgitation is mild to moderate. No evidence of tricuspid stenosis. Aortic Valve: The aortic valve is tricuspid. There is mild calcification of the aortic valve. There is mild thickening of the aortic valve. Aortic valve regurgitation is not visualized. Aortic valve sclerosis is present, with no evidence of aortic valve stenosis. Aortic valve mean gradient measures 7.0 mmHg. Aortic valve peak gradient measures 10.4 mmHg. Aortic valve area, by VTI measures 2.13 cm. Pulmonic Valve: The pulmonic valve was normal in structure. Pulmonic valve regurgitation is not visualized. No evidence of pulmonic stenosis. Aorta: The aortic root is normal in size and structure. Venous: The inferior vena cava is dilated in size with greater than 50% respiratory variability, suggesting right atrial pressure of 8 mmHg. IAS/Shunts: No atrial level shunt detected by color flow Doppler. Additional Comments: 3D was performed  not requiring image post processing on an independent workstation and was indeterminate.  LEFT VENTRICLE PLAX 2D LVIDd:         5.00 cm LVIDs:         3.20 cm LV PW:         1.00 cm LV IVS:        1.10 cm LVOT diam:     1.80 cm LV SV:         79 LV SV Index:   37 LVOT Area:     2.54 cm  RIGHT VENTRICLE            IVC RV S prime:     9.68 cm/s  IVC diam: 3.00 cm TAPSE (M-mode): 1.9 cm LEFT ATRIUM             Index LA Vol (A2C):   57.5 ml 26.90 ml/m LA Vol (A4C):   76.3 ml 35.69 ml/m LA Biplane Vol: 69.7 ml 32.60 ml/m  AORTIC VALVE AV Area (Vmax):    2.10 cm AV Area (Vmean):   1.83 cm AV Area (VTI):     2.13 cm AV Vmax:           161.00 cm/s AV Vmean:          124.000 cm/s AV VTI:            0.369 m AV Peak Grad:      10.4 mmHg AV Mean Grad:      7.0 mmHg LVOT Vmax:         133.00 cm/s LVOT Vmean:        89.100 cm/s LVOT VTI:          0.309 m LVOT/AV VTI ratio: 0.84  AORTA Ao Root diam: 2.90 cm MITRAL VALVE                TRICUSPID VALVE MV Area (PHT): 3.86 cm     TR Peak grad:   44.9 mmHg MV E velocity: 109.00 cm/s  TR Vmax:        335.00 cm/s                              SHUNTS                             Systemic VTI:  0.31 m                             Systemic Diam: 1.80 cm Maude Emmer MD Electronically  signed by Maude Emmer MD Signature Date/Time: 09/24/2024/3:26:54 PM    Final    MR ANGIO NECK W WO CONTRAST Result Date: 09/24/2024 EXAM: MRA Neck without and with contrast 09/24/2024 08:37:14 AM TECHNIQUE: Multiplanar multisequence MRA of the neck was performed without and with the administration of 10 mL gadobutrol  (GADAVIST ) 1 MMOL/ML injection. 2D and 3D reformatted images are provided for review. Stenosis of the internal carotid arteries is measured using NASCET criteria. COMPARISON: CTA chest dated 09/23/2024. CLINICAL HISTORY: 73 year old female with acute neck pain, no red flags. Abnormal thoracic inlet soft tissues on prior CTA chest. FINDINGS: OTHER: Asymmetric and indistinct enhancement of  the left thyroid , which is otherwise not well evaluated on this exam (series 21 image 48). AORTIC ARCH: 3 vessel aortic arch configuration. CAROTID ARTERIES: Precontrast time of flight MR angiogram images reveal antegrade fluid in the bilateral cervical carotid arteries. Carotid bifurcations appear patent and within normal limits. Postcontrast neck MR angiogram images demonstrate common carotid arteries, carotid bifurcations, cervical internal carotid arteries, and visible ICA siphons appear patent without evidence of stenosis. No dissection. No hemodynamically significant stenosis by NASCET criteria. VERTEBRAL ARTERIES: Precontrast time of flight MR angiogram images reveal antegrade fluid in the bilateral cervical vertebral arteries. The left vertebral artery appears mildly dominant. Postcontrast neck MR angiogram images demonstrate both vertebral arteries appear patent to the vertebrobasilar junction without evidence of hemodynamically significant stenosis. Visible basilar artery is patent. No dissection. OTHER: The findings are negative for age-related significant pathology. IMPRESSION: 1. Asymmetric and indistinct enhancement of the left thyroid , not well evaluated by MRA. Dedicated Neck CT with IV contrast is the preferred first line imaging for evaluation of possible abnormal thyroid  and soft tissue at the thoracic inlet. MRI neck without and with contrast is less preferred. 2. Negative-for-age MRI angiogram of the neck. Electronically signed by: Helayne Hurst MD 09/24/2024 08:52 AM EST RP Workstation: [id number]   CT Angio Chest PE W/Cm &/Or Wo Cm Result Date: 09/23/2024 EXAM: CTA CHEST AORTA 09/23/2024 11:08:19 PM TECHNIQUE: CTA of the chest was performed after the administration of intravenous contrast. Multiplanar reformatted images are provided for review. MIP images are provided for review. Automated exposure control, iterative reconstruction, and/or weight based adjustment of the mA/kV was utilized to  reduce the radiation dose to as low as reasonably achievable. COMPARISON: None available. CLINICAL HISTORY: Pulmonary embolism (PE) suspected, high prob. FINDINGS: AORTA: Mild atherosclerotic calcification within the thoracic aorta. No aortic aneurysm. No thoracic aortic dissection. No intramural hematoma. No aortic ulceration. No rupture. MEDIASTINUM: Moderate multivessel coronary artery calcification. Mild-to-moderate cardiomegaly with right ventricular and right atrial enlargement. There is reflux with contrast at the hepatic venous system in keeping with at least some degree of right heart failure. No pericardial effusion. Central pulmonary arteries are of normal caliber. No pulmonary embolism. LYMPH NODES: No mediastinal, hilar or axillary lymphadenopathy. LUNGS AND PLEURA: Bibasilar atelectasis. Segmental air trapping within the medial and anterior segment of the right lower lobe, possibly related to small airways disease or bronchial atresia given its isolated nature. Bronchomalacia involving the bronchus intermedius best seen on image 57/12 with collapse on this expiratory phase examination. Bronchial wall thickening noted in keeping with airway inflammation. No focal consolidation or pulmonary edema. No pleural effusion or pneumothorax. UPPER ABDOMEN: Cholelithiasis partially visualized. SOFT TISSUES AND BONES: There is relatively poor delineation of the thyroid  gland with infiltrative changes surrounding the esophagus and carotid vasculature at the thoracic inlet. This appearance may be in part related to low-dose imaging and bolus  timing for this radicular study, however, an infiltrative process such as anaplastic thyroid  cancer, or changes of prior radiation therapy could appear this fashion. Esophagus otherwise unremarkable. Osseous structures are age appropriate. No acute bone abnormality. No lytic or blastic bone lesion. IMPRESSION: 1. No evidence of pulmonary embolism. 2. Mild-to-moderate  cardiomegaly with right heart enlargement and reflux of contrast into the hepatic venous system, consistent with right heart failure. 3. Infiltrative changes surrounding the esophagus and carotid vasculature at the thoracic inlet with poor delineation of the thyroid ; given the possibility of an infiltrative thyroid  process with extension into the deep soft tissues of the thoracic inlet, neck CT or MRI with contrast is recommended for further evaluation. 4. Bronchoalacia involving the bronchus intermedius with collapse on this expiratory phase examination. Bronchial wall thickening in keeping with airway inflammation. Electronically signed by: Dorethia Molt MD 09/23/2024 11:32 PM EST RP Workstation: [id number]   DG Chest 2 View Result Date: 09/23/2024 CLINICAL DATA:  Cough and shortness of breath EXAM: CHEST - 2 VIEW COMPARISON:  Chest x-ray 10/12/2011 FINDINGS: The heart is enlarged. Loop recorder device again seen. No pleural effusion or pneumothorax. Both lungs are clear. The visualized skeletal structures are unremarkable. IMPRESSION: 1. No active cardiopulmonary disease. 2. Cardiomegaly. Electronically Signed   By: Greig Pique M.D.   On: 09/23/2024 20:30   Assessment and Plan: Atrial fibrillation, persistent ILR summary report 09/22/2024 notes A-fib burden 2.8%, longest duration 2 hours Rate control: Slow ventricular controlled A-fib Rhythm control: NA Thromboembolic prophylaxis: Eliquis   Sees EP - could consider Tikosyn if needed.  ILR noted afib burden 2.8%  Second-degree type I AV block Intermittently noted on telemetry  Not on AV nodal blocking agents. Monitor for now  Chronic right heart failure hs troponins negative x 4, BNP 1560 Chest x-ray did not illustrate vascular congestion. CT findings suggestive of right heart failure Net IO Since Admission: -1,077 mL [09/26/24 1056] Lasix  40 mg IV push daily - change to bid, anticipate changing to p.o. in the next 24 to 48  hours. Compression stocking.  Would recommend evaluation for sleep apnea as outpatient.  No prior RHC - once euvolemic consider it during this hospitalization or as outpatient.  Risks, benefits, and alternatives discussed with the patient.  Patient would like some time to think about it. ILR results reviewed.   Iron deficiency anemia with history of gastric ulcer/H. pylori positive: Hb trend reviewed.   HLD: continue lipid lowering agents.    Risk Assessment/Risk Scores:      CHA2DS2-VASc Score = 4   This indicates a 4.8% annual risk of stroke. The patient's score is based upon: CHF History: 1 HTN History: 0 Diabetes History: 0 Stroke History: 0 Vascular Disease History: 1 Age Score: 1 Gender Score: 1     For questions or updates, please contact [facility] Please consult [email or web address] for contact info under      Signed, Madonna Large, DO  09/26/2024 10:56 AM

## 2024-09-26 NOTE — TOC Progression Note (Signed)
 Transition of Care [facility]) - Progression Note    Patient Details  Name: Brittney Walker MRN: [id number] Date of Birth: [birth year]  [facility] CM/SW Contact  Sonda Manuella Quill, RN Phone Number: 09/26/2024, 5:56 PM  Clinical Narrative:    IP CM for d/c planning; spoke w/ pt in room; she requested IP CM to speak w/ her tomorrow as she is on phone call and she has visitors; unable to complete assessment; will pass on to oncoming IP CM for follow up.                    Expected Discharge Plan and Services                                               Social Drivers of Health (SDOH) Interventions SDOH Screenings   Food Insecurity: No Food Insecurity (09/24/2024)  Housing: Low Risk  (09/24/2024)  Transportation Needs: No Transportation Needs (09/24/2024)  Utilities: Not At Risk (09/24/2024)  Alcohol Screen: Low Risk  (08/17/2024)  Depression (PHQ2-9): Low Risk  (08/17/2024)  Financial Resource Strain: Low Risk  (08/17/2024)  Physical Activity: Inactive (08/17/2024)  Social Connections: Moderately Integrated (09/24/2024)  Stress: No Stress Concern Present (08/17/2024)  Tobacco Use: Low Risk  (09/24/2024)  Health Literacy: Adequate Health Literacy (08/17/2024)    Readmission Risk Interventions     No data to display

## 2024-09-26 NOTE — Plan of Care (Signed)

## 2024-09-26 NOTE — Progress Notes (Addendum)
 [facility] PROGRESS NOTE    Progress Note  Brittney Walker  FMW:[id number] DOB: April 12, 1951 DOA: 09/23/2024 PCP: Amon Aloysius BRAVO, MD     Brief Narrative:   Brittney Walker is an 73 y.o. female past medical history significant for paroxysmal atrial fibrillation on anticoagulation, Mobitz 1 AV block, right sided heart failure comes in for complaints of lower extremity swelling, chronic cough and shortness of breath, she is also been complaining of orthopnea, proBNP of 1500 CT angio of the chest showed no evidence of PE mild cardiomegaly right heart enlargement and reflux of contrast into the hepatic venous system.  It also showed infiltrative changes surrounding the esophagus and carotid vasculature at the thoracic inlet with poor delineation of the thyroid  with extension into the deep soft tissue thoracic inlet  Assessment/Plan:   Orthopnea with elevated BNP, and A-fib with a slow ventricular rate into the 30s: Trace lower extremity edema, relates she is not short of breath. She had a persistent cough with this new mass in the neck see below for further details. Continue IV Lasix . Continue strict I's and O's and daily weights. Out of bed to chair consult physical therapy. We have consulted cardiology as we believe her symptoms of orthopnea and cough are related to her slow ventricular rate. Card consulted, RHC at some point probably as an outpaient.  Multinodular goiter: CT showed multinodular goiter with numerous thyroid  nodules, which corresponds to finding on the MRI some are calcified. MRA of the neck asymmetric enhancement of the left thyroid  MRI of the head was negative for vascular invasion  Hypothyroidism Continue Synthroid.     DVT prophylaxis: Lovenox  Family Communication:none Status is: Observation The patient remains OBS appropriate and will d/c before 2 midnights.    Code Status:     Code Status Orders  (From admission, onward)           Start      Ordered   09/24/24 0040  Full code  Continuous       Question:  By:  Answer:  Consent: discussion documented in EHR   09/24/24 0040           Code Status History     Date Active Date Inactive Code Status Order ID Comments User Context   10/10/2021 2014 10/17/2021 2252 Full Code [id number]  Vivienne Lonni Ingle, NP Inpatient         IV Access:   Peripheral IV   Procedures and diagnostic studies:   CT SOFT TISSUE NECK W CONTRAST Result Date: 09/25/2024 EXAM: CT NECK WITH CONTRAST 09/25/2024 11:18:50 AM TECHNIQUE: CT of the neck was performed with the administration of 80 mL of iohexol  (OMNIPAQUE ) 300 MG/ML solution. Multiplanar reformatted images are provided for review. Automated exposure control, iterative reconstruction, and/or weight based adjustment of the mA/kV was utilized to reduce the radiation dose to as low as reasonably achievable. COMPARISON: None available. CLINICAL HISTORY: Thyroid  nodule. FINDINGS: AERODIGESTIVE TRACT: No discrete mass. No edema. SALIVARY GLANDS: The parotid and submandibular glands are unremarkable. THYROID : UM Multinodular goiter is similar to prior CT scans and numerous thyroid  ultrasounds. This corresponds with the finding on the MRA. Calcified nodules are present in the inferior aspect of the gland bilaterally. LYMPH NODES: No suspicious cervical lymphadenopathy. SOFT TISSUES: No mass or fluid collection. BRAIN, ORBITS, SINUSES AND MASTOIDS: No acute abnormality. LUNGS AND MEDIASTINUM: No acute abnormality. BONES: Straightening of the normal cervical lordosis is present. No focal bone abnormality. IMPRESSION: 1. Multinodular goiter with calcified nodules  in the inferior aspect of the gland bilaterally, similar to prior CT scans and numerous thyroid  ultrasounds. No follow-up imaging recommended. Electronically signed by: Lonni Necessary MD 09/25/2024 11:44 AM EST RP Workstation: [id number]   ECHOCARDIOGRAM COMPLETE Result Date: 09/24/2024     ECHOCARDIOGRAM REPORT   Patient Name:   Brittney Walker Date of Exam: 09/24/2024 Medical Rec #:  [id number]        Height:       66.0 in Accession #:    [id number]       Weight:       234.0 lb Date of Birth:  June 20, 1951        BSA:          2.138 m Patient Age:    73 years         BP:           130/59 mmHg Patient Gender: F                HR:           54 bpm. Exam Location:  Inpatient Procedure: 2D Echo, Cardiac Doppler and Color Doppler (Both Spectral and Color            Flow Doppler were utilized during procedure). Indications:    CHF-Acute Diastolic I50.31  History:        Patient has prior history of Echocardiogram examinations, most                 recent 03/04/2023. CHF.  Sonographer:    Jayson Gaskins Referring Phys: [id number] ANASTASSIA DOUTOVA IMPRESSIONS  1. Left ventricular ejection fraction, by estimation, is 55 to 60%. The left ventricle has normal function. The left ventricle has no regional wall motion abnormalities. Left ventricular diastolic parameters are indeterminate.  2. Right ventricular systolic function was not well visualized. The right ventricular size is not well visualized.  3. Left atrial size was moderately dilated.  4. Right atrial size was moderately dilated.  5. The mitral valve is abnormal. Mild mitral valve regurgitation. No evidence of mitral stenosis.  6. Tricuspid valve regurgitation is mild to moderate.  7. The aortic valve is tricuspid. There is mild calcification of the aortic valve. There is mild thickening of the aortic valve. Aortic valve regurgitation is not visualized. Aortic valve sclerosis is present, with no evidence of aortic valve stenosis.  8. The inferior vena cava is dilated in size with >50% respiratory variability, suggesting right atrial pressure of 8 mmHg. FINDINGS  Left Ventricle: Left ventricular ejection fraction, by estimation, is 55 to 60%. The left ventricle has normal function. The left ventricle has no regional wall motion abnormalities. Strain was  performed and the global longitudinal strain is indeterminate. The left ventricular internal cavity size was normal in size. There is no left ventricular hypertrophy. Left ventricular diastolic parameters are indeterminate. Right Ventricle: The right ventricular size is not well visualized. No increase in right ventricular wall thickness. Right ventricular systolic function was not well visualized. Left Atrium: Left atrial size was moderately dilated. Right Atrium: Right atrial size was moderately dilated. Pericardium: There is no evidence of pericardial effusion. Mitral Valve: The mitral valve is abnormal. There is mild thickening of the mitral valve leaflet(s). There is mild calcification of the mitral valve leaflet(s). Mild mitral valve regurgitation. No evidence of mitral valve stenosis. Tricuspid Valve: The tricuspid valve is normal in structure. Tricuspid valve regurgitation is mild to moderate. No evidence of tricuspid stenosis. Aortic Valve: The aortic valve  is tricuspid. There is mild calcification of the aortic valve. There is mild thickening of the aortic valve. Aortic valve regurgitation is not visualized. Aortic valve sclerosis is present, with no evidence of aortic valve stenosis. Aortic valve mean gradient measures 7.0 mmHg. Aortic valve peak gradient measures 10.4 mmHg. Aortic valve area, by VTI measures 2.13 cm. Pulmonic Valve: The pulmonic valve was normal in structure. Pulmonic valve regurgitation is not visualized. No evidence of pulmonic stenosis. Aorta: The aortic root is normal in size and structure. Venous: The inferior vena cava is dilated in size with greater than 50% respiratory variability, suggesting right atrial pressure of 8 mmHg. IAS/Shunts: No atrial level shunt detected by color flow Doppler. Additional Comments: 3D was performed not requiring image post processing on an independent workstation and was indeterminate.  LEFT VENTRICLE PLAX 2D LVIDd:         5.00 cm LVIDs:          3.20 cm LV PW:         1.00 cm LV IVS:        1.10 cm LVOT diam:     1.80 cm LV SV:         79 LV SV Index:   37 LVOT Area:     2.54 cm  RIGHT VENTRICLE            IVC RV S prime:     9.68 cm/s  IVC diam: 3.00 cm TAPSE (M-mode): 1.9 cm LEFT ATRIUM             Index LA Vol (A2C):   57.5 ml 26.90 ml/m LA Vol (A4C):   76.3 ml 35.69 ml/m LA Biplane Vol: 69.7 ml 32.60 ml/m  AORTIC VALVE AV Area (Vmax):    2.10 cm AV Area (Vmean):   1.83 cm AV Area (VTI):     2.13 cm AV Vmax:           161.00 cm/s AV Vmean:          124.000 cm/s AV VTI:            0.369 m AV Peak Grad:      10.4 mmHg AV Mean Grad:      7.0 mmHg LVOT Vmax:         133.00 cm/s LVOT Vmean:        89.100 cm/s LVOT VTI:          0.309 m LVOT/AV VTI ratio: 0.84  AORTA Ao Root diam: 2.90 cm MITRAL VALVE                TRICUSPID VALVE MV Area (PHT): 3.86 cm     TR Peak grad:   44.9 mmHg MV E velocity: 109.00 cm/s  TR Vmax:        335.00 cm/s                              SHUNTS                             Systemic VTI:  0.31 m                             Systemic Diam: 1.80 cm Maude Emmer MD Electronically signed by Maude Emmer MD Signature Date/Time: 09/24/2024/3:26:54 PM    Final      Medical  Consultants:   None.   Subjective:    Brittney Walker relates her cough is now resolved.  Objective:    Vitals:   09/25/24 1030 09/25/24 1345 09/26/24 0340 09/26/24 0500  BP: (!) 130/59 (!) 107/54 118/60   Pulse: (!) 43 (!) 42 62   Resp: 18 18 19    Temp:  98.4 F (36.9 C) 98.7 F (37.1 C)   TempSrc:  Oral Oral   SpO2: 98% 97% 97%   Weight:    107.2 kg  Height:       SpO2: 97 %   Intake/Output Summary (Last 24 hours) at 09/26/2024 0845 Last data filed at 09/26/2024 0818 Gross per 24 hour  Intake 843 ml  Output 1750 ml  Net -907 ml   Filed Weights   09/24/24 2100 09/25/24 0600 09/26/24 0500  Weight: 107.3 kg 106.7 kg 107.2 kg    Exam: General exam: In no acute distress. Respiratory system: Good air movement and clear  to auscultation. Cardiovascular system: S1 & S2 heard, RRR. No JVD. Gastrointestinal system: Abdomen is nondistended, soft and nontender.  Extremities: No pedal edema. Skin: No rashes, lesions or ulcers Psychiatry: Judgement and insight appear normal. Mood & affect appropriate.  Data Reviewed:    Labs: Basic Metabolic Panel: Recent Labs  Lab 09/23/24 2224 09/24/24 0351 09/24/24 1355  NA 141 138 140  K 4.2 3.9 3.8  CL 102 103 103  CO2  --  24 27  GLUCOSE 91 108* 162*  BUN 21 17 18   CREATININE 1.00 0.78 0.85  CALCIUM   --  9.3 9.5  MG  --  2.3 2.2  PHOS  --  3.2 3.1   GFR Estimated Creatinine Clearance: 72.6 mL/min (by C-G formula based on SCr of 0.85 mg/dL). Liver Function Tests: Recent Labs  Lab 09/24/24 0351 09/24/24 1355  AST 34 32  ALT 26 32  ALKPHOS 70 71  BILITOT 0.6 0.7  PROT 7.7 7.6  ALBUMIN 3.8 4.0   No results for input(s): LIPASE, AMYLASE in the last 168 hours. No results for input(s): AMMONIA in the last 168 hours. Coagulation profile Recent Labs  Lab 09/24/24 0351  INR 1.4*   COVID-19 Labs  Recent Labs    09/24/24 0548  FERRITIN 217    Lab Results  Component Value Date   SARSCOV2NAA NEGATIVE 09/23/2024   SARSCOV2NAA NEGATIVE 10/10/2021   SARSCOV2NAA NEGATIVE 09/04/2021   SARSCOV2NAA NEGATIVE 07/17/2021    CBC: Recent Labs  Lab 09/23/24 2152 09/23/24 2224 09/24/24 1355  WBC 9.9  --  8.9  NEUTROABS 7.5  --   --   HGB 11.6* 11.9* 11.2*  HCT 36.6 35.0* 34.5*  MCV 93.6  --  93.0  PLT 171  --  158   Cardiac Enzymes: Recent Labs  Lab 09/24/24 0351  CKTOTAL 134   BNP (last 3 results) Recent Labs    08/17/24 1001 09/23/24 2152  PROBNP 225.0* 1,560.0*   CBG: No results for input(s): GLUCAP in the last 168 hours. D-Dimer: No results for input(s): DDIMER in the last 72 hours. Hgb A1c: No results for input(s): HGBA1C in the last 72 hours. Lipid Profile: No results for input(s): CHOL, HDL, LDLCALC,  TRIG, CHOLHDL, LDLDIRECT in the last 72 hours. Thyroid  function studies: Recent Labs    09/24/24 0548  TSH 1.950   Anemia work up: Recent Labs    09/24/24 0351 09/24/24 0548  VITAMINB12  --  1,109*  FOLATE  --  15.0  FERRITIN  --  217  TIBC  --  332  IRON  --  26*  RETICCTPCT 1.4  --    Sepsis Labs: Recent Labs  Lab 09/23/24 2152 09/24/24 1355  WBC 9.9 8.9   Microbiology Recent Results (from the past 240 hours)  Resp panel by RT-PCR (RSV, Flu A&B, Covid) Anterior Nasal Swab     Status: None   Collection Time: 09/23/24  7:59 PM   Specimen: Anterior Nasal Swab  Result Value Ref Range Status   SARS Coronavirus 2 by RT PCR NEGATIVE NEGATIVE Final    Comment: (NOTE) SARS-CoV-2 target nucleic acids are NOT DETECTED.  The SARS-CoV-2 RNA is generally detectable in upper respiratory specimens during the acute phase of infection. The lowest concentration of SARS-CoV-2 viral copies this assay can detect is 138 copies/mL. A negative result does not preclude SARS-Cov-2 infection and should not be used as the sole basis for treatment or other patient management decisions. A negative result may occur with  improper specimen collection/handling, submission of specimen other than nasopharyngeal swab, presence of viral mutation(s) within the areas targeted by this assay, and inadequate number of viral copies(<138 copies/mL). A negative result must be combined with clinical observations, patient history, and epidemiological information. The expected result is Negative.  Fact Sheet for Patients:  bloggercourse.com  Fact Sheet for Healthcare Providers:  seriousbroker.it  This test is no t yet approved or cleared by the [place]  FDA and  has been authorized for detection and/or diagnosis of SARS-CoV-2 by FDA under an Emergency Use Authorization (EUA). This EUA will remain  in effect (meaning this test can be used) for the  duration of the COVID-19 declaration under Section 564(b)(1) of the Act, 21 U.S.C.section 360bbb-3(b)(1), unless the authorization is terminated  or revoked sooner.       Influenza A by PCR NEGATIVE NEGATIVE Final   Influenza B by PCR NEGATIVE NEGATIVE Final    Comment: (NOTE) The Xpert Xpress SARS-CoV-2/FLU/RSV plus assay is intended as an aid in the diagnosis of influenza from Nasopharyngeal swab specimens and should not be used as a sole basis for treatment. Nasal washings and aspirates are unacceptable for Xpert Xpress SARS-CoV-2/FLU/RSV testing.  Fact Sheet for Patients: bloggercourse.com  Fact Sheet for Healthcare Providers: seriousbroker.it  This test is not yet approved or cleared by the [place]  FDA and has been authorized for detection and/or diagnosis of SARS-CoV-2 by FDA under an Emergency Use Authorization (EUA). This EUA will remain in effect (meaning this test can be used) for the duration of the COVID-19 declaration under Section 564(b)(1) of the Act, 21 U.S.C. section 360bbb-3(b)(1), unless the authorization is terminated or revoked.     Resp Syncytial Virus by PCR NEGATIVE NEGATIVE Final    Comment: (NOTE) Fact Sheet for Patients: bloggercourse.com  Fact Sheet for Healthcare Providers: seriousbroker.it  This test is not yet approved or cleared by the [place]  FDA and has been authorized for detection and/or diagnosis of SARS-CoV-2 by FDA under an Emergency Use Authorization (EUA). This EUA will remain in effect (meaning this test can be used) for the duration of the COVID-19 declaration under Section 564(b)(1) of the Act, 21 U.S.C. section 360bbb-3(b)(1), unless the authorization is terminated or revoked.  Performed at [facility], [address], [place], [place] [address]      Medications:    enoxaparin  (LOVENOX )  injection  100 mg Subcutaneous Q12H   ezetimibe   10 mg Oral Daily   furosemide   40 mg Intravenous Daily  predniSONE   40 mg Oral Q breakfast   rosuvastatin   40 mg Oral QHS   sodium chloride  flush  3 mL Intravenous Q12H   Continuous Infusions:      LOS: 0 days   Erle Odell Castor  [facility]  09/26/2024, 8:45 AM

## 2024-09-26 NOTE — Progress Notes (Signed)
 Mobility Specialist - Progress Note   09/26/24 1029  Mobility  Activity Ambulated with assistance  Level of Assistance Standby assist, set-up cues, supervision of patient - no hands on  Assistive Device None;Front wheel walker  Distance Ambulated (ft) 300 ft  Range of Motion/Exercises Active  Activity Response Tolerated well  Mobility visit 1 Mobility  Mobility Specialist Start Time (ACUTE ONLY) 1010  Mobility Specialist Stop Time (ACUTE ONLY) 1029  Mobility Specialist Time Calculation (min) (ACUTE ONLY) 19 min   Pt was found in bed and agreeable to mobilize. No complaints. At EOS returned to recliner chair with all needs met. Call bell in reach.   Erminio Leos,  Mobility Specialist Can be reached via Secure Chat

## 2024-09-27 ENCOUNTER — Ambulatory Visit: Payer: Self-pay | Admitting: Cardiovascular Disease

## 2024-09-27 DIAGNOSIS — I5033 Acute on chronic diastolic (congestive) heart failure: Secondary | ICD-10-CM | POA: Diagnosis not present

## 2024-09-27 DIAGNOSIS — I441 Atrioventricular block, second degree: Secondary | ICD-10-CM | POA: Diagnosis not present

## 2024-09-27 LAB — BASIC METABOLIC PANEL WITH GFR
Anion gap: 10 (ref 5–15)
BUN: 25 mg/dL — ABNORMAL HIGH (ref 8–23)
CO2: 30 mmol/L (ref 22–32)
Calcium: 9.5 mg/dL (ref 8.9–10.3)
Chloride: 101 mmol/L (ref 98–111)
Creatinine, Ser: 1.12 mg/dL — ABNORMAL HIGH (ref 0.44–1.00)
GFR, Estimated: 51 mL/min — ABNORMAL LOW (ref >60)
Glucose, Bld: 117 mg/dL — ABNORMAL HIGH (ref 70–99)
Potassium: 3.8 mmol/L (ref 3.5–5.1)
Sodium: 141 mmol/L (ref 135–145)

## 2024-09-27 MED ORDER — PREDNISONE 10 MG PO TABS: ORAL_TABLET | ORAL | 0 refills | Status: DC

## 2024-09-27 MED ORDER — FUROSEMIDE 40 MG PO TABS: 40.0000 mg | ORAL_TABLET | Freq: Every day | ORAL | 0 refills | Status: AC

## 2024-09-27 MED ORDER — FUROSEMIDE 40 MG PO TABS
40.0000 mg | ORAL_TABLET | Freq: Two times a day (BID) | ORAL | Status: DC
  Administered 2024-09-27: 40 mg via ORAL
  Filled 2024-09-27: qty 1

## 2024-09-27 MED ORDER — APIXABAN 5 MG PO TABS: 5.0000 mg | ORAL_TABLET | Freq: Two times a day (BID) | ORAL | Status: DC

## 2024-09-27 NOTE — Progress Notes (Signed)
 PHARMACY - ANTICOAGULATION CONSULT NOTE  Pharmacy Consult for Eliquis  Indication: atrial fibrillation  No Known Allergies  Patient Measurements: Height: 5' 6.5 (168.9 cm) Weight: 106.5 kg (234 lb 12.6 oz) IBW/kg (Calculated) : 60.45 HEPARIN DW (KG): 85.1  Vital Signs: Temp: 97.3 F (36.3 C) (11/24 0527) Temp Source: Oral (11/24 0527) BP: 114/64 (11/24 0527)  Labs: Recent Labs    09/24/24 1355 09/26/24 0901 09/27/24 0339  HGB 11.2*  --   --   HCT 34.5*  --   --   PLT 158  --   --   CREATININE 0.85 0.85 1.12*    Estimated Creatinine Clearance: 54.9 mL/min (A) (by C-G formula based on SCr of 1.12 mg/dL (H)).   Medical History: Past Medical History:  Diagnosis Date   Atrial fibrillation (HCC) 10/04/2021   Carotid bruit    w/ neg dopplers    CHF (congestive heart failure) (HCC)    History of gastric ulcer 10/04/2021   Hyperthyroidism     s/p RAI 2004   Normocytic anemia    Persistent atrial fibrillation (HCC)    a. Dx 07/2021; b. CHA2DS2VASc = 3-->Eliquis ; c. 10/2021 s/p DCCV.   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 CT neg for PE. No melena reported during hospitalization. -11/24 Resume Eliquis   Goal of Therapy:   Therapeutic oral anticoagulation Plan:  D/c Lovenox . Got dose 11/24 AM Eliquis  5mg  BID resume 11/24 PM   Sequan Auxier Karoline Marina, PharmD, BCPS Clinical Staff Pharmacist Marina Salines Stillinger 09/27/2024,10:12 AM

## 2024-09-27 NOTE — TOC Transition Note (Signed)
 Transition of Care [facility]) - Discharge Note  Patient Details  Name: Brittney Walker MRN: [id number] Date of Birth: Sep 16, 1951  Transition of Care [facility]) CM/SW Contact:  Duwaine GORMAN Aran, LCSW Phone Number: 09/27/2024, 12:07 PM  Clinical Narrative: PT evaluation recommended HH and a rollator. Patient agreeable to HHPT referral and to [employer] delivering a rollator to her room. CSW made [facility] referral in hub, but no HH agency would accept the referral. CSW discussed OPPT referral with patient. CSW made OPPT referral. CSW made DME referral to Zack with [employer]. [employer] to deliver rollator to room after patient puts card on file. Care management signing off.  Final next level of care: OP Rehab Barriers to Discharge: No Home Care Agency will accept this patient  Patient Goals and CMS Choice Patient states their goals for this hospitalization and ongoing recovery are:: Home CMS Medicare.gov Compare Post Acute Care list provided to:: Patient Choice offered to / list presented to : Patient  Discharge Plan and Services Additional resources added to the After Visit Summary for         DME Arranged: Walker rolling with seat DME Agency: [employer] Date DME Agency Contacted: 09/27/24 Time DME Agency Contacted: [phone] Representative spoke with at DME Agency: Zack  Social Drivers of Health (SDOH) Interventions SDOH Screenings   Food Insecurity: No Food Insecurity (09/24/2024)  Housing: Low Risk  (09/24/2024)  Transportation Needs: No Transportation Needs (09/24/2024)  Utilities: Not At Risk (09/24/2024)  Alcohol Screen: Low Risk  (08/17/2024)  Depression (PHQ2-9): Low Risk  (08/17/2024)  Financial Resource Strain: Low Risk  (08/17/2024)  Physical Activity: Inactive (08/17/2024)  Social Connections: Moderately Integrated (09/24/2024)  Stress: No Stress Concern Present (08/17/2024)  Tobacco Use: Low Risk  (09/24/2024)  Health Literacy: Adequate Health Literacy (08/17/2024)   Readmission Risk  Interventions     No data to display

## 2024-09-27 NOTE — Discharge Summary (Signed)
 Physician Discharge Summary  [name] FMW:[id number] DOB: 10/28/1951 DOA: 09/23/2024  PCP: Amon Aloysius BRAVO, MD  Admit date: 09/23/2024 Discharge date: 09/27/2024  Admitted From: Home Disposition:  Home  Recommendations for Outpatient Follow-up:  Follow up with PCP in 1-2 weeks Please obtain BMP/CBC in one week   Home Health:No Equipment/Devices:None  Discharge Condition:Stable CODE STATUS:Full Diet recommendation: Heart Healthy   Brief/Interim Summary:  73 y.o. female past medical history significant for paroxysmal atrial fibrillation on anticoagulation, Mobitz 1 AV block, right sided heart failure comes in for complaints of lower extremity swelling, chronic cough and shortness of breath, she is also been complaining of orthopnea, proBNP of 1500 CT angio of the chest showed no evidence of PE mild cardiomegaly right heart enlargement and reflux of contrast into the hepatic venous system.  It also showed infiltrative changes surrounding the esophagus and carotid vasculature at the thoracic inlet with poor delineation of the thyroid  with extension into the deep soft tissue thoracic inlet   Discharge Diagnoses:  Principal Problem:   Acute on chronic diastolic CHF (congestive heart failure) (HCC) Active Problems:   Acute on chronic diastolic (congestive) heart failure (HCC)   Hypothyroidism   PAF (paroxysmal atrial fibrillation) (HCC)   Pulmonary hypertension, unspecified (HCC)   Esophageal abnormality   Bronchitis   Second degree AV block, Mobitz type I   Chronic right heart failure (HCC)   Iron deficiency anemia   Mixed hyperlipidemia  Acute diastolic heart failure: With trace edema cardiology was consulted and was started on IV Lasix  she diuresed well. Cardiology recommended outpatient sleep study and right heart cath as an outpatient. She will continue Lasix  as an outpatient.  Multinodular goiter: CT showed multinodular goiter with numerous thyroid  nodule which  corresponds to findings on MRI. MRI of the neck showed asymmetric enhancement of the left thyroid  no vascular compromise or invasion.  Hypothyroidism: Continue Synthroid  Discharge Instructions  Discharge Instructions     Diet - low sodium heart healthy   Complete by: As directed    Diet - low sodium heart healthy   Complete by: As directed    Increase activity slowly   Complete by: As directed    Increase activity slowly   Complete by: As directed       Allergies as of 09/27/2024   No Known Allergies      Medication List     TAKE these medications    calcium  carbonate 1250 (500 Ca) MG tablet Commonly known as: OS-CAL - dosed in mg of elemental calcium  Take 1,250 mg by mouth daily with breakfast.   cyanocobalamin  1000 MCG tablet Commonly known as: VITAMIN B12 Take 1,000 mcg by mouth daily.   Eliquis  5 MG Tabs tablet Generic drug: apixaban  TAKE 1 TABLET BY MOUTH TWICE A DAY   ezetimibe  10 MG tablet Commonly known as: ZETIA  Take 1 tablet (10 mg total) by mouth daily.   furosemide  40 MG tablet Commonly known as: LASIX  Take 1 tablet (40 mg total) by mouth daily. What changed:  how much to take when to take this Another medication with the same name was removed. Continue taking this medication, and follow the directions you see here.   GINKGO BILOBA PO Take 1 capsule by mouth daily.   multivitamin with minerals Tabs tablet Take 1 tablet by mouth in the morning. Alive   potassium chloride  SA 20 MEQ tablet Commonly known as: KLOR-CON  M Take 1 tablet (20 mEq total) by mouth daily.   predniSONE  10  MG tablet Commonly known as: DELTASONE  Takes  3 tablets for 1 days, then 2 tabs for 1 days, then 1 tab for 1 days, and then stop.   rosuvastatin  40 MG tablet Commonly known as: CRESTOR  Take 1 tablet (40 mg total) by mouth at bedtime.   vitamin A 10000 UNIT capsule Take 10,000 Units by mouth daily.   vitamin C 1000 MG tablet Take 1,000 mg by mouth daily.    Vitamin D3 1000 units Caps Take 1,000 Units by mouth daily.   vitamin E 180 MG (400 UNITS) capsule Take 400 Units by mouth daily.   zinc gluconate 50 MG tablet Take 50 mg by mouth daily.               Durable Medical Equipment  (From admission, onward)           Start     Ordered   09/26/24 1752  For home use only DME 4 wheeled rolling walker with seat  Once       Question:  Patient needs a walker to treat with the following condition  Answer:  [name] as ambulation aid   09/26/24 1751            No Known Allergies  Consultations: Cardiology   Procedures/Studies: CT SOFT TISSUE NECK W CONTRAST Result Date: 09/25/2024 EXAM: CT NECK WITH CONTRAST 09/25/2024 11:18:50 AM TECHNIQUE: CT of the neck was performed with the administration of 80 mL of iohexol  (OMNIPAQUE ) 300 MG/ML solution. Multiplanar reformatted images are provided for review. Automated exposure control, iterative reconstruction, and/or weight based adjustment of the mA/kV was utilized to reduce the radiation dose to as low as reasonably achievable. COMPARISON: None available. CLINICAL HISTORY: Thyroid  nodule. FINDINGS: AERODIGESTIVE TRACT: No discrete mass. No edema. SALIVARY GLANDS: The parotid and submandibular glands are unremarkable. THYROID : UM Multinodular goiter is similar to prior CT scans and numerous thyroid  ultrasounds. This corresponds with the finding on the MRA. Calcified nodules are present in the inferior aspect of the gland bilaterally. LYMPH NODES: No suspicious cervical lymphadenopathy. SOFT TISSUES: No mass or fluid collection. BRAIN, ORBITS, SINUSES AND MASTOIDS: No acute abnormality. LUNGS AND MEDIASTINUM: No acute abnormality. BONES: Straightening of the normal cervical lordosis is present. No focal bone abnormality. IMPRESSION: 1. Multinodular goiter with calcified nodules in the inferior aspect of the gland bilaterally, similar to prior CT scans and numerous thyroid  ultrasounds. No  follow-up imaging recommended. Electronically signed by: Lonni Necessary MD 09/25/2024 11:44 AM EST RP Workstation: [id number]   ECHOCARDIOGRAM COMPLETE Result Date: 09/24/2024    ECHOCARDIOGRAM REPORT   Patient Name:   Brittney Walker Date of Exam: 09/24/2024 Medical Rec #:  [id number]        Height:       66.0 in Accession #:    [id number]       Weight:       234.0 lb Date of Birth:  [birth year]        BSA:          2.138 m Patient Age:    73 years         BP:           130/59 mmHg Patient Gender: F                HR:           54 bpm. Exam Location:  Inpatient Procedure: 2D Echo, Cardiac Doppler and Color Doppler (Both Spectral and Color  Flow Doppler were utilized during procedure). Indications:    CHF-Acute Diastolic I50.31  History:        Patient has prior history of Echocardiogram examinations, most                 recent 03/04/2023. CHF.  Sonographer:    Jayson Gaskins Referring Phys: [id number] ANASTASSIA DOUTOVA IMPRESSIONS  1. Left ventricular ejection fraction, by estimation, is 55 to 60%. The left ventricle has normal function. The left ventricle has no regional wall motion abnormalities. Left ventricular diastolic parameters are indeterminate.  2. Right ventricular systolic function was not well visualized. The right ventricular size is not well visualized.  3. Left atrial size was moderately dilated.  4. Right atrial size was moderately dilated.  5. The mitral valve is abnormal. Mild mitral valve regurgitation. No evidence of mitral stenosis.  6. Tricuspid valve regurgitation is mild to moderate.  7. The aortic valve is tricuspid. There is mild calcification of the aortic valve. There is mild thickening of the aortic valve. Aortic valve regurgitation is not visualized. Aortic valve sclerosis is present, with no evidence of aortic valve stenosis.  8. The inferior vena cava is dilated in size with >50% respiratory variability, suggesting right atrial pressure of 8 mmHg. FINDINGS  Left  Ventricle: Left ventricular ejection fraction, by estimation, is 55 to 60%. The left ventricle has normal function. The left ventricle has no regional wall motion abnormalities. Strain was performed and the global longitudinal strain is indeterminate. The left ventricular internal cavity size was normal in size. There is no left ventricular hypertrophy. Left ventricular diastolic parameters are indeterminate. Right Ventricle: The right ventricular size is not well visualized. No increase in right ventricular wall thickness. Right ventricular systolic function was not well visualized. Left Atrium: Left atrial size was moderately dilated. Right Atrium: Right atrial size was moderately dilated. Pericardium: There is no evidence of pericardial effusion. Mitral Valve: The mitral valve is abnormal. There is mild thickening of the mitral valve leaflet(s). There is mild calcification of the mitral valve leaflet(s). Mild mitral valve regurgitation. No evidence of mitral valve stenosis. Tricuspid Valve: The tricuspid valve is normal in structure. Tricuspid valve regurgitation is mild to moderate. No evidence of tricuspid stenosis. Aortic Valve: The aortic valve is tricuspid. There is mild calcification of the aortic valve. There is mild thickening of the aortic valve. Aortic valve regurgitation is not visualized. Aortic valve sclerosis is present, with no evidence of aortic valve stenosis. Aortic valve mean gradient measures 7.0 mmHg. Aortic valve peak gradient measures 10.4 mmHg. Aortic valve area, by VTI measures 2.13 cm. Pulmonic Valve: The pulmonic valve was normal in structure. Pulmonic valve regurgitation is not visualized. No evidence of pulmonic stenosis. Aorta: The aortic root is normal in size and structure. Venous: The inferior vena cava is dilated in size with greater than 50% respiratory variability, suggesting right atrial pressure of 8 mmHg. IAS/Shunts: No atrial level shunt detected by color flow Doppler.  Additional Comments: 3D was performed not requiring image post processing on an independent workstation and was indeterminate.  LEFT VENTRICLE PLAX 2D LVIDd:         5.00 cm LVIDs:         3.20 cm LV PW:         1.00 cm LV IVS:        1.10 cm LVOT diam:     1.80 cm LV SV:         79 LV SV Index:  37 LVOT Area:     2.54 cm  RIGHT VENTRICLE            IVC RV S prime:     9.68 cm/s  IVC diam: 3.00 cm TAPSE (M-mode): 1.9 cm LEFT ATRIUM             Index LA Vol (A2C):   57.5 ml 26.90 ml/m LA Vol (A4C):   76.3 ml 35.69 ml/m LA Biplane Vol: 69.7 ml 32.60 ml/m  AORTIC VALVE AV Area (Vmax):    2.10 cm AV Area (Vmean):   1.83 cm AV Area (VTI):     2.13 cm AV Vmax:           161.00 cm/s AV Vmean:          124.000 cm/s AV VTI:            0.369 m AV Peak Grad:      10.4 mmHg AV Mean Grad:      7.0 mmHg LVOT Vmax:         133.00 cm/s LVOT Vmean:        89.100 cm/s LVOT VTI:          0.309 m LVOT/AV VTI ratio: 0.84  AORTA Ao Root diam: 2.90 cm MITRAL VALVE                TRICUSPID VALVE MV Area (PHT): 3.86 cm     TR Peak grad:   44.9 mmHg MV E velocity: 109.00 cm/s  TR Vmax:        335.00 cm/s                              SHUNTS                             Systemic VTI:  0.31 m                             Systemic Diam: 1.80 cm Maude Emmer MD Electronically signed by Maude Emmer MD Signature Date/Time: 09/24/2024/3:26:54 PM    Final    MR ANGIO NECK W WO CONTRAST Result Date: 09/24/2024 EXAM: MRA Neck without and with contrast 09/24/2024 08:37:14 AM TECHNIQUE: Multiplanar multisequence MRA of the neck was performed without and with the administration of 10 mL gadobutrol  (GADAVIST ) 1 MMOL/ML injection. 2D and 3D reformatted images are provided for review. Stenosis of the internal carotid arteries is measured using NASCET criteria. COMPARISON: CTA chest dated 09/23/2024. CLINICAL HISTORY: 73 year old female with acute neck pain, no red flags. Abnormal thoracic inlet soft tissues on prior CTA chest. FINDINGS: OTHER:  Asymmetric and indistinct enhancement of the left thyroid , which is otherwise not well evaluated on this exam (series 21 image 48). AORTIC ARCH: 3 vessel aortic arch configuration. CAROTID ARTERIES: Precontrast time of flight MR angiogram images reveal antegrade fluid in the bilateral cervical carotid arteries. Carotid bifurcations appear patent and within normal limits. Postcontrast neck MR angiogram images demonstrate common carotid arteries, carotid bifurcations, cervical internal carotid arteries, and visible ICA siphons appear patent without evidence of stenosis. No dissection. No hemodynamically significant stenosis by NASCET criteria. VERTEBRAL ARTERIES: Precontrast time of flight MR angiogram images reveal antegrade fluid in the bilateral cervical vertebral arteries. The left vertebral artery appears mildly dominant. Postcontrast neck MR angiogram images demonstrate both vertebral arteries appear patent to the  vertebrobasilar junction without evidence of hemodynamically significant stenosis. Visible basilar artery is patent. No dissection. OTHER: The findings are negative for age-related significant pathology. IMPRESSION: 1. Asymmetric and indistinct enhancement of the left thyroid , not well evaluated by MRA. Dedicated Neck CT with IV contrast is the preferred first line imaging for evaluation of possible abnormal thyroid  and soft tissue at the thoracic inlet. MRI neck without and with contrast is less preferred. 2. Negative-for-age MRI angiogram of the neck. Electronically signed by: Helayne Hurst MD 09/24/2024 08:52 AM EST RP Workstation: [id number]   CT Angio Chest PE W/Cm &/Or Wo Cm Result Date: 09/23/2024 EXAM: CTA CHEST AORTA 09/23/2024 11:08:19 PM TECHNIQUE: CTA of the chest was performed after the administration of intravenous contrast. Multiplanar reformatted images are provided for review. MIP images are provided for review. Automated exposure control, iterative reconstruction, and/or weight  based adjustment of the mA/kV was utilized to reduce the radiation dose to as low as reasonably achievable. COMPARISON: None available. CLINICAL HISTORY: Pulmonary embolism (PE) suspected, high prob. FINDINGS: AORTA: Mild atherosclerotic calcification within the thoracic aorta. No aortic aneurysm. No thoracic aortic dissection. No intramural hematoma. No aortic ulceration. No rupture. MEDIASTINUM: Moderate multivessel coronary artery calcification. Mild-to-moderate cardiomegaly with right ventricular and right atrial enlargement. There is reflux with contrast at the hepatic venous system in keeping with at least some degree of right heart failure. No pericardial effusion. Central pulmonary arteries are of normal caliber. No pulmonary embolism. LYMPH NODES: No mediastinal, hilar or axillary lymphadenopathy. LUNGS AND PLEURA: Bibasilar atelectasis. Segmental air trapping within the medial and anterior segment of the right lower lobe, possibly related to small airways disease or bronchial atresia given its isolated nature. Bronchomalacia involving the bronchus intermedius best seen on image 57/12 with collapse on this expiratory phase examination. Bronchial wall thickening noted in keeping with airway inflammation. No focal consolidation or pulmonary edema. No pleural effusion or pneumothorax. UPPER ABDOMEN: Cholelithiasis partially visualized. SOFT TISSUES AND BONES: There is relatively poor delineation of the thyroid  gland with infiltrative changes surrounding the esophagus and carotid vasculature at the thoracic inlet. This appearance may be in part related to low-dose imaging and bolus timing for this radicular study, however, an infiltrative process such as anaplastic thyroid  cancer, or changes of prior radiation therapy could appear this fashion. Esophagus otherwise unremarkable. Osseous structures are age appropriate. No acute bone abnormality. No lytic or blastic bone lesion. IMPRESSION: 1. No evidence of  pulmonary embolism. 2. Mild-to-moderate cardiomegaly with right heart enlargement and reflux of contrast into the hepatic venous system, consistent with right heart failure. 3. Infiltrative changes surrounding the esophagus and carotid vasculature at the thoracic inlet with poor delineation of the thyroid ; given the possibility of an infiltrative thyroid  process with extension into the deep soft tissues of the thoracic inlet, neck CT or MRI with contrast is recommended for further evaluation. 4. Bronchoalacia involving the bronchus intermedius with collapse on this expiratory phase examination. Bronchial wall thickening in keeping with airway inflammation. Electronically signed by: Dorethia Molt MD 09/23/2024 11:32 PM EST RP Workstation: [id number]   DG Chest 2 View Result Date: 09/23/2024 CLINICAL DATA:  Cough and shortness of breath EXAM: CHEST - 2 VIEW COMPARISON:  Chest x-ray 10/12/2011 FINDINGS: The heart is enlarged. Loop recorder device again seen. No pleural effusion or pneumothorax. Both lungs are clear. The visualized skeletal structures are unremarkable. IMPRESSION: 1. No active cardiopulmonary disease. 2. Cardiomegaly. Electronically Signed   By: Greig Pique M.D.   On: 09/23/2024 20:30   CUP  PACEART REMOTE DEVICE CHECK Result Date: 09/22/2024 ILR summary report received. Battery status OK. Normal device function. No new symptom, tachy, brady, or pause episodes. Hx of PAF, controlled rates, longest duration 2hrs, burden 2.8%, on OAC per PA report.  Monthly summary reports and ROV/PRN LA, CVRS  (Echo, Carotid, EGD, Colonoscopy, ERCP)    Subjective: No complaints  Discharge Exam: Vitals:   09/26/24 2054 09/27/24 0527  BP: 137/67 114/64  Pulse:    Resp: 15 19  Temp: 98 F (36.7 C) (!) 97.3 F (36.3 C)  SpO2: 98% 100%   Vitals:   09/26/24 1203 09/26/24 2054 09/27/24 0500 09/27/24 0527  BP: 139/63 137/67  114/64  Pulse: (!) 46     Resp: 19 15  19   Temp: 97.9 F (36.6 C) 98 F  (36.7 C)  (!) 97.3 F (36.3 C)  TempSrc: Oral Oral  Oral  SpO2: 97% 98%  100%  Weight:   106.5 kg   Height:        General: Pt is alert, awake, not in acute distress Cardiovascular: RRR, S1/S2 +, no rubs, no gallops Respiratory: CTA bilaterally, no wheezing, no rhonchi Abdominal: Soft, NT, ND, bowel sounds + Extremities: no edema, no cyanosis    The results of significant diagnostics from this hospitalization (including imaging, microbiology, ancillary and laboratory) are listed below for reference.     Microbiology: Recent Results (from the past 240 hours)  Resp panel by RT-PCR (RSV, Flu A&B, Covid) Anterior Nasal Swab     Status: None   Collection Time: 09/23/24  7:59 PM   Specimen: Anterior Nasal Swab  Result Value Ref Range Status   SARS Coronavirus 2 by RT PCR NEGATIVE NEGATIVE Final    Comment: (NOTE) SARS-CoV-2 target nucleic acids are NOT DETECTED.  The SARS-CoV-2 RNA is generally detectable in upper respiratory specimens during the acute phase of infection. The lowest concentration of SARS-CoV-2 viral copies this assay can detect is 138 copies/mL. A negative result does not preclude SARS-Cov-2 infection and should not be used as the sole basis for treatment or other patient management decisions. A negative result may occur with  improper specimen collection/handling, submission of specimen other than nasopharyngeal swab, presence of viral mutation(s) within the areas targeted by this assay, and inadequate number of viral copies(<138 copies/mL). A negative result must be combined with clinical observations, patient history, and epidemiological information. The expected result is Negative.  Fact Sheet for Patients:  bloggercourse.com  Fact Sheet for Healthcare Providers:  seriousbroker.it  This test is no t yet approved or cleared by the [place]  FDA and  has been authorized for detection and/or diagnosis  of SARS-CoV-2 by FDA under an Emergency Use Authorization (EUA). This EUA will remain  in effect (meaning this test can be used) for the duration of the COVID-19 declaration under Section 564(b)(1) of the Act, 21 U.S.C.section 360bbb-3(b)(1), unless the authorization is terminated  or revoked sooner.       Influenza A by PCR NEGATIVE NEGATIVE Final   Influenza B by PCR NEGATIVE NEGATIVE Final    Comment: (NOTE) The Xpert Xpress SARS-CoV-2/FLU/RSV plus assay is intended as an aid in the diagnosis of influenza from Nasopharyngeal swab specimens and should not be used as a sole basis for treatment. Nasal washings and aspirates are unacceptable for Xpert Xpress SARS-CoV-2/FLU/RSV testing.  Fact Sheet for Patients: bloggercourse.com  Fact Sheet for Healthcare Providers: seriousbroker.it  This test is not yet approved or cleared by the [place]  FDA and has  been authorized for detection and/or diagnosis of SARS-CoV-2 by FDA under an Emergency Use Authorization (EUA). This EUA will remain in effect (meaning this test can be used) for the duration of the COVID-19 declaration under Section 564(b)(1) of the Act, 21 U.S.C. section 360bbb-3(b)(1), unless the authorization is terminated or revoked.     Resp Syncytial Virus by PCR NEGATIVE NEGATIVE Final    Comment: (NOTE) Fact Sheet for Patients: bloggercourse.com  Fact Sheet for Healthcare Providers: seriousbroker.it  This test is not yet approved or cleared by the [place]  FDA and has been authorized for detection and/or diagnosis of SARS-CoV-2 by FDA under an Emergency Use Authorization (EUA). This EUA will remain in effect (meaning this test can be used) for the duration of the COVID-19 declaration under Section 564(b)(1) of the Act, 21 U.S.C. section 360bbb-3(b)(1), unless the authorization is terminated  or revoked.  Performed at [facility], [address], [place], [place] [address]      Labs: BNP (last 3 results) No results for input(s): BNP in the last 8760 hours. Basic Metabolic Panel: Recent Labs  Lab 09/23/24 2224 09/24/24 0351 09/24/24 1355 09/26/24 0901 09/27/24 0339  NA 141 138 140 138 141  K 4.2 3.9 3.8 3.7 3.8  CL 102 103 103 99 101  CO2  --  24 27 29 30   GLUCOSE 91 108* 162* 166* 117*  BUN 21 17 18 19  25*  CREATININE 1.00 0.78 0.85 0.85 1.12*  CALCIUM   --  9.3 9.5 9.8 9.5  MG  --  2.3 2.2  --   --   PHOS  --  3.2 3.1  --   --    Liver Function Tests: Recent Labs  Lab 09/24/24 0351 09/24/24 1355  AST 34 32  ALT 26 32  ALKPHOS 70 71  BILITOT 0.6 0.7  PROT 7.7 7.6  ALBUMIN 3.8 4.0   No results for input(s): LIPASE, AMYLASE in the last 168 hours. No results for input(s): AMMONIA in the last 168 hours. CBC: Recent Labs  Lab 09/23/24 2152 09/23/24 2224 09/24/24 1355  WBC 9.9  --  8.9  NEUTROABS 7.5  --   --   HGB 11.6* 11.9* 11.2*  HCT 36.6 35.0* 34.5*  MCV 93.6  --  93.0  PLT 171  --  158   Cardiac Enzymes: Recent Labs  Lab 09/24/24 0351  CKTOTAL 134   BNP: Invalid input(s): POCBNP CBG: No results for input(s): GLUCAP in the last 168 hours. D-Dimer No results for input(s): DDIMER in the last 72 hours. Hgb A1c No results for input(s): HGBA1C in the last 72 hours. Lipid Profile No results for input(s): CHOL, HDL, LDLCALC, TRIG, CHOLHDL, LDLDIRECT in the last 72 hours. Thyroid  function studies No results for input(s): TSH, T4TOTAL, T3FREE, THYROIDAB in the last 72 hours.  Invalid input(s): FREET3 Anemia work up No results for input(s): VITAMINB12, FOLATE, FERRITIN, TIBC, IRON, RETICCTPCT in the last 72 hours. Urinalysis    Component Value Date/Time   COLORURINE YELLOW 09/24/2024 1236   APPEARANCEUR CLEAR 09/24/2024 1236   LABSPEC 1.036 (H) 09/24/2024 1236    PHURINE 5.0 09/24/2024 1236   GLUCOSEU NEGATIVE 09/24/2024 1236   HGBUR NEGATIVE 09/24/2024 1236   BILIRUBINUR NEGATIVE 09/24/2024 1236   KETONESUR NEGATIVE 09/24/2024 1236   PROTEINUR NEGATIVE 09/24/2024 1236   UROBILINOGEN 0.2 01/10/2015 1623   NITRITE NEGATIVE 09/24/2024 1236   LEUKOCYTESUR NEGATIVE 09/24/2024 1236   Sepsis Labs Recent Labs  Lab 09/23/24 2152 09/24/24 1355  WBC 9.9 8.9   Microbiology Recent Results (from the past 240 hours)  Resp panel by RT-PCR (RSV, Flu A&B, Covid) Anterior Nasal Swab     Status: None   Collection Time: 09/23/24  7:59 PM   Specimen: Anterior Nasal Swab  Result Value Ref Range Status   SARS Coronavirus 2 by RT PCR NEGATIVE NEGATIVE Final    Comment: (NOTE) SARS-CoV-2 target nucleic acids are NOT DETECTED.  The SARS-CoV-2 RNA is generally detectable in upper respiratory specimens during the acute phase of infection. The lowest concentration of SARS-CoV-2 viral copies this assay can detect is 138 copies/mL. A negative result does not preclude SARS-Cov-2 infection and should not be used as the sole basis for treatment or other patient management decisions. A negative result may occur with  improper specimen collection/handling, submission of specimen other than nasopharyngeal swab, presence of viral mutation(s) within the areas targeted by this assay, and inadequate number of viral copies(<138 copies/mL). A negative result must be combined with clinical observations, patient history, and epidemiological information. The expected result is Negative.  Fact Sheet for Patients:  bloggercourse.com  Fact Sheet for Healthcare Providers:  seriousbroker.it  This test is no t yet approved or cleared by the [place]  FDA and  has been authorized for detection and/or diagnosis of SARS-CoV-2 by FDA under an Emergency Use Authorization (EUA). This EUA will remain  in effect (meaning this test  can be used) for the duration of the COVID-19 declaration under Section 564(b)(1) of the Act, 21 U.S.C.section 360bbb-3(b)(1), unless the authorization is terminated  or revoked sooner.       Influenza A by PCR NEGATIVE NEGATIVE Final   Influenza B by PCR NEGATIVE NEGATIVE Final    Comment: (NOTE) The Xpert Xpress SARS-CoV-2/FLU/RSV plus assay is intended as an aid in the diagnosis of influenza from Nasopharyngeal swab specimens and should not be used as a sole basis for treatment. Nasal washings and aspirates are unacceptable for Xpert Xpress SARS-CoV-2/FLU/RSV testing.  Fact Sheet for Patients: bloggercourse.com  Fact Sheet for Healthcare Providers: seriousbroker.it  This test is not yet approved or cleared by the [place]  FDA and has been authorized for detection and/or diagnosis of SARS-CoV-2 by FDA under an Emergency Use Authorization (EUA). This EUA will remain in effect (meaning this test can be used) for the duration of the COVID-19 declaration under Section 564(b)(1) of the Act, 21 U.S.C. section 360bbb-3(b)(1), unless the authorization is terminated or revoked.     Resp Syncytial Virus by PCR NEGATIVE NEGATIVE Final    Comment: (NOTE) Fact Sheet for Patients: bloggercourse.com  Fact Sheet for Healthcare Providers: seriousbroker.it  This test is not yet approved or cleared by the [place]  FDA and has been authorized for detection and/or diagnosis of SARS-CoV-2 by FDA under an Emergency Use Authorization (EUA). This EUA will remain in effect (meaning this test can be used) for the duration of the COVID-19 declaration under Section 564(b)(1) of the Act, 21 U.S.C. section 360bbb-3(b)(1), unless the authorization is terminated or revoked.  Performed at [facility], [address], [place], [place] [address]      Time coordinating  discharge: Over 35 minutes  SIGNED:   Erle Odell Castor, MD  [facility] 09/27/2024, 6:35 AM Pager   If 7PM-7AM, please contact night-coverage [email or web address] Password [email or web address]

## 2024-09-27 NOTE — Progress Notes (Addendum)
 Progress Note  Patient Name: Brittney Walker Date of Encounter: 09/27/2024 [facility] Cardiologist: Jerel Balding, MD   Interval Summary   Patient reported that her shortness of breath improved significantly with breathing treatments.  Patient also reported having some melena prior to admission.  Denies having any melena since being hospitalized.  Vital Signs Vitals:   09/26/24 1203 09/26/24 2054 09/27/24 0500 09/27/24 0527  BP: 139/63 137/67  114/64  Pulse: (!) 46     Resp: 19 15  19   Temp: 97.9 F (36.6 C) 98 F (36.7 C)  (!) 97.3 F (36.3 C)  TempSrc: Oral Oral  Oral  SpO2: 97% 98%  100%  Weight:   106.5 kg   Height:        Intake/Output Summary (Last 24 hours) at 09/27/2024 0745 Last data filed at 09/27/2024 0530 Gross per 24 hour  Intake 1560 ml  Output 3350 ml  Net -1790 ml      09/27/2024    5:00 AM 09/26/2024    5:00 AM 09/25/2024    6:00 AM  Last 3 Weights  Weight (lbs) 234 lb 12.6 oz 236 lb 5.3 oz 235 lb 3.7 oz  Weight (kg) 106.5 kg 107.2 kg 106.7 kg      Telemetry/ECG  Atrial fibrillation with heart rates that ranged from 34 to 50's.- Personally Reviewed  Physical Exam  GEN: No acute distress.   Neck: Unable to assess JVD due to body habitus. Cardiac: Irregularly irregular rhythm, no murmurs, rubs, or gallops.  Respiratory: Clear to auscultation bilaterally. GI: Soft, nontender, non-distended  MS: Had compression stockings on.  Appears to have about 1+ bilateral lower extremity edema.  Assessment & Plan  Brittney Walker is a 73 y.o. female with a hx of persistent atrial fibrillation, history of direct-current cardioversion December 2022, history of anemia with large gastric ulcer, who is being seen 09/26/2024 for the evaluation of atrial fibrillation/cough/orthopnea at the request of Odell Castor, Erle, MD.   Persistent atrial fibrillation CHA2DS2-VASc Score = 4 [CHF History: 1, HTN History: 0, Diabetes History: 0, Stroke History:  0, Vascular Disease History: 1, Age Score: 1, Gender Score: 1].  Therefore, the patient's annual risk of stroke is 4.8 %.    Received ILR on 10/2021. Interrogation on 09/22/2024 showed patient had an A-fib burden of 2.8%. Is currently on Lovenox . Stop lovenox  Restart home Eliquis  5mg  BID   Second-degree type I AV block. Bradycardia. Avoid AV nodal agents. Has ILR as mentioned above.   Acute on chronic heart failure TTE on 09/24/2024 showed a normal LVEF of 55 to 60%, no RWMA, moderate left and right atrial dilation, mild MR, mild to moderate TR, poorly visualized RV systolic function, mild aortic valve calcification, no evidence of aortic stenosis, and right atrial pressure of 8 mmHg. Reported that she has had worsening lower extremity edema since September 2025.  Denies any abdominal swelling. Labs showed elevated proBNP of 1560. Pulmonary CTA on 09/23/2024 found reflux of contrast into the hepatic venous system consistent with right heart failure, bronchial wall thickening, no pulmonary edema or pleural effusion. Most recent weight was 106kgs.  Patient had a similar weight on admission.  Had a weight of 106 kgs at last 2 outpatient office visits.  I's and O's are documented as being net out 2.9 L.  Creatinine and BUN have increased today.  Suspect this is secondary to overdiuresis.  Today was transitioned from IV Lasix  to oral Lasix . Recommend outpatient sleep study. Continue 40 mg  oral Lasix  twice daily. Will need outpatient labs in 1 week. May consider RHC in outpatient setting. May consider second limited echo since RV systolic function was poorly visualized on prior echo.     Moderate multivessel CAD Hyperlipidemia Was seen on pulmonary CTA on 09/23/2024 High-sensitivity troponins negative x 2. Continue Zetia  10 mg daily. Continue Crestor  40 mg daily No need for aspirin given will be on Eliquis  for A-fib.   Iron deficiency anemia with history of gastric ulcer/H. pylori  positive Hemoglobin appears stable.  On 09/24/2024 was 11.2. Management per primary   Otherwise management per primary  For questions or updates, please contact [facility] Please consult [email or web address] for contact info under   Signed, Morse Clause, PA-C  Patient seen and examined, note reviewed with the signed Advanced Practice Provider. I personally reviewed laboratory data, imaging studies and relevant notes. I independently examined the patient and formulated the important aspects of the plan. I have personally discussed the plan with the patient and/or family. Comments or changes to the note/plan are indicated below.  Patient seen examined by her bedside.  She reports that her symptoms has improved significantly especially the shortness of breath.  She was walking around yesterday she had no concerns.  Acute on chronic heart failure with preserved ejection fraction appears to be euvolemic currently on oral diuretics Persistent atrial fibrillation slow ventricular rate Second-degree Mobitz 1  Clinically she does not appear to be significantly symptomatic.  Her shortness of breath she tells me has improved.  With this I would recommend continuing her current medical therapy with holding off AV nodal blockers in the setting of her slow ventricular rate, stop Lovenox  starting her Eliquis  twice daily. Right heart catheterization can be a consideration in the outpatient setting. No anginal symptoms continue Crestor     Brittney Seckman DO, MS [facility] Attending Cardiologist [facility]  [address] [place], [place] [address] [phone] Website: [email or web address]

## 2024-09-27 NOTE — Plan of Care (Signed)
  Problem: Health Behavior/Discharge Planning: Goal: Ability to manage health-related needs will improve 09/27/2024 1300 by Shamone Winzer, Damien SAUNDERS, RN Outcome: Adequate for Discharge 09/27/2024 1300 by Lari Damien SAUNDERS, RN Outcome: Adequate for Discharge 09/27/2024 1259 by Benino Korinek, Damien SAUNDERS, RN Outcome: Progressing   Problem: Education: Goal: Knowledge of General Education information will improve Description: Including pain rating scale, medication(s)/side effects and non-pharmacologic comfort measures 09/27/2024 1300 by Lari Damien SAUNDERS, RN Outcome: Adequate for Discharge 09/27/2024 1300 by Lari Damien SAUNDERS, RN Outcome: Adequate for Discharge 09/27/2024 1259 by Jaylon Grode, Damien SAUNDERS, RN Outcome: Progressing   Problem: Clinical Measurements: Goal: Ability to maintain clinical measurements within normal limits will improve 09/27/2024 1300 by Lari Damien SAUNDERS, RN Outcome: Adequate for Discharge 09/27/2024 1300 by Lari Damien SAUNDERS, RN Outcome: Adequate for Discharge 09/27/2024 1259 by BausDamien SAUNDERS, RN Outcome: Progressing Goal: Will remain free from infection 09/27/2024 1300 by Lari Damien SAUNDERS, RN Outcome: Adequate for Discharge 09/27/2024 1300 by Lari Damien SAUNDERS, RN Outcome: Adequate for Discharge 09/27/2024 1259 by Lari Damien SAUNDERS, RN Outcome: Progressing Goal: Diagnostic test results will improve 09/27/2024 1300 by Lari Damien SAUNDERS, RN Outcome: Adequate for Discharge 09/27/2024 1300 by Lari Damien SAUNDERS, RN Outcome: Adequate for Discharge 09/27/2024 1259 by Lari Damien SAUNDERS, RN Outcome: Progressing Goal: Respiratory complications will improve 09/27/2024 1300 by Lari Damien SAUNDERS, RN Outcome: Adequate for Discharge 09/27/2024 1300 by Lari Damien SAUNDERS, RN Outcome: Adequate for Discharge 09/27/2024 1259 by Lari Damien SAUNDERS, RN Outcome: Progressing Goal: Cardiovascular complication will be avoided 09/27/2024 1300 by Lari Damien SAUNDERS, RN Outcome: Adequate for Discharge 09/27/2024 1300 by Lari Damien SAUNDERS, RN Outcome: Adequate for  Discharge 09/27/2024 1259 by Tijuana Scheidegger, Damien SAUNDERS, RN Outcome: Progressing

## 2024-09-27 NOTE — Progress Notes (Signed)
 Heart Failure Navigator Progress Note  Assessed for Heart & Vascular TOC clinic readiness.  Patient does not meet criteria due to EF 55-60%, being discharged now, has a scheduled [facility] appointment on 11/23/2024. .   Navigator available for reassessment of patient.   Stephane Haddock, BSN, Scientist, Clinical (histocompatibility And Immunogenetics) Only

## 2024-09-27 NOTE — Plan of Care (Signed)

## 2024-09-28 ENCOUNTER — Encounter

## 2024-09-28 ENCOUNTER — Telehealth: Payer: Self-pay

## 2024-09-28 NOTE — Transitions of Care (Post Inpatient/ED Visit) (Signed)
 09/28/2024  Name: Brittney Walker MRN: [id number] DOB: Dec 13, 1950  Today's TOC FU Call Status: Today's TOC FU Call Status:: Successful TOC FU Call Completed TOC FU Call Complete Date: 09/28/24  Patient's Name and Date of Birth confirmed. Name, DOB  Transition Care Management Follow-up Telephone Call Date of Discharge: 09/27/24 Discharge Facility: [facility]) Type of Discharge: Inpatient Admission Primary Inpatient Discharge Diagnosis:: AHF How have you been since you were released from the hospital?: Better Any questions or concerns?: No  Items Reviewed: Did you receive and understand the discharge instructions provided?: Yes Medications obtained,verified, and reconciled?: Yes (Medications Reviewed) Any new allergies since your discharge?: No Dietary orders reviewed?: Yes Do you have support at home?: No  Medications Reviewed Today: Medications Reviewed Today     Reviewed by Emmitt Pan, LPN (Licensed Practical Nurse) on 09/28/24 at 1524  Med List Status: <None>   Medication Order Taking? Sig Documenting Provider Last Dose Status Informant  Ascorbic Acid (VITAMIN C) 1000 MG tablet [id number] Yes Take 1,000 mg by mouth daily. [provider]  Active Self  calcium  carbonate (OS-CAL - DOSED IN MG OF ELEMENTAL CALCIUM ) 1250 MG tablet [id number] Yes Take 1,250 mg by mouth daily with breakfast. [provider]  Active Self  Cholecalciferol (VITAMIN D3) 1000 units CAPS [id number] Yes Take 1,000 Units by mouth daily. [provider]  Active Self  ELIQUIS  5 MG TABS tablet [id number] Yes TAKE 1 TABLET BY MOUTH TWICE A DAY Croitoru, Mihai, MD  Active Self  ezetimibe  (ZETIA ) 10 MG tablet [id number] Yes Take 1 tablet (10 mg total) by mouth daily. Croitoru, Mihai, MD  Active Self  furosemide  (LASIX ) 40 MG tablet [id number] Yes Take 1 tablet (40 mg total) by mouth daily. Odell Celinda Balo, MD  Active   [facility] BILOBA PO [id number] Yes Take 1 capsule by mouth  daily. [provider]  Active Self           Med Note MARISA, NATHANEL SAILOR   Fri Sep 24, 2024  3:22 PM) Strength not stated  Multiple Vitamin (MULTIVITAMIN WITH MINERALS) TABS tablet [id number] Yes Take 1 tablet by mouth in the morning. Alive [provider]  Active Self  potassium chloride  SA (KLOR-CON  M) 20 MEQ tablet [id number] Yes Take 1 tablet (20 mEq total) by mouth daily. Croitoru, Mihai, MD  Active Self  predniSONE  (DELTASONE ) 10 MG tablet [id number] Yes Takes  3 tablets for 1 days, then 2 tabs for 1 days, then 1 tab for 1 days, and then stop. Odell Celinda Balo, MD  Active   rosuvastatin  (CRESTOR ) 40 MG tablet [id number] Yes Take 1 tablet (40 mg total) by mouth at bedtime. Paz, Jose E, MD  Active Self  vitamin A 10000 UNIT capsule [id number] Yes Take 10,000 Units by mouth daily. [provider]  Active Self  vitamin B-12 (CYANOCOBALAMIN ) 1000 MCG tablet [id number] Yes Take 1,000 mcg by mouth daily. [provider]  Active Self  vitamin E 400 UNIT capsule [id number] Yes Take 400 Units by mouth daily. [provider]  Active Self  zinc gluconate 50 MG tablet [id number] Yes Take 50 mg by mouth daily. [provider]  Active Self            Home Care and Equipment/Supplies: Were Home Health Services Ordered?: NA Any new equipment or medical supplies ordered?: NA  Functional Questionnaire: Do you need assistance with bathing/showering or dressing?: No Do you need assistance with meal preparation?: No Do you need assistance with  eating?: No Do you have difficulty maintaining continence: No Do you need assistance with getting out of bed/getting out of a chair/moving?: No Do you have difficulty managing or taking your medications?: No  Follow up appointments reviewed: PCP Follow-up appointment confirmed?: Yes Date of PCP follow-up appointment?: 10/05/24 Follow-up Provider: [facility] Follow-up appointment confirmed?:  NA Do you need transportation to your follow-up appointment?: No Do you understand care options if your condition(s) worsen?: Yes-patient verbalized understanding    SIGNATURE Julian Lemmings, LPN [facility] Nurse Health Advisor Direct Dial [phone]

## 2024-09-29 ENCOUNTER — Telehealth: Payer: Self-pay

## 2024-09-29 NOTE — Telephone Encounter (Signed)
 Copied from CRM #[id number]. Topic: General - Other >> Sep 29, 2024 12:44 PM Dedra B wrote: Reason for CRM: [facility], PT from [facility], called to report a delay in the start of care. Pt said she had a doctor's appt today. He will try to see her on Friday.

## 2024-09-29 NOTE — Telephone Encounter (Signed)
 Noted

## 2024-10-05 ENCOUNTER — Inpatient Hospital Stay: Admitting: Medical

## 2024-10-05 NOTE — Telephone Encounter (Unsigned)
 Copied from CRM #[id number]. Topic: General - Other >> Oct 05, 2024 11:44 AM Macario HERO wrote: Reason for CRM: Darryle from [facility] leaving a message that patient is delaying physical therapy for the 2nd time and he will try again tomorrow. [phone]

## 2024-10-06 ENCOUNTER — Ambulatory Visit: Admitting: Medical

## 2024-10-06 ENCOUNTER — Encounter: Payer: Self-pay | Admitting: Medical

## 2024-10-06 ENCOUNTER — Ambulatory Visit: Payer: Self-pay | Admitting: Medical

## 2024-10-06 VITALS — BP 120/80 | HR 67 | Temp 97.7°F | Resp 16 | Ht 66.5 in | Wt 242.4 lb

## 2024-10-06 DIAGNOSIS — R600 Localized edema: Secondary | ICD-10-CM | POA: Diagnosis not present

## 2024-10-06 DIAGNOSIS — D649 Anemia, unspecified: Secondary | ICD-10-CM

## 2024-10-06 DIAGNOSIS — R5383 Other fatigue: Secondary | ICD-10-CM

## 2024-10-06 DIAGNOSIS — I509 Heart failure, unspecified: Secondary | ICD-10-CM

## 2024-10-06 DIAGNOSIS — E785 Hyperlipidemia, unspecified: Secondary | ICD-10-CM

## 2024-10-06 DIAGNOSIS — I272 Pulmonary hypertension, unspecified: Secondary | ICD-10-CM

## 2024-10-06 DIAGNOSIS — I480 Paroxysmal atrial fibrillation: Secondary | ICD-10-CM

## 2024-10-06 LAB — BRAIN NATRIURETIC PEPTIDE: Pro B Natriuretic peptide (BNP): 281 pg/mL — ABNORMAL HIGH (ref 0.0–100.0)

## 2024-10-06 NOTE — Telephone Encounter (Signed)
 Called to give okay to delay start of care. LVM for [place], PT.   Copied from CRM [id number]. Topic: General - Other >> Oct 06, 2024 11:17 AM Alfonso ORN wrote: Reason for CRM: [facility] called to advise pt is requesting to delay start of care for PT for tomorrow 12/4.  Callback [phone]

## 2024-10-06 NOTE — Patient Instructions (Signed)
(  CHF)Right heart failure with pulmonary hypertension Recent hospitalization for right heart failure with pulmonary hypertension. BNP was 1560, Cardiologist follow-up scheduled for further evaluation, including right heart catheterization and sleep study. - Ordered CBC, metabolic panel, BNP, and chest x-ray to assess heart failure status. - Continue Lasix  40 mg daily. - Monitor weight daily and report any significant changes. - Follow up with cardiologist for right heart catheterization and sleep study.  Paroxysmal atrial fibrillation Managed with Eliquis  5 mg twice daily. Heart rate is low but responds to activity. - Continue Eliquis  5 mg twice daily.  Iron deficiency anemia History of iron deficiency anemia. Current status to be assessed with upcoming lab tests. - Ordered CBC to assess current anemia status.  Hyperlipidemia Managed with Crestor  and Zetia . Recent cholesterol levels were within normal limits. - Continue Crestor  and Zetia . - Advised on dietary modifications to reduce high-fat food intake.  Multinodular goiter with calcified nodules Multinodular goiter with calcified nodules noted on imaging. No significant clinical symptoms. - No further imaging recommended on last study/ct  Mild fatigue. -Tsh and t4  Follow up date to be determined after lab and imaging review.

## 2024-10-06 NOTE — Progress Notes (Signed)
 Subjective:    Patient ID: Brittney Walker, female    DOB: 1951/03/10, 73 y.o.   MRN: [id number]  HPI  Pt dc summary and hospital note below in   Admit date: 09/23/2024 Discharge date: 09/27/2024   Admitted From: Home Disposition:  Home   Recommendations for Outpatient Follow-up:  Follow up with PCP in 1-2 weeks Please obtain BMP/CBC in one week     Home Health:No Equipment/Devices:None   Discharge Condition:Stable CODE STATUS:Full Diet recommendation: Heart Healthy    Brief/Interim Summary:  73 y.o. female past medical history significant for paroxysmal atrial fibrillation on anticoagulation, Mobitz 1 AV block, right sided heart failure comes in for complaints of lower extremity swelling, chronic cough and shortness of breath, she is also been complaining of orthopnea, proBNP of 1500 CT angio of the chest showed no evidence of PE mild cardiomegaly right heart enlargement and reflux of contrast into the hepatic venous system.  It also showed infiltrative changes surrounding the esophagus and carotid vasculature at the thoracic inlet with poor delineation of the thyroid  with extension into the deep soft tissue thoracic inlet    Discharge Diagnoses:  Principal Problem:   Acute on chronic diastolic CHF (congestive heart failure) (HCC) Active Problems:   Acute on chronic diastolic (congestive) heart failure (HCC)   Hypothyroidism   PAF (paroxysmal atrial fibrillation) (HCC)   Pulmonary hypertension, unspecified (HCC)   Esophageal abnormality   Bronchitis   Second degree AV block, Mobitz type I   Chronic right heart failure (HCC)   Iron deficiency anemia   Mixed hyperlipidemia   Acute diastolic heart failure: With trace edema cardiology was consulted and was started on IV Lasix  she diuresed well. Cardiology recommended outpatient sleep study and right heart cath as an outpatient. She will continue Lasix  as an outpatient.   Multinodular goiter: CT showed multinodular  goiter with numerous thyroid  nodule which corresponds to findings on MRI. MRI of the neck showed asymmetric enhancement of the left thyroid  no vascular compromise or invasion.   Hypothyroidism: Continue Synthroid    Medication List       TAKE these medications     calcium  carbonate 1250 (500 Ca) MG tablet Commonly known as: OS-CAL - dosed in mg of elemental calcium  Take 1,250 mg by mouth daily with breakfast.    cyanocobalamin  1000 MCG tablet Commonly known as: VITAMIN B12 Take 1,000 mcg by mouth daily.    Eliquis  5 MG Tabs tablet Generic drug: apixaban  TAKE 1 TABLET BY MOUTH TWICE A DAY    ezetimibe  10 MG tablet Commonly known as: ZETIA  Take 1 tablet (10 mg total) by mouth daily.    furosemide  40 MG tablet Commonly known as: LASIX  Take 1 tablet (40 mg total) by mouth daily. What changed:  how much to take when to take this Another medication with the same name was removed. Continue taking this medication, and follow the directions you see here.    GINKGO BILOBA PO Take 1 capsule by mouth daily.    multivitamin with minerals Tabs tablet Take 1 tablet by mouth in the morning. Alive    potassium chloride  SA 20 MEQ tablet Commonly known as: KLOR-CON  M Take 1 tablet (20 mEq total) by mouth daily.    predniSONE  10 MG tablet Commonly known as: DELTASONE  Takes  3 tablets for 1 days, then 2 tabs for 1 days, then 1 tab for 1 days, and then stop.    rosuvastatin  40 MG tablet Commonly known as: CRESTOR  Take 1  tablet (40 mg total) by mouth at bedtime.    vitamin A 10000 UNIT capsule Take 10,000 Units by mouth daily.    vitamin C 1000 MG tablet Take 1,000 mg by mouth daily.    Vitamin D3 1000 units Caps Take 1,000 Units by mouth daily.    vitamin E 180 MG (400 UNITS) capsule Take 400 Units by mouth daily.    zinc gluconate 50 MG tablet Take 50 mg by mouth daily.            Brittney TAAFFE is a 73 year old female with congestive heart failure who presents  for follow-up after a recent hospital admission.  She was recently hospitalized for congestive heart failure and discharged with right-sided heart failure, paroxysmal atrial fibrillation, and pulmonary hypertension. She is now taking Eliquis  5 mg twice daily and Lasix  40 mg daily.  She is concerned about pulmonary hypertension She has no current shortness of breath and is at her baseline activity level. She sleeps in a recliner for breathing comfort and has done so for seven weeks except for days prior to recent admisstion when devleops shortness of breath.  She has a multinodular goiter with calcified nodules. Prior MRI and CT of the neck reportedly showed no need for further imaging. She has not seen an endocrinologist recently.  She has iron deficiency anemia and is awaiting current lab results. She is also concerned about her cholesterol. Her levels were normal in August and she takes Crestor  and Zetia .  Her weight increased from 234 pounds at hospital admission to 242 pounds after discharge. She monitors her weight daily and has noted a slight recent decrease.  She has no current shortness of breath. Oxygen saturation is 97%. Her heart rate rises with activity up to 67 bpm. She reports no wheezing after prior bronchitis treated with prednisone .   Review of Systems  Constitutional:  Negative for chills, fatigue and fever.  Respiratory:  Negative for cough, chest tightness, shortness of breath and wheezing.        Back to baseline before hospitalized.  Cardiovascular:  Negative for chest pain and palpitations.  Gastrointestinal:  Negative for abdominal pain.  Genitourinary:  Negative for dysuria.  Musculoskeletal:  Negative for back pain and neck pain.  Skin:  Negative for rash.  Neurological:  Negative for dizziness, weakness and numbness.  Hematological:  Negative for adenopathy. Does not bruise/bleed easily.  Psychiatric/Behavioral:  Negative for behavioral problems and decreased  concentration.     Past Medical History:  Diagnosis Date   Atrial fibrillation (HCC) 10/04/2021   Carotid bruit    w/ neg dopplers    CHF (congestive heart failure) (HCC)    History of gastric ulcer 10/04/2021   Hyperthyroidism     s/p RAI 2004   Normocytic anemia    Persistent atrial fibrillation (HCC)    a. Dx 07/2021; b. CHA2DS2VASc = 3-->Eliquis ; c. 10/2021 s/p DCCV.   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   Vitamin D  deficiency      Social History   Socioeconomic History   Marital status: Single    Spouse name: Not on file   Number of children: 0   Years of education: Not on file   Highest education level: Not on file  Occupational History   Occupation: retired---collections  Tobacco Use   Smoking status: Never   Smokeless tobacco: Never   Tobacco comments:    Never smoke 08/15/22  Vaping Use   Vaping status: Never Used  Substance and Sexual Activity   Alcohol use: No    Alcohol/week: 0.0 standard drinks of alcohol   Drug use: No   Sexual activity: Never    Comment: Virgin  Other Topics Concern   Not on file  Social History Narrative   Lives by herself    Social Drivers of Health   Financial Resource Strain: Low Risk  (08/17/2024)   Overall Financial Resource Strain (CARDIA)    Difficulty of Paying Living Expenses: Not hard at all  Food Insecurity: No Food Insecurity (09/24/2024)   Hunger Vital Sign    Worried About Running Out of Food in the Last Year: Never true    Ran Out of Food in the Last Year: Never true  Transportation Needs: No Transportation Needs (09/24/2024)   PRAPARE - Administrator, Civil Service (Medical): No    Lack of Transportation (Non-Medical): No  Physical Activity: Inactive (08/17/2024)   Exercise Vital Sign    Days of Exercise per Week: 0 days    Minutes of Exercise per Session: 0 min  Stress: No Stress Concern Present (08/17/2024)   [employer] of Occupational Health - Occupational Stress  Questionnaire    Feeling of Stress: Not at all  Social Connections: Moderately Integrated (09/24/2024)   Social Connection and Isolation Panel    Frequency of Communication with Friends and Family: More than three times a week    Frequency of Social Gatherings with Friends and Family: More than three times a week    Attends Religious Services: More than 4 times per year    Active Member of Clubs or Organizations: Yes    Attends Banker Meetings: More than 4 times per year    Marital Status: Never married  Intimate Partner Violence: Not At Risk (09/24/2024)   Humiliation, Afraid, Rape, and Kick questionnaire    Fear of Current or Ex-Partner: No    Emotionally Abused: No    Physically Abused: No    Sexually Abused: No    Past Surgical History:  Procedure Laterality Date   ABDOMINAL HYSTERECTOMY  11/05/1995   TAH-LEIOMYOMATA-- no BSO   BIOPSY  12/25/2021   Procedure: BIOPSY;  Surgeon: Legrand Victory LITTIE DOUGLAS, MD;  Location: [facility];  Service: Gastroenterology;;   BIOPSY THYROID       bx of largest nodule 2005, benign   BREAST SURGERY     x's 2--fibrocystic dz   CARDIOVERSION N/A 10/10/2021   Procedure: CARDIOVERSION;  Surgeon: Francyne Headland, MD;  Location: [facility];  Service: Cardiovascular;  Laterality: N/A;   CARDIOVERSION N/A 08/23/2022   Procedure: CARDIOVERSION;  Surgeon: Loni Soyla LABOR, MD;  Location: [facility] END[facility]COPY;  Service: Cardiovascular;  Laterality: N/A;   CATARACT EXTRACTION, BILATERAL Bilateral    COLONOSCOPY WITH PROPOFOL  N/A 12/25/2021   Procedure: COLONOSCOPY WITH PROPOFOL ;  Surgeon: Legrand Victory LITTIE DOUGLAS, MD;  Location: [facility];  Service: Gastroenterology;  Laterality: N/A;   ESOPHAGOGASTRODUODENOSCOPY (EGD) WITH PROPOFOL  N/A 10/12/2021   Procedure: ESOPHAGOGASTRODUODENOSCOPY (EGD) WITH PROPOFOL ;  Surgeon: Legrand Victory LITTIE DOUGLAS, MD;  Location: [facility];  Service: Gastroenterology;  Laterality: N/A;   ESOPHAGOGASTRODUODENOSCOPY (EGD) WITH  PROPOFOL  N/A 12/25/2021   Procedure: ESOPHAGOGASTRODUODENOSCOPY (EGD) WITH PROPOFOL ;  Surgeon: Legrand Victory LITTIE DOUGLAS, MD;  Location: [facility];  Service: Gastroenterology;  Laterality: N/A;   POLYPECTOMY  12/25/2021   Procedure: POLYPECTOMY;  Surgeon: Legrand Victory LITTIE DOUGLAS, MD;  Location: [facility];  Service: Gastroenterology;;   TONSILLECTOMY      Family History  Problem  Relation Age of Onset   Seizures Father        Epilepsy   Hypertension Father    Heart attack Father        ?   Breast cancer Sister 70   Heart disease Mother    Hypertension Mother    Colon cancer Neg Hx    Pancreatic cancer Neg Hx    Esophageal cancer Neg Hx    Prostate cancer Neg Hx    Rectal cancer Neg Hx     No Known Allergies  Current Outpatient Medications on File Prior to Visit  Medication Sig Dispense Refill   Ascorbic Acid (VITAMIN C) 1000 MG tablet Take 1,000 mg by mouth daily.     calcium  carbonate (OS-CAL - DOSED IN MG OF ELEMENTAL CALCIUM ) 1250 MG tablet Take 1,250 mg by mouth daily with breakfast.     Cholecalciferol (VITAMIN D3) 1000 units CAPS Take 1,000 Units by mouth daily.     ELIQUIS  5 MG TABS tablet TAKE 1 TABLET BY MOUTH TWICE A DAY 60 tablet 5   ezetimibe  (ZETIA ) 10 MG tablet Take 1 tablet (10 mg total) by mouth daily. 90 tablet 3   furosemide  (LASIX ) 40 MG tablet Take 1 tablet (40 mg total) by mouth daily. 30 tablet 0   GINKGO BILOBA PO Take 1 capsule by mouth daily.     Multiple Vitamin (MULTIVITAMIN WITH MINERALS) TABS tablet Take 1 tablet by mouth in the morning. Alive     potassium chloride  SA (KLOR-CON  M) 20 MEQ tablet Take 1 tablet (20 mEq total) by mouth daily. 90 tablet 1   predniSONE  (DELTASONE ) 10 MG tablet Takes  3 tablets for 1 days, then 2 tabs for 1 days, then 1 tab for 1 days, and then stop. 6 tablet 0   rosuvastatin  (CRESTOR ) 40 MG tablet Take 1 tablet (40 mg total) by mouth at bedtime. 90 tablet 1   vitamin A 10000 UNIT capsule Take 10,000 Units by mouth daily.      vitamin B-12 (CYANOCOBALAMIN ) 1000 MCG tablet Take 1,000 mcg by mouth daily.     vitamin E 400 UNIT capsule Take 400 Units by mouth daily.     zinc gluconate 50 MG tablet Take 50 mg by mouth daily.     No current facility-administered medications on file prior to visit.    BP (!) 142/78   Pulse 67   Temp 97.7 F (36.5 C) (Oral)   Resp 16   Ht 5' 6.5 (1.689 m)   Wt 242 lb 6.4 oz (110 kg)   LMP 11/05/1995   SpO2 97%   BMI 38.54 kg/m        Objective:   Physical Exam  General Mental Status- Alert. General Appearance- Not in acute distress.   Skin General: Color- Normal Color. Moisture- Normal Moisture.  Neck . No JVD.  Chest and Lung Exam Auscultation: Breath Sounds:-CTA  Cardiovascular Auscultation:Rythm- RRR Murmurs & Other Heart Sounds:Auscultation of the heart reveals- No Murmurs.  Abdomen Inspection:-Inspeection Normal. Palpation/Percussion:Note:No mass. Palpation and Percussion of the abdomen reveal- Non Tender, Non Distended + BS, no rebound or guarding.    Neurologic Cranial Nerve exam:- CN III-XII intact(No nystagmus), symmetric smile. Strength:- 5/5 equal and symmetric strength both upper and lower extremities.    Lower ext- 1+ pedal edema symmetric bilaterally. Negtive homans signs.      Assessment & Plan:   Patient Instructions  (CHF)Right heart failure with pulmonary hypertension Recent hospitalization for right heart failure with pulmonary hypertension.  BNP was 1560, Cardiologist follow-up scheduled for further evaluation, including right heart catheterization and sleep study. - Ordered CBC, metabolic panel, BNP, and chest x-ray to assess heart failure status. - Continue Lasix  40 mg daily. - Monitor weight daily and report any significant changes. - Follow up with cardiologist for right heart catheterization and sleep study.  Paroxysmal atrial fibrillation Managed with Eliquis  5 mg twice daily. Heart rate is low but responds to  activity. - Continue Eliquis  5 mg twice daily.  Iron deficiency anemia History of iron deficiency anemia. Current status to be assessed with upcoming lab tests. - Ordered CBC to assess current anemia status.  Hyperlipidemia Managed with Crestor  and Zetia . Recent cholesterol levels were within normal limits. - Continue Crestor  and Zetia . - Advised on dietary modifications to reduce high-fat food intake.  Multinodular goiter with calcified nodules Multinodular goiter with calcified nodules noted on imaging. No significant clinical symptoms. - No further imaging recommended on last study/ct  Mild fatigue. -Tsh and t4  Follow up date to be determined after lab and imaging review.   Laconda Basich, PA-C    I personally spent a total of 42 minutes in the care of the patient today including getting/reviewing separately obtained history, performing a medically appropriate exam/evaluation, counseling and educating, placing orders, and documenting clinical information in the EHR.

## 2024-10-07 ENCOUNTER — Telehealth: Payer: Self-pay

## 2024-10-07 LAB — COMPREHENSIVE METABOLIC PANEL WITH GFR
ALT: 35 U/L (ref 0–35)
AST: 24 U/L (ref 0–37)
Albumin: 4.2 g/dL (ref 3.5–5.2)
Alkaline Phosphatase: 58 U/L (ref 39–117)
BUN: 17 mg/dL (ref 6–23)
CO2: 31 meq/L (ref 19–32)
Calcium: 9.3 mg/dL (ref 8.4–10.5)
Chloride: 103 meq/L (ref 96–112)
Creatinine, Ser: 0.84 mg/dL (ref 0.40–1.20)
GFR: 68.28 mL/min (ref >60.00)
Glucose, Bld: 82 mg/dL (ref 70–99)
Potassium: 4.2 meq/L (ref 3.5–5.1)
Sodium: 142 meq/L (ref 135–145)
Total Bilirubin: 0.8 mg/dL (ref 0.2–1.2)
Total Protein: 7 g/dL (ref 6.0–8.3)

## 2024-10-07 LAB — CBC WITH DIFFERENTIAL/PLATELET
Basophils Absolute: 0.1 K/uL (ref 0.0–0.1)
Basophils Relative: 0.7 % (ref 0.0–3.0)
Eosinophils Absolute: 0.3 K/uL (ref 0.0–0.7)
Eosinophils Relative: 3 % (ref 0.0–5.0)
HCT: 36.5 % (ref 36.0–46.0)
Hemoglobin: 12.1 g/dL (ref 12.0–15.0)
Lymphocytes Relative: 13.3 % (ref 12.0–46.0)
Lymphs Abs: 1.2 K/uL (ref 0.7–4.0)
MCHC: 33.2 g/dL (ref 30.0–36.0)
MCV: 92.3 fl (ref 78.0–100.0)
Monocytes Absolute: 1 K/uL (ref 0.1–1.0)
Monocytes Relative: 11.2 % (ref 3.0–12.0)
Neutro Abs: 6.6 K/uL (ref 1.4–7.7)
Neutrophils Relative %: 71.8 % (ref 43.0–77.0)
Platelets: 133 K/uL — ABNORMAL LOW (ref 150.0–400.0)
RBC: 3.96 Mil/uL (ref 3.87–5.11)
RDW: 14.6 % (ref 11.5–15.5)
WBC: 9.2 K/uL (ref 4.0–10.5)

## 2024-10-07 LAB — T4, FREE: Free T4: 0.93 ng/dL (ref 0.60–1.60)

## 2024-10-07 LAB — TSH: TSH: 2.83 u[IU]/mL (ref 0.35–5.50)

## 2024-10-07 NOTE — Telephone Encounter (Signed)
 LMOM w/ verbal orders for PT. Informed last 3 HRs.

## 2024-10-07 NOTE — Progress Notes (Signed)
Lab letter printed and mailed.

## 2024-10-07 NOTE — Progress Notes (Signed)
 Pt called and aware wants labs and chest x ray mailed to her when everything is complete

## 2024-10-07 NOTE — Telephone Encounter (Signed)
 Copied from CRM #[id number]. Topic: Clinical - Home Health Verbal Orders >> Oct 07, 2024 10:05 AM Viola FALCON wrote: Caller/Agency: Darryle from [facility]  Callback Number: [phone] Service Requested: Physical Therapy Frequency: 2x a week for 4 weeks  Any new concerns about the patient? Yes, heart rate low 46

## 2024-10-11 NOTE — Progress Notes (Unsigned)
.     GYNECOLOGY PROGRESS NOTE  Subjective:  PCP: Amon Aloysius BRAVO, MD  Patient ID: Brittney Walker, female    DOB: [birth year], 73 y.o.   MRN: [id number]  HPI  Patient is a 73 y.o. G0P0 female who was referred by her primary care doctor for a routine gynecological visit due to a history of abnormal paps.  Last pap 12/07/2018 was NILM , no HPV testing.   History of TAH in 1997 for fibroids - no BSO.   {Common ambulatory SmartLinks:19316}  Review of Systems {ros; complete:30496}   Objective:   Last menstrual period 11/05/1995. There is no height or weight on file to calculate BMI.  General appearance: {general exam:16600} Abdomen: {abdominal exam:16834} Pelvic: {pelvic exam:16852::cervix normal in appearance,external genitalia normal,no adnexal masses or tenderness,no cervical motion tenderness,rectovaginal septum normal,uterus normal size, shape, and consistency,vagina normal without discharge} Extremities: {extremity exam:5109} Neurologic: {neuro exam:17854}   Assessment/Plan:   No diagnosis found.   There are no diagnoses linked to this encounter.     Estil Mangle, DO [facility]

## 2024-10-13 ENCOUNTER — Ambulatory Visit: Admitting: Obstetrics

## 2024-10-13 ENCOUNTER — Encounter: Payer: Self-pay | Admitting: Obstetrics

## 2024-10-13 VITALS — BP 119/60 | HR 51 | Wt 246.5 lb

## 2024-10-13 DIAGNOSIS — Z8742 Personal history of other diseases of the female genital tract: Secondary | ICD-10-CM

## 2024-10-14 ENCOUNTER — Encounter: Payer: Self-pay | Admitting: Cardiovascular Disease

## 2024-10-14 ENCOUNTER — Ambulatory Visit: Attending: Cardiovascular Disease | Admitting: Cardiovascular Disease

## 2024-10-14 VITALS — BP 130/62 | HR 46 | Ht 66.0 in | Wt 241.1 lb

## 2024-10-14 DIAGNOSIS — J40 Bronchitis, not specified as acute or chronic: Secondary | ICD-10-CM | POA: Diagnosis not present

## 2024-10-14 DIAGNOSIS — I2510 Atherosclerotic heart disease of native coronary artery without angina pectoris: Secondary | ICD-10-CM | POA: Diagnosis not present

## 2024-10-14 DIAGNOSIS — I700 Atherosclerosis of aorta: Secondary | ICD-10-CM | POA: Diagnosis not present

## 2024-10-14 DIAGNOSIS — E042 Nontoxic multinodular goiter: Secondary | ICD-10-CM

## 2024-10-14 DIAGNOSIS — I50812 Chronic right heart failure: Secondary | ICD-10-CM | POA: Diagnosis not present

## 2024-10-14 DIAGNOSIS — D500 Iron deficiency anemia secondary to blood loss (chronic): Secondary | ICD-10-CM | POA: Diagnosis not present

## 2024-10-14 DIAGNOSIS — E7800 Pure hypercholesterolemia, unspecified: Secondary | ICD-10-CM

## 2024-10-14 DIAGNOSIS — R7303 Prediabetes: Secondary | ICD-10-CM

## 2024-10-14 DIAGNOSIS — I4891 Unspecified atrial fibrillation: Secondary | ICD-10-CM

## 2024-10-14 DIAGNOSIS — I480 Paroxysmal atrial fibrillation: Secondary | ICD-10-CM

## 2024-10-14 DIAGNOSIS — Z95818 Presence of other cardiac implants and grafts: Secondary | ICD-10-CM | POA: Diagnosis not present

## 2024-10-14 DIAGNOSIS — I2721 Secondary pulmonary arterial hypertension: Secondary | ICD-10-CM | POA: Diagnosis not present

## 2024-10-14 DIAGNOSIS — R400 Somnolence: Secondary | ICD-10-CM

## 2024-10-14 DIAGNOSIS — R0683 Snoring: Secondary | ICD-10-CM | POA: Diagnosis not present

## 2024-10-14 MED ORDER — EMPAGLIFLOZIN 10 MG PO TABS: 10.0000 mg | ORAL_TABLET | Freq: Every day | ORAL | 3 refills | Status: DC

## 2024-10-14 NOTE — Patient Instructions (Signed)
 Medication Instructions:  Jardiance 10 mg every morning *If you need a refill on your cardiac medications before your next appointment, please call your pharmacy*  Lab Work: None ordered If you have labs (blood work) drawn today and your tests are completely normal, you will receive your results only by: MyChart Message (if you have MyChart) OR A paper copy in the mail If you have any lab test that is abnormal or we need to change your treatment, we will call you to review the results.  Testing/Procedures: The company will mail this to you WatchPAT?  Is a FDA cleared portable home sleep study test that uses a watch and 3 points of contact to monitor 7 different channels, including your heart rate, oxygen saturations, body position, snoring, and chest motion.  The study is easy to use from the comfort of your own home and accurately detect sleep apnea.  Before bed, you attach the chest sensor, attached the sleep apnea bracelet to your nondominant hand, and attach the finger probe.  After the study, the raw data is downloaded from the watch and scored for apnea events.   For more information: [email or web address]  Patient Testing Instructions:  Do not put battery into the device until bedtime when you are ready to begin the test. Please call the support number if you need assistance after following the instructions below: 24 hour support line- [phone] or [employer] support at [phone] (option 2)  Download the [employer] WatchPAT One app through the google play store or App Store  Be sure to turn on or enable access to bluetooth in settlings on your smartphone/ device  Make sure no other bluetooth devices are on and within the vicinity of your smartphone/ device and WatchPAT watch during testing.  Make sure to leave your smart phone/ device plugged in and charging all night.  When ready for bed:  Follow the instructions step by step in the WatchPAT One App to activate the  testing device. For additional instructions, including video instruction, visit the WatchPAT One video on Youtube. You can search for WatchPat One within Youtube (video is 4 minutes and 18 seconds) or enter: https://youtube/watch?v=BCce_vbiwxE Please note: You will be prompted to enter a Pin to connect via bluetooth when starting the test. The PIN will be assigned to you when you receive the test.  The device is disposable, but it recommended that you retain the device until you receive a call letting you know the study has been received and the results have been interpreted.  We will let you know if the study did not transmit to us  properly after the test is completed. You do not need to call us  to confirm the receipt of the test.  Please complete the test within 48 hours of receiving PIN.   Frequently Asked Questions:  What is Watch Bruna one?  A single use fully disposable home sleep apnea testing device and will not need to be returned after completion.  What are the requirements to use WatchPAT one?  The be able to have a successful watchpat one sleep study, you should have your Watch pat one device, your smart phone, watch pat one app, your PIN number and Internet access What type of phone do I need?  You should have a smart phone that uses Android 5.1 and above or any Iphone with IOS 10 and above How can I download the WatchPAT one app?  Based on your device type search for WatchPAT one app either in google play  for android devices or APP store for Iphone's Where will I get my PIN for the study?  Your PIN will be provided by your physician's office. It is used for authentication and if you lose/forget your PIN, please reach out to your providers office.  I do not have Internet at home. Can I do WatchPAT one study?  WatchPAT One needs Internet connection throughout the night to be able to transmit the sleep data. You can use your home/local internet or your cellular's data package. However, it  is always recommended to use home/local Internet. It is estimated that between 20MB-30MB will be used with each study.However, the application will be looking for space in the phone to start the study.  What happens if I lose internet or bluetooth connection?  During the internet disconnection, your phone will not be able to transmit the sleep data. All the data, will be stored in your phone. As soon as the internet connection is back on, the phone will being sending the sleep data. During the bluetooth disconnection, WatchPAT one will not be able to to send the sleep data to your phone. Data will be kept in the WatchPAT one until two devices have bluetooth connection back on. As soon as the connection is back on, WatchPAT one will send the sleep data to the phone.  How long do I need to wear the WatchPAT one?  After you start the study, you should wear the device at least 6 hours.  How far should I keep my phone from the device?  During the night, your phone should be within 15 feet.  What happens if I leave the room for restroom or other reasons?  Leaving the room for any reason will not cause any problem. As soon as your get back to the room, both devices will reconnect and will continue to send the sleep data. Can I use my phone during the sleep study?  Yes, you can use your phone as usual during the study. But it is recommended to put your watchpat one on when you are ready to go to bed.  How will I get my study results?  A soon as you completed your study, your sleep data will be sent to the provider. They will then share the results with you when they are ready.    Follow-Up: At [facility], you and your health needs are our priority.  As part of our continuing mission to provide you with exceptional heart care, our providers are all part of one team.  This team includes your primary Cardiologist (physician) and Advanced Practice Providers or APPs (Physician Assistants and Nurse  Practitioners) who all work together to provide you with the care you need, when you need it.  Your next appointment:   APP- 1 month  Dr Francyne- 6 months  We recommend signing up for the patient portal called MyChart.  Sign up information is provided on this After Visit Summary.  MyChart is used to connect with patients for Virtual Visits (Telemedicine).  Patients are able to view lab/test results, encounter notes, upcoming appointments, etc.  Non-urgent messages can be sent to your provider as well.   To learn more about what you can do with MyChart, go to [email or web address].

## 2024-10-14 NOTE — Progress Notes (Unsigned)
 Cardiology Office Note:    Date:  10/16/2024   ID:  AVERYANNA Walker, DOB 1951/07/08, MRN [id number]  PCP:  Brittney Aloysius BRAVO, MD   [facility] Providers Cardiologist:  Brittney Balding, MD Electrophysiologist:  Brittney Walker Furbish, MD     Referring MD: Brittney Aloysius BRAVO, MD   Chief Complaint  Patient presents with   Leg Swelling    History of Present Illness:    Brittney Walker is a 72 y.o. female with a hx of persistent atrial fibrillation, moderate pulmonary hypertension, right heart failure, iron deficiency anemia due to large gastric ulcer (H. pylori positive, treated), returning for follow-up.  She complains of fatigue and dyspnea on exertion.  She has to use a buggy at the grocery store and use the wheelchair to to go straight the hallways at our office today.  Leg pain is also a problem.  She has not had chest pain and she denies orthopnea or PND.  She has chronic swelling of both lower extremities, not relieved by compression stockings.  She continues to take furosemide  daily, but the swelling is not going away.  She has not had dizziness, palpitations or syncope.  Today she weighs 241 pounds, 7 pounds more than her hospital discharge weight of 234 pounds.  She was hospitalized for a few days in late November with complaints of cough and orthopnea.  CT angiogram showed no evidence of pulmonary embolism or pulmonary edema but did show signs of right heart failure as well as bronchomalacia.  There were concerns about possible infiltrative changes surrounding the esophagus and carotid vasculature at the thoracic inlet so she underwent an MRI.  This showed asymmetric enhancement of the left thyroid .  MR angiography showed no obstructive abnormalities.  CT confirmed the previously known diagnosis of multinodular goiter.  She was in atrial fibrillation.  Evaluation by EP has reported that it is unlikely that ablation would be helpful since she has a known pulmonary vein trigger for her arrhythmia.   Her loop recorder has reported a burden of atrial fibrillation around 3% which is likely an overestimation since he also has irregular rhythm due to second-degree AV block, Mobitz type I/Wenckebach cycles.  Her breathing improved in the hospital but she thought it was due to the breathing treatments rather than diuretics.  She was felt to be euvolemic at discharge at a weight of 106.5 kg (234 pounds).  Last night she had a severe ache in her right ear and excruciating headache associated with it, limited to the right side of the head.  It resolved spontaneously.  She is not currently having a headache.  She has not had dizziness or syncope.  Not aware of any palpitations.  ECG performed 09/24/2024 is hard to interpret.  It may show sinus rhythm with Wenckebach cycles versus atrial fibrillation with slow ventricular response.  Today's ECG shows atrial fibrillation.  I also think she was in atrial fibrillation on the echocardiogram performed 09/24/2024.  Echocardiogram in 2023 showed a dilated and depressed right ventricle with an estimated systolic PA pressure around 55 mmHg.  The echocardiogram performed 09/24/2024 again shows depressed right ventricular systolic function with S prime 9.68, moderately dilated right atrium and a dilated inferior vena cava with elevated the right atrial pressure.  Unfortunately the pulmonary pressure was not reported, even though mild-moderate TR was reported, but on my review the systolic PA pressure is probably in the mid 50s.  On my review there is fairly marked diastolic flattening of the  interventricular septum suggesting right ventricular volume overload.  I think she has severe tricuspid insufficiency.  LV function was normal with ejection fraction 55 to 60% and there were no other serious valvular abnormalities.  LV diastolic function was indeterminant, but the left atrium is dilated.  The rhythm was probably atrial fibrillation.  She initially underwent electrical  cardioversion on 10/11/2021 for symptomatic persistent atrial fibrillation.  Following the cardioversion she was noted to be severely anemic and underwent upper endoscopy that showed a large gastric ulcer, H. pylori positive.  Her echocardiogram showed dilated right heart chambers without evidence of left to right shunt, with mild-moderate reduced right ventricular systolic function, severely dilated right atrium and severe tricuspid regurgitation.  Labs were consistent with iron deficiency anemia.  She had recurrent persistent atrial fibrillation with slow ventricular response   and underwent cardioversion 08/23/2022.  She reported substantial improvement in energy level after return to sinus rhythm.  She is referred to EP clinic and saw Brittney Walker 01/09/2023.  He pointed out that she is likely to have nonpulmonary vein triggers related to right heart disease and recommended dofetilide for rhythm control should we choose to follow that path.  She has evidence of second-degree Mobitz type I AV block and will likely eventually need a pacemaker.  Paradoxically, she seems to have slower rates when she has atrial fibrillation than when she is in sinus rhythm.  Loop interrogation continues to show relatively frequent episodes of pauses of 3-4 seconds, never symptomatic.  Hard to say exactly what the burden of atrial fibrillation is, since many of the episodes labeled as A-fib are actually irregular rhythms due to Wenckebach cycles.  In June 2023 she also had lower extremity arterial Dopplers (ordered by Dr. Pasco Walker, her podiatrist, for pain and numbness in her legs) with no evidence of arterial obstruction.  Past Medical History:  Diagnosis Date   Atrial fibrillation (HCC) 10/04/2021   Carotid bruit    w/ neg dopplers    CHF (congestive heart failure) (HCC)    History of gastric ulcer 10/04/2021   Hyperthyroidism     s/p RAI 2004   Normocytic anemia    Persistent atrial fibrillation (HCC)    a. Dx  07/2021; b. CHA2DS2VASc = 3-->Eliquis ; c. 10/2021 s/p DCCV.   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   Vitamin D  deficiency     Past Surgical History:  Procedure Laterality Date   ABDOMINAL HYSTERECTOMY  11/05/1995   TAH-LEIOMYOMATA-- no BSO   BIOPSY  12/25/2021   Procedure: BIOPSY;  Surgeon: Legrand Victory LITTIE DOUGLAS, MD;  Location: [facility] END[facility]COPY;  Service: Gastroenterology;;   BIOPSY THYROID       bx of largest nodule 2005, benign   BREAST SURGERY     x's 2--fibrocystic dz   CARDIOVERSION N/A 10/10/2021   Procedure: CARDIOVERSION;  Surgeon: Francyne Headland, MD;  Location: [facility]OS[facility];  Service: Cardiovascular;  Laterality: N/A;   CARDIOVERSION N/A 08/23/2022   Procedure: CARDIOVERSION;  Surgeon: Loni Soyla LABOR, MD;  Location: [facility] ENDOSCOPY;  Service: Cardiovascular;  Laterality: N/A;   CATARACT EXTRACTION, BILATERAL Bilateral    COLONOSCOPY WITH PROPOFOL  N/A 12/25/2021   Procedure: COLONOSCOPY WITH PROPOFOL ;  Surgeon: Legrand Victory LITTIE DOUGLAS, MD;  Location: [facility];  Service: Gastroenterology;  Laterality: N/A;   ESOPHAGOGASTRODUODENOSCOPY (EGD) WITH PROPOFOL  N/A 10/12/2021   Procedure: ESOPHAGOGASTRODUODENOSCOPY (EGD) WITH PROPOFOL ;  Surgeon: Legrand Victory LITTIE DOUGLAS, MD;  Location: [facility] ENDOSCOPY;  Service: Gastroenterology;  Laterality: N/A;   ESOPHAGOGASTRODUODENOSCOPY (EGD) WITH PROPOFOL  N/A  12/25/2021   Procedure: ESOPHAGOGASTRODUODENOSCOPY (EGD) WITH PROPOFOL ;  Surgeon: Legrand Victory LITTIE DOUGLAS, MD;  Location: [facility] ENDOSCOPY;  Service: Gastroenterology;  Laterality: N/A;   POLYPECTOMY  12/25/2021   Procedure: POLYPECTOMY;  Surgeon: Legrand Victory LITTIE DOUGLAS, MD;  Location: [facility] ENDOSCOPY;  Service: Gastroenterology;;   TONSILLECTOMY      Current Medications: Current Meds  Medication Sig   Ascorbic Acid (VITAMIN C) 1000 MG tablet Take 1,000 mg by mouth daily.   calcium  carbonate (OS-CAL - DOSED IN MG OF ELEMENTAL CALCIUM ) 1250 MG tablet Take 1,250 mg by mouth daily with breakfast.    Cholecalciferol (VITAMIN D3) 1000 units CAPS Take 1,000 Units by mouth daily.   ELIQUIS  5 MG TABS tablet TAKE 1 TABLET BY MOUTH TWICE A DAY   empagliflozin  (JARDIANCE ) 10 MG TABS tablet Take 1 tablet (10 mg total) by mouth daily before breakfast.   ezetimibe  (ZETIA ) 10 MG tablet Take 1 tablet (10 mg total) by mouth daily.   furosemide  (LASIX ) 40 MG tablet Take 1 tablet (40 mg total) by mouth daily.   GINKGO BILOBA PO Take 1 capsule by mouth daily.   Multiple Vitamin (MULTIVITAMIN WITH MINERALS) TABS tablet Take 1 tablet by mouth in the morning. Alive   potassium chloride  SA (KLOR-CON  M) 20 MEQ tablet Take 1 tablet (20 mEq total) by mouth daily.   rosuvastatin  (CRESTOR ) 40 MG tablet Take 1 tablet (40 mg total) by mouth at bedtime.   vitamin A 10000 UNIT capsule Take 10,000 Units by mouth daily.   vitamin B-12 (CYANOCOBALAMIN ) 1000 MCG tablet Take 1,000 mcg by mouth daily.   vitamin E 400 UNIT capsule Take 400 Units by mouth daily.   zinc gluconate 50 MG tablet Take 50 mg by mouth daily.     Allergies:   Patient has no known allergies.     Family History: The patient's family history includes Breast cancer (age of onset: 72) in her sister; Heart attack in her father; Heart disease in her mother; Hypertension in her father and mother; Seizures in her father. There is no history of Colon cancer, Pancreatic cancer, Esophageal cancer, Prostate cancer, or Rectal cancer.  ROS:   Please see the history of present illness.     All other systems reviewed and are negative.  EKGs/Labs/Other Studies Reviewed:    The following studies were reviewed today: Echocardiogram 09/24/2024   1. Left ventricular ejection fraction, by estimation, is 55 to 60%. The  left ventricle has normal function. The left ventricle has no regional  wall motion abnormalities. Left ventricular diastolic parameters are  indeterminate.   2. Right ventricular systolic function was not well visualized. The right  ventricular  size is not well visualized.   3. Left atrial size was moderately dilated.   4. Right atrial size was moderately dilated.   5. The mitral valve is abnormal. Mild mitral valve regurgitation. No  evidence of mitral stenosis.   6. Tricuspid valve regurgitation is mild to moderate.   7. The aortic valve is tricuspid. There is mild calcification of the  aortic valve. There is mild thickening of the aortic valve. Aortic valve  regurgitation is not visualized. Aortic valve sclerosis is present, with  no evidence of aortic valve stenosis.   8. The inferior vena cava is dilated in size with >50% respiratory  variability, suggesting right atrial pressure of 8 mmHg.   EKG:    EKG Interpretation Date/Time:  Thursday October 14 2024 13:53:54 EST Ventricular Rate:  41 PR Interval:  QRS Duration:  86 QT Interval:  474 QTC Calculation: 391 R Axis:   -28  Text Interpretation: Atrial fibrillation with slow ventricular response Low voltage QRS Possible Anterolateral infarct , age undetermined When compared with ECG of 24-Sep-2024 13:17, Although rate has decreased , No significant change since last tracing Confirmed by Syrai Gladwin [phone]) on 10/14/2024 5:34:09 PM          Recent Labs: 09/24/2024: Magnesium 2.2 10/06/2024: ALT 35; BUN 17; Creatinine, Ser 0.84; Hemoglobin 12.1; Platelets 133.0; Potassium 4.2; Pro B Natriuretic peptide (BNP) 281.0; Sodium 142; TSH 2.83  Recent Lipid Panel    Component Value Date/Time   CHOL 169 06/04/2024 1545   TRIG 92 06/04/2024 1545   HDL 63 06/04/2024 1545   CHOLHDL 2.7 06/04/2024 1545   CHOLHDL 3.2 10/01/2023 1520   VLDL 20.0 05/26/2023 1110   LDLCALC 89 06/04/2024 1545   LDLCALC 138 (H) 10/01/2023 1520   LDLDIRECT 141.1 05/01/2009 0947     Risk Assessment/Calculations:    CHA2DS2-VASc Score = 4  The patient's score is based upon: CHF History: 1 HTN History: 0 Diabetes History: 0 Stroke History: 0 Vascular Disease History: 1 Age Score:  1 Gender Score: 1       ASSESSMENT AND PLAN: Paroxysmal Atrial Fibrillation (ICD10:  I48.0) The patient's CHA2DS2-VASc score is 4, indicating a 4.8% annual risk of stroke.    Secondary Hypercoagulable State (ICD10:  D68.69) The patient is at significant risk for stroke/thromboembolism based upon her CHA2DS2-VASc Score of 4.  Continue Apixaban  (Eliquis ).      Physical Exam:    VS:  BP 130/62 (BP Location: Left Arm, Patient Position: Sitting, Cuff Size: Large)   Pulse (!) 46   Ht 5' 6 (1.676 m)   Wt 241 lb 1.6 oz (109.4 kg)   LMP 11/05/1995   SpO2 99%   BMI 38.91 kg/m     Wt Readings from Last 3 Encounters:  10/14/24 241 lb 1.6 oz (109.4 kg)  10/13/24 246 lb 8 oz (111.8 kg)  10/06/24 242 lb 6.4 oz (110 kg)      General: Alert, oriented x3, no distress,  Head: no evidence of trauma, PERRL, EOMI, no exophtalmos or lid lag, no myxedema, no xanthelasma; normal ears, nose and oropharynx Neck: 8 cm elevation in jugular venous pulsations and prompt hepatojugular reflux; brisk carotid pulses without delay and no carotid bruits Chest: clear to auscultation, no signs of consolidation by percussion or palpation, normal fremitus, symmetrical and full respiratory excursions Cardiovascular: normal position and quality of the apical impulse, slow irregular rhythm, normal first and second heart sounds, systolic murmur at the right lower sternal border and left lower sternal border no diastolic murmurs, rubs or gallops Abdomen: no tenderness or distention, no masses by palpation, no abnormal pulsatility or arterial bruits, normal bowel sounds, no hepatosplenomegaly Extremities: 1+ symmetrical heart pitting edema of the ankles bilaterally no pretibial edema Psych: Normal mood and affect    ASSESSMENT:    1. PAF (paroxysmal atrial fibrillation) (HCC)   2. Atrial fibrillation with slow ventricular response (HCC)   3. Chronic right heart failure (HCC)   4. PAH (pulmonary artery  hypertension) (HCC)   5. Bronchitis   6. Daytime somnolence   7. Snoring   8. Status post placement of implantable loop recorder   9. Iron deficiency anemia due to chronic blood loss   10. Severe obesity (BMI 35.0-39.9) with comorbidity (HCC)   11. Coronary artery calcification seen on CT scan   12.  Aortic atherosclerosis   13. Prediabetes   14. Hypercholesterolemia   15. Multinodular goiter       PLAN:    In order of problems listed above:  Afib: She is in atrial fibrillation today and arrhythmia may be persistent now rather than paroxysmal, in the setting of what appears to be a recent heart failure exacerbation event.  She has previously undergone cardioversion and has mostly maintained normal rhythm since then.  Her loop recorder has shown a burden of atrial fibrillation of under 3% over the last year and that was probably an overestimation since many of her cycles of AV node Wenckebach-related irregularity are interpreted as atrial fibrillation by the loop recorder.  She is quite bradycardic today and in the past has always tended to have a slower rate in atrial fibrillation that she does in sinus rhythm.  She is not on any rate control medications due to second-degree AV block Mobitz type I.  Appropriately anticoagulated with Eliquis .   Underlying cause for atrial fibrillation appears to be right heart dilation and ablation is unlikely to be beneficial.  No history of thromboembolic stroke or TIA.  She does not appear to be symptomatic from the bradycardia. Second-degree AV block: She is quite bradycardic but has not had dizziness or syncope or fatigue.  There will be disadvantages related to pacemaker implantation in this patient with biventricular failure.  I am particularly worried that a pacemaker lead going across the tricuspid valve will worsen tricuspid regurgitation and right heart failure, which is her dominant problem.  May have worsening left ventricular diastolic function due  to dyssynchrony as well.  For now, continue to avoid medications with negative chronotropic effect and delay pacemaker implantation until she has clear symptoms of bradycardia. Chronic right heart failure: I think there were several inaccuracies on the recent echo report and she actually has clear evidence of right ventricular dilation and dysfunction as well as severe tricuspid insufficiency.  She does have some signs of hypervolemia today and has gained a little weight since hospital discharge (although partly this could be due to heavy winter clothes that she is wearing today).  Pulmonary pressure was moderately elevated.  Sleep study was never performed.  Severe tricuspid regurgitation is most likely secondary.  Will add SGLT2 inhibitors which will hopefully help with the edema.  Advised to watch out for groin/genital yeast infections and warned of the potential for rare Fournier's gangrene. 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  Ultimately we will have to do a right heart catheterization to clarify this.  I would like to see how she responds to the SGLT2 inhibitor first. Bronchitis/bronchomalacia: Reviewing all the records from her recent hospitalization I doubt that she was hospitalized for heart failure exacerbation (at least not left heart failure).  There was no evidence of pulmonary edema on her CT.  Her BNP was minimally elevated and not much different from her baseline.  The CT did show evidence of airway inflammatory changes and possible bronchomalacia.   Suspected OSA: The most likely driver for her pulmonary hypertension, tricuspid regurgitation, right heart failure and arrhythmia.  Need to get the sleep study that we have been planning for a while. Loop recorder: Initially placed as part of  ALLEVIATE-HF trial.  Has shown a low burden of paroxysmal atrial deficiency. Iron deficiency anemia:  This has been a problem in the past but has not recurred even though she is on uninterrupted anticoagulation with Eliquis .  Most recent hemoglobin was still normal at 12.1 during the recent hospitalization. Gastric ulcer, H. pylori positive: Was the driver for her anemia and is expected to be healed at this point after appropriate antibiotic therapy. Severe obesity: She has gained weight and not all of it is fluid.  We have previously reviewed the relationship between weight, OSA and A-fib burden.  Encouraged weight loss. Coronary calcification/aortic atherosclerosis seen on CT: Angina has never been a complaint.  Moderate multivessel coronary atherosclerosis with calcification described on the CT angiogram for pulmonary embolism performed 09/23/2024.   preDM/HLP: Target LDL cholesterol less than 70 is preferable, LDL was slightly higher at 89 on labs from August.  She is on maximum dose of rosuvastatin  plus ezetimibe .  Cost is an issue and unless we identify frank evidence of coronary artery disease, I do not think switching to a PCS K9 inhibitor is necessary.  Most recent hemoglobin A1c 6.0%.  SGLT2 inhibitor will be beneficial for prediabetes as well. Multinodular goiter: Causing a lot of confusion on the imagings studies of her neck, but asymptomatic and euthyroid both based on clinical exam and recent TSH.   Will bring her back in 1 month to follow-up on the symptoms after adding SGLT2 inhibitors and hopefully we will have the sleep study report by then as well.  Repeat an electrocardiogram at that time to see if she still in atrial fibrillation (we will also continue monitoring via her loop recorder).  It is likely that we will schedule her for right heart catheterization at some point in the future and since there is imaging evidence of coronary atherosclerosis as well, may end up planning to do a  right and left heart catheterization.  Medication Adjustments/Labs and Tests Ordered: Current medicines are reviewed at length with the patient today.  Concerns regarding medicines are outlined above.  Orders Placed This Encounter  Procedures   EKG 12-Lead   Split night study   Meds ordered this encounter  Medications   empagliflozin  (JARDIANCE ) 10 MG TABS tablet    Sig: Take 1 tablet (10 mg total) by mouth daily before breakfast.    Dispense:  90 tablet    Refill:  3    Patient Instructions  Medication Instructions:  Jardiance  10 mg every morning *If you need a refill on your cardiac medications before your next appointment, please call your pharmacy*  Lab Work: None ordered If you have labs (blood work) drawn today and your tests are completely normal, you will receive your results only by: MyChart Message (if you have MyChart) OR A paper copy in the mail If you have any lab test that is abnormal or we need to change your treatment, we will call you to review the results.  Testing/Procedures: The company will mail this to you WatchPAT?  Is a FDA cleared portable home sleep study test that uses a watch and 3 points of contact to monitor 7 different channels, including your heart rate, oxygen saturations, body position, snoring, and chest motion.  The study is easy to use from the comfort of your own home and accurately detect sleep apnea.  Before bed, you attach the chest sensor, attached the sleep apnea bracelet to your nondominant hand, and attach the finger probe.  After the study, the raw data is downloaded from the watch and scored for apnea events.   For more  information: [email or web address]  Patient Testing Instructions:  Do not put battery into the device until bedtime when you are ready to begin the test. Please call the support number if you need assistance after following the instructions below: 24 hour support line- [phone] or [employer] support  at [phone] (option 2)  Download the [employer] WatchPAT One app through the google play store or App Store  Be sure to turn on or enable access to bluetooth in settlings on your smartphone/ device  Make sure no other bluetooth devices are on and within the vicinity of your smartphone/ device and WatchPAT watch during testing.  Make sure to leave your smart phone/ device plugged in and charging all night.  When ready for bed:  Follow the instructions step by step in the WatchPAT One App to activate the testing device. For additional instructions, including video instruction, visit the WatchPAT One video on Youtube. You can search for WatchPat One within Youtube (video is 4 minutes and 18 seconds) or enter: https://youtube/[email or web address]?[email or web address]_vbiwxE Please note: You will be prompted to enter a Pin to connect via bluetooth when starting the test. The PIN will be assigned to you when you receive the test.  The device is disposable, but it recommended that you retain the device until you receive a call letting you know the study has been received and the results have been interpreted.  We will let you know if the study did not transmit to us  properly after the test is completed. You do not need to call us  to confirm the receipt of the test.  Please complete the test within 48 hours of receiving PIN.   Frequently Asked Questions:  What is Watch Br[employer] one?  A single use fully disposable home sleep apnea testing device and will not need to be returned after completion.  What are the requirements to use WatchPAT one?  The be able to have a successful watchpat one sleep study, you should have your Watch pat one device, your smart phone, watch pat one app, your PIN number and Internet access What type of phone do I need?  You should have a smart phone that uses Android 5.1 and above or any Iphone with IOS 10 and above How can I download the WatchPAT one app?  Based on your device type search for WatchPAT one app  either in google play for android devices or APP store for Iphone's Where will I get my PIN for the study?  Your PIN will be provided by your physician's office. It is used for authentication and if you lose/forget your PIN, please reach out to your providers office.  I do not have Internet at home. Can I do WatchPAT one study?  WatchPAT One needs Internet connection throughout the night to be able to transmit the sleep data. You can use your home/local internet or your cellular's data package. However, it is always recommended to use home/local Internet. It is estimated that between 20MB-30MB will be used with each study.However, the application will be looking for space in the phone to start the study.  What happens if I lose internet or bluetooth connection?  During the internet disconnection, your phone will not be able to transmit the sleep data. All the data, will be stored in your phone. As soon as the internet connection is back on, the phone will being sending the sleep data. During the bluetooth disconnection, WatchPAT one will not be able to to send the sleep data to your phone. Data  will be kept in the WatchPAT one until two devices have bluetooth connection back on. As soon as the connection is back on, WatchPAT one will send the sleep data to the phone.  How long do I need to wear the WatchPAT one?  After you start the study, you should wear the device at least 6 hours.  How far should I keep my phone from the device?  During the night, your phone should be within 15 feet.  What happens if I leave the room for restroom or other reasons?  Leaving the room for any reason will not cause any problem. As soon as your get back to the room, both devices will reconnect and will continue to send the sleep data. Can I use my phone during the sleep study?  Yes, you can use your phone as usual during the study. But it is recommended to put your watchpat one on when you are ready to go to bed.   How will I get my study results?  A soon as you completed your study, your sleep data will be sent to the provider. They will then share the results with you when they are ready.    Follow-Up: At [facility], you and your health needs are our priority.  As part of our continuing mission to provide you with exceptional heart care, our providers are all part of one team.  This team includes your primary Cardiologist (physician) and Advanced Practice Providers or APPs (Physician Assistants and Nurse Practitioners) who all work together to provide you with the care you need, when you need it.  Your next appointment:   APP- 1 month  Dr Francyne- 6 months  We recommend signing up for the patient portal called MyChart.  Sign up information is provided on this After Visit Summary.  MyChart is used to connect with patients for Virtual Visits (Telemedicine).  Patients are able to view lab/test results, encounter notes, upcoming appointments, etc.  Non-urgent messages can be sent to your provider as well.   To learn more about what you can do with MyChart, go to [email or web address].      Signed, Brittney Francyne, MD  10/16/2024 12:24 PM    [facility]

## 2024-10-22 ENCOUNTER — Ambulatory Visit

## 2024-10-22 DIAGNOSIS — I4819 Other persistent atrial fibrillation: Secondary | ICD-10-CM

## 2024-10-22 LAB — CUP PACEART REMOTE DEVICE CHECK
Date Time Interrogation Session: 20251218231215
Implantable Pulse Generator Implant Date: 20221208

## 2024-10-25 NOTE — Progress Notes (Signed)
 Remote Loop Recorder Transmission

## 2024-10-27 ENCOUNTER — Telehealth: Payer: Self-pay

## 2024-10-27 DIAGNOSIS — I480 Paroxysmal atrial fibrillation: Secondary | ICD-10-CM

## 2024-10-27 DIAGNOSIS — R400 Somnolence: Secondary | ICD-10-CM

## 2024-10-27 DIAGNOSIS — R0683 Snoring: Secondary | ICD-10-CM

## 2024-10-27 NOTE — Telephone Encounter (Signed)
 ILR alert received for brady event 10/23/2024.   Patient has hx of asymptomatic bradycardia. Routing to Dr. Francyne to advise if HR < 30 bpm for 27 seconds would be actionable.

## 2024-10-29 ENCOUNTER — Encounter

## 2024-10-29 NOTE — Telephone Encounter (Signed)
 Will continue to monitor for now. Can we please find out if she was asleep at the time of the event?

## 2024-10-30 ENCOUNTER — Ambulatory Visit: Payer: Self-pay | Admitting: Cardiovascular Disease

## 2024-11-01 NOTE — Telephone Encounter (Signed)
 Attempted to contact patient. No answer, unable to leave VM d/t option not available.

## 2024-11-02 NOTE — Telephone Encounter (Signed)
 Attempted 2nd call back, no answer. No VM option.

## 2024-11-03 NOTE — Telephone Encounter (Signed)
 Attempted 3rd call back, no answer. Unable to leave voicemail. Will send certified letter at this time.  Will continue to monitor and update accordingly.

## 2024-11-05 ENCOUNTER — Telehealth: Payer: Self-pay | Admitting: *Deleted

## 2024-11-05 NOTE — Telephone Encounter (Signed)
 Copied from CRM [id number]. Topic: Clinical - Home Health Verbal Orders >> Nov 05, 2024  4:21 PM Chasity T wrote: Caller/Agency: [facility] Callback Number: [phone] Service Requested: Physical Therapy Frequency: wants to extend physical therapy 1x3weeks Any new concerns about the patient? No

## 2024-11-08 NOTE — Telephone Encounter (Signed)
 LMOM for Darryle w/ verbal orders.

## 2024-11-10 ENCOUNTER — Telehealth: Payer: Self-pay | Admitting: Cardiovascular Disease

## 2024-11-10 NOTE — Telephone Encounter (Signed)
" °  Pt c/o medication issue:  1. Name of Medication: empagliflozin  (JARDIANCE ) 10 MG TABS tablet   2. How are you currently taking this medication (dosage and times per day)? Take 1 tablet (10 mg total) by mouth daily before breakfast.   3. Are you having a reaction (difficulty breathing--STAT)? No   4. What is your medication issue? The patient stated that she stopped taking Jardiance  last week due to a reaction. She experienced burning and redness on her skin, dizziness, and blurry vision. "

## 2024-11-10 NOTE — Telephone Encounter (Signed)
 Called pt, phone rang. Received a message sorry your call can not be completed at this time. Please try your call again later.

## 2024-11-12 ENCOUNTER — Telehealth: Payer: Self-pay

## 2024-11-12 DIAGNOSIS — Z7901 Long term (current) use of anticoagulants: Secondary | ICD-10-CM | POA: Diagnosis not present

## 2024-11-12 DIAGNOSIS — E042 Nontoxic multinodular goiter: Secondary | ICD-10-CM | POA: Diagnosis not present

## 2024-11-12 DIAGNOSIS — I5033 Acute on chronic diastolic (congestive) heart failure: Secondary | ICD-10-CM | POA: Diagnosis not present

## 2024-11-12 DIAGNOSIS — D509 Iron deficiency anemia, unspecified: Secondary | ICD-10-CM | POA: Diagnosis not present

## 2024-11-12 DIAGNOSIS — I5082 Biventricular heart failure: Secondary | ICD-10-CM | POA: Diagnosis not present

## 2024-11-12 DIAGNOSIS — E782 Mixed hyperlipidemia: Secondary | ICD-10-CM | POA: Diagnosis not present

## 2024-11-12 DIAGNOSIS — I480 Paroxysmal atrial fibrillation: Secondary | ICD-10-CM | POA: Diagnosis not present

## 2024-11-12 DIAGNOSIS — I272 Pulmonary hypertension, unspecified: Secondary | ICD-10-CM | POA: Diagnosis not present

## 2024-11-12 DIAGNOSIS — I441 Atrioventricular block, second degree: Secondary | ICD-10-CM | POA: Diagnosis not present

## 2024-11-12 DIAGNOSIS — E039 Hypothyroidism, unspecified: Secondary | ICD-10-CM | POA: Diagnosis not present

## 2024-11-12 DIAGNOSIS — J40 Bronchitis, not specified as acute or chronic: Secondary | ICD-10-CM | POA: Diagnosis not present

## 2024-11-12 DIAGNOSIS — I50812 Chronic right heart failure: Secondary | ICD-10-CM | POA: Diagnosis not present

## 2024-11-12 NOTE — Telephone Encounter (Signed)
 Plan of care signed and faxed back to [facility] at [phone]. Forms sent for scanning.

## 2024-11-18 NOTE — Telephone Encounter (Signed)
Med and allergy list updated

## 2024-11-22 ENCOUNTER — Ambulatory Visit

## 2024-11-22 DIAGNOSIS — I4819 Other persistent atrial fibrillation: Secondary | ICD-10-CM

## 2024-11-23 ENCOUNTER — Ambulatory Visit: Admitting: Internal Medicine

## 2024-11-23 ENCOUNTER — Encounter: Payer: Self-pay | Admitting: Internal Medicine

## 2024-11-23 VITALS — BP 122/72 | HR 43 | Temp 98.0°F | Resp 18 | Ht 66.0 in | Wt 235.4 lb

## 2024-11-23 DIAGNOSIS — I4891 Unspecified atrial fibrillation: Secondary | ICD-10-CM

## 2024-11-23 DIAGNOSIS — E782 Mixed hyperlipidemia: Secondary | ICD-10-CM

## 2024-11-23 DIAGNOSIS — I480 Paroxysmal atrial fibrillation: Secondary | ICD-10-CM

## 2024-11-23 DIAGNOSIS — E042 Nontoxic multinodular goiter: Secondary | ICD-10-CM | POA: Diagnosis not present

## 2024-11-23 DIAGNOSIS — Z862 Personal history of diseases of the blood and blood-forming organs and certain disorders involving the immune mechanism: Secondary | ICD-10-CM

## 2024-11-23 DIAGNOSIS — I50810 Right heart failure, unspecified: Secondary | ICD-10-CM | POA: Diagnosis not present

## 2024-11-23 DIAGNOSIS — D500 Iron deficiency anemia secondary to blood loss (chronic): Secondary | ICD-10-CM

## 2024-11-23 DIAGNOSIS — I272 Pulmonary hypertension, unspecified: Secondary | ICD-10-CM

## 2024-11-23 LAB — CUP PACEART REMOTE DEVICE CHECK
Date Time Interrogation Session: 20260118231409
Implantable Pulse Generator Implant Date: 20221208

## 2024-11-23 NOTE — Progress Notes (Unsigned)
 "  Subjective:    Patient ID: Brittney Walker, female    DOB: 01-24-1951, 74 y.o.   MRN: [id number]  DOS:  11/23/2024 Follow-up      History of Present Illness   Admitted to the hospital in November. Presented with lower extremity edema in the context of atrial fibrillation. Dx acute diastolic heart failure.  CT was negative for PE but showed no question of infiltrative process around the thyroid . MRI and CT soft tissue neck ordered: Multinodular goiter, similar to prior imaging, no follow-up needed. MRA neck essentially negative, see report  Subsequently was seen at this office by cardiology 10/14/2024: Rec to continue Eliquis , for right heart failure Rx SGLT2, pulmonary hypertension was suspected, they were considering a right heart failure. Sleep study were ordered.  Also complaining of otalgia /fullness - Attempts to clear ears have not provided relief  Cardiovascular: - Previous swelling of legs and feet due to fluid retention - Edema has resolved following recent hospitalization -No chest pain or difficulty breathing.  History of anemia: - No nausea, vomiting, diarrhea, or change in stool color    Review of Systems See above   Past Medical History:  Diagnosis Date   Atrial fibrillation (HCC) 10/04/2021   Carotid bruit    w/ neg dopplers    CHF (congestive heart failure) (HCC)    History of gastric ulcer 10/04/2021   Hyperthyroidism     s/p RAI 2004   Normocytic anemia    Persistent atrial fibrillation (HCC)    a. Dx 07/2021; b. CHA2DS2VASc = 3-->Eliquis ; c. 10/2021 s/p DCCV.   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   Vitamin D  deficiency     Past Surgical History:  Procedure Laterality Date   ABDOMINAL HYSTERECTOMY  11/05/1995   TAH-LEIOMYOMATA-- no BSO   BIOPSY  12/25/2021   Procedure: BIOPSY;  Surgeon: Legrand Victory LITTIE DOUGLAS, MD;  Location: [facility] ENDOSCOPY;  Service: Gastroenterology;;   BIOPSY THYROID       bx of largest nodule 2005, benign    BREAST SURGERY     x's 2--fibrocystic dz   CARDIOVERSION N/A 10/10/2021   Procedure: CARDIOVERSION;  Surgeon: Francyne Headland, MD;  Location: [facility] ENDOSCOPY;  Service: Cardiovascular;  Laterality: N/A;   CARDIOVERSION N/A 08/23/2022   Procedure: CARDIOVERSION;  Surgeon: Loni Soyla LABOR, MD;  Location: [facility] ENDOSCOPY;  Service: Cardiovascular;  Laterality: N/A;   CATARACT EXTRACTION, BILATERAL Bilateral    COLONOSCOPY WITH PROPOFOL  N/A 12/25/2021   Procedure: COLONOSCOPY WITH PROPOFOL ;  Surgeon: Legrand Victory LITTIE DOUGLAS, MD;  Location: [facility] ENDOSCOPY;  Service: Gastroenterology;  Laterality: N/A;   ESOPHAGOGASTRODUODENOSCOPY (EGD) WITH PROPOFOL  N/A 10/12/2021   Procedure: ESOPHAGOGASTRODUODENOSCOPY (EGD) WITH PROPOFOL ;  Surgeon: Legrand Victory LITTIE DOUGLAS, MD;  Location: [facility] ENDOSCOPY;  Service: Gastroenterology;  Laterality: N/A;   ESOPHAGOGASTRODUODENOSCOPY (EGD) WITH PROPOFOL  N/A 12/25/2021   Procedure: ESOPHAGOGASTRODUODENOSCOPY (EGD) WITH PROPOFOL ;  Surgeon: Legrand Victory LITTIE DOUGLAS, MD;  Location: [facility] ENDOS[facility];  Service: Gastroenterology;  Laterality: N/A;   POLYPECTOMY  12/25/2021   Procedure: POLYPECTOMY;  Surgeon: Legrand Victory LITTIE DOUGLAS, MD;  Location: [facility] ENDOSCOPY;  Service: Gastroenterology;;   TONSILLECTOMY      Current Outpatient Medications  Medication Instructions   calcium  carbonate (OS-CAL - DOSED IN MG OF ELEMENTAL CALCIUM ) 1,250 mg, Daily with breakfast   cyanocobalamin  (VITAMIN B12) 1,000 mcg, Daily   Eliquis  5 mg, Oral, 2 times daily   ezetimibe  (ZETIA ) 10 mg, Oral, Daily   furosemide  (LASIX ) 40 mg, Oral, Daily   GINKGO BILOBA PO  1 capsule, Daily   Multiple Vitamin (MULTIVITAMIN WITH MINERALS) TABS tablet 1 tablet, Every morning   potassium chloride  SA (KLOR-CON  M) 20 MEQ tablet 20 mEq, Oral, Daily   rosuvastatin  (CRESTOR ) 40 mg, Oral, Daily at bedtime   vitamin A 10,000 Units, Daily   vitamin C 1,000 mg, Daily   Vitamin D3 1,000 Units, Daily   vitamin E 400 Units, Daily   zinc gluconate  50 mg, Daily       Objective:   Physical Exam BP 122/72   Pulse (!) 43   Temp 98 F (36.7 C) (Oral)   Resp 18   Ht 5' 6 (1.676 m)   Wt 235 lb 6 oz (106.8 kg)   LMP 11/05/1995   SpO2 95%   BMI 37.99 kg/m  General:   Well developed, NAD, BMI noted. HEENT:  Normocephalic . Face symmetric, atraumatic. Left ear: Normal Right ear: Cerumen impaction Lungs:  CTA B Normal respiratory effort, no intercostal retractions, no accessory muscle use. Heart: Bradycardic Lower extremities: Has compression stockings, trace edema noted   Skin: Not pale. Not jaundice Neurologic:  alert & oriented X3.  Speech normal, gait appropriate for age and unassisted Psych--  Cognition and judgment appear intact.  Cooperative with normal attention span and concentration.  Behavior appropriate. No anxious or depressed appearing.      Assessment     ASSESSMENT CV: ---A- Fibrillation,Dx 07-2021, cardioverted 10/11/2021, 08/23/2022. ---CHF Dx 07-2021 ---Carotid bruit w/ negative Dopplers Gout Thyroid  disease,  -RAI 2004; Bx 2005 neg;US  10-2014-- new nodule, Bx unsuccessful 12-2014;US  May 2023 nodules stable Morbid obesity  H/o  VIN I,  2000 Vitamin D  deficiency Acute anemia due to H. pylori + gastric ulcer 10/2021   PLAN A-fib, second-degree AV block, R heart failure, PAH, Presented with lower extremity edema in the context of atrial fibrillation. Dx acute diastolic heart failure. CT was negative for PE but showed no question of infiltrative process around the thyroid . MRI and CT soft tissue neck ordered: Multinodular goiter, similar to prior imaging, no follow-up needed. MRA neck essentially negative, see report Subsequently was seen at this office by cardiology 10/14/2024: Rec to continue Eliquis , for right heart failure Rx SGLT2, pulmonary hypertension was suspected, they were considering a right heart failure. Sleep study were ordered. At this point, she seems to be doing well, she stopped   SGLT2's due to a rash. Recommend to see cardiology as recommended. Hyperlipidemia: Last LDL 63, on rosuvastatin  and Zetia  History of IDA, H. pylori: Denies GI symptoms, no evidence of bleeding.  Last hemoglobin satisfactory. RTC 6 months   "

## 2024-11-23 NOTE — Patient Instructions (Signed)
 Please read your instructions carefully.     Go to the front desk for the checkout Please make an appointment for a checkup in 6 months.  Call me sooner if needed.

## 2024-11-24 ENCOUNTER — Encounter: Payer: Self-pay | Admitting: Nurse Practitioner

## 2024-11-24 ENCOUNTER — Ambulatory Visit: Attending: Nurse Practitioner | Admitting: Nurse Practitioner

## 2024-11-24 VITALS — BP 116/62 | HR 45 | Ht 66.0 in | Wt 231.0 lb

## 2024-11-24 DIAGNOSIS — I50812 Chronic right heart failure: Secondary | ICD-10-CM

## 2024-11-24 DIAGNOSIS — I2510 Atherosclerotic heart disease of native coronary artery without angina pectoris: Secondary | ICD-10-CM | POA: Diagnosis not present

## 2024-11-24 DIAGNOSIS — I2721 Secondary pulmonary arterial hypertension: Secondary | ICD-10-CM

## 2024-11-24 DIAGNOSIS — I441 Atrioventricular block, second degree: Secondary | ICD-10-CM

## 2024-11-24 DIAGNOSIS — I361 Nonrheumatic tricuspid (valve) insufficiency: Secondary | ICD-10-CM | POA: Diagnosis not present

## 2024-11-24 DIAGNOSIS — R7303 Prediabetes: Secondary | ICD-10-CM | POA: Diagnosis not present

## 2024-11-24 DIAGNOSIS — I4819 Other persistent atrial fibrillation: Secondary | ICD-10-CM

## 2024-11-24 DIAGNOSIS — E785 Hyperlipidemia, unspecified: Secondary | ICD-10-CM | POA: Diagnosis not present

## 2024-11-24 NOTE — Progress Notes (Addendum)
 "  Office Visit    Patient Name: Brittney Walker Date of Encounter: 11/24/2024  Primary Care Provider:  Amon Aloysius BRAVO, MD Primary Cardiologist:  Jerel Balding, MD  Chief Complaint    74 year old female with a history of coronary artery calcification, persistent atrial fibrillation, moderate pulmonary hypertension, right-sided heart failure, second-degree AV block, hyperlipidemia, prediabetes, bronchomalacia, suspected OSA, obesity, multinodular goiter, and iron deficiency anemia due to large gastric ulcer (H. pylori positive, treated) who presents for follow-up related to heart failure and atrial fibrillation.  Past Medical History    Past Medical History:  Diagnosis Date   Atrial fibrillation (HCC) 10/04/2021   Carotid bruit    w/ neg dopplers    CHF (congestive heart failure) (HCC)    History of gastric ulcer 10/04/2021   Hyperthyroidism     s/p RAI 2004   Normocytic anemia    Persistent atrial fibrillation (HCC)    a. Dx 07/2021; b. CHA2DS2VASc = 3-->Eliquis ; c. 10/2021 s/p DCCV.   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   Vitamin D  deficiency    Past Surgical History:  Procedure Laterality Date   ABDOMINAL HYSTERECTOMY  11/05/1995   TAH-LEIOMYOMATA-- no BSO   BIOPSY  12/25/2021   Procedure: BIOPSY;  Surgeon: Legrand Victory LITTIE DOUGLAS, MD;  Location: [facility];  Service: Gastroenterology;;   BIOPSY THYROID       bx of largest nodule 2005, benign   BREAST SURGERY     x's 2--fibrocystic dz   CARDIOVERSION N/A 10/10/2021   Procedure: CARDIOVERSION;  Surgeon: Balding Jerel, MD;  Location: [facility];  Service: Cardiovascular;  Laterality: N/A;   CARDIOVERSION N/A 08/23/2022   Procedure: CARDIOVERSION;  Surgeon: Loni Soyla LABOR, MD;  Location: [facility] ENDOSCOPY;  Service: Cardiovascular;  Laterality: N/A;   CATARACT EXTRACTION, BILATERAL Bilateral    COLONOSCOPY WITH PROPOFOL  N/A 12/25/2021   Procedure: COLONOSCOPY WITH PROPOFOL ;  Surgeon: Legrand Victory LITTIE DOUGLAS, MD;   Location: [facility];  Service: Gastroenterology;  Laterality: N/A;   ESOPHAGOGASTRODUODENOSCOPY (EGD) WITH PROPOFOL  N/A 10/12/2021   Procedure: ESOPHAGOGASTRODUODENOSCOPY (EGD) WITH PROPOFOL ;  Surgeon: Legrand Victory LITTIE DOUGLAS, MD;  Location: [facility] ENDOSCOPY;  Service: Gastroenterology;  Laterality: N/A;   ESOPHAGOGASTRODUODENOSCOPY (EGD) WITH PROPOFOL  N/A 12/25/2021   Procedure: ESOPHAGOGASTRODUODENOSCOPY (EGD) WITH PROPOFOL ;  Surgeon: Legrand Victory LITTIE DOUGLAS, MD;  Location: [facility];  Service: Gastroenterology;  Laterality: N/A;   POLYPECTOMY  12/25/2021   Procedure: POLYPECTOMY;  Surgeon: Legrand Victory LITTIE DOUGLAS, MD;  Location: [facility]OS[facility];  Service: Gastroenterology;;   TONSILLECTOMY      Allergies  Allergies[1]   Labs/Other Studies Reviewed    The following studies were reviewed today:  Cardiac Studies & Procedures   ______________________________________________________________________________________________     ECHOCARDIOGRAM  ECHOCARDIOGRAM COMPLETE 09/24/2024  Narrative ECHOCARDIOGRAM REPORT    Patient Name:   Brittney Walker Date of Exam: 09/24/2024 Medical Rec #:  [id number]        Height:       66.0 in Accession #:    [id number]       Weight:       234.0 lb Date of Birth:  08-21-1951        BSA:          2.138 m Patient Age:    73 years         BP:           130/59 mmHg Patient Gender: F  HR:           54 bpm. Exam Location:  Inpatient  Procedure: 2D Echo, Cardiac Doppler and Color Doppler (Both Spectral and Color Flow Doppler were utilized during procedure).  Indications:    CHF-Acute Diastolic I50.31  History:        Patient has prior history of Echocardiogram examinations, most recent 03/04/2023. CHF.  Sonographer:    Jayson Gaskins Referring Phys: [id number] ANASTASSIA DOUTOVA  IMPRESSIONS   1. Left ventricular ejection fraction, by estimation, is 55 to 60%. The left ventricle has normal function. The left ventricle has no regional wall motion  abnormalities. Left ventricular diastolic parameters are indeterminate. 2. Right ventricular systolic function was not well visualized. The right ventricular size is not well visualized. 3. Left atrial size was moderately dilated. 4. Right atrial size was moderately dilated. 5. The mitral valve is abnormal. Mild mitral valve regurgitation. No evidence of mitral stenosis. 6. Tricuspid valve regurgitation is mild to moderate. 7. The aortic valve is tricuspid. There is mild calcification of the aortic valve. There is mild thickening of the aortic valve. Aortic valve regurgitation is not visualized. Aortic valve sclerosis is present, with no evidence of aortic valve stenosis. 8. The inferior vena cava is dilated in size with >50% respiratory variability, suggesting right atrial pressure of 8 mmHg.  FINDINGS Left Ventricle: Left ventricular ejection fraction, by estimation, is 55 to 60%. The left ventricle has normal function. The left ventricle has no regional wall motion abnormalities. Strain was performed and the global longitudinal strain is indeterminate. The left ventricular internal cavity size was normal in size. There is no left ventricular hypertrophy. Left ventricular diastolic parameters are indeterminate.  Right Ventricle: The right ventricular size is not well visualized. No increase in right ventricular wall thickness. Right ventricular systolic function was not well visualized.  Left Atrium: Left atrial size was moderately dilated.  Right Atrium: Right atrial size was moderately dilated.  Pericardium: There is no evidence of pericardial effusion.  Mitral Valve: The mitral valve is abnormal. There is mild thickening of the mitral valve leaflet(s). There is mild calcification of the mitral valve leaflet(s). Mild mitral valve regurgitation. No evidence of mitral valve stenosis.  Tricuspid Valve: The tricuspid valve is normal in structure. Tricuspid valve regurgitation is mild to  moderate. No evidence of tricuspid stenosis.  Aortic Valve: The aortic valve is tricuspid. There is mild calcification of the aortic valve. There is mild thickening of the aortic valve. Aortic valve regurgitation is not visualized. Aortic valve sclerosis is present, with no evidence of aortic valve stenosis. Aortic valve mean gradient measures 7.0 mmHg. Aortic valve peak gradient measures 10.4 mmHg. Aortic valve area, by VTI measures 2.13 cm.  Pulmonic Valve: The pulmonic valve was normal in structure. Pulmonic valve regurgitation is not visualized. No evidence of pulmonic stenosis.  Aorta: The aortic root is normal in size and structure.  Venous: The inferior vena cava is dilated in size with greater than 50% respiratory variability, suggesting right atrial pressure of 8 mmHg.  IAS/Shunts: No atrial level shunt detected by color flow Doppler.  Additional Comments: 3D was performed not requiring image post processing on an independent workstation and was indeterminate.   LEFT VENTRICLE PLAX 2D LVIDd:         5.00 cm LVIDs:         3.20 cm LV PW:         1.00 cm LV IVS:        1.10 cm LVOT  diam:     1.80 cm LV SV:         79 LV SV Index:   37 LVOT Area:     2.54 cm   RIGHT VENTRICLE            IVC RV S prime:     9.68 cm/s  IVC diam: 3.00 cm TAPSE (M-mode): 1.9 cm  LEFT ATRIUM             Index LA Vol (A2C):   57.5 ml 26.90 ml/m LA Vol (A4C):   76.3 ml 35.69 ml/m LA Biplane Vol: 69.7 ml 32.60 ml/m AORTIC VALVE AV Area (Vmax):    2.10 cm AV Area (Vmean):   1.83 cm AV Area (VTI):     2.13 cm AV Vmax:           161.00 cm/s AV Vmean:          124.000 cm/s AV VTI:            0.369 m AV Peak Grad:      10.4 mmHg AV Mean Grad:      7.0 mmHg LVOT Vmax:         133.00 cm/s LVOT Vmean:        89.100 cm/s LVOT VTI:          0.309 m LVOT/AV VTI ratio: 0.84  AORTA Ao Root diam: 2.90 cm  MITRAL VALVE                TRICUSPID VALVE MV Area (PHT): 3.86 cm     TR Peak  grad:   44.9 mmHg MV E velocity: 109.00 cm/s  TR Vmax:        335.00 cm/s  SHUNTS Systemic VTI:  0.31 m Systemic Diam: 1.80 cm  Maude Emmer MD Electronically signed by Maude Emmer MD Signature Date/Time: 09/24/2024/3:26:54 PM    Final          ______________________________________________________________________________________________     Recent Labs: 09/24/2024: Magnesium 2.2 10/06/2024: ALT 35; BUN 17; Creatinine, Ser 0.84; Hemoglobin 12.1; Platelets 133.0; Potassium 4.2; Pro B Natriuretic peptide (BNP) 281.0; Sodium 142; TSH 2.83  Recent Lipid Panel    Component Value Date/Time   CHOL 169 06/04/2024 1545   TRIG 92 06/04/2024 1545   HDL 63 06/04/2024 1545   CHOLHDL 2.7 06/04/2024 1545   CHOLHDL 3.2 10/01/2023 1520   VLDL 20.0 05/26/2023 1110   LDLCALC 89 06/04/2024 1545   LDLCALC 138 (H) 10/01/2023 1520   LDLDIRECT 141.1 05/01/2009 0947    History of Present Illness    73 year old female with the above past medical history including coronary artery calcification, persistent atrial fibrillation, moderate pulmonary hypertension, right-sided heart failure, mitral valve regurgitation, tricuspid valve regurgitation, second-degree AV block, hyperlipidemia, prediabetes, bronchomalacia, suspected OSA, obesity, multinodular goiter, and iron deficiency anemia due to large gastric ulcer (H. pylori positive, treated).   History of atrial fibrillation, s/p loop recorder, this was initially placed as part of the alleviate-HF trial.  She underwent DCCV in 10/2021.   Echocardiogram at the time showed dilated right heart chambers without evidence of left to right shunt, with mild-moderate reduced right ventricular systolic function, severely dilated right atrium and severe tricuspid regurgitation. She underwent repeat DCCV in 08/2022.  She was evaluated by EP in 01/2023.  Overall felt to be a poor candidate for ablation as it was noted that she was likely to have nonpulmonary vein  triggers for atrial fibrillation related to right heart disease. She was  hospitalized in November 2025 in the setting of acute on chronic diastolic heart failure, she was diuresed appropriately. Echocardiogram at the time  showed EF 55 to 60%, normal LV function, no RWMA, normal RV systolic function, moderate BAE, mild mitral valve regurgitation, mild to moderate tricuspid valve regurgitation. She was last seen in the office on 10/14/2024 and reported increased fatigue, dyspnea on exertion, bilateral lower extremity edema.  She was started on Jardiance .  However, this was discontinued due to adverse reaction (burning and redness on skin, dizziness, blurry vision).  It was noted that she would likely benefit from [facility] at some point in the future.  Outpatient sleep study was again recommended. ILR interrogation on 11/21/2024 showed normal device function, 30 new A-fib episodes, longest duration 1 hour 44 minutes, rate controlled, 2.7% a fib burden, 1 episode of bradycardia consistent with atrial fibrillation with slow ventricular response, 27 seconds in duration, HR less than 30 bpm.   She presents today for follow-up.  Since her last visit she has done well from a cardiac standpoint.  She denies any chest pain, palpitations, dizziness, dyspnea, edema, PND, orthopnea, or weight gain.  Overall, she reports feeling well.  Home Medications    Current Outpatient Medications  Medication Sig Dispense Refill   Ascorbic Acid (VITAMIN C) 1000 MG tablet Take 1,000 mg by mouth daily.     calcium  carbonate (OS-CAL - DOSED IN MG OF ELEMENTAL CALCIUM ) 1250 MG tablet Take 1,250 mg by mouth daily with breakfast.     Cholecalciferol (VITAMIN D3) 1000 units CAPS Take 1,000 Units by mouth daily.     ELIQUIS  5 MG TABS tablet TAKE 1 TABLET BY MOUTH TWICE A DAY 60 tablet 5   ezetimibe  (ZETIA ) 10 MG tablet Take 1 tablet (10 mg total) by mouth daily. 90 tablet 3   furosemide  (LASIX ) 40 MG tablet Take 1 tablet (40 mg total) by  mouth daily. 30 tablet 0   Multiple Vitamin (MULTIVITAMIN WITH MINERALS) TABS tablet Take 1 tablet by mouth in the morning. Alive     potassium chloride  SA (KLOR-CON  M) 20 MEQ tablet Take 1 tablet (20 mEq total) by mouth daily. 90 tablet 1   rosuvastatin  (CRESTOR ) 40 MG tablet Take 1 tablet (40 mg total) by mouth at bedtime. 90 tablet 1   vitamin A 10000 UNIT capsule Take 10,000 Units by mouth daily.     vitamin B-12 (CYANOCOBALAMIN ) 1000 MCG tablet Take 1,000 mcg by mouth daily.     vitamin E 400 UNIT capsule Take 400 Units by mouth daily.     zinc gluconate 50 MG tablet Take 50 mg by mouth daily.     GINKGO BILOBA PO Take 1 capsule by mouth daily.     No current facility-administered medications for this visit.     Review of Systems    She denies chest pain, palpitations, dyspnea, pnd, orthopnea, n, v, dizziness, syncope, edema, weight gain, or early satiety. All other systems reviewed and are otherwise negative except as noted above.   Physical Exam    VS:  BP 116/62 (Cuff Size: Large)   Pulse (!) 45   Ht 5' 6 (1.676 m)   Wt 231 lb (104.8 kg)   LMP 11/05/1995   SpO2 96%   BMI 37.28 kg/m  , BMI Body mass index is 37.28 kg/m. STOP-Bang Score:  5 (Simultaneous filing. User may not have seen previous data.)      GEN: Well nourished, well developed, in no acute distress. HEENT: normal.  Neck: Supple, no JVD, carotid bruits, or masses. Cardiac: IRIR, no murmurs, rubs, or gallops. No clubbing, cyanosis, edema.  Radials/DP/PT 2+ and equal bilaterally.  Respiratory:  Respirations regular and unlabored, clear to auscultation bilaterally. GI: Soft, nontender, nondistended, BS + x 4. MS: no deformity or atrophy. Skin: warm and dry, no rash. Neuro:  Strength and sensation are intact. Psych: Normal affect.  Accessory Clinical Findings    ECG personally reviewed by me today - EKG Interpretation Date/Time:  Wednesday November 24 2024 15:41:01 EST Ventricular Rate:  45 PR Interval:     QRS Duration:  88 QT Interval:  448 QTC Calculation: 387 R Axis:   149  Text Interpretation: Atrial fibrillation with slow ventricular response Right axis deviation Low voltage QRS Cannot rule out Anterior infarct (cited on or before 14-Oct-2024) When compared with ECG of 14-Oct-2024 13:53, No significant change since Confirmed by Daneen Perkins ([id number]) on 11/24/2024 3:49:30 PM  - no acute changes.   Lab Results  Component Value Date   WBC 9.2 10/06/2024   HGB 12.1 10/06/2024   HCT 36.5 10/06/2024   MCV 92.3 10/06/2024   PLT 133.0 (L) 10/06/2024   Lab Results  Component Value Date   CREATININE 0.84 10/06/2024   BUN 17 10/06/2024   NA 142 10/06/2024   K 4.2 10/06/2024   CL 103 10/06/2024   CO2 31 10/06/2024   Lab Results  Component Value Date   ALT 35 10/06/2024   AST 24 10/06/2024   ALKPHOS 58 10/06/2024   BILITOT 0.8 10/06/2024   Lab Results  Component Value Date   CHOL 169 06/04/2024   HDL 63 06/04/2024   LDLCALC 89 06/04/2024   LDLDIRECT 141.1 05/01/2009   TRIG 92 06/04/2024   CHOLHDL 2.7 06/04/2024    Lab Results  Component Value Date   HGBA1C 6.0 08/17/2024    Assessment & Plan    1. Persistent atrial fibrillation: S/p DCCV in 2022 and 2023.  Previously evaluated by EP. Overall felt to be a poor candidate for ablation as it was noted that she was likely to have nonpulmonary vein triggers for atrial fibrillation related to right heart disease.  Recent ILR interrogation revealed 30 new episodes of A-fib, 2.7% burden rate controlled.  EKG today shows atrial fibrillation with slow ventricular response.  Asymptomatic.  Continue Eliquis .  2. Second-degree AV block: Recently noted that she would likely not be considered a good for pacemaker implantation in the setting of biventricular failure, tricuspid valve regurgitation, concern for worsening LV dysfunction due to dyssynchrony.  She remains asymptomatic.  Continue to avoid medications with negative chronotropic  effects.   3. Right sided heart failure/pulmonary hypertension: Echocardiogram in 09/2024 showed EF 55 to 60%, normal LV function, no RWMA, normal RV systolic function, moderate BAE, mild mitral valve regurgitation, mild to moderate tricuspid valve regurgitation. Euvolemic and well compensated on exam. She did not tolerate Jardiance  due to adverse reaction (burning and redness of skin, dizziness, blurry vision). Euvolemic, compensated on exam.  Previously noted by Dr. Francyne that she would likely benefit from right/left heart cath at some point in the future, can revisit at follow-up.  Will update CMET.  Continue Lasix .  4. Coronary artery calcification: Noted on prior CT. Stable with no anginal symptoms. No indication for ischemic evaluation. Continue Crestor , Zetia .  5. Valvular heart disease: Most recent echo showed mild mitral valve regurgitation, mild to moderate tricuspid valve regurgitation as above. Euvolemic and well compensated on exam.  Continue Lasix .  6.  Hyperlipidemia: LDL was 89 in 06/2024.  Will update fasting lipid panel, CMET.  Continue Crestor , Zetia .  7. Prediabetes: A1c was 6.0 in 08/2024.  Monitored and managed per PCP.  8. Suspected OSA/obesity: Pending sleep study.  He has not yet received a call to schedule this.  We will follow-up on the status of this request.  9. Disposition: Follow up in 3 to 4 months with Dr. Francyne.      Damien JAYSON Braver, NP 11/24/2024, 4:37 PM       [1]  Allergies Allergen Reactions   Jardiance  [Empagliflozin ] Rash and Other (See Comments)    Redness, rash, dizziness, blurred vision   "

## 2024-11-24 NOTE — Assessment & Plan Note (Signed)
 A-fib, second-degree AV block, R heart failure, PAH, Presented with lower extremity edema in the context of atrial fibrillation. Dx acute diastolic heart failure. CT was negative for PE but showed no question of infiltrative process around the thyroid . MRI and CT soft tissue neck ordered: Multinodular goiter, similar to prior imaging, no follow-up needed. MRA neck essentially negative, see report Subsequently was seen at this office by cardiology 10/14/2024: Rec to continue Eliquis , for right heart failure Rx SGLT2, pulmonary hypertension was suspected, they were considering a right heart failure. Sleep study were ordered. At this point, she seems to be doing well, she stopped  SGLT2's due to a rash. Recommend to see cardiology as recommended. Hyperlipidemia: Last LDL 63, on rosuvastatin  and Zetia  History of IDA, H. pylori: Denies GI symptoms, no evidence of bleeding.  Last hemoglobin satisfactory. RTC 6 months

## 2024-11-24 NOTE — Patient Instructions (Signed)
 Medication Instructions:  Your physician recommends that you continue on your current medications as directed. Please refer to the Current Medication list given to you today.  *If you need a refill on your cardiac medications before your next appointment, please call your pharmacy*  Lab Work: Fasting lipid panel & CMET at your convenience   Testing/Procedures: NONE ordered at this time of appointment   Follow-Up: At [facility], you and your health needs are our priority.  As part of our continuing mission to provide you with exceptional heart care, our providers are all part of one team.  This team includes your primary Cardiologist (physician) and Advanced Practice Providers or APPs (Physician Assistants and Nurse Practitioners) who all work together to provide you with the care you need, when you need it.  Your next appointment:   3-4 month(s)  Provider:   Jerel Balding, MD or           We recommend signing up for the patient portal called MyChart.  Sign up information is provided on this After Visit Summary.  MyChart is used to connect with patients for Virtual Visits (Telemedicine).  Patients are able to view lab/test results, encounter notes, upcoming appointments, etc.  Non-urgent messages can be sent to your provider as well.   To learn more about what you can do with MyChart, go to [email or web address].   Other Instructions

## 2024-11-25 NOTE — Telephone Encounter (Signed)
 Patient states she was asleep during that 839am brady event on 10/23/24.  No concerns will continue to monitor.   Also, patient asks that Dr. Tyrone nurse follow up with her about her sleep study test.  She was suppose to have at [facility] however they told her that it got cancelled and would need to be put in again.   I let patient know I would forward to Dr. Fanny nurse to follow up.

## 2024-11-25 NOTE — Addendum Note (Signed)
 Addended by: AMON ALOYSIUS BRAVO on: 11/25/2024 03:34 PM   Modules accepted: Level of Service

## 2024-11-26 NOTE — Progress Notes (Signed)
 Remote Loop Recorder Transmission

## 2024-11-28 ENCOUNTER — Ambulatory Visit: Payer: Self-pay | Admitting: Cardiovascular Disease

## 2024-11-29 ENCOUNTER — Encounter

## 2024-11-29 NOTE — Addendum Note (Signed)
 Addended by: AMON SCHANZ E on: 11/29/2024 02:37 PM   Modules accepted: Level of Service

## 2024-11-30 ENCOUNTER — Telehealth: Payer: Self-pay

## 2024-11-30 NOTE — Telephone Encounter (Signed)
 Remote transmitter has not connected since 11/27/2023. Also noted on presenting, VS 40's.   Will call patient to assist with connection of monitor and assess for any symptoms.

## 2024-11-30 NOTE — Telephone Encounter (Signed)
 Called pt X 2- Call cannot be completed at this time   Wanted to let the patient know that the Split night sleep study has been re-ordered- for [name]

## 2024-11-30 NOTE — Addendum Note (Signed)
 Addended by: DAVEE IZETTA CROME on: 11/30/2024 09:00 AM   Modules accepted: Orders

## 2024-11-30 NOTE — Telephone Encounter (Signed)
 Would you mind calling patient to see if she can reconnect her remote monitor? After connected please send back to triage.

## 2024-11-30 NOTE — Telephone Encounter (Signed)
 Spoke with friend Steen and she will relay msg to pt to give us  a call back and send a remote transmission

## 2024-12-02 NOTE — Telephone Encounter (Signed)
 Pt called back letting us  know that she is out of town and does not have monitor with her. Pt will connect when she gets back home

## 2024-12-08 NOTE — Telephone Encounter (Signed)
 Transmission received.  Normal device function.  AF with SVR, HR's 30-40's

## 2024-12-08 NOTE — Telephone Encounter (Signed)
 Thanks, looks like her usual bradycardia, which to date has been asymptomatic.

## 2024-12-23 ENCOUNTER — Ambulatory Visit

## 2025-01-23 ENCOUNTER — Ambulatory Visit

## 2025-02-23 ENCOUNTER — Ambulatory Visit

## 2025-03-25 ENCOUNTER — Ambulatory Visit: Admitting: Cardiovascular Disease

## 2025-03-26 ENCOUNTER — Ambulatory Visit

## 2025-04-26 ENCOUNTER — Ambulatory Visit

## 2025-05-24 ENCOUNTER — Ambulatory Visit: Admitting: Internal Medicine

## 2025-05-27 ENCOUNTER — Ambulatory Visit

## 2025-06-27 ENCOUNTER — Ambulatory Visit

## 2025-07-28 ENCOUNTER — Ambulatory Visit

## 2025-08-23 ENCOUNTER — Ambulatory Visit

## 2025-08-28 ENCOUNTER — Ambulatory Visit
